# Patient Record
Sex: Male | Born: 1970 | Race: White
Health system: Southern US, Community
[De-identification: ages and names within clinical notes are randomized; demographics above are authoritative.]

## PROBLEM LIST (undated history)

## (undated) DIAGNOSIS — J45909 Unspecified asthma, uncomplicated: Secondary | ICD-10-CM

## (undated) DIAGNOSIS — I10 Essential (primary) hypertension: Secondary | ICD-10-CM

## (undated) DIAGNOSIS — E7800 Pure hypercholesterolemia, unspecified: Secondary | ICD-10-CM

## (undated) DIAGNOSIS — I2510 Atherosclerotic heart disease of native coronary artery without angina pectoris: Secondary | ICD-10-CM

## (undated) DIAGNOSIS — R45851 Suicidal ideations: Secondary | ICD-10-CM

## (undated) DIAGNOSIS — G8929 Other chronic pain: Secondary | ICD-10-CM

## (undated) DIAGNOSIS — E162 Hypoglycemia, unspecified: Secondary | ICD-10-CM

## (undated) DIAGNOSIS — D649 Anemia, unspecified: Secondary | ICD-10-CM

## (undated) DIAGNOSIS — I219 Acute myocardial infarction, unspecified: Secondary | ICD-10-CM

## (undated) DIAGNOSIS — I249 Acute ischemic heart disease, unspecified: Secondary | ICD-10-CM

## (undated) DIAGNOSIS — R011 Cardiac murmur, unspecified: Secondary | ICD-10-CM

## (undated) DIAGNOSIS — R1030 Lower abdominal pain, unspecified: Secondary | ICD-10-CM

## (undated) DIAGNOSIS — R1031 Right lower quadrant pain: Secondary | ICD-10-CM

## (undated) HISTORY — PX: OTHER SURGICAL HISTORY: SHX169

## (undated) HISTORY — PX: ORIF SCAPULAR FRACTURE: SUR958

## (undated) HISTORY — PX: BACK SURGERY: SHX140

## (undated) HISTORY — PX: KNEE ARTHROSCOPY: SHX127

## (undated) HISTORY — PX: OTHER PROCEDURE: U1053

## (undated) HISTORY — PX: BRAIN SURGERY: SHX531

## (undated) MED ORDER — IBUPROFEN 600 MG OR TABS
600.00 mg | ORAL_TABLET | Freq: Once | ORAL | Status: AC
Start: 2013-10-12 — End: 2013-10-12

## (undated) MED ORDER — LISINOPRIL 20 MG OR TABS
20.00 mg | ORAL_TABLET | Freq: Every day | ORAL | Status: AC
Start: 2012-09-14 — End: ?

## (undated) NOTE — ED Provider Notes (Signed)
 Formatting of this note is different from the original.  [name] [name]  [address]  [place] [address]  [phone]    History  Chief Complaint   Patient presents with    Chest Pain     52 year old male presents with a chief complaint of chest pain.  Patient has extensive history of multiple ER visits across the country with similar complaints.  Patient states that he was seen at [facility] earlier in the day for AFib and was cardioverted.  The patient also states recently seen at [facility] as well.  Patient states he has chest pain that goes into his jaw and arm with a history of cardiac disease.  Patient denies shortness of breath.    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    Respiratory:  Negative for shortness of breath.    Cardiovascular:  Positive for chest pain.   Gastrointestinal:  Negative for vomiting.   Psychiatric/Behavioral:  Negative for suicidal ideas.    All other systems reviewed and are negative.    Physical Exam  BP 105/67   Pulse 93   Temp 98.7 F (37.1 C) (Temporal)   Resp 20   Ht 6\' 1"  (1.854 m)   Wt 81.6 kg (180 lb)   SpO2 97%   BMI 23.75 kg/m     Physical Exam  Vitals and nursing note reviewed.   Constitutional:       Appearance: He is well-developed.   HENT:      Head: Normocephalic.   Eyes:      Pupils:  Pupils are equal, round, and reactive to light.   Cardiovascular:      Rate and Rhythm: Normal rate and regular rhythm.      Heart sounds: Normal heart sounds.   Pulmonary:      Effort: Pulmonary effort is normal.      Breath sounds: Normal breath sounds.   Abdominal:      Palpations: Abdomen is soft.      Tenderness: There is no abdominal tenderness.   Musculoskeletal:         General: Normal range of motion.      Cervical back: Normal range of motion and neck supple.   Skin:     General: Skin is warm and dry.      Capillary Refill: Capillary refill takes less than 2 seconds.   Neurological:      General: No focal deficit present.      Mental Status: He  is alert.   Psychiatric:      Comments: Denies suicidal ideation     Results  Labs Reviewed   TROPONIN I - Normal       Result Value    HS Troponin I 7      Narrative:     High-sensitivity Troponin I (hsTnI) assay can reliably detect low troponin concentrations relative to conventional troponin assays. It is the preferred marker of myocardial necrosis as recommended by the Fourth Universal Definition of Myocardial Infarction Guidelines.    The diagnosis of acute myocardial infarction (AMI) is made based on a rise or fall of troponin with at least one measurement exceeding the laboratory's upper limit of normal(indicating myocardial injury), in the context of reasonable suspicion for coronary ischemia (e.g. typical symptoms, changes on ECG, evidence for loss of myocardial function or demonstration of obstructive coronary artery disease).    NOTE: Although abnormal hsTnI values reflect INJURY to myocardial cells, an elevated hsTnI does not indicate the CAUSE of injury (i.e. ischemia versus non-ischemic disease).    In some cases, myocardial injury is chronic and relatively stable such that hsTnI values remain elevated but do not change substantially over hours to days (examples include chronic kidney disease, heart failure or advanced patient age).    When determining  whether there has been a significant rise or fall of troponin on serial sampling, absolute change in troponin concentration has greater diagnostic accuracy for AMI than relative change criteria. A change of >7 ng/L over a 2-hour interval or a change of >10 ng/L over a 3-hour interval is suggested as a significant change.  If the initial hsTnI is below or slightly above the 99th percentile upper reference limit, a 50% change from the baseline is considered significant.  If the initial hsTnI is above the 99th percentile upper reference limit, a 20% change from the baseline value is considered significant.     Imaging  Imaging Results    None      ED Course  ED Course as of 05/22/23 1738   Fri May 22, 2023   1650 After getting consent from the patient I called [name] [name] and spoke with the ER provider who reviewed the patient's record from earlier in the day and states the patient was not cardioverted and was in sinus while he was in the ER although he had a reported history of AFib with a RVR in the field.  Patient had 2- troponins, unremarkable CBC, chemistry and chest x-ray at [facility] earlier today [SC]   1737 Patient reassessed in his sleeping peacefully.  Troponin negative, EKG unremarkable [SC]     ED Course User Index  [SC] [name] Langton, MD     Procedures    MDM  Number of Diagnoses or Management Options  Chest pain  Diagnosis management comments:   Relevant history includes: Refer to HPI and review of systems, chest pain, multiple ER visits recently    Differential diagnosis included:  Chest pain AC'S, malingering    Comorbidities affecting management:  Heart disease, AFib    Social history and social determinants of care that are affecting care:  Orthoptist, substance abuse, cigarette smoking    Prior medical records review and external chart review:  Multiple visits for various complaints throughout the country, visits for suicidal ideation, manipulative behavior at hospital and speak of four days ago  reviewed records from [place], EKG, troponin x2 negative, chemistry, CBC, chest x-ray negative    Independent historian(s) and additional history  obtained from:  EMS    Consultations and case reviewed:  Not applicable    Case discussed with admitting provider:  Not applicable    PMP reviewed with findings:  Not applicable    EKG/rhythm strip reviewed:  Normal sinus rhythm with a rate of 87  EKG/rhythm strip independently reviewed and interpreted by me and shows:  EKG time 4:38 p.m., normal sinus rhythm with a rate 87, normal axis, normal intervals, normal ST segments, no STEMI    Lab results reviewed by me and significant results include:  Troponin negative    Radiology studies independently reviewed and interpreted by me in coordination with radiologist official interpretation:  Not applicable    Considered and unordered tests/meds:  Not applicable    Shared decision making and disposition considerations:    medically and hemodynamically stable at time of disposition. With shared decision making, discussed plan to discharge patient home with medication and follow up with the physicians as noted below. Patient instructed on the importance of ensuring that this follow up occurs as instructed. Patient given verbal instructions at time of discharge in addition to written discharge instructions. Strict ED return precautions given. Patient voiced understanding and agreement with plan. All questions answered.    Multiple visits for chest pain, negative troponin x3 today.  Reviewed the prior records.  Clinically doubt acute coronary syndrome.  Resting comfortably in no distress on reassessment.  Stable for discharge    ED Disposition  Discharge    Final diagnoses:   Chest pain      Follow-up Information       [facility] In 2 days.    Contact information  [address]  [place] [address]  [phone]                      [name] Langton, MD  05/22/23 [phone]    Electronically signed by [name] Langton,  MD at 05/22/2023  3:38 PM PST

## (undated) NOTE — Unmapped External Note (Signed)
 Formatting of this note might be different from the original.  Brought by EMS,[place] patient having chest pain.EMS notice a patch Maximilliano on chest and back which patient have been cardioverted 2 days ago in [place] and found out that patient went [facility] this morning and discharge him which patient did not elaborate.EMS got normal sinus rhythm and given aspirin 324 mg.    Pt complains chest pain radiates to jaw and left arm and rapid heart rate started half hour ago,dizziness and nausea.known 3 heart attack no cardiac stent,stroke 2024.had syncope episode 2 months ago,taking xarelto known afib  Electronically signed by Drema Balzarine, RN at 05/22/2023  2:36 PM PST

---

## 2006-05-13 ENCOUNTER — Emergency Department: Payer: Self-pay

## 2006-05-13 ENCOUNTER — Emergency Department (HOSPITAL_BASED_OUTPATIENT_CLINIC_OR_DEPARTMENT_OTHER): Admitting: Emergency Medicine

## 2006-05-13 ENCOUNTER — Other Ambulatory Visit (INDEPENDENT_AMBULATORY_CARE_PROVIDER_SITE_OTHER): Payer: Self-pay | Admitting: Emergency Medicine

## 2006-05-13 LAB — CBC WITH DIFF, BLOOD
Basophils: 1 % (ref 0–2)
Eosinophils: 2 % (ref 1–3)
Hct: 42.2 % (ref 40.0–50.0)
Hgb: 14.6 gm/dL (ref 14.0–17.0)
Lymphocytes: 28 % (ref 20–40)
MCH: 30.5 pg (ref 27–31)
MCHC: 34.6 % (ref 32–37)
MCV: 88.2 um3 (ref 82.0–98.0)
MPV: 8.2 fl (ref 7.4–10.4)
Monocytes: 9 % (ref 1–10)
Plt Count: 292 10*3/uL (ref 130–400)
RBC: 4.78 10*6/uL (ref 4.70–5.70)
RDW: 14.4 % (ref 10–15)
Segs: 60 % (ref 45–70)
WBC: 8 10*3/uL (ref 4.0–11.0)

## 2006-05-13 LAB — TROPONIN I (THORNTON), BLOOD: Troponin I (TH): <0.2 ng/mL (ref <0.5)

## 2006-05-13 LAB — COMPREHENSIVE METABOLIC PANEL, BLOOD
ALT (SGPT): 20 IU/L (ref 10–45)
AST (SGOT): 17 IU/L (ref 10–45)
Albumin: 3.8 gm/dL (ref 3.3–5.0)
Alkaline Phos: 76 IU/L (ref 30–130)
BUN: 20 mg/dL — ABNORMAL HIGH (ref 8–18)
Bicarbonate: 26 mEq/L (ref 24–31)
Bilirubin, Tot: 0.7 mg/dL (ref <1.2)
Calcium: 9 mg/dL (ref 8.8–10.3)
Chloride: 105 mEq/L (ref 97–107)
Creatinine: 0.9 mg/dL (ref 0.5–1.5)
GFR (African Amer.): >60 mL/min
GFR: >60 mL/min
Glucose: 102 mg/dL (ref 65–110)
Potassium: 3.9 mEq/L (ref 3.5–5.0)
Sodium: 143 mEq/L (ref 135–145)
Total Protein: 6 gm/dL (ref 6.0–8.0)

## 2006-05-13 LAB — PROTHROMBIN TIME, BLOOD
INR: 1.2
PT,Patient: 10.5 s — ABNORMAL HIGH (ref 7.0–10.0)

## 2006-05-13 LAB — CPK-CREATINE PHOSPHOKINASE, BLOOD: CPK: 168 IU/L (ref 0–175)

## 2006-05-13 LAB — APTT, BLOOD: PTT: 32.8 s (ref 25.0–34.0)

## 2006-05-13 LAB — CK-MB (THORNTON), BLOOD: CK-MB: 2.8 ng/mL (ref ?–6.3)

## 2006-05-15 LAB — ECG, COMPLETE (HC/~~LOC~~/ENCINITAS)
ATRIAL RATE: 80 {beats}/min
ECG INTERPRETATION: NORMAL
P AXIS: 71 degrees
PR INTERVAL: 150 ms
QRS INTERVAL/DURATION: 100 ms
QT: 370 ms
QTC INTERVAL: 427 ms
R AXIS: 63 degrees
T AXIS: 12 degrees
VENTRICULAR RATE: 80 {beats}/min

## 2007-01-12 ENCOUNTER — Observation Stay
Admission: EM | Admit: 2007-01-12 | Discharge status: Against Medical Advice | Payer: Self-pay | Source: Emergency Department | Admitting: Internal Medicine

## 2007-01-12 LAB — I-STAT TROPONIN CERNER: i-STAT Troponin: 0.01 ng/mL

## 2007-01-12 LAB — CBC WITH AUTO DIFFERENTIAL CERNER
Basophils Absolute: 0.1 /mm3 (ref 0.0–0.2)
Basophils: 1 % (ref 0–2)
Eosinophils Absolute: 0.2 /mm3 (ref 0.0–0.7)
Eosinophils: 2 % (ref 0–5)
Granulocytes Absolute: 4.9 /mm3 (ref 1.8–8.1)
Hematocrit: 41.2 % — ABNORMAL LOW (ref 42.0–52.0)
Hgb: 14.4 G/DL (ref 13.0–17.0)
Lymphocytes Absolute: 1.6 /mm3 (ref 0.5–4.4)
Lymphocytes: 22 % (ref 15–41)
MCH: 30.5 PG (ref 28.0–32.0)
MCHC: 35 G/DL (ref 32.0–36.0)
MCV: 87.2 FL (ref 80.0–100.0)
MPV: 7.8 FL (ref 7.4–10.4)
Monocytes Absolute: 0.6 /mm3 (ref 0.0–1.2)
Monocytes: 9 % (ref 0–11)
Neutrophils %: 66 % (ref 52–75)
Platelets: 289 /mm3 (ref 140–400)
RBC: 4.73 /mm3 (ref 4.70–6.00)
RDW: 13.4 % (ref 11.5–15.0)
WBC: 7.5 /mm3 (ref 3.5–10.8)

## 2007-01-12 LAB — COMPREHENSIVE METABOLIC PANEL
ALT: 26 U/L (ref 3–36)
AST (SGOT): 20 U/L (ref 10–41)
Albumin/Globulin Ratio: 1.8 (ref 1.1–1.8)
Albumin: 4.1 g/dL (ref 3.4–4.9)
Alkaline Phosphatase: 89 U/L (ref 43–112)
BUN: 15 mg/dL (ref 8–20)
Bilirubin, Total: 0.1 mg/dL (ref 0.1–1.0)
CO2: 23 mEq/L (ref 21–30)
Calcium: 9 mg/dL (ref 8.6–10.2)
Chloride: 107 mEq/L (ref 98–107)
Creatinine: 0.9 mg/dL (ref 0.6–1.5)
Globulin: 2.3 g/dL (ref 2.0–3.7)
Glucose: 96 mg/dL (ref 70–100)
Potassium: 4.2 mEq/L (ref 3.6–5.0)
Protein, Total: 6.4 g/dL (ref 6.0–8.0)
Sodium: 140 mEq/L (ref 136–146)

## 2007-01-12 LAB — PT AND APTT
PT INR: 1.2 {INR} — ABNORMAL HIGH (ref 0.9–1.1)
PT: 13.6 s — ABNORMAL HIGH (ref 10.8–13.3)
PTT: 30 s (ref 21–32)

## 2007-01-12 LAB — TROPONIN I QUANTITATIVE LEVEL CERNER: Troponin I: 0 ng/mL

## 2007-01-12 LAB — AMYLASE: Amylase: 32 U/L (ref 5–90)

## 2007-01-12 LAB — CKMB
CKMB Confirm: 4 ng/mL (ref 0–5)
CKMB Confirm: 4 ng/mL (ref 0–5)

## 2007-01-12 LAB — CK
Creatine Kinase (CK): 249 U/L (ref 20–297)
Creatine Kinase (CK): 221 U/L (ref 20–297)

## 2007-01-12 LAB — GFR

## 2007-01-12 LAB — LIPASE: Lipase: 110 U/L (ref 32–219)

## 2007-01-13 ENCOUNTER — Emergency Department
Admission: EM | Admit: 2007-01-13 | Discharge status: Against Medical Advice | Payer: Self-pay | Source: Emergency Department | Admitting: Emergency Medicine

## 2007-01-13 LAB — URINALYSIS WITH MICROSCOPIC
Bilirubin, UA: NEGATIVE
Blood, UA: NEGATIVE
Glucose, UA: NEGATIVE
Ketones UA: NEGATIVE
Leukocyte Esterase, UA: NEGATIVE
Nitrite, UA: NEGATIVE
Protein, UR: NEGATIVE
Specific Gravity UA POCT: 1.022 (ref 1.001–1.035)
Urine pH: 7 (ref 5.0–8.0)
Urobilinogen, UA: 0.2

## 2007-01-13 LAB — CK: Creatine Kinase (CK): 172 U/L (ref 20–297)

## 2007-01-13 LAB — RAPID DRUG SCREEN, URINE: Opiate Screen, UR: POSITIVE

## 2007-01-13 LAB — TROPONIN I QUANTITATIVE LEVEL CERNER: Troponin I: 0 ng/mL

## 2007-01-14 LAB — BASIC METABOLIC PANEL
BUN / Creatinine Ratio: 20 (ref 8–20)
BUN: 19 mg/dL (ref 6–20)
CO2: 24 mmol/L (ref 21.0–31.0)
Calcium: 9.8 mg/dL (ref 8.4–10.2)
Chloride: 101 mmol/L (ref 101–111)
Creatinine: 0.96 mg/dL (ref 0.5–1.4)
EGFR: >60 mL/min/{1.73_m2}
EGFR: >60 mL/min/{1.73_m2}
Glucose: 89 mg/dL (ref 70–100)
Potassium: 4.3 mmol/L (ref 3.6–5.0)
Sodium: 140 mmol/L (ref 135–145)

## 2007-01-14 LAB — D-DIMER, QUANTITATIVE: D-Dimer, Quant.: 329 ng/mL

## 2007-01-14 LAB — ^CBC WITH DIFF MCKESSON
BASOPHILS %: 0.8 % (ref 0–2)
Baso(Absolute): 0.1
Eosinophils %: 2.4 % (ref 0–6)
Eosinophils Absolute: 0.2
Hematocrit: 46.3 % (ref 39.0–49.0)
Hemoglobin: 15.8 g/dL (ref 13.2–17.3)
Lymphocytes Absolute: 2.5
Lymphocytes Relative: 24.9 % — ABNORMAL LOW (ref 25–55)
MCH: 30 pg (ref 27.0–34.0)
MCHC: 34.1 % (ref 32.0–36.0)
MCV: 88 fL (ref 80–100)
Monocytes Absolute: 0.8
Monocytes Relative %: 7.6 % (ref 1–8)
Neutrophils Absolute: 6.3
Neutrophils Relative %: 64.3 % (ref 49–69)
Platelets: 319 10*3/uL (ref 150–400)
RBC: 5.26 /mm3 (ref 3.80–5.40)
RDW: 12.4 % (ref 11.0–14.0)
WBC: 9.9 10*3/uL (ref 4.8–10.8)

## 2007-01-14 LAB — TOXICOLOGY SCREEN, SERUM
Barbiturate Screen, UR: NEGATIVE
Benzodiazepine Screen, UR: NEGATIVE
Cocaine Screen: NEGATIVE
Opiate Screen: POSITIVE
PCP Screen: NEGATIVE
THC Screen: NEGATIVE
Urine Amphetamine Screen: NEGATIVE

## 2007-01-14 LAB — HEPATIC FUNCTION PANEL
ALT: 30 U/L (ref 7–56)
AST (SGOT): 22 U/L (ref 5–40)
Albumin, Synovial: 4.9 g/dL (ref 3.9–5.0)
Alkaline Phosphatase: 104 U/L (ref 38–126)
Bilirubin Direct: 0 mg/dL (ref 0.0–0.4)
Bilirubin, Total: 0.3 mg/dL (ref 0.2–1.3)
Protein, Total: 7.3 g/dL (ref 6.3–8.2)

## 2007-01-14 LAB — TROPONIN I: Troponin I: <0.01 ng/mL (ref 0.000–0.034)

## 2007-01-14 LAB — MAGNESIUM: Magnesium: 1.9 mg/dL (ref 1.7–2.2)

## 2007-01-14 LAB — ^CKMB MCKESSON
CKMB Index: 1.2 % (ref 0.0–2.5)
CKMB Mass: 2.92 ng/mL (ref 0.00–3.38)

## 2007-01-14 LAB — LIPASE: Lipase: 86 U/L (ref 23–300)

## 2007-01-14 LAB — PT (PROTHROMBIN TIME)
PT INR: 1
PT: 11.7 s (ref 10.6–12.8)

## 2007-01-14 LAB — MYOGLOBIN, SERUM: Myoglobin: 42 ng/mL (ref 0–121)

## 2007-01-14 LAB — PTT (PARTIAL THROMBOPLASTIN TIME)
PTT Ratio: 1.1 (ref 0.8–1.2)
PTT: 30.1 s (ref 21.6–34.0)

## 2007-01-14 LAB — CK: Creatine Kinase (CK): 248 U/L — ABNORMAL HIGH (ref 19–216)

## 2008-03-25 ENCOUNTER — Observation Stay
Admission: EM | Admit: 2008-03-25 | Discharge status: Against Medical Advice | Payer: Self-pay | Source: Ambulatory Visit | Admitting: Internal Medicine

## 2010-04-18 ENCOUNTER — Inpatient Hospital Stay
Admit: 2010-04-19 | Disposition: A | Discharge status: Other Acute Inpatient Hospital | Admitting: Cardiovascular Disease

## 2010-04-19 LAB — BASIC METABOLIC PANEL
BUN: 14 mg/dl (ref 7–22)
BUN: 13 mg/dl (ref 7–22)
CO2: 26 meq/l (ref 23–33)
CO2: 23 meq/l (ref 23–33)
Calcium: 9.5 mg/dl (ref 8.5–10.5)
Calcium: 8.7 mg/dl (ref 8.5–10.5)
Chloride: 107 meq/l (ref 98–111)
Chloride: 111 meq/l (ref 98–111)
Creatinine: 0.9 mg/dl (ref 0.4–1.2)
Creatinine: 0.7 mg/dl (ref 0.4–1.2)
Glucose: 129 mg/dl — ABNORMAL HIGH (ref 70–108)
Glucose: 109 mg/dl — ABNORMAL HIGH (ref 70–108)
Potassium: 3.7 meq/l (ref 3.5–5.2)
Potassium: 4 meq/l (ref 3.5–5.2)
Sodium: 138 meq/l (ref 135–145)
Sodium: 139 meq/l (ref 135–145)

## 2010-04-19 LAB — ANION GAP: Anion Gap: 8.7 — ABNORMAL LOW (ref 10.0–20.0)

## 2010-04-19 LAB — CBC
Basophils: 0.4 % (ref 0.0–3.0)
Basophils: 0.7 % (ref 0.0–3.0)
Eosinophils: 1.5 % (ref 0.0–4.0)
Eosinophils: 2.3 % (ref 0.0–4.0)
Hematocrit: 38.7 % — ABNORMAL LOW (ref 42.0–52.0)
Hematocrit: 42.4 % (ref 42.0–52.0)
Hemoglobin: 13.2 gm/dl — ABNORMAL LOW (ref 14.0–18.0)
Hemoglobin: 14.5 gm/dl (ref 14.0–18.0)
Lymphocytes: 20.3 % (ref 15.0–47.0)
Lymphocytes: 30.2 % (ref 15.0–47.0)
MCH: 30.7 pg (ref 27.0–31.0)
MCH: 30.9 pg (ref 27.0–31.0)
MCHC: 34.1 gm/dl (ref 33.0–37.0)
MCHC: 34.2 gm/dl (ref 33.0–37.0)
MCV: 89.9 fL (ref 80.0–94.0)
MCV: 90.3 fL (ref 80.0–94.0)
MPV: 7.4 mcm (ref 7.4–10.4)
MPV: 8.5 mcm (ref 7.4–10.4)
Monocytes: 7.4 % (ref 0.0–12.0)
Monocytes: 9.6 % (ref 0.0–12.0)
Nucleated Red Blood Cells: 0 /100 wbc
Nucleated Red Blood Cells: 0 /100 wbc
Platelets: 236 10*3/uL (ref 130–400)
Platelets: 271 10*3/uL (ref 130–400)
RBC Morphology: NORMAL
RBC Morphology: NORMAL
RBC: 4.28 10*6/uL — ABNORMAL LOW (ref 4.70–6.10)
RBC: 4.72 10*6/uL (ref 4.70–6.10)
RDW: 14.3 % (ref 11.5–14.5)
RDW: 14.6 % — ABNORMAL HIGH (ref 11.5–14.5)
Seg Neutrophils: 57.2 % (ref 43.0–75.0)
Seg Neutrophils: 70.4 % (ref 43.0–75.0)
WBC: 7.6 10*3/uL (ref 4.8–10.8)
WBC: 8.7 10*3/uL (ref 4.8–10.8)

## 2010-04-19 LAB — CK WITH REFLEX CK-MB: Total CK: 472 U/L — ABNORMAL HIGH (ref 55–170)

## 2010-04-19 LAB — HEPATIC FUNCTION PANEL
ALT: 28 U/L (ref 11–66)
AST: 36 U/L (ref 12–45)
Albumin: 4.7 gm/dl (ref 3.5–5.1)
Alkaline Phosphatase: 100 U/L (ref 38–126)
Bilirubin, Direct: 0.1 mg/dl (ref 0.0–0.3)
Bilirubin, Total: 0.3 mg/dl (ref 0.3–1.2)
Total Protein: 7.1 gm/dl (ref 6.1–8.0)

## 2010-04-19 LAB — LIPID PANEL
Cholesterol, Total: 110 mg/dl (ref 100–199)
HDL: 28 mg/dl
LDL Calculated: 65 mg/dl
Triglycerides: 84 mg/dl (ref 0–199)

## 2010-04-19 LAB — TROPONIN
Troponin I: <0.006 ng/ml
Troponin I: <0.006 ng/ml (ref 0.000–0.039)

## 2010-04-19 LAB — OSMOLALITY: Osmolality Calc: 277.8 mOsmol/kg (ref 275.0–300.0)

## 2010-04-19 LAB — CK-MB
CK-MB: 11.4 ng/ml — ABNORMAL HIGH (ref 0.0–6.5)
Relative Index: 2.42 (ref <3.3)

## 2010-04-19 LAB — PROTHROMBIN TIME (PT): INR: 1.11 (ref 0.85–1.13)

## 2010-04-19 LAB — GLOMERULAR FILTRATION RATE, ESTIMATED
Est, Glom Filt Rate: >90 mL/min/{1.73_m2}
Est, Glom Filt Rate: >90 mL/min/{1.73_m2}

## 2010-08-16 MED ORDER — OXYCODONE-ACETAMINOPHEN 5 MG-325 MG TABLET
5-325 | Freq: Once | ORAL | Status: AC
Start: 2010-08-16 — End: 2010-08-16

## 2010-12-18 NOTE — Progress Notes (Signed)
(NAME)             [name], [name]  (DATE OF BIRTH)    04-02-1970  (ADMIT DATE)       01/13/2007  (MR NUMBER)        [id number]  (ACCT NUMBER)      [id number]  (ROOM NUMBER)      ERC  (PHYSICIAN)        Ronda Fairly. Mathews Robinsons, M.D.    [facility]  EMERGENCY REPORT      ADDENDUM TO EMERGENCY DEPARTMENT T-SHEET    [name] [name] was seen in the emergency department for chest pressure and  chest pain.  He is a very difficult historian with regards to getting  accurate information.  Initially he said that he was just discharged from [facility] this morning for chest pain to the paramedics.  Upon arrival   here he told the nurse that he was just discharged from a hospital in [facility] and then when I spoke to him he said that he was discharged from a   hospital in [place].  As it turns out the latter was the correct answer.  He   said that his cardiac enzymes were elevated and they told him that he had acute   coronary syndrome at [facility] today and he decided to leave   against medical advice this morning, because he wanted go to [place] to do   other things.  I spoke to a physician who is a partner of the physician that   took care of him at [facility] and also had a faxed report of his   labs including all his troponins, cardiac enzymes and they were all normal in   fact at [facility]. They did not have a dictation summary or an EKG  available for my review at that time.  I spoke with the patient regarding  the cardiac enzymes to reassure him that those cardiac enzymes, at least at  [facility], were normal and that our first set of cardiac  enzymes were normal and that my plan was to admit him to the hospital to  get a stress test in the morning.  He then informed me that he did not have  a place to live or stay and that he was in fact on his way to [place] to  try to procure work through [employer].  He also told  me  that he did not think that this was his heart because of the cardiac  enzymes were normal.  I told him that those were limited with respect to  partial blockages and other heart problems that can occur.  He declined  this.  I offered him Toradol as well as Maalox, Pepcid for his pain and  nitroglycerin.  He declined the nitroglycerin because he was sure it was  not his heart and asked for viscus lidocaine because the last time he had  pain like this that made it go away.  He had not mentioned this previous  episode to me prior to that moment, which was late in his care here at  [facility].  He then also said that he was  [facility] because he had bipolar  affective disorder and was schizophrenic and he also neglected to tell me  that portion of the history until just before  his departure.  I told him  that his d-dimer was elevated and he had told me that he had been on a long  trip recently on a train, and that it was important to also evaluate him  for a pulmonary embolism, a blood clot in his lung.  He said for personal  reasons he would prefer not to get a chest CT to rule him out for a  pulmonary embolism and in fact, did not want to be treated with anything  other than the Toradol, Pepcid and Maalox declining the nitroglycerin, the  chest CT as well as an admission.  I asked him what his plans were when he  left and he said to go to an all night restaurant until the morning and  then to hitch-hike to [place].  I told him that it was not a very busy  night here at [facility] and he is welcome to  stay in one of the rooms for the rest of the night until the morning and at  that time if he still decided that he wanted to go home and not have the  studies done or any other medicine, then that was fine, it was his choice  if he would like to sign out against medical advice, realizing the risks  and benefits of missing  a heart attack and/or a blood clot in his lung  which can include permanent disability or death.  He said that he does  understand that and he doesn't think it is those things and he thinks it is  very bad indigestion and would like to leave and did not want to stay out of   the cold in the [facility] in one of the rooms   overnight; that he would rather go to a restaurant.  I told him to just give me   a few minutes and I would get his paperwork ready if he did want to leave.  He   seemed to be in a very big hurry at that point to leave. He had his clothes on   and walked over to the nursing station several times in an effort to leave just   as soon as possible.  He did, however, wait for some discharge instructions and   I encouraged him to please come back to our hospital or any other hospital   immediately if anything got worse.  He said his pain was 0.5 on the 10 scale   when he left against medical advice.      Ronda Fairly. Mathews Robinsons, M.D.  [facility]  D:  01/13/2007 11:27 P  T:  01/14/2007 10:10 A  Job #:  [id number]  Doc #:  [id number]  cc:  Authenticated and Edited by Christoper Fabian, MD On 01/15/07 3:25:55 PM

## 2010-12-26 LAB — ECG 12-LEAD
Atrial Rate: 55 {beats}/min
Atrial Rate: 68 {beats}/min
Atrial Rate: 70 {beats}/min
P Axis: 119 degrees
P Axis: 52 degrees
P Axis: 58 degrees
P-R Interval: 134 ms
P-R Interval: 152 ms
P-R Interval: 160 ms
Q-T Interval: 374 ms
Q-T Interval: 388 ms
Q-T Interval: 394 ms
QRS Duration: 102 ms
QRS Duration: 106 ms
QRS Duration: 108 ms
QTC Calculation (Bezet): 371 ms
QTC Calculation (Bezet): 403 ms
QTC Calculation (Bezet): 418 ms
R Axis: 120 degrees
R Axis: 48 degrees
R Axis: 49 degrees
T Axis: 15 degrees
T Axis: 166 degrees
T Axis: 26 degrees
Ventricular Rate: 55 {beats}/min
Ventricular Rate: 68 {beats}/min
Ventricular Rate: 70 {beats}/min

## 2011-04-04 ENCOUNTER — Emergency Department: Admit: 2011-04-04 | Attending: Cardiovascular Disease | Admitting: Cardiovascular Disease

## 2011-04-04 ENCOUNTER — Encounter: Payer: Self-pay | Admitting: INTERNAL MEDICINE

## 2011-04-04 HISTORY — DX: Hypoglycemia, unspecified: E16.2

## 2011-04-04 HISTORY — DX: Other chronic pain: G89.29

## 2011-04-04 MED ORDER — ASPIRIN 325 MG TABLET
325.0000 mg | ORAL_TABLET | Freq: Every day | ORAL | Status: DC
Start: 2011-04-05 — End: 2011-04-05

## 2011-04-04 MED ORDER — METOPROLOL TARTRATE 25 MG TABLET
12.5000 mg | ORAL_TABLET | Freq: Two times a day (BID) | ORAL | Status: DC
Start: 2011-04-05 — End: 2011-04-05

## 2011-04-04 MED ORDER — NACL 0.9% IV BOLUS - DURATION REQ
1000.0000 mL | Freq: Once | INTRAVENOUS | Status: AC
Start: 2011-04-04 — End: 2011-04-04
  Administered 2011-04-04: 1000 mL via INTRAVENOUS

## 2011-04-04 MED ORDER — NITROGLYCERIN 0.4 MG SUBLINGUAL TABLET
0.4000 mg | SUBLINGUAL_TABLET | SUBLINGUAL | Status: DC | PRN
Start: 2011-04-04 — End: 2011-04-05

## 2011-04-04 MED ORDER — NACL 0.9% IV BOLUS - DURATION REQ
1000.0000 mL | Freq: Once | INTRAVENOUS | Status: AC
Start: 2011-04-05 — End: 2011-04-04
  Administered 2011-04-04: 1000 mL via INTRAVENOUS

## 2011-04-04 MED ORDER — ONDANSETRON HCL (PF) 4 MG/2 ML INJECTION SOLUTION
4.0000 mg | Freq: Two times a day (BID) | INTRAMUSCULAR | Status: DC | PRN
Start: 2011-04-04 — End: 2011-04-05
  Administered 2011-04-04: 4 mg via INTRAVENOUS
  Filled 2011-04-04: qty 2

## 2011-04-04 MED ORDER — NITROGLYCERIN 0.4 MG SUBLINGUAL TABLET
0.4000 mg | SUBLINGUAL_TABLET | Freq: Once | SUBLINGUAL | Status: AC
Start: 2011-04-04 — End: 2011-04-04
  Administered 2011-04-04: 0.4 mg via SUBLINGUAL
  Filled 2011-04-04: qty 25

## 2011-04-04 MED ORDER — HEPARIN, PORCINE (PF) 5,000 UNIT/0.5 ML INJECTION SYRINGE
5000.0000 [IU] | INJECTION | Freq: Three times a day (TID) | INTRAMUSCULAR | Status: DC
Start: 2011-04-05 — End: 2011-04-05
  Administered 2011-04-05: 5000 [IU] via SUBCUTANEOUS
  Filled 2011-04-04: qty 1

## 2011-04-04 MED ORDER — ATORVASTATIN 10 MG TABLET
20.0000 mg | ORAL_TABLET | Freq: Every day | ORAL | Status: DC
Start: 2011-04-05 — End: 2011-04-05

## 2011-04-04 MED ORDER — HYDROMORPHONE (PF) 1 MG/ML INJECTION SYRINGE
0.4000 mg | INJECTION | INTRAMUSCULAR | Status: DC | PRN
Start: 2011-04-04 — End: 2011-04-05
  Administered 2011-04-04 – 2011-04-05 (×4): 1 mg via INTRAVENOUS
  Filled 2011-04-04 (×4): qty 1

## 2011-04-04 MED ORDER — MORPHINE 2 MG/ML INJECTION SYRINGE
2.0000 mg | INJECTION | INTRAMUSCULAR | Status: DC | PRN
Start: 2011-04-04 — End: 2011-04-04
  Administered 2011-04-04: 4 mg via INTRAVENOUS
  Filled 2011-04-04: qty 1

## 2011-04-04 NOTE — ED Triage Note (Signed)
l sided chest pain, pressure, radiate to l neck/ue, +sob/nausea w/ pain, no relief w/ ntg/asa, speaks full sentences, no resp distress

## 2011-04-04 NOTE — ED Nursing Note (Signed)
Pt moved to a pod, report given to sheree

## 2011-04-04 NOTE — ED Nursing Note (Signed)
Received reprot at [facility] from [facility]. Chaplain has been paged twice for pt.

## 2011-04-04 NOTE — ED Initial Note (Signed)
Received pt from [place], BP low, NS bolus initiated. C/o pain 8/10 cp radiates to shoulder and L arm. No SOB or dizziness at this time. Sats wnl.     Delford Field, RN

## 2011-04-04 NOTE — ED Nursing Note (Signed)
Pt is sitting up in gurney awake and alert, GCS 15, Tele ST , febrile, otherwise VSS. Pt reporting ongoing L CP that radiates to L neck, L shoulder and L arm. 9/10 CP that started 3 hours ago while pt walking. Placed on monitor, PIV placed and labs collected. MD at [facility], await orders.

## 2011-04-04 NOTE — ED Nursing Note (Signed)
Back from CXR, GCS 15.

## 2011-04-04 NOTE — ED Nursing Note (Signed)
Pt reports pain in L side of chest, pressure at 6/10. Denies nausea currently. Pt is anxious and repeatedly asking for pain meds. MD at [facility]. Lungs clear, MAE, aox4 on monitorx3 in d3,. 2nd trop drawn

## 2011-04-04 NOTE — ED Nursing Note (Signed)
Pt frequently calling for nurse for pillow, to check lead placement, to get ice. Pain is decreased by pain meds, no n/v now, pt would like to eat, awaiting orders.

## 2011-04-04 NOTE — ED Initial Note (Signed)
EMERGENCY DEPARTMENT PHYSICIAN NOTE - Nicholas Krueger       Date of Service:   04/04/2011  2:59 PM Patient's PCP: No Pcp No Pcp   Note Started: 04/04/2011 5:21 PM DOB: May 16, 1970             Chief Complaint   Patient presents with    Chest Pain, Active Cardiac Features           The history provided by the patient.  Interpreter used: No    Nicholas Krueger is a 41yr old homeless male, with a past medical history significant for hypoglycemia, chronic back pain, who was BIBA with a chief complaint of chest pain that began a few hours prior to ER visit.  Patient reports walking to work, walked several yards, then collapsed c/o crushing substernal chest pain.  Chest pain is described as pressure like, radiating to left chest, arm, neck, 9/10 intensity.  Associated SOB, lightheadedness, dizziness, nausea, and palpitations.   Reports of similar episodes of chest pain that occur once every 2 weeks while ambulating.  He takes 3-4 sublingual NTG which resolves his pain.  This episode is more intense.  He reports of an episode of intense chest pain, located substernal with radiation to left arm and neck that occurred 6 months ago.  He was evaluated at an OSH with negative EKG and cardiac markers.  Given his family history, patient was offered a stress test, however patient "got scared" and declined; he was discharged home.      Denies any syncope or LOC.  Denies any fevers, chills, vomiting, abdominal pain, diarrhea, or dysuria.  Denies any PND, orthopnea, LE edema.  Reports of bilateral thigh pain, however no erythema, asymmetric swelling, or edema.  No previous DVT or PE history.  No history of hypercoagulable state.  Denies any IVDU, however smokes marijuana once per week.  Quit drinking alcohol 1 year ago, 1 beer every 3-4 weeks, smokes 1/2 ppd for 20 years.      A full history, including pertinent past medical, family and social history was reviewed.    HISTORY:  There are no hospital problems to display for this patient.    Allergies   Allergen Reactions    Toradol (Ketorolac Tromethamine) Nausea/Vomiting    Tramadol Nausea/Vomiting      Past Medical History:    Hypoglycemia                                                  Chronic pain                                               History reviewed. No pertinent surgical history.   Social History    Marital Status: SINGLE              Spouse Name:                       Years of Education:                 Number of children:               Occupational History    None on file  Social History Main Topics    Smoking Status: Not on file                       Smokeless Status: Not on file                       Alcohol Use:                 Drug Use:                 Sexual Activity: Not on file          Other Topics            Concern    None on file    Social History Narrative    None on file     No family history on file.             Review of Systems   Constitutional: Negative for fever, chills, weight loss, malaise/fatigue and diaphoresis.   HENT: Negative for ear pain, congestion and sore throat.    Eyes: Negative.    Respiratory: Negative for cough, shortness of breath and wheezing.    Cardiovascular: Positive for chest pain. Negative for orthopnea and PND.   Gastrointestinal: Positive for nausea and blood in stool. Negative for heartburn, vomiting, abdominal pain, diarrhea and constipation.   Genitourinary: Negative.    Musculoskeletal: Positive for myalgias.   Skin: Negative.    Neurological: Negative.  Negative for headaches.   Endo/Heme/Allergies: Negative.    Psychiatric/Behavioral: Negative.    All other systems reviewed and are negative.        TRIAGE VITAL SIGNS:  Temp: 38 C (100.4 F) (04/04/11 1459)  Temp src: Oral (04/04/11 1459)  Pulse: 113  (04/04/11 1459)  BP: 109/55 mmHg (04/04/11 1459)  Resp: 16  (04/04/11 1459)  SpO2: 98 % (04/04/11 1459)  Weight: (not recorded)    Physical Exam   Nursing note and vitals reviewed.  Constitutional: He is oriented to person, place, and  time. He appears well-developed and well-nourished. No distress.   HENT:   Head: Normocephalic and atraumatic.   Mouth/Throat: No oropharyngeal exudate.        Dry mucous membranes   Eyes: Conjunctivae and EOM are normal. Pupils are equal, round, and reactive to light.   Neck: Normal range of motion. Neck supple. No JVD present.   Cardiovascular: Normal heart sounds.    No murmur heard.       Tachycardic rate, normal rhythm, no murmurs, rubs, gallops   Pulmonary/Chest: Effort normal and breath sounds normal. He has no wheezes. He has no rales.        Clear to ascultation   Abdominal: Soft. Bowel sounds are normal. He exhibits no mass. There is no tenderness. There is no guarding.   Musculoskeletal: Normal range of motion. He exhibits no edema and no tenderness.   Lymphadenopathy:     He has no cervical adenopathy.   Neurological: He is alert and oriented to person, place, and time. No cranial nerve deficit.   Skin: Skin is warm. He is not diaphoretic.   Psychiatric: He has a normal mood and affect. His behavior is normal. Judgment and thought content normal.           INITIAL ASSESSMENT & PLAN, MEDICAL DECISION MAKING, ED COURSE:  Nicholas Krueger is a 41yr old male who presents with a chief complaint of chest pain. After history  and exam, I am most concerned for ACS vs. pericarditis. Differential diagnosis includes, but is not limited to ACS vs. PE vs. PNA vs. Pericarditis vs. Aortic dissection vs. MSK.  He received ASA 325 mg PO on route.  The patient is hemodynamically stable and will require IVF, EKG, cardiac markers, dilaudid, NTG as an immediate intervention. Initial treatment and studies to evaluate this problem will include serial exams, labs, imaging, EKG, analgesia as needed and antiemetics as needed. The eventual disposition will depend on the results of studies and patient's clinical course.    Patient presents with substernal chest pain with radiation to left arm, neck, face, jaw.  Family history positive  for MI in father at the age of 76.  TIMI score 1.  No EKG changes, however no baseline to compare.  Patient is tachycardic, regular rhythm, with slight low grade temperature.  No viral prodrome.  Well's criteria is 1.5 (tachycardia, but no malignancy, hemoptysis, immobility).  With elevated D-dimer, we will order a CT chest with contrast to r/o PE.  Due to symptoms and risk factors, patient will require further risk stratification; will likely require CCU admission.      ED Course Update: The patient was observed in the ED.  The patient's symptoms and clinical condition stabilized. The results of the ED evaluation were notable for the following:    Pertinent lab results (reviewed and interpreted independently by me):   CBC: WBC 9.2, Hgb 12.9, Hct 38.5, plt 272  BMP: normal  D-dimer: 262 (elevated)  Troponin: < 0.01 x2   UA: trace LE, 3 WBC, no nitrites  Utox: negative  ETOH serum: negative    Pertinent imaging results (reviewed and interpreted independently by me):   CXR: prelim read: no acute cardiopulmonary process    CT chest with contrast: no acute PE (prelim)    EKG (reviewed and interpreted independently by me): The rhythm is tachycardic, sinus, rate 105, normal axis, normal intervals, no ST or T wave changes, no previous to compare.      Pertinent medications:   NTG 0.4 mg sublingual X 1  1L NS bolus X 1  Dilaudid 0.4-2 mg IV q1 PRN pain  ASA 325 mg PO X 1 (given on route)      Further MDM  Additional data reviewed? N/A      Other ED Course Details:     Over the patient's course in the Emergency Department I performed repeated reassessments to assess clinical condition and response to therapy. This included interval histories and focused re-examinations. I reviewed the patient's course on multiple occasions with the housestaff.     I personally reviewed and interpreted the laboratory studies, visualized and interpreted the imaging studies and incorporated these findings into my overall medical decision  making. The findings of the diagnostic studies performed during this visit were discussed in detail with the patient or their designated representative whenever possible.     Patient remained clinically stable throughout their emergency department stay. Patient improved with the above noted therapies. His initial laboratory studies were significant for a + d-dimer, but subsequent CT showed no acute PE. He did have a mild fever on exam, but no obvious source. His fever resolved. Given his chest pain and risk factors, I think patient warrants risk stratification. CCU was consulted and will admit the patient for further management.       MDM COMPLEXITY  Overall Complexity of MDM is: moderate     LAST VITAL SIGNS:  Temp: 38  C (100.4 F) (04/04/11 1705)  Temp src: Oral (04/04/11 1705)  Pulse: 109  (04/04/11 1705)  BP: 138/78 mmHg (04/04/11 1705)  Resp: 14  (04/04/11 1705)  SpO2: 98 % (04/04/11 1705)  Weight: (not recorded)      Disposition: Based on this diagnosis, the patient will be admitted. Anticipate they will need further workup for this problem, which includes risk stratification.      Clinical Impression: chest pain        PRESENT ON ADMISSION:  Are any of the following four conditions present or suspected on admission: decubitus ulcer, infection from an intravascular device, infection due to an indwelling catheter, surgical site infection or pneumonia? No.    PATIENT'S GENERAL CONDITION:  Fair: Vital signs are stable and within normal limits. Patient is conscious but may be uncomfortable. Indicators are favorable.      Report electronically signed by:  Brett Fairy, MD Resident      This patient was seen, evaluated, and care plan was developed with the resident.  I agree with the findings and plan as outlined in our combined note.      Roney Mans

## 2011-04-04 NOTE — Allied Health Progress (Signed)
Pastoral Care Progress Note                                       Spiritual Assessment Form    Chaplain:  C. Heman Que Hoy-Bianchi  Date of Note:  04/04/2011   Patient Name:  Nicholas Krueger Unit:  [facility]3    HRT   Faith Affiliation/Tradition:  n/a Age:   41                Sex:  male   Admit Date:  04/04/2011 Admit Diagnosis:  Chest Pain     Spouse/Significant Other  Name/Relationship:  n/a    Reason(s) for Visit:  Introductory Visit, Patient Request    Patient:  Receptive    Spiritual/Cultural/Social Issues Assessment:  Spiritual Distress, Coping    Symptoms:  Anxious, Feeling Alone    Advanced Care Planning:  n/a    Patient Verbalizes/Verifies Knowledge of:  n/a    Spiritual Care Intervention:  Ministry of Presence, Supportive Listening, Scripture, Prayer, Spiritual Counsel    Outcomes:  Calm, Peaceful, Increased Hope and Trust    Plan of Care/Goals:  n/a    Continuing Care:  Upon request    Referrals:  None    Contact Information:  C. Jennye Moccasin  Chaplain Resident  Personal Pager: [phone]  On-Call Pager: [phone]

## 2011-04-04 NOTE — ED Nursing Note (Signed)
To CXR now, GCS15.

## 2011-04-04 NOTE — ED Nursing Note (Signed)
Pt using phone to call [facility] to let them know that he is here in ED. Await lab results.

## 2011-04-04 NOTE — ED Nursing Note (Signed)
Pt provided w phone in room. Paged for chaplain services for pt per pt request.

## 2011-04-04 NOTE — ED Nursing Note (Signed)
Pt requesting to speak with his MD for update.

## 2011-04-04 NOTE — ED Nursing Note (Addendum)
Pt updated that he is still waiting for orders and a bed. Pt states that chaplain was here to see him and it helped with this anxiety.

## 2011-04-05 MED ORDER — HYDROMORPHONE (PF) 1 MG/ML INJECTION SYRINGE
0.2000 mg | INJECTION | INTRAMUSCULAR | Status: DC | PRN
Start: 2011-04-05 — End: 2011-04-05
  Administered 2011-04-05 (×3): 1 mg via INTRAVENOUS
  Filled 2011-04-05 (×3): qty 1

## 2011-04-05 MED ORDER — MAGNESIUM SULFATE 1 GRAM/100 ML IN DEXTROSE 5 % INTRAVENOUS PIGGYBACK
1.0000 g | INJECTION | Freq: Once | INTRAVENOUS | Status: AC
Start: 2011-04-05 — End: 2011-04-05
  Administered 2011-04-05: 1 g via INTRAVENOUS
  Filled 2011-04-05: qty 100

## 2011-04-05 MED ORDER — HYDROCODONE 5 MG-ACETAMINOPHEN 325 MG TABLET
1.0000 | ORAL_TABLET | ORAL | Status: DC | PRN
Start: 2011-04-05 — End: 2011-04-05
  Administered 2011-04-05 (×3): 1 via ORAL
  Filled 2011-04-05 (×3): qty 1

## 2011-04-05 MED ORDER — HYDROMORPHONE (PF) 1 MG/ML INJECTION SYRINGE
0.2000 mg | INJECTION | INTRAMUSCULAR | Status: DC | PRN
Start: 2011-04-05 — End: 2011-04-05

## 2011-04-05 NOTE — ED Nursing Note (Signed)
1 hr lunch relief being provided

## 2011-04-05 NOTE — ED Nursing Note (Signed)
Call out to dr. Molinda Bailiff, patient wants to eat and is threatening to go to the cafeteria and raid it, states he has been polite all along and he wants to have docs tell him what is going on.  Dr. Deno Etienne calls back and she is aware and will call back to [place]

## 2011-04-05 NOTE — ED Nursing Note (Signed)
Patient wants to leave dr. Deno Etienne called and she cannot be here x 30 minutes and patient procedes to get dressed, takes his iv out

## 2011-04-05 NOTE — ED Nursing Note (Signed)
Patient states he cannot do a treadmill due to his knee will buckle cardiologist re this

## 2011-04-05 NOTE — ED Nursing Note (Signed)
Patient states he has things to do, told him he could die and his statement back to me that we all have to die sometime.  He may return monday so that he can get something done.  His iv site is wnl as well as the catheter.  Dr. Reynolds Bowl is aware he has left
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
## 2011-04-09 ENCOUNTER — Emergency Department: Admit: 2011-04-09 | Attending: Internal Medicine | Admitting: Internal Medicine

## 2011-04-09 ENCOUNTER — Encounter: Payer: Self-pay | Admitting: OBSTETRICS/GYN

## 2011-04-09 MED ORDER — ONDANSETRON 4 MG DISINTEGRATING TABLET
DISINTEGRATING_TABLET | ORAL | Status: AC
Start: 2011-04-09 — End: 2011-04-09
  Filled 2011-04-09: qty 1

## 2011-04-09 MED ORDER — NITROGLYCERIN 0.4 MG SUBLINGUAL TABLET
0.4000 mg | SUBLINGUAL_TABLET | Freq: Once | SUBLINGUAL | Status: AC
Start: 2011-04-10 — End: 2011-04-09
  Administered 2011-04-09: 0.4 mg via SUBLINGUAL
  Filled 2011-04-09: qty 25

## 2011-04-09 MED ORDER — ASPIRIN 325 MG TABLET
325.0000 mg | ORAL_TABLET | Freq: Once | ORAL | Status: DC
Start: 2011-04-10 — End: 2011-04-10
  Filled 2011-04-09: qty 1

## 2011-04-09 MED ORDER — ONDANSETRON 4 MG DISINTEGRATING TABLET
4.0000 mg | DISINTEGRATING_TABLET | Freq: Once | ORAL | Status: AC
Start: 2011-04-10 — End: 2011-04-10
  Administered 2011-04-10: 4 mg via ORAL

## 2011-04-09 MED ORDER — IBUPROFEN 400 MG TABLET
800.0000 mg | ORAL_TABLET | Freq: Once | ORAL | Status: AC
Start: 2011-04-10 — End: 2011-04-09
  Administered 2011-04-09: 800 mg via ORAL
  Filled 2011-04-09: qty 2

## 2011-04-09 NOTE — ED Triage Note (Signed)
Chestpain started 20 mins pta,pain is sharp radiates to left arm,denies sob,will obtain ekg.hx of acs

## 2011-04-09 NOTE — ED Nursing Note (Signed)
Pt continues with chest pain not improved after two ntg enroute and one ntg SL here. Pt complains of nausea and lightheaddedness .  Further nitro differed.
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
## 2011-04-09 NOTE — ED Nursing Note (Signed)
41 yo M c/o cp that started about 90 min ago. States "I was in [facility] to pick-up my nitro, then I almost passed out but instead leaned forward on to the table". States "it feels like 100 needles stabbing me" on left side of chest, L arm and L jaw. C/o nausea, denies vomiting. C/o sob. Speaking full sentence. Skin Pk/w/d. AA&Ox4. VSS. Pt anxious.

## 2011-04-10 DIAGNOSIS — R079 Chest pain, unspecified: Secondary | ICD-10-CM | POA: Insufficient documentation

## 2011-04-10 MED ORDER — MORPHINE 2 MG/ML INJECTION SYRINGE
2.0000 mg | INJECTION | INTRAMUSCULAR | Status: DC | PRN
Start: 2011-04-10 — End: 2011-04-10
  Administered 2011-04-10: 2 mg via INTRAVENOUS
  Filled 2011-04-10: qty 1

## 2011-04-10 MED ORDER — BISACODYL 10 MG RECTAL SUPPOSITORY
10.0000 mg | RECTAL | Status: DC | PRN
Start: 2011-04-10 — End: 2011-04-10

## 2011-04-10 MED ORDER — DOCUSATE SODIUM 100 MG CAPSULE
100.0000 mg | ORAL_CAPSULE | Freq: Two times a day (BID) | ORAL | Status: DC
Start: 2011-04-10 — End: 2011-04-10
  Administered 2011-04-10: 100 mg via ORAL
  Filled 2011-04-10: qty 1

## 2011-04-10 MED ORDER — NITROGLYCERIN 0.4 MG SUBLINGUAL TABLET
0.4000 mg | SUBLINGUAL_TABLET | SUBLINGUAL | Status: DC | PRN
Start: 2011-04-10 — End: 2011-04-10

## 2011-04-10 MED ORDER — SENNOSIDES 8.6 MG TABLET
2.0000 | ORAL_TABLET | Freq: Every day | ORAL | Status: DC
Start: 2011-04-10 — End: 2011-04-10

## 2011-04-10 MED ORDER — HEPARIN, PORCINE (PF) 5,000 UNIT/0.5 ML INJECTION SYRINGE
5000.0000 [IU] | INJECTION | Freq: Three times a day (TID) | INTRAMUSCULAR | Status: DC
Start: 2011-04-10 — End: 2011-04-10
  Administered 2011-04-10: 5000 [IU] via SUBCUTANEOUS
  Filled 2011-04-10: qty 1

## 2011-04-10 MED ORDER — HYDROCODONE 5 MG-ACETAMINOPHEN 325 MG TABLET
1.0000 | ORAL_TABLET | Freq: Four times a day (QID) | ORAL | Status: DC | PRN
Start: 2011-04-10 — End: 2011-04-10
  Administered 2011-04-10: 1 via ORAL
  Filled 2011-04-10: qty 1

## 2011-04-10 MED ORDER — NACL 0.9% IV INFUSION
INTRAVENOUS | Status: DC
Start: 2011-04-10 — End: 2011-04-10
  Administered 2011-04-10: 02:00:00 via INTRAVENOUS

## 2011-04-10 MED ORDER — ASPIRIN 325 MG TABLET
325.0000 mg | ORAL_TABLET | Freq: Every day | ORAL | Status: DC
Start: 2011-04-10 — End: 2011-04-10

## 2011-04-10 NOTE — ED Nursing Note (Signed)
Report to Don

## 2011-04-10 NOTE — ED Nursing Note (Signed)
Dr. Leavy Cella CCU @ [facility] for AMA form.

## 2011-04-10 NOTE — ED Nursing Note (Signed)
Pt is awake and alert, GCS 15. Pt states "I want to be signed out, I have court at 0900". Pt has self d/c'd IV and redressed. Pt is currently chest pain free. No nausea or emesis. Speaking in full/clear sentences. Dr. Leanora Ivanoff paged.

## 2011-04-10 NOTE — ED Nursing Note (Signed)
Report given to Jim for one hour lunch relief.

## 2011-04-10 NOTE — ED Nursing Note (Signed)
Pt to xray

## 2011-04-10 NOTE — ED Nursing Note (Signed)
Pt sleeping. VSS. NAD.

## 2011-04-10 NOTE — ED Nursing Note (Signed)
Pt cont to c/o cp. Asking for IV medication.
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
## 2011-04-10 NOTE — ED Nursing Note (Signed)
Pt AMA form signed with Dr. Leavy Cella. Vitals are stable. Pt states "If I don't go to court, I will have a bench warrant for my arrest". Pt aware of risks of AMA. Pt ambulated out under own power, steady gait.

## 2011-04-10 NOTE — ED Nursing Note (Signed)
Pt resting quietly. VSS. NAD.

## 2011-04-10 NOTE — ED Nursing Note (Signed)
Report received from Vicente Males.

## 2011-04-10 NOTE — ED Nursing Note (Signed)
Pt AMA. Form signed. Pt ambulated out under own power, steady gait. Chest pain free.

## 2011-04-10 NOTE — ED Nursing Note (Signed)
Pt states he is now unsure if he can stay. States "I  Need to know what time they would do the procedure tomorrow. If it is before 9:30 I would stay, if not I would need to reschedule. I have to go to court tomorrow". Pt c/o cramping in L leg.

## 2011-04-10 NOTE — ED Nursing Note (Signed)
CCU @ [facility].

## 2011-04-11 NOTE — Discharge Summary (Addendum)
CCU DISCHARGE SUMMARY  Date of Admission:   04/09/2011  9:45 PM Date of Discharge 04/10/2011   Admitting  Service: HRT Discharging Service: HRT    Attending Physician at time of Discharge: Nicholas Krueger*        Discharge Diagnosis:  Active Hospital Problems    Chest pain, unspecified        Medications at time of Discharge:  Patient's Medications   New Prescriptions    No medications on file   Previous Medications    ASPIRIN 81 MG DELAYED RELEASE CAPSULE    Take 81 mg by mouth every day.      NITROGLYCERIN (NITROSTAT) 0.4 MG SUBLINGUAL TABLET    Dissolve 0.4 mg under the tongue every 5 minutes if needed.     Modified Medications    No medications on file   Discontinued Medications    No medications on file       Procedure(s) Performed:   None    Consultation(s):   None    Reason for Admission and Brief HPI:   Briefly, Nicholas Krueger is a 41 year old male who was admitted to the CCU service on 04/05/11 for chest pain, who strangely and suspiciously later eloped that same day, but then who returned on 04/09/11 again for the same chest pain. On 04/09/11, the CCU service was asked to admit the patient again given he was just discharged from the CCU service a few days earlier. Please see HPI from 04/09/11 written by Dr. Ronette Deter and co-signed by Dr. Katrinka Blazing for details.     Hospital Course:     The patient was again admitted to the CCU service for the same exact chest pain that he experienced on 04/05/11. There were no EKG changes and his cardiac enzymes were negative for any evidence of ACS. Strangely enough, the patient was complaining of 9/10 chest pain and requesting specifically dilaudid IV, but was seen sleeping comfortably in the ER. He was then admitted for a possible stress test but he demanded to leave on the morning of admission and he signed out AMA.     No discharge procedures on file.   Vital Signs:  Current Vitals  Temp: 36.7 C (98.1 F)  BP: 102/56 mmHg  Pulse: 78   Resp: 16   SpO2: 98 %             Physical Exam:  Patient left AMA, again.     No discharge procedures on file.     No discharge procedures on file.     Discharge Instructions:  No discharge procedures on file.    Recommended Follow Up Appointments:  No follow-up provider specified.     Scheduled Appointments:  No future appointments.     Pertinent Lab, Study, and Image Findings:   I personally reviewed the following none.    Troponin: <0.01 (1/19 @ 0215) --> 0.01 (1/23 @ 2256) --> <0.01 (1/24 @ 0504)    EKG: sinus tachycardia, no STE, no abnormal T wave inversions, unchanged from previous EKG    CT chest with contrast (04/04/11) -   NO EVIDENCE OF PULMONARY EMBOLUS  NO AREAS OF FOCAL CONSOLIDATION    CXR (04/10/11) - No acute process    Studies Pending at Time of Discharge:  none    Comments to Outpatient Physician:  none    Total time spent on discharge 35 minutes.     Nicholas Krueger Resident  Default CC to:    No Pcp No Pcp, MD     Additional CC to:  --   --     ATTENDING NOTE:  I agree with the above note.  I did not see the patient as he left against medical advice prior to rounds.    Report electronically signed by:    Jettie Pagan, M.D.  Attending  Assistant Clinical Professor  Division of Cardiology  PI# [id number]  Pg [phone]

## 2011-12-08 LAB — ECG 12-LEAD
Atrial Rate: 106 {beats}/min
P Axis: 72 degrees
P-R Interval: 138 ms
Q-T Interval: 334 ms
QRS Duration: 98 ms
QTC Calculation (Bezet): 443 ms
R Axis: 70 degrees
T Axis: 36 degrees
Ventricular Rate: 106 {beats}/min

## 2012-01-15 ENCOUNTER — Inpatient Hospital Stay
Admission: EM | Admit: 2012-01-15 | Discharge: 2012-01-16 | DRG: 313 | Disposition: A | Discharge status: Home / Self Care | Payer: Self-pay | Attending: Cardiovascular Disease | Admitting: Cardiovascular Disease

## 2012-01-15 ENCOUNTER — Other Ambulatory Visit (HOSPITAL_BASED_OUTPATIENT_CLINIC_OR_DEPARTMENT_OTHER): Payer: Self-pay

## 2012-01-15 ENCOUNTER — Inpatient Hospital Stay (HOSPITAL_COMMUNITY): Payer: Self-pay | Admitting: Cardiovascular Disease

## 2012-01-15 ENCOUNTER — Encounter (HOSPITAL_COMMUNITY): Payer: Self-pay

## 2012-01-15 DIAGNOSIS — F319 Bipolar disorder, unspecified: Secondary | ICD-10-CM | POA: Diagnosis present

## 2012-01-15 DIAGNOSIS — R079 Chest pain, unspecified: Principal | ICD-10-CM | POA: Diagnosis present

## 2012-01-15 DIAGNOSIS — R55 Syncope and collapse: Secondary | ICD-10-CM | POA: Diagnosis present

## 2012-01-15 DIAGNOSIS — F39 Unspecified mood [affective] disorder: Secondary | ICD-10-CM | POA: Diagnosis present

## 2012-01-15 DIAGNOSIS — I10 Essential (primary) hypertension: Secondary | ICD-10-CM | POA: Diagnosis present

## 2012-01-15 DIAGNOSIS — I252 Old myocardial infarction: Secondary | ICD-10-CM

## 2012-01-15 HISTORY — DX: Essential (primary) hypertension: I10

## 2012-01-15 HISTORY — DX: Cardiac murmur, unspecified: R01.1

## 2012-01-15 HISTORY — DX: Acute myocardial infarction, unspecified (CMS-HCC): I21.9

## 2012-01-15 HISTORY — DX: Acute ischemic heart disease, unspecified (CMS-HCC): I24.9

## 2012-01-15 LAB — CBC WITH DIFF, BLOOD
ANC-Automated: 4.9 10*3/uL (ref 1.6–7.0)
Abs Eosinophils: 0.2 10*3/uL (ref 0.0–0.5)
Abs Lymphs: 1.6 10*3/uL (ref 0.8–3.1)
Abs Monos: 0.5 10*3/uL (ref 0.2–0.8)
Eosinophils: 3 % (ref 1–7)
Hct: 37.3 % — ABNORMAL LOW (ref 40.0–50.0)
Hgb: 12.9 gm/dL — ABNORMAL LOW (ref 13.7–17.5)
Lymphocytes: 22 % (ref 19–53)
MCH: 29.1 pg (ref 26.0–32.0)
MCHC: 34.6 % (ref 32.0–36.0)
MCV: 84 um3 (ref 79.0–95.0)
MPV: 9.5 fL (ref 9.4–12.4)
Monocytes: 7 % (ref 5–12)
Plt Count: 262 10*3/uL (ref 140–370)
RBC: 4.44 10*6/uL — ABNORMAL LOW (ref 4.60–6.10)
RDW: 14.8 % — ABNORMAL HIGH (ref 12.0–14.0)
Segs: 68 % (ref 34–71)
WBC: 7.2 10*3/uL (ref 4.0–10.0)

## 2012-01-15 LAB — ECG 12-LEAD
ATRIAL RATE: 66 {beats}/min
ECG INTERPRETATION: NORMAL
P AXIS: 43 degrees
PR INTERVAL: 126 ms
QRS INTERVAL/DURATION: 94 ms
QT: 386 ms
QTC INTERVAL: 404 ms
R AXIS: 59 degrees
T AXIS: 36 degrees
VENTRICULAR RATE: 66 {beats}/min

## 2012-01-15 LAB — APTT, BLOOD: PTT: 29.6 s (ref 25.0–34.0)

## 2012-01-15 LAB — CKMB+INDEX (NO CPK), BLOOD
CK-MB Index: 1.9 % (ref 0.0–2.5)
CK-MB Index: 1.9 % (ref 0.0–2.5)
CK-MB: 4.7 ng/mL (ref 0.0–4.8)
CK-MB: 3.8 ng/mL (ref 0.0–4.8)

## 2012-01-15 LAB — BASIC METABOLIC PANEL, BLOOD
BUN: 16 mg/dL (ref 6–20)
Bicarbonate: 27 mmol/L (ref 22–29)
Calcium: 8.9 mg/dL (ref 8.6–10.5)
Chloride: 105 mmol/L (ref 98–107)
Creatinine: 0.73 mg/dL (ref 0.67–1.17)
GFR: >60 mL/min
Glucose: 103 mg/dL (ref 70–115)
Potassium: 3.8 mmol/L (ref 3.5–5.1)
Sodium: 140 mmol/L (ref 136–145)

## 2012-01-15 LAB — TROPONIN T, BLOOD
Troponin T: <0.01 ng/mL (ref <0.01)
Troponin T: <0.01 ng/mL (ref <0.01)

## 2012-01-15 LAB — CPK-CREATINE PHOSPHOKINASE, BLOOD
CPK: 247 U/L — ABNORMAL HIGH (ref 0–175)
CPK: 201 U/L — ABNORMAL HIGH (ref 0–175)

## 2012-01-15 LAB — PROTHROMBIN TIME, BLOOD
INR: 1.2
PT,Patient: 12.7 s — ABNORMAL HIGH (ref 9.7–12.5)

## 2012-01-15 MED ORDER — LORAZEPAM 2 MG/ML IJ SOLN
2.0000 mg | Freq: Once | INTRAMUSCULAR | Status: AC | PRN
Start: 2012-01-15 — End: 2012-01-15
  Administered 2012-01-15: 2 mg via INTRAVENOUS
  Filled 2012-01-15: qty 1

## 2012-01-15 MED ORDER — POTASSIUM CHLORIDE CRYS CR 10 MEQ OR TBCR
40.0000 meq | EXTENDED_RELEASE_TABLET | Freq: Once | ORAL | Status: AC
Start: 2012-01-15 — End: 2012-01-15
  Administered 2012-01-15: 40 meq via ORAL
  Filled 2012-01-15: qty 4

## 2012-01-15 MED ORDER — DIAZEPAM 5 MG/ML IJ SOLN
5.0000 mg | Freq: Once | INTRAMUSCULAR | Status: AC
Start: 2012-01-15 — End: 2012-01-15
  Administered 2012-01-15: 5 mg via INTRAVENOUS
  Filled 2012-01-15: qty 2

## 2012-01-15 MED ORDER — ONDANSETRON HCL 4 MG/2ML IV SOLN
4.0000 mg | Freq: Four times a day (QID) | INTRAMUSCULAR | Status: AC | PRN
Start: 2012-01-15 — End: 2012-01-16

## 2012-01-15 MED ORDER — SODIUM CHLORIDE 0.9 % IV SOLN
Freq: Once | INTRAVENOUS | Status: AC
Start: 2012-01-15 — End: 2012-01-15

## 2012-01-15 MED ORDER — LISINOPRIL 20 MG OR TABS
20.00 mg | ORAL_TABLET | Freq: Every day | ORAL | Status: DC
Start: ? — End: 2012-09-14

## 2012-01-15 MED ORDER — NITROGLYCERIN 0.4 MG SL SUBL
0.4000 mg | SUBLINGUAL_TABLET | SUBLINGUAL | Status: DC | PRN
Start: 2012-01-15 — End: 2012-01-16
  Administered 2012-01-15 (×2): 0.4 mg via SUBLINGUAL
  Filled 2012-01-15: qty 25

## 2012-01-15 MED ORDER — NITROGLYCERIN 0.4 MG/SPRAY TL SOLN
1.0000 | Freq: Once | Status: DC
Start: 2012-01-15 — End: 2012-01-16
  Filled 2012-01-15: qty 4.9

## 2012-01-15 MED ORDER — HYDROMORPHONE HCL 1 MG/ML IJ SOLN
0.5000 mg | Freq: Once | INTRAMUSCULAR | Status: AC
Start: 2012-01-15 — End: 2012-01-15
  Administered 2012-01-15: 0.5 mg via INTRAVENOUS
  Filled 2012-01-15: qty 0.5

## 2012-01-15 MED ORDER — METOPROLOL TARTRATE 1 MG/ML IV SOLN
5.0000 mg | Freq: Once | INTRAVENOUS | Status: AC | PRN
Start: 2012-01-15 — End: 2012-01-15
  Administered 2012-01-15: 5 mg via INTRAVENOUS
  Filled 2012-01-15: qty 5

## 2012-01-15 MED ORDER — ASPIRIN 81 MG OR TABS
81.00 mg | ORAL_TABLET | Freq: Every day | ORAL | Status: DC
Start: ? — End: 2012-01-16

## 2012-01-15 MED ORDER — NITROGLYCERIN 0.4 MG SL SUBL
0.40 mg | SUBLINGUAL_TABLET | SUBLINGUAL | Status: DC | PRN
Start: ? — End: 2012-01-16

## 2012-01-15 NOTE — ED Notes (Signed)
Pts BP 114/51 (MAP 65) after second nitro SL.  Pt reports chest pain at 7/10 on pain scale.  MD Holmon notified.  NS bolus ordered and administered.

## 2012-01-15 NOTE — ED Notes (Signed)
ekg done and t/o to md tolia Copy placed into chart

## 2012-01-15 NOTE — ED Notes (Signed)
Bed:04A<BR> Expected date:<BR> Expected time:<BR> Means of arrival:<BR> Comments:<BR>

## 2012-01-15 NOTE — ED Attending Note (Signed)
ED ATTENDING NOTEEMIR NACK  MRN: [id number]  DOB: [birth year]  PMD: No primary provider on file.    CC: Chest Pain    Chief Complaint   Patient presents with   . Chest Pain         HPI: Pt seen and examined. Case reviewed with Dr. Rogelia Rohrer. [name] [name] is a 41 year old male  has a past medical history of Myocardial infarction; ACS (acute coronary syndrome); Heart murmur; and Hypertension. who presents with chest pain while waiting to speak to his case worker at [facility] at rest L sided pressure to jaw and L arm +diaphoresis no sob, orthopnea or pnd. Is active no pain with exertion though does not exercise due to chronic back and knee pain. Has been seen in [place] twice over the last 1.5 yrs and had no cath and refused stress test there. No HA or vision changes. No paresthesias or weakness, denies falls or trauma. No syncope but did feel dizzy at the time. Some improvement w ntg but asking for morphine.     Pt denies f/c/s, n/v/d/c, sob, HA or vision changes, blood in stool or dysuria, cough or hemoptysis, falls or trauma, paresthesias or weakness.    PMHx:  has a past medical history of Myocardial infarction; ACS (acute coronary syndrome); Heart murmur; and Hypertension.  PSHx:  has past surgical history that includes knee surgery; back surgery; and surgery to the skull (from fracture).  SocialHx:  +1/2 ppd down from 2.5 ppd, no etoh, occ marijuana using friends vicodin  FamHx: strong cad risks with father mi x2 in 31s and cva, brother died jogging age 40  Meds:   Prior to Admission Medications   Outpatient Medications Last Dose Informant Patient Reported? Taking?   aspirin 81 MG tablet   Yes Yes   Sig: Take 81 mg by mouth daily.   lisinopril (PRINIVIL, ZESTRIL) 20 MG tablet   Yes Yes   Sig: Take 20 mg by mouth daily.   nitroGLYcerin (NITROSTAT) 0.4 MG SL tablet   Yes Yes   Sig: 0.4 mg by Sublingual route every 5 minutes as needed. up to 3 tabs per episode.      Facility-Administered Medications: None     ALL:  Toradol and Tramadol  ROS: 10pt ROS negative other than stated in HPI    EXAM  BP 111/63  Pulse 71  Temp(Src) 98 F (36.7 C)  Resp 18  Ht 6\' 1"  (1.854 m)  Wt 86.183 kg (190 lb)  BMI 25.07 kg/m2  SpO2 95%  GEN nad alert nontoxic appears comfortable watching tv  HEENT ncat anicteric mmm o/p clear no lesions or exudate  NECK supple no lad FROM no c-spine ttp no jvd  CV rrr s1 s2 no m no chest wall ttp  LUNGS ctab no r/r/w  ABD soft ntnd nabs no mass  BACK no midline ttp or CVAT  EXT no edema  SKIN no rash  NEURO nl mentation, speech, and gait, maew    Cardiac Monitor: NSR no ectopy    EKG: unchanged from previous tracings, normal EKG, normal sinus rhythm nl qtc 404, no ste or twi or other ischemic changes no ectopy.    Orders Placed This Encounter   Procedures   . X-Ray Chest Single View Frontal   . Basic Metabolic Panel, Blood Green Plasma Separator Tube   . CPK, Blood Green Plasma Separator Tube   . CKMB +Index (No CPK) Green Plasma Separator Tube   .  Troponin T, Blood Green Plasma Separator Tube   . CBC w/Diff Lavender   . Prothrombin Time, Blood Blue   . aPTT, Blood Blue   . ECG, Complete           IMAGING READS:  X-ray Chest Single View Frontal    01/15/2012   HISTORY: ED Room:04A  Clinical History:->Chest Pain Chest Views:->AP Immediate Page for  Portable->Yes Should The Procedure Be Done Portably?->Yes  COMPARISON STUDIES: None  TECHNIQUE: Single AP view of the chest  FINDINGS:  See impression.  IMPRESSION:  No pneumothorax, consolidation or pleural effusions.  Normal cardiac  silhouette.  Overall no evidence of acute disease.        A/P: [name] [name] is a 41 year old male with L sided cp x 45 mins pta. ACS is a consideration given his symptoms and family history. Initial EKG not concerning and no suspicion for PE or dissection. Will d/w cardiology for further risk stratification      D/w pt who understands and agrees w plan as stated, all questions answered

## 2012-01-15 NOTE — ED Notes (Signed)
Pt drowsy.  A&Ox3.  Arousable by voice.  On cardiac monitor.  Urinal at bedside.  Educated pt on need for urine sample.  Waiting on bed assignment.

## 2012-01-15 NOTE — ED Notes (Signed)
Pt sleeping.  On cardiac monitor.  Awaiting on cardiology consult.

## 2012-01-15 NOTE — ED Notes (Signed)
Pt and RN [name] [name] returned from CT.  Pt unable to lay still during procedure.  Pt states, "It's the states fault. I received electro-shock therapy when I was 13."  Pt remained on cardiac monitor during transport and procedure.  Chest pain remains at a 7/10.  MD Xu aware of situation in CT and pain scale.

## 2012-01-15 NOTE — ED Notes (Signed)
Pt requesting pain medicine, Dr Roda Shutters aware of the request.

## 2012-01-15 NOTE — ED Notes (Signed)
Blood samples collected/labeled and sent to the lab.

## 2012-01-15 NOTE — H&P (Signed)
HISTORY AND PHYSICAL    Attending MD:   Elmon Kirschner, MD    Chief Complaint:  Chest pain    Pain Assessment:  The patient endorses pain as 7 out of 10, located left anterior chest, and described as pressure like.    History of Present Illness:     [name] [name] is a 41 year old male who is here for evaluation of the above chief complaint.  Sudden onset left anterior pressure like cp 10/10 while going through pysch intake today (voluntary treatment for depression and SI). Pain radiated to his neck and left arm. Associated with dyspnea, nausea, and diaphoresis. Minimal improvement after 5 sl nitro. Pain is finally under control in the ED after receiving dilaudid and valium. Pain was not worse with exertion today. No baseline exertional cp or doe. Pt is fairly active at baseline. No orthopnea, pnd, or palpitations. Pt has a long history of unexplained syncope for years, occuring a few times per year, last episode was 1-2 months ago. One similar episode of cp about 1.5 years ago, diagnosed as ACS/MI. Pt recalls that he was treated conservatively at the time. Echo and CT was performed and he was discharged after observation. No angiograms were done at the time. Evaluation was done at a hospital in [place].     CTA coronary was attempted, but pt could not tolerate the study due to claustrophobia.     Past Medical and Surgical History:  Past Medical History   Diagnosis Date   . Myocardial infarction    . ACS (acute coronary syndrome)    . Heart murmur    . Hypertension      Past Surgical History   Procedure Laterality Date   . Knee surgery     . Back surgery     . Surgery to the skull  from fracture       Allergies:  Allergies   Allergen Reactions   . Toradol Hives   . Tramadol Hives       Medications:  No current facility-administered medications on file prior to encounter.     Current Outpatient Prescriptions on File Prior to Encounter   Medication Sig Dispense Refill   . aspirin 81 MG tablet Take 81 mg by  mouth daily.       Marland Kitchen lisinopril (PRINIVIL, ZESTRIL) 20 MG tablet Take 20 mg by mouth daily.       . nitroGLYcerin (NITROSTAT) 0.4 MG SL tablet 0.4 mg by Sublingual route every 5 minutes as needed. up to 3 tabs per episode.           Social History: 75yr smoking history. No etoh. Prior mariajuana use.   History     Social History   . Marital Status: Single     Spouse Name: N/A     Number of Children: N/A   . Years of Education: N/A     Social History Main Topics   . Smoking status: Not on file   . Smokeless tobacco: Not on file   . Alcohol Use: Not on file   . Drug Use: Not on file   . Sexually Active: Not on file     Other Topics Concern   . Not on file     Social History Narrative   . No narrative on file       Family History: early CAD in father and brother in their 34s.     Review of Systems:  General: negative other than  stated in HPI  Skin: negative other than stated in HPI  HEENT: negative other than stated in HPI  Respiratory: negative other than stated in HPI  Cardiovascular: negative other than stated in HPI  GI: negative other than stated in HPI  Heme: negative other than stated in HPI  GU: negative other than stated in HPI  Musculoskeletal: negative other than stated in HPI  Endocrine: negative other than stated in HPI  CNS: negative other than stated in HPI  Psych: positive for depression and suicidal ideations      Physical Exam:  BP 97/54  Pulse 65  Temp(Src) 98 F (36.7 C)  Resp 16  Ht 6\' 1"  (1.854 m)  Wt 86.183 kg (190 lb)  BMI 25.07 kg/m2  SpO2 96%  Gen: A+Ox3, NAD  Skin: no lesions or rash  HEENT: PERRLA EOMI. OP c/i. MMM.   Neck: Neck supple, trachea midline.   CV: RRR s1 s2. No m/r/g. JVP <8  Chest: CTAB  Abd: soft NT ND +BS no HSM  Ext: no LE edema.   Neuro: CN II-XII grossly intact. Normal sensation and 5/5 strength bilateral UE/LE.     Labs and Other Data:  Lab Results   Component Value Date    NA 140 01/15/2012    K 3.8 01/15/2012    CL 105 01/15/2012    BICARB 27 01/15/2012    BUN 16  01/15/2012    CREAT 0.73 01/15/2012    GLU 103 01/15/2012    [place] 8.9 01/15/2012     Lab Results   Component Value Date    WBC 7.2 01/15/2012    HGB 12.9* 01/15/2012    HCT 37.3* 01/15/2012    PLT 262 01/15/2012     Lab Results   Component Value Date    INR 1.2 01/15/2012    PTT 29.6 01/15/2012     Lab Results   Component Value Date    CPK 201* 01/15/2012    CPK 247* 01/15/2012    CKMBH 3.8 01/15/2012    CKMBH 4.7 01/15/2012    TROPONIN <0.01 01/15/2012    TROPONIN <0.01 01/15/2012       ECG: normal sinus rhythm.     Assessment and Care Plan:    1. CP: symptoms are suggestive of unstable angina. CMx2 are negative. No sig ECG changes. Minimal response to nitro. May be a non-cardiac origin given negative objective workup for MI.   -admit for ACS r/o  -serial CM  -amibi in the AM  -echo in the AM  -nitro prn for CP  -morphine for breakthrough pain  -cont ASA and lisinopril  -add lipitor 40mg  qday  -lipid panel    2. Depression: pt is voluntary now. Will need to place on hold if he tries to leave  -will have sitter watch patient  -pysch consult in the AM    3. Chronic syncope: after acute issues resolve. Pt should f/u with cardiology for an event monitor for further evaluation.     Prophy: SCD, famotidine, lovenox    NPO after MN    This plan and alternatives have been discussed with the patient and/or surrogate.    Code Status: FC/FC    Note Author: Loyce Dys, 01/15/2012, 9:44 PM    Attending addendum:  Patient left against medical advice before I was able to see him.   He did not get the ordered tests.

## 2012-01-15 NOTE — ED Notes (Signed)
Pt on cardiac monitor.  NSR 65.  Report given to Thayer Ohm, RN - break RN.

## 2012-01-15 NOTE — ED Notes (Signed)
Pt to CT on cardiac monitor.  HR 68. BP 114/69. Transported by RN T. Manson Passey.

## 2012-01-15 NOTE — ED Provider Notes (Signed)
Emergency Department Note    CC :   Chief Complaint   Patient presents with   . Chest Pain       HPI :   41 year old  male with possible CAD presents to the ED with CP since 12:15pm today. The pt was at his outpt psychiatrist when he began to complain of CP. The CP was tight , squeezing, left-sided, radiated to his left neck, jaw, and shoulder. The CP is associated with lightheadedness and diaphoresis. The pt says this is exactly like his previous MI. The pt describes a time three years ago when he was admitted for what he understood was an MI. He never had a catherterization. He was discharged three days later with a outpt stress test scheduled. He never obtained the stress test. Got 325mg  of ASA, 4mg  of zofran, and 1in of Nitro paste.      Past Medical History :   Past Medical History   Diagnosis Date   . Myocardial infarction    . ACS (acute coronary syndrome)    . Heart murmur    . Hypertension        Past Surgical history :   Past Surgical History   Procedure Laterality Date   . Knee surgery     . Back surgery     . Surgery to the skull  from fracture       Family History:  unknown    Social History:  +tob, -EtOH, lives in [place].    Medications:  Patient's Medications   New Prescriptions    No medications on file   Previous Medications    ASPIRIN 81 MG TABLET    Take 81 mg by mouth daily.    LISINOPRIL (PRINIVIL, ZESTRIL) 20 MG TABLET    Take 20 mg by mouth daily.    NITROGLYCERIN (NITROSTAT) 0.4 MG SL TABLET    0.4 mg by Sublingual route every 5 minutes as needed. up to 3 tabs per episode.   Modified Medications    No medications on file   Discontinued Medications    No medications on file       Allergies :  Toradol and Tramadol    Review of Systems:   Constitutional: Negative for fever, chills.   Respiratory: Negative for cough and shortness of breath.    Cardiovascular: Negative for palpitations and leg swelling.   Gastrointestinal: Negative for  vomiting, abdominal pain and diarrhea.   Musculoskeletal:  Negative for myalgias, back pain and arthralgias.   Neurological: Negative for dizziness, weakness, and headaches.         Physical Exam:   4    01/15/12  1400 01/15/12  1512 01/15/12  1620 01/15/12  1627   BP:  111/64 122/74 122/74   Pulse:  66 68 76   Temp:       Resp:  18 16    SpO2: 97% 99% 98%      Nursing note and vitals reviewed.  Constitutional: The patient is oriented to person, place, and time and appears well-developed and well-nourished. Appears very anxious.  Cardiovascular: Regular rhythm, normal heart sounds. Exam reveals no gallop and no friction rub. No murmur heard.  Pulmonary/Chest: Effort normal and breath sounds normal. No respiratory distress. No wheezes, rhonchi, or rales . No chest wall tenderness.   Abdominal: Soft. No distension and no masses. There is no tenderness.   Neuro: CNs 2-12 intact, mentation appropriate.    Labs:  Results for orders  placed during the hospital encounter of 01/15/12   ECG 12-LEAD       Result Value Range    VENTRICULAR RATE 66      ATRIAL RATE 66      PR INTERVAL 126      QRS INTERVAL/DURATION 94      QT 386      QTC INTERVAL 404      P AXIS 43      R AXIS 59      T AXIS 36      ECG INTERPRETATION        Value: Normal sinus rhythm with sinus arrhythmia      Normal ECG            Confirmed by Above generated by computer only, Results in ED notes (204),       editor EMPIZO, STEPHANIE ([id number]) on 01/15/2012  4:35:51 PM   BASIC METABOLIC PANEL, BLOOD       Result Value Range    Glucose 103  70 - 115 mg/dL    BUN 16  6 - 20 mg/dL    Creatinine 2.44  0.10 - 1.17 mg/dL    GFR >27      Sodium 140  136 - 145 mmol/L    Potassium 3.8  3.5 - 5.1 mmol/L    Chloride 105  98 - 107 mmol/L    Bicarbonate 27  22 - 29 mmol/L    Calcium 8.9  8.6 - 10.5 mg/dL   CPK-CREATINE PHOSPHOKINASE, BLOOD       Result Value Range    CPK 247 (*) 0 - 175 U/L   CKMB+INDEX (NO CPK) PANEL, BLOOD       Result Value Range    CK-MB 4.7  0.0 - 4.8 ng/mL    CK-MB Index 1.9  0.0 - 2.5 %   TROPONIN T, BLOOD        Result Value Range    Troponin T <0.01  <0.01 ng/mL   CBC WITH ADIFF, BLOOD       Result Value Range    WBC 7.2  4.0 - 10.0 1000/mm3    RBC 4.44 (*) 4.60 - 6.10 mill/mm3    Hgb 12.9 (*) 13.7 - 17.5 gm/dL    Hct 25.3 (*) 66.4 - 50.0 %    MCV 84.0  79.0 - 95.0 um3    MCH 29.1  26.0 - 32.0 pgm    MCHC 34.6  32.0 - 36.0 %    RDW 14.8 (*) 12.0 - 14.0 %    MPV 9.5  9.4 - 12.4 fL    Plt Count 262  140 - 370 1000/mm3    Segs 68  34 - 71 %    Lymphocytes 22  19 - 53 %    Monocytes 7  5 - 12 %    Eosinophils 3  1 - 7 %    Absolute Neutrophil Count 4.9  1.6 - 7.0 K/uL    Abs Lymphs 1.6  0.8 - 3.1 1000/mm3    Abs Monos 0.5  0.2 - 0.8 1000/mm3    Abs Eosinophils 0.2  0.0 - 0.5 1000/mm3    Diff Type Automated     PROTHROMBIN TIME, BLOOD       Result Value Range    PT,Patient 12.7 (*) 9.7 - 12.5 sec    INR 1.2     APTT, BLOOD       Result Value Range    PTT 29.6  25.0 - 34.0 sec         Diagnostic studies:  No results found for this visit on 01/15/12.  Orders Placed This Encounter   Procedures   . X-Ray Chest Single View Frontal   . CTA Coronary         ED Course & Clinical Decision Making:  Pt complaining of cp which is concerning for coronary ischemia since 12pm.  Unclear prior medical hx, doubt NSTEMI of STEMI    CBC: no leukocytosis, slight anemia, no thrombocytopenia  BMP: no renal dysfunction, all electrolytes unremarkable  Liver Panel: no e/o of liver disease, normal biliary labs.   Coags: normal  CMs (1hr): not elevated    EKG: Normal sinus rhythm. No axis deviation. PR, QRS, and QT intervals wnl. P and QRS wave morphology normal.  No e/o new BBB, STE, or any other evidence of acute ischemia.     Pt is good candidate for coronary CTA.   IV valium given for anxiety.     Pt will need the following directly before the scan:  5mg  IV metoprolol  SL Nitro spray (nurse to bring to scanner)  IV Ativan    CT pending.    Pt singed out to co-resident at 4:00pm.     I have discussed my thought process and plan for the patient with  the attending, Dr. Odis Hollingshead.    Melody Comas, MD (PGY2)            Rosie Fate, MD  Resident  01/15/12 [phone]

## 2012-01-15 NOTE — ED Floor Report (Signed)
[name] [name] is a 41 year old male    Chief Complaint   Patient presents with   . Chest Pain       Admitted for There are no admission diagnoses documented for this encounter.    Code Status:  Please refer to In-pt admitting doctors orders     Past Medical History   Diagnosis Date   . Myocardial infarction    . ACS (acute coronary syndrome)    . Heart murmur    . Hypertension        Past Surgical History   Procedure Laterality Date   . Knee surgery     . Back surgery     . Surgery to the skull  from fracture       Allergies: Toradol and Tramadol    Level of Care : med-surg with tele    Fall Risk: no    Skin issues:  no    >> If yes, note areas of skin breakdown. See appropriate photos.      Ambulatory:  yes    Sitter needed: yes    Suicide Risk :  no    Isolation Required: no     >> If yes , what type of isolation:    Is patient in custody?  no    Is patient in restraints? no    Is patient on Heparin? no If yes, complete below:   Bolus=    Units      Units/hr   @   Mls/hour      Time of next PT/PTT draw:          Brief Summary of ED Visit (to include focused assessment and neuro status):  Pt was having weekly counselor meeting at [facility] and began to have substernal chest pain with pain radiating to L side of neck.  Unrelieved with nitro and ASA.  Two sets of cardiac enzymes negative.  Attempted CTA chest using 2mg  Ativan but unsuccessful.  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  Chest pain remains a 5/10 on pain scale.  NSR on the monitor.  Charge RN reports that pt will have sitter on floor until psych consult can be completed.  Stress test to be done tomorrow.         See  RN SHIFT ASSESSMENT ADULT  for full assessment, exceptions will be listed.    Cardiac rhythm:  NSR    Oxygen Delivery: Nasal Cannula 2 L/Min    Filed Vitals:    01/15/12 1929 01/15/12 2135 01/15/12 2155 01/15/12 2253   BP: 97/54 103/63 112/51 107/59   Pulse: 65  66 75   Temp:   97.6 F (36.4 C)    Resp: 16  18 16      Height:       Weight:       SpO2: 96% 95% 97% 96%       Lab Results   Component Value Date    WBC 7.2 01/15/2012    RBC 4.44* 01/15/2012    HGB 12.9* 01/15/2012    HCT 37.3* 01/15/2012    MCV 84.0 01/15/2012    MCHC 34.6 01/15/2012    RDW 14.8* 01/15/2012    PLT 262 01/15/2012    MPV 9.5 01/15/2012       Lab Results   Component Value Date    NA 140 01/15/2012    K 3.8 01/15/2012    CL 105 01/15/2012    BICARB 27 01/15/2012  BUN 16 01/15/2012    CREAT 0.73 01/15/2012    GLU 103 01/15/2012    [place] 8.9 01/15/2012       No results found for this basename: BNP, PHOS, MG, LACTATE, AMMONIA, IONCA, ARTIONCA       Lab Results   Component Value Date    CPK 201* 01/15/2012    CPK 247* 01/15/2012    CKMBH 3.8 01/15/2012    CKMBH 4.7 01/15/2012    TROPONIN <0.01 01/15/2012    TROPONIN <0.01 01/15/2012       No results found for this basename: PH, PCO2, O2CONTENT, IVHC3, IVBE, O2SAT, UNPH, UNPCO2, ARTPH, ARTPCO2, ARTO2CNT, IAHC3, IABE, ARTO2SAT, UNAPH, UNAPCO2       No results found for this visit on 01/15/12.    RADIOLOGIC STUDIES NOT COMPLETED: n/a  (none unless otherwise noted)    Active PICC Line / CVC Line / PIV Line / Drain / Airway / Intraosseous Line / Epidural Line / ART Line / Line Type / Wound    Name: Placement date: Placement time: Site: Days:    Peripheral IV - 20 G Left Forearm 01/15/12    Forearm  less than 1    Peripheral IV - 18 G Right Antecubital 01/15/12  1620  Antecubital  less than 1            Any significant events and interventions with responses:        Floor nurse informed that report is ready for review.  Opportunity to answer questions with floor RN face to face or by phone. ER number is [phone] . ER RN Lorenza Cambridge to be contacted for any questions.    Has this report been updated?  no  By Who:   Time:   Additional information by updated user:

## 2012-01-15 NOTE — ED Notes (Addendum)
So note  41 yo M pmh cad presenting with cp since 12:15pm  -ekg unremarkable  -initial trop neg  -cta coronary pending  -prn orders of nitro spray, ativan, metoprolol ordered to keep pt's hr in target range for Serita Butcher, MD  Resident  01/15/12 1600    Pt unable to tolerate CTA 2/2 claustrophobia.   Repeat trop neg.  Pt assessed by cards.  Admitted to cards for further workup and treatment.   Pt assessed by me. Strongly denies any SI/HI. Denies any ah/vh. Had been sent over on voluntary basis from [facility].       Zoe Lan, MD  Resident  01/15/12 [phone]

## 2012-01-15 NOTE — ED Notes (Signed)
Pt BIBA from [facility].  Was at [facility] for weekly update with counselor.  C/o crushing substernal chest pain that radiates to left side of neck with SOB.  History of MI in Jan 2012.  Pt was given SL nitro with 325 of ASA by [facility] and EMS with no relief.  Pt reports recent leg swelling when waking up in the morning.

## 2012-01-15 NOTE — ED Notes (Signed)
Signed out  Strong family history of cad, brother died of mi, here with cp that started when about to see [facility] caseworker. Pt refused coronary CT  To be seen by cardiology, admit vs dc with close follow up    Alekzander Cardell, Rosaria Ferries, MD  01/15/12 2048

## 2012-01-16 DIAGNOSIS — F339 Major depressive disorder, recurrent, unspecified: Secondary | ICD-10-CM

## 2012-01-16 DIAGNOSIS — R079 Chest pain, unspecified: Secondary | ICD-10-CM

## 2012-01-16 LAB — HEMOGRAM, BLOOD
Hct: 39 % — ABNORMAL LOW (ref 40.0–50.0)
Hgb: 13.1 gm/dL — ABNORMAL LOW (ref 13.7–17.5)
MCH: 28.6 pg (ref 26.0–32.0)
MCHC: 33.6 % (ref 32.0–36.0)
MCV: 85.2 um3 (ref 79.0–95.0)
MPV: 10.1 fL (ref 9.4–12.4)
Plt Count: 266 10*3/uL (ref 140–370)
RBC: 4.58 10*6/uL — ABNORMAL LOW (ref 4.60–6.10)
RDW: 15 % — ABNORMAL HIGH (ref 12.0–14.0)
WBC: 6.6 10*3/uL (ref 4.0–10.0)

## 2012-01-16 LAB — BASIC METABOLIC PANEL, BLOOD
BUN: 13 mg/dL (ref 6–20)
Bicarbonate: 26 mmol/L (ref 22–29)
Calcium: 9.1 mg/dL (ref 8.6–10.5)
Chloride: 101 mmol/L (ref 98–107)
Creatinine: 0.83 mg/dL (ref 0.67–1.17)
GFR: >60 mL/min
Glucose: 84 mg/dL (ref 70–115)
Potassium: 4.2 mmol/L (ref 3.5–5.1)
Sodium: 137 mmol/L (ref 136–145)

## 2012-01-16 LAB — LIPID(CHOL FRACT) PANEL, BLOOD
Cholesterol: 88 mg/dL (ref <200)
HDL-Cholesterol: 38 mg/dL
LDL-Chol (Calc): 39 mg/dL (ref <160)
Triglycerides: 56 mg/dL (ref 10–170)

## 2012-01-16 LAB — UR DRUGS OF ABUSE SCREEN
Amphetamines Screen: NEGATIVE
Barbiturates Screen: NEGATIVE
Benzodiazepine Screen: NEGATIVE
Cocaine Screen: NEGATIVE
Methadone Screen: NEGATIVE
Opiates Screen: POSITIVE
Oxycodone Screen: NEGATIVE
Phencyclidine Screen: NEGATIVE
Propoxyphen: NEGATIVE
THC Screen: NEGATIVE

## 2012-01-16 LAB — CPK-CREATINE PHOSPHOKINASE, BLOOD: CPK: 185 U/L — ABNORMAL HIGH (ref 0–175)

## 2012-01-16 LAB — CKMB+INDEX (NO CPK), BLOOD
CK-MB Index: 1.9 % (ref 0.0–2.5)
CK-MB: 3.6 ng/mL (ref 0.0–4.8)

## 2012-01-16 LAB — MAGNESIUM, BLOOD: Magnesium: 2 mg/dL (ref 1.7–2.6)

## 2012-01-16 LAB — TROPONIN T, BLOOD: Troponin T: <0.01 ng/mL (ref <0.01)

## 2012-01-16 MED ORDER — SODIUM CHLORIDE 0.9 % IJ SOLN (CUSTOM)
3.0000 mL | Freq: Three times a day (TID) | INTRAMUSCULAR | Status: DC
Start: 2012-01-16 — End: 2012-01-16
  Administered 2012-01-16 (×2): 3 mL via INTRAVENOUS

## 2012-01-16 MED ORDER — LORAZEPAM 1 MG OR TABS
1.0000 mg | ORAL_TABLET | Freq: Four times a day (QID) | ORAL | Status: DC | PRN
Start: 2012-01-16 — End: 2012-01-16

## 2012-01-16 MED ORDER — SODIUM CHLORIDE 0.9 % IV SOLN
INTRAVENOUS | Status: DC | PRN
Start: 2012-01-16 — End: 2012-01-16

## 2012-01-16 MED ORDER — ATORVASTATIN CALCIUM 40 MG OR TABS
40.0000 mg | ORAL_TABLET | Freq: Every evening | ORAL | Status: DC
Start: 2012-01-16 — End: 2012-01-16
  Administered 2012-01-16: 40 mg via ORAL
  Filled 2012-01-16: qty 1

## 2012-01-16 MED ORDER — LISINOPRIL 20 MG OR TABS
20.0000 mg | ORAL_TABLET | Freq: Every day | ORAL | Status: DC
Start: 2012-01-16 — End: 2012-01-16
  Administered 2012-01-16: 20 mg via ORAL
  Filled 2012-01-16: qty 1

## 2012-01-16 MED ORDER — ACETAMINOPHEN 325 MG PO TABS
650.0000 mg | ORAL_TABLET | ORAL | Status: DC | PRN
Start: 2012-01-16 — End: 2012-01-16

## 2012-01-16 MED ORDER — FAMOTIDINE 20 MG OR TABS
20.0000 mg | ORAL_TABLET | Freq: Two times a day (BID) | ORAL | Status: DC
Start: 2012-01-16 — End: 2012-01-16
  Administered 2012-01-16: 20 mg via ORAL
  Filled 2012-01-16: qty 1

## 2012-01-16 MED ORDER — SODIUM CHLORIDE 0.9 % IJ SOLN (CUSTOM)
3.0000 mL | INTRAMUSCULAR | Status: DC | PRN
Start: 2012-01-16 — End: 2012-01-16

## 2012-01-16 MED ORDER — ASPIRIN 325 MG OR TABS
325.0000 mg | ORAL_TABLET | Freq: Every day | ORAL | Status: DC
Start: 2012-01-16 — End: 2012-01-16
  Administered 2012-01-16: 325 mg via ORAL
  Filled 2012-01-16: qty 1

## 2012-01-16 MED ORDER — NITROGLYCERIN 0.4 MG SL SUBL
0.4000 mg | SUBLINGUAL_TABLET | SUBLINGUAL | Status: DC | PRN
Start: 2012-01-16 — End: 2012-01-16

## 2012-01-16 MED ORDER — MORPHINE SULFATE 15 MG OR TABS
15.0000 mg | ORAL_TABLET | ORAL | Status: DC | PRN
Start: 2012-01-16 — End: 2012-01-16
  Administered 2012-01-16 (×2): 15 mg via ORAL
  Filled 2012-01-16 (×2): qty 1

## 2012-01-16 MED ORDER — ASPIRIN 81 MG OR CHEW
81.0000 mg | CHEWABLE_TABLET | Freq: Once | ORAL | Status: DC
Start: 2012-01-16 — End: 2012-01-16

## 2012-01-16 MED ORDER — ENOXAPARIN SODIUM 40 MG/0.4ML SC SOLN
40.0000 mg | Freq: Every day | SUBCUTANEOUS | Status: DC
Start: 2012-01-16 — End: 2012-01-16
  Administered 2012-01-16: 40 mg via SUBCUTANEOUS
  Filled 2012-01-16: qty 0.4

## 2012-01-16 NOTE — Plan of Care (Signed)
Problem: Tissue Perfusion - Cardiopulmonary, Altered  Goal: Hemodynamic stability  Outcome: Met  BP 112/69  Pulse 64  Temp(Src) 97.8 F (36.6 C)  Resp 12  Ht 6\' 1"  (1.854 m)  Wt 86.183 kg (190 lb)  BMI 25.07 kg/m2  SpO2 95%  Patient vital signs stable. Still c/o of chest pain, admin 15mg  of morphine with good effect. Refusing nitro. Patient sleeping when reassessed. Continues on cardiac monitoring, vital signs q 4 hours. HR NSR. 60-70.Patient alert and oriented.     Problem: Thromboemboli, Risk of  Goal: Absence of signs and symptoms of Thromboemboli  Outcome: Met  Patient on scheduled aspirin treatment. Per patient ambulating regularly. NPO at midnight. Skin warm and dry, positive pedal pulses. Will reassess. Instructed patient to call nursing if any changes.    Problem: Pain - Acute  Goal: Communication of presence of pain  Outcome: Met  Patient having acute chest pain that was unrelieved by sublingual nitro. Admin 15mg  of morphine PO with good results. Patient sleeping on reassessment. Slept through most of the night.  Will continue to frequently assess patients pain control    Problem: Falls - Risk of  Goal: Absence of falls  Outcome: Met  Patient alert and oriented x 4. Steady gait. Patient became extremely agitated when bed alarm went off. Educated patient fall risks of being in hospital attached to multiple things ie IV/ cardiac monitor. Patient stated he understood and would call for assistance to get out of bed but did not want bed alarm on. Visualized patient q for safety.     Problem: Discharge Planning  Goal: Participation in care planning  Outcome: Met  Discharge plans are unclear at this time. Will keep patient updated on plan of care and any status changes. Patient is alert oriented and competent to understand disease process.

## 2012-01-16 NOTE — Plan of Care (Signed)
Problem: Pain - Acute  Goal: Communication of presence of pain  Denies any pain at this time. No prn pain med taken. Continue to do pain assessment every 4hrs.

## 2012-01-16 NOTE — Consults (Signed)
Psychiatry Attending Consult Note    Consulting Attending: Rosalva Ferron, MD  Reason for Consult: suicide risk assessment     I have discussed the case with the resident physician and have examined the patient:  See resident note for details.  Notable in history, [name] [name] is a 41 year old male with unclear psychiatric history self presented to [facility] reportedly for SI, patient now denies, transferred here for chest pain work up.  Has consistently denied suicidal thoughts or mental health issues.  Does not want mental health treatment at this time.      MSE:  Appearance/Behavior: fully dressed, talking on cel phone, irritated by psychiatry visit   Speech: wnl   Mood: "fine"   Affect: irritable  Thought Process: linear    Thought Content: denies SI, HI, AVH  Cognition: alert  Insight/Judgment: limited     Impression:   1. Mood d/o NOS  2. A comprehensive suicide risk assessment was performed and the patient was assessed to be at a low acute risk of self-harm.  Modifiable risk factors include None known.  Non-modifiable risk factors include male gender and single status.  The patient also has protective factors of future life plans and access to health care.      Recommendations:  1.  Agree with resident recs.  No need for acute psychiatric treatment.    2. Thank you for this consult. Please page [phone] with any questions.

## 2012-01-16 NOTE — Plan of Care (Signed)
Problem: Tissue Perfusion - Cardiopulmonary, Altered  Goal: Hemodynamic stability  Denies chest pain. On sinus rhythm. No ectopy noted.

## 2012-01-16 NOTE — Discharge Instructions (Signed)
AMA    You are leaving here against the doctor's advice.     I state that (I) _________________________________, am leaving the hospital against my doctor's advice. My doctor has told me that there are risks and possible consequences of this decision. These risks and problems that might be prevented if I were to stay include:     List possible consequences (Write in by hand):                  I do not hold my doctor responsible for any problems which may result from my leaving. The same applies to the hospital and its employees, officers, directors, financial managers and agents.      I have been told about the risks and dangers of leaving the hospital right now. I know that I may need to pay (even for the full visit) if my medical insurance does not pay.    Return here or go to the nearest Emergency Department if you get worse, something changes, or you change your mind. The Emergency Department (or urgent care) is always willing to continue to care for you. It is VERY IMPORTANT for you to follow up with your family doctor. If you do not have a family doctor, ask the medical workers to help you find one.

## 2012-01-16 NOTE — Plan of Care (Signed)
Problem: Falls - Risk of  Goal: Absence of falls  No falls noted.patient is independent.gait steady.will monitor pt.

## 2012-01-16 NOTE — Consults (Signed)
Psychiatry Resident Consult Note    Date of Admission: 01/15/2012  Consulting Attending: Rosalva Ferron, MD  Reason for Consult: SI eval     History of Present Illness:     [name] [name] is a 41 year old male with history of bipolar disorder admitted for cardiac monitoring.  Per records, patient self presented to [facility] tonight complaining of depression and SI.  He soon after reported chest pain, and was transported to [place] ED, and admitted to cardiology service for monitoring.  A 1:1 sitter was placed at pt's bedside, which agitated pt, and he threatened to leave.  Consult was placed for SI eval.      On interview, patient states that he went to [facility] tonight for a routine weekly check-in with his case manager, when he experienced chest pain and then had to be transferred here.  He denies stating there that he had SI.  He currently denies depressed mood, or SI.  He denies plan or intent.  Says he promises not to hurt himself, and explains "My family is [place], and when we make a promise, we keep it."  Patient names multiple future goals, including living for his niece, being present for his best friends wedding in 2 weeks, and celebrating the birth of his goddaughter in 3 days.    His only related stressor is the presence of the 1:1 sitting outside the door, explaining that he doesn't like to be treated as a prisoner.  He names multiple supports in the community, including friends in [place], his family around [place], and his case Production designer, theatre/television/film.  He denies anhedonia, guilt, low energy, difficulty concentrating, poor appetite.  Denies hopelessness.    On screen for mania/hypomania, pt explains he has been sleeping well.  Denies racing thoughts, thoughtless behavior, elevated mood.    Denies AVH/paranoia.    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  Says last smoked THC 3 weeks ago.  Denies other illicits, although [facility] records indicate previous cocaine use.    Medical History:   Past Medical History   Diagnosis Date   . Myocardial infarction    . ACS (acute coronary syndrome)    . Heart murmur    . Hypertension        Allergies:   Allergies   Allergen Reactions   . Toradol Hives   . Tramadol Hives       Medications:   Current Facility-Administered Medications   Medication   . acetaminophen (TYLENOL) tablet 650 mg   . [DISCONTINUED] aspirin chewable tablet 81 mg   . aspirin tablet 325 mg   . atorvastatin (LIPITOR) tablet 40 mg   . [COMPLETED] diazepam (VALIUM) injection 5 mg   . [COMPLETED] diazepam (VALIUM) injection 5 mg   . enoxaparin (LOVENOX) injection 40 mg   . famotidine (PEPCID) tablet 20 mg   . [COMPLETED] HYDROmorphone (DILAUDID) injection 0.5 mg   . [COMPLETED] HYDROmorphone (DILAUDID) injection 0.5 mg   . lisinopril (PRINIVIL, ZESTRIL) tablet 20 mg   . [COMPLETED] LORazepam (ATIVAN) injection 2 mg   . [COMPLETED] metoprolol (LOPRESSOR) injection 5 mg   . morphine (MSIR) tablet  15 mg   . [DISCONTINUED] nitroGLYcerin (NITROLINGUAL) spray 1 spray   . nitroGLYcerin (NITROSTAT) SL tablet 0.4 mg   . [DISCONTINUED] nitroGLYcerin (NITROSTAT) SL tablet 0.4 mg   . ondansetron (ZOFRAN) injection 4 mg   . [COMPLETED] potassium chloride (KLOR-CON) ER tablet 40 mEq   . sodium chloride (PF) 0.9 % flush 3 mL   . sodium chloride (PF) 0.9 % flush 3 mL   . [COMPLETED] sodium chloride 0.9 % 1,000 mL IV bolus   . sodium chloride 0.9% infusion       Scheduled:       . [DISCONTINUED] aspirin  81 mg Once   . aspirin  325 mg Daily   . atorvastatin  40 mg QPM   . [COMPLETED] diazepam  5 mg Once   . [COMPLETED] diazepam  5 mg Once   . enoxaparin  40 mg Daily   . famotidine  20 mg Q12H   . [COMPLETED] HYDROmorphone  0.5 mg Once   .  [COMPLETED] HYDROmorphone  0.5 mg Once   . lisinopril  20 mg Daily   . [DISCONTINUED] nitroGLYcerin  1 spray Once   . [COMPLETED] potassium chloride  40 mEq Once   . sodium chloride (PF)  3 mL Q8H   . [COMPLETED] bolus IV fluid   Once       PRN:       . acetaminophen  650 mg Q4H PRN   . [COMPLETED] LORazepam  2 mg Once PRN   . [COMPLETED] metoprolol  5 mg Once PRN   . morphine  15 mg Q4H PRN   . nitroGLYcerin  0.4 mg Q5 Min PRN   . [DISCONTINUED] nitroGLYcerin  0.4 mg Q5 Min PRN   . ondansetron  4 mg Q6H PRN   . sodium chloride (PF)  3 mL PRN   . 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  +paranoia towards Clinical research associate, answering multiple questions with "why is that important"  Insight/Judgment: fair    Assessment:  41 yo CM with h/o bipolar d/o per [facility] record admitted for cardiac monitoring.  Per records, patient self presented to [facility] tonight complaining of depression and SI, although pt denies this.  He also is reluctant to share why he is followed by a case Production designer, theatre/television/film.  However, pt currently denies depressed mood, SI, plan or intent.  He names multiple short and long term future goals. Denies past attempts.  Although he is irritable, potentially paranoid, with interview, he denies sx consistent with mania/hypomania or psychosis.      A comprehensive suicide risk assessment was performed and the patient was assessed to be at a low acute  risk of self-harm.      Modifiable risk factors include precipitating  stressors.  Non-modifiable risk factors include existing psychiatric diagnoses, male gender and divorced status.  The patient also has protective factors of future life plans, coping skills, religious beliefs, access to health care and responsibility to family and friends.    Axis I: H/o bipolar disorder per [facility] records  Axis II: deferred   Axis III: ACS, back pain  Axis IV: none    Axis V: GAF 50     Recommendations:  1.  D/c 1:1 sitter, as this is causing significant agitation in pt, which may interfere with his medical care if he then insists on leaving.  However, frequent routine nursing checks would be appropriate.  2.  No current need for 5150  3.  For pt's agitation, can offer ativan 1mg  BID prn  4.  Staffed with attending, Dr. Reece Leader, who agrees with assessment and plan  5.  Thank you for this consult.  Please page [phone] with any questions.

## 2012-01-16 NOTE — Interdisciplinary (Signed)
Patient arrived on the unit extremely agitated about presence of 1:1 sitter. Vehemently denies SI, stating "I have never been suicidal" and threatening to leave. Spoke with MD who said that it was OK for sitter to sit at doorway. Patient was open to this compromise but still agitated at her presence stating we were treating him like a prisoner. Had extensive conversation with patient that his is standard of care until it is otherwise shown patient is safe. Seen by psyche,and 1:1 sitter was discontinued. Patient stating that he wants to be treated for his medical issues. Currently calm and comfortable, remains on telemetry monitoring. Patient stated he will call for staff assistance with ambulating.

## 2012-01-16 NOTE — Discharge Summary (Signed)
Patient Name:  [name] [name]    Principal Diagnosis (required):  Chest Pain    Hospital Problem List (required):  Active Hospital Problems    Diagnosis   . Chest pain [786.50]      Resolved Hospital Problems    Diagnosis   No resolved problems to display.       Procedures Performed During This Hospitalization (required):  None    Consultations Obtained During This Hospitalization:  None    Brief Hospital Course (required):  41 year old male with bipolar disorder who presented from [facility] with complaints of chest pain.  His family history is pertinent for premature CAD.  Additional risk factors included hypertension and tobacco use.  He was admitted overnight for observation and ruled out for an acute MI.  Serial markers were negative.  The EKG was without ischemic changes.  He was going to be further risk stratified with stress imaging.  However he left AMA without explanation.      Discharge Condition (required): Stable.    Discharge Disposition:  Left hospital against medical advice.

## 2012-01-16 NOTE — Procedures (Signed)
PERFORMED ON - 01/16/2012 08:52:00;   DONE BY - MIMAMARISTELA, MICHAEL;  PROCEDURE - PDP EVALUATION;   NEURO STATUS: ALERT AND ORIENTED;   HEARTRATE: 77;   RESP RATE: 20;   HEMOGLOBIN: 13.1;   CLUBBING PRESENT: NO;   TRACHEOSTOMY PRESENT: NO;   SOB: NO;   CHEST EXCURSION: SYMMETRICAL;   PRE-TITR FIO2 OR L/M: RA;   PRE-TITR SAO2: 98;   MD REQUEST #1: OXYGEN;   RCP RECOMMENDATION: #1: O2 PROTOCOL D/C;   INDICATION: #1: OUTCOMES ACHIEVED;   EVALUATION OUTCOME: #1: AS RCP RECOMMENDED;   ADVERSE REACTIONS: NONE;   ELECTRONIC SIGNATURE DERIVED FROM A SINGLE CONTROLLED ACCESS PASSWORD:   Kristine Garbe ; 01/16/2012 11:36:36

## 2012-01-16 NOTE — Plan of Care (Signed)
Pt very anxious about leaving and MD made aware, pt unwilling to wait for primary team to round and requesting to leave AMA, pt informed of risks for leaving at this time prior to MD rounding and pt verbalized understanding, AMA form signed and MD also made aware AMA form signed.  Pt ambulatory and was discharged.

## 2012-01-16 NOTE — Plan of Care (Signed)
Problem: Discharge Planning  Goal: Participation in care planning  Patient discharge to home ambulatory.can not wait for doctor's final order.AMA form signed. Saline lock removed, site good.no sign of bleeding noted. All belonging taken. Doctor is aware of going home without instructions. Patient is rushibg to go home. Pick up by his friend downstairs. 1000 discharge in stable condition.

## 2012-01-16 NOTE — Consults (Signed)
Discussed with resident.

## 2012-01-16 NOTE — Plan of Care (Signed)
Problem: Thromboemboli, Risk of  Goal: Absence of signs and symptoms of Thromboemboli  No sign and symptom of thromboemboli noted.lovenox shot as ordered.

## 2012-01-16 NOTE — Interdisciplinary (Signed)
Patient no longer verbalizing wanting to leave. Compliant with plan of care.

## 2012-01-20 LAB — CONFIRM OPIATES, URINE
Conf. 6 Acetylmorphine: NEGATIVE ng/mL
Conf. Codeine: NEGATIVE ng/mL
Conf. EDDP: NEGATIVE ng/mL
Conf. Fentanyl: NEGATIVE ng/mL
Conf. Hydrocodone: NEGATIVE ng/mL
Conf. Hydromorphone: POSITIVE ng/mL — AB
Conf. Methadone: NEGATIVE ng/mL
Conf. Morphine: POSITIVE ng/mL — AB
Conf. Norfentanyl: NEGATIVE ng/mL
Conf. Oxycodone: NEGATIVE ng/mL
Conf. Oxymorphone: NEGATIVE ng/mL

## 2012-01-23 ENCOUNTER — Other Ambulatory Visit (HOSPITAL_BASED_OUTPATIENT_CLINIC_OR_DEPARTMENT_OTHER): Payer: Self-pay

## 2012-01-23 ENCOUNTER — Emergency Department
Admission: EM | Admit: 2012-01-23 | Discharge: 2012-01-23 | Discharge status: Against Medical Advice | Payer: Self-pay | Attending: Emergency Medicine | Admitting: Emergency Medicine

## 2012-01-23 DIAGNOSIS — R110 Nausea: Secondary | ICD-10-CM | POA: Insufficient documentation

## 2012-01-23 DIAGNOSIS — R0789 Other chest pain: Secondary | ICD-10-CM | POA: Insufficient documentation

## 2012-01-23 DIAGNOSIS — R079 Chest pain, unspecified: Secondary | ICD-10-CM

## 2012-01-23 DIAGNOSIS — Z8249 Family history of ischemic heart disease and other diseases of the circulatory system: Secondary | ICD-10-CM | POA: Insufficient documentation

## 2012-01-23 DIAGNOSIS — Z8659 Personal history of other mental and behavioral disorders: Secondary | ICD-10-CM | POA: Insufficient documentation

## 2012-01-23 DIAGNOSIS — F17200 Nicotine dependence, unspecified, uncomplicated: Secondary | ICD-10-CM | POA: Insufficient documentation

## 2012-01-23 LAB — ECG 12-LEAD
ATRIAL RATE: 78 {beats}/min
P AXIS: 58 degrees
PR INTERVAL: 106 ms
QRS INTERVAL/DURATION: 90 ms
QT: 378 ms
QTC INTERVAL: 430 ms
R AXIS: 61 degrees
T AXIS: 40 degrees
VENTRICULAR RATE: 78 {beats}/min

## 2012-01-23 LAB — BASIC METABOLIC PANEL, BLOOD
BUN: 19 mg/dL (ref 6–20)
Bicarbonate: 20 mmol/L — ABNORMAL LOW (ref 22–29)
Calcium: 9 mg/dL (ref 8.6–10.5)
Chloride: 104 mmol/L (ref 98–107)
Creatinine: 0.74 mg/dL (ref 0.67–1.17)
GFR: >60 mL/min
Glucose: 105 mg/dL (ref 70–115)
Potassium: 4.2 mmol/L (ref 3.5–5.1)
Sodium: 134 mmol/L — ABNORMAL LOW (ref 136–145)

## 2012-01-23 LAB — CBC WITH DIFF, BLOOD
ANC-Automated: 3 10*3/uL (ref 1.6–7.0)
Abs Eosinophils: 0.3 10*3/uL (ref 0.0–0.5)
Abs Lymphs: 2 10*3/uL (ref 0.8–3.1)
Abs Monos: 0.7 10*3/uL (ref 0.2–0.8)
Eosinophils: 5 % (ref 1–7)
Hct: 38.6 % — ABNORMAL LOW (ref 40.0–50.0)
Hgb: 13.2 gm/dL — ABNORMAL LOW (ref 13.7–17.5)
Lymphocytes: 34 % (ref 19–53)
MCH: 28.9 pg (ref 26.0–32.0)
MCHC: 34.2 % (ref 32.0–36.0)
MCV: 84.6 um3 (ref 79.0–95.0)
MPV: 9.7 fL (ref 9.4–12.4)
Monocytes: 11 % (ref 5–12)
Plt Count: 262 10*3/uL (ref 140–370)
RBC: 4.56 10*6/uL — ABNORMAL LOW (ref 4.60–6.10)
RDW: 14.8 % — ABNORMAL HIGH (ref 12.0–14.0)
Segs: 50 % (ref 34–71)
WBC: 5.9 10*3/uL (ref 4.0–10.0)

## 2012-01-23 LAB — TROPONIN T, BLOOD: Troponin T: <0.01 ng/mL (ref <0.01)

## 2012-01-23 LAB — PROTHROMBIN TIME, BLOOD
INR: 1.1
PT,Patient: 11.9 s (ref 9.7–12.5)

## 2012-01-23 LAB — CPK-CREATINE PHOSPHOKINASE, BLOOD: CPK: 236 U/L — ABNORMAL HIGH (ref 0–175)

## 2012-01-23 LAB — CKMB+INDEX (NO CPK), BLOOD
CK-MB Index: 1.8 % (ref 0.0–2.5)
CK-MB: 4.3 ng/mL (ref 0.0–4.8)

## 2012-01-23 LAB — MAGNESIUM, BLOOD: Magnesium: 2.2 mg/dL (ref 1.7–2.6)

## 2012-01-23 MED ORDER — ACETAMINOPHEN-CODEINE #3 300-30 MG OR TABS (CUSTOM)
1.0000 | ORAL_TABLET | Freq: Once | ORAL | Status: AC
Start: 2012-01-23 — End: 2012-01-23
  Administered 2012-01-23: 1 via ORAL
  Filled 2012-01-23: qty 1

## 2012-01-23 MED ORDER — ASPIRIN 81 MG OR CHEW
162.0000 mg | CHEWABLE_TABLET | Freq: Once | ORAL | Status: AC
Start: 2012-01-23 — End: 2012-01-23
  Administered 2012-01-23: 162 mg via ORAL
  Filled 2012-01-23: qty 2

## 2012-01-23 NOTE — ED Notes (Signed)
Blood samples collected/labeled and sent to the lab.

## 2012-01-23 NOTE — ED Notes (Signed)
62-    PT taken to x ray w/ x ray tech RN Gabe aware;

## 2012-01-23 NOTE — ED Notes (Signed)
Pt okay to eat per MD Valderamma. After giving pt his tray of food, pt informed of plan of care to repeat cardiac markers at 0500. Pt states he wants to go back to [facility] and NOT have repeat cardiac markers drawn. MD Valderamma notified.

## 2012-01-23 NOTE — ED Notes (Signed)
Pt dispo instructions reviewed with pt including follow up and when/where/why to seek immediate medical attention. Pt verbalized understanding. Pt VSS, NAD, abc's intact, breathing even/unlabored, speaking in full sentences, ambulated out of the ED with a steady gait.

## 2012-01-23 NOTE — ED Provider Notes (Signed)
History  Chief Complaint   Patient presents with   . Chest Pain     Pt presents with a c/o chest pain for the past 1 hour. Pt presented to [facility] at noon after he ingested an overdose of Zyprexa 60 mg. Upon presentation to the ED pt states that he is currently not suicidal.     HPI    41 year old male smoker, non-diabetic,hypertensive, with atraumatic chest pain.  Onset at 11pm of left chest pain described as a constant crushing radiating to left arm, 10/10 severity. He says he ws diagnosed previously with MI but that "nothing was done" for this.  Denies shortness of breath. denies associated diaphoresis, nausea or vomiting , palpitations. Denies fatigue or sensation of impending doom. Denies pleuritic positional or exertional pain.  Denies new syncope, tearing pain, back or abdominal pain.  Denies known cancer. Denies leg swelling or being bedridden or immobilized for long period time. Pain not alleviated by leaning forward.  Aspirin 162 taken prior to arrival.  Denies rash, fevers, chills, or sweats.  Denies cough, sneezing, rhinorrhea, or illness.  No past medical history of DVT.  Family history  Of MI in 72s.    He is sent from [facility] where he was being seen because his "voices were out of control." hx bipolar but not schizophrenia. He is on zyprexa and says he took an extra dose today, but not in an attempt to hurt himself.   Nitro placed by medics and minimally improved pain.     Past Medical History   Diagnosis Date   . Myocardial infarction    . ACS (acute coronary syndrome)    . Heart murmur    . Hypertension      Past Surgical History   Procedure Laterality Date   . Knee surgery     . Back surgery     . Surgery to the skull  from fracture     No family history on file.    History   Substance Use Topics   . Smoking status: Not on file   . Smokeless tobacco: Not on file   . Alcohol Use: Not on file     Review of Systems  No vision loss.   No hearing loss.   No speech changes.   No back pain.   No sob.   No  pruritis.   No rashes.   No confusion.   No focal weakness  No focal lack of sensation  Not dizzy or light-headed.   No epistaxis  No f/c/s.     Physical Exam  BP 103/60  Pulse 77  Temp(Src) 97 F (36.1 C)  Resp 16  SpO2 95%    Physical Exam  Vitals noted afebrile normotensive  Gen: NAD, conversational in full logical sentences, pleasant, appears stated age  HEENT: nc/at PERRL sclera anicteric EOMI mmm oropharynx clear with no exudates. neck not rigid, no LAD  Lungs: CTAB, no wheezes, no crackles, no resp distress  Cardiovascular: RRR no murmur, no significantly elevated JVP. pulses equal bilaterally in UE&LE  Abdomen: +BS, soft, Nondistended, non-tender, not rigid, no rebound, no palpable masses  Back: no cvat  Extremities: no significant LE edema. no asymmetric edema, extremities well-perfused with cap refill <3 sec.  Skin: no rashes, warm, not diaphoretic, not pale  Neuro: alert and oriented x3 at baseline, CN 2-12 intact, 5/5 strength and sensation to light touch intact throughout. gait not ataxic     ED Course/Medical Decision Making  Narrative  Chest pain.   Initial trop neg.   ekg with nsr and no new significant pathologic st elevation, t wave inversions or q waves.  Good r wave progression. cxr without obvious pna or pneumothorax. He denies SI for me.   He said he wanted to leave ama because his pain was gone. I explained to him that I wanted to have cardiology see him and have further testing done to be sure he wasn't having a heart attack or at risk for having one in the near future. I spoke with cardiology who encouraged patient to stay for further testing but he still declined. History and physical without evidence of dissection or pe.  He declined staying and voiced understanding that medical workup had not been completed and that he was leaving against medical advice and that he could die.   Unclear source of chest pain at this time. Encouraged fu with cardiology.   Patient states he does not want  to go back to [facility]. I spoke with [facility] to discuss and given patient is voluntary he is not obligated to go back. He is not on 5150. He is not suicidal on my history.   Encouraged close followup with pcp and return precautions.       Home Medication List  Prior to Admission Medications   Outpatient Medications Last Dose Informant Patient Reported? Taking?   lisinopril (PRINIVIL, ZESTRIL) 20 MG tablet   Yes No   Sig: Take 20 mg by mouth daily.      Facility-Administered Medications: None       Maleki Hippe, [place], MD  Resident  01/23/12 [phone]

## 2012-01-23 NOTE — Discharge Instructions (Signed)
Chest Pain of Unclear Etiology     You have been seen for chest pain. The cause of your pain is not yet known.     Your doctor has learned about your medical history, examined you, and checked any tests that were done. Still, it is unclear why you are having pain. The doctor thinks there is only a very small chance that your pain is caused by a life-threatening condition. Later, your primary care doctor might do more tests or check you again.     Sometimes chest pain is caused by a dangerous condition, like a heart attack, aorta injury, blood clot in the lung, or collapsed lung. It is unlikely that your pain is caused by a life-threatening condition if: Your chest pain lasts only a few seconds at a time; you are not short of breath, nauseated (sick to your stomach), sweaty, or lightheaded; your pain gets worse when you twist or bend; your pain improves with exercise or hard work.     Chest pain is serious. It is VERY IMPORTANT that you follow up with your regular doctor and seek medical attention immediately here or at the nearest Emergency Department if your symptoms become worse or they change.     YOU SHOULD SEEK MEDICAL ATTENTION IMMEDIATELY, EITHER HERE OR AT THE NEAREST EMERGENCY DEPARTMENT, IF ANY OF THE FOLLOWING OCCURS:  · Your pain gets worse.  · Your pain makes you short of breath, nauseated, or sweaty.  · Your pain gets worse when you walk, go up stairs, or exert yourself.  · You feel weak, lightheaded, or faint.  · It hurts to breathe.  · Your leg swells.  · Your symptoms get worse or you have new symptoms or concerns.

## 2012-01-23 NOTE — ED Notes (Signed)
Pt's belongings returned to pt from psych locker

## 2012-01-23 NOTE — ED Notes (Signed)
1610-    ECG completed and handed to RN Gabe to hand to Dr. Bobette Mo; Copy handed to RN gabe to place in the chart;

## 2012-01-23 NOTE — ED Notes (Signed)
Pt continues to express desire to leave AMA. MD valderamma at bedside, explained risks of leaving AMA including death. Pt signed AMA form, this RN witnessed.  Pt currently on phone with [facility].  Pt VSS, NAD, breathing is even and non-labored. Rates CP 5/10 at this time. Remains on full cardiac monitor. NSR with no noted ectopy.

## 2012-01-23 NOTE — ED Attending Note (Signed)
Patient was seen and discussed with resident.     CC. Chest Pain      HPI. Patient is a 41 year old male presents with chest pain radiating to neck and shoulder assoc with nausea. States he took and extra dose of zyprexa due to anxiety.     Past Medical History   Diagnosis Date   . Myocardial infarction    . ACS (acute coronary syndrome)    . Heart murmur    . Hypertension      Past Surgical History   Procedure Laterality Date   . Knee surgery     . Back surgery     . Surgery to the skull  from fracture     Current Facility-Administered Medications   Medication   . [COMPLETED] aspirin chewable tablet 162 mg     Current Outpatient Prescriptions   Medication Sig   . lisinopril (PRINIVIL, ZESTRIL) 20 MG tablet Take 20 mg by mouth daily.     Allergies   Allergen Reactions   . Toradol Hives   . Tramadol Hives     No family history on file.    EXAM  BP 103/60  Pulse 77  Temp(Src) 97 F (36.1 C)  Resp 16  SpO2 96%  GEN NAD  HEENT EOMI PERRL anicteric OP clear  NECK No LAN, No thyromegaly  CHEST CTAB, no chest wall tenderness  CVS RRR, no MRG, well perfused in all extremities  ABD soft NDNT  SKIN no rash  BACK NT  EXT no edema or tenderness with strong symmetric peripheral pulses    ECG INTERPRETATION  Rate: 78  Rhythm: sinus   Axis: normal   No ischemia or injury    IMPRESSION  Chest pain     PLAN   Labs  CXR  ECG  Reassess

## 2012-01-23 NOTE — ED Notes (Signed)
Signed out to me, 41 yo from [facility] who took extra dose of zyprexa, also with chest pain.  Pending second set of cardiac markers and reassessment.     Kevin Fenton, MD  01/23/12 [phone]

## 2012-01-23 NOTE — ED Notes (Signed)
EKG @ bedside

## 2012-01-23 NOTE — ED Notes (Addendum)
Assisting primary RN:    -Pt to and from CXR via gurney bed, appears quite calm and in NAD, pleasant.  Pt placed back to full cardiac monitoring c alarms on/audible-HR 70s NSR, no ectopy, SpO2 96% RA sats, placed to 2L/min O2 via NC, improved to 99-100% with O2.  Pt endorses 8/10 L chest/shoulder pain, constant at rest despite treatments given thus far.  Pt presentation does NOT coincide with pt stated pain level.  Pt endorses he smokes 1/2 ppd/51yrs (used to smoke 2 ppd, per pt).  He is breathing evenly and non-labored, skins p/w/d, no LE edema noted.  Dr Rosina Lowenstein made aware of pt complaints.

## 2012-01-27 NOTE — ED Follow-up Note (Signed)
Follow-up type: Callback       Routine ED Patient Call Back    Patient unable to be contacted, no message left  # in system disconnected.

## 2012-09-13 ENCOUNTER — Inpatient Hospital Stay (HOSPITAL_COMMUNITY): Payer: Self-pay | Admitting: Cardiology

## 2012-09-13 ENCOUNTER — Inpatient Hospital Stay
Admission: EM | Admit: 2012-09-13 | Discharge: 2012-09-14 | DRG: 313 | Disposition: A | Discharge status: Home / Self Care | Payer: Self-pay | Attending: Cardiology | Admitting: Cardiology

## 2012-09-13 DIAGNOSIS — R45851 Suicidal ideations: Secondary | ICD-10-CM

## 2012-09-13 DIAGNOSIS — R079 Chest pain, unspecified: Secondary | ICD-10-CM

## 2012-09-13 DIAGNOSIS — I10 Essential (primary) hypertension: Secondary | ICD-10-CM | POA: Diagnosis present

## 2012-09-13 DIAGNOSIS — F319 Bipolar disorder, unspecified: Secondary | ICD-10-CM | POA: Diagnosis present

## 2012-09-13 DIAGNOSIS — F39 Unspecified mood [affective] disorder: Secondary | ICD-10-CM

## 2012-09-13 DIAGNOSIS — F1910 Other psychoactive substance abuse, uncomplicated: Secondary | ICD-10-CM | POA: Diagnosis present

## 2012-09-13 DIAGNOSIS — R0789 Other chest pain: Principal | ICD-10-CM | POA: Diagnosis present

## 2012-09-13 DIAGNOSIS — Z720 Tobacco use: Secondary | ICD-10-CM

## 2012-09-13 LAB — CBC WITH DIFF, BLOOD
ANC-Automated: 4.7 10*3/uL (ref 1.6–7.0)
Abs Eosinophils: 0.1 10*3/uL (ref 0.0–0.5)
Abs Lymphs: 1.6 10*3/uL (ref 0.8–3.1)
Abs Monos: 0.8 10*3/uL (ref 0.2–0.8)
Eosinophils: 1 % (ref 1–7)
Hct: 40 % (ref 40.0–50.0)
Hgb: 13.5 gm/dL — ABNORMAL LOW (ref 13.7–17.5)
Lymphocytes: 22 % (ref 19–53)
MCH: 27.8 pg (ref 26.0–32.0)
MCHC: 33.8 % (ref 32.0–36.0)
MCV: 82.3 um3 (ref 79.0–95.0)
MPV: 9.1 fL — ABNORMAL LOW (ref 9.4–12.4)
Monocytes: 11 % (ref 5–12)
Plt Count: 316 10*3/uL (ref 140–370)
RBC: 4.86 10*6/uL (ref 4.60–6.10)
RDW: 15.2 % — ABNORMAL HIGH (ref 12.0–14.0)
Segs: 66 % (ref 34–71)
WBC: 7.2 10*3/uL (ref 4.0–10.0)

## 2012-09-13 LAB — COMPREHENSIVE METABOLIC PANEL, BLOOD
ALT (SGPT): 13 U/L (ref 0–41)
AST (SGOT): 13 U/L (ref 0–40)
Albumin: 4.3 g/dL (ref 3.5–5.2)
Alkaline Phos: 88 U/L (ref 40–129)
BUN: 16 mg/dL (ref 6–20)
Bicarbonate: 26 mmol/L (ref 22–29)
Bilirubin, Tot: 0.2 mg/dL (ref <1.2)
Calcium: 9.6 mg/dL (ref 8.6–10.0)
Chloride: 101 mmol/L (ref 98–107)
Creatinine: 0.79 mg/dL (ref 0.67–1.17)
GFR: >60 mL/min
Glucose: 105 mg/dL (ref 70–115)
Potassium: 3.8 mmol/L (ref 3.5–5.1)
Sodium: 138 mmol/L (ref 136–145)
Total Protein: 6.7 g/dL (ref 6.0–8.0)

## 2012-09-13 LAB — APTT, BLOOD: PTT: 31.4 s (ref 25.0–34.0)

## 2012-09-13 LAB — TROPONIN T, BLOOD: Troponin T: <0.01 ng/mL (ref <0.01)

## 2012-09-13 LAB — CKMB+INDEX (NO CPK), BLOOD
CK-MB Index: 1.8 %
CK-MB: 4.1 ng/mL (ref 0.0–4.8)

## 2012-09-13 LAB — PROTHROMBIN TIME, BLOOD
INR: 1.1
PT,Patient: 11.7 s (ref 9.7–12.5)

## 2012-09-13 LAB — CPK-CREATINE PHOSPHOKINASE, BLOOD: CPK: 225 U/L — ABNORMAL HIGH (ref 0–175)

## 2012-09-13 MED ORDER — SODIUM CHLORIDE 0.9 % IV BOLUS
500.00 mL | INJECTION | Freq: Once | INTRAVENOUS | Status: AC
Start: 2012-09-13 — End: 2012-09-13
  Administered 2012-09-13: 500 mL via INTRAVENOUS

## 2012-09-13 NOTE — ED Notes (Signed)
Bed: 14A  Expected date:   Expected time:   Means of arrival: Basic/[facility]  Comments:  5150 [facility]/59M cp

## 2012-09-13 NOTE — ED Notes (Signed)
Patients belongings charted and locked up in the psych locker by tech.

## 2012-09-13 NOTE — ED Notes (Signed)
MD Mummert at bedside, Pt states a little dizziness, states he is sleepy since he had ativan & [facility]

## 2012-09-13 NOTE — ED Notes (Signed)
Pt is very sleepy. MD is present.

## 2012-09-13 NOTE — ED Notes (Signed)
Pt w/ irregular rhythm SB 40's-sinus rythem in the 60's, pt sleeping, appears comfortable, BP 90's systolic, MD Mummert notified of irregular heart beats, sitter at bedside

## 2012-09-13 NOTE — ED Notes (Signed)
Pt sleeping in gurney, appears comfortable, nsr 60's on monitor/audible, skin warm, dry, normal for color/ethnicity, sitter at bedside

## 2012-09-13 NOTE — ED Notes (Signed)
Lowest HR 39 sinus arrythmia, MD Mummert notified

## 2012-09-13 NOTE — ED Notes (Signed)
ASSISTING PRIMARY RN:   PIV started to pt RUA w/18G on attempt x2, bloods drawn and sent to lab as ordered. Pt tolerated well. Dr Toma Copier at pt bedside. Pt bed locked, low position, rails x2 up for safety. Call bell at pt reach. Pt wearing [place] white checkered wristband. Sitter at pt bedside.

## 2012-09-13 NOTE — ED Notes (Addendum)
EKG Performed and handed to MD Mummert. Extra copy placed in the patients chart.

## 2012-09-13 NOTE — ED Notes (Signed)
-  late entry, pt brought here from [facility] for med clearance, on a 5150 for SI, reports SI/AH/VH, denies HI, states CP started a few hrs ago, also w/ sob/dizziness,  breathing non-lab, even, spo2 95% on ra, skin warm, dry, normal for color/ethnicity, nsr 60's-70's on monitor/audible, hx of cardiac stent & MI per pt, calm/cooperative, sitter at bedside, SI precautions in place

## 2012-09-13 NOTE — ED Notes (Signed)
Repeat EKG performed and handed to MD. Extra copy placed in the patients chart.

## 2012-09-13 NOTE — ED Attending Note (Signed)
ED ATTENDING NOTE:    Case d/w Dr. Toma Copier    HPI: Pt is a 43 year old male with h/o mood disorder, subst abuse, currently on 5150 for SI who is sent to ED from [facility] for CP. Pt is a very poor historian and can not describe the pain well. In review of the med rec, he has been to this ED and others in the area for atypical CP and ultimately often leaves AMA before stress test etc can be complete. Pain was non-radiating. Unclear if it was exertional. No f/c/s. No URI sx, no cough. No pleuritic pain. No SOB. No h/o DVT/PE. No leg pain no recent travel or immobilization. No recent trauma. Pt states he has no pain at this time. Says it began at rest. Pt is somnolent during my interview. He says he uses MJ and occasionally cocaine.       ED Triage Vitals   Enc Vitals Group      Blood pressure (BP) 09/13/12 2029 116/64 mmHg      Heart Rate 09/13/12 2029 66      Respirations 09/13/12 2029 12      Temp --       Temp src --       SpO2 09/13/12 1920 95 %      Weight - scale 09/13/12 1909 185 lb (83.915 kg)      Height 09/13/12 1909 6\' 1"  (1.854 m)      Head Cir --       Peak Flow --       Pain Score --       Pain Loc --       Pain Edu? --       Excl. in GC? --      VS: noted and nl, O2 sat is nl  Temp not yet documented    PE:   NAD, sleepy but arousable  NCAT  Neck no JVD  RRR, no murm  no chest wall ttp  CTAB, nl RR, no access muscle use  Abd soft NTND, no r/g  Back no CVAT  Extrs WWP no edema, no calf TTP  skin warm dry no lesions   Rad pulses 2+ bilat  Neuro awake alert MAE, nl gait    Cardiac Monitor: sinus rhythm, no ectopy    ECG: I have seen the ECG tracing and I agree w/ resident MD interpretation.     Impression: Chest pain x 1 day (?). Pt is aporr historian. Hx and exam not entirely clear, but ACS/cardiac etiology remains a possibility. Low suspicion for PTX, PE. Pt is low risk for PE via clinical exam, as well as Well's criteria. Pt w/ CV RFs, and uses cocaine.     Agree w/ Res MD A/P for:  Monitor, CXR, ECG, labs,  and reassess closely. We will d/w Cards service and likely admit for cardiac stress test as this has been ordered in recent past but never obtained.

## 2012-09-13 NOTE — ED Notes (Signed)
Sitter remains at bedside

## 2012-09-14 DIAGNOSIS — F1910 Other psychoactive substance abuse, uncomplicated: Secondary | ICD-10-CM | POA: Diagnosis present

## 2012-09-14 DIAGNOSIS — F39 Unspecified mood [affective] disorder: Secondary | ICD-10-CM

## 2012-09-14 DIAGNOSIS — F17200 Nicotine dependence, unspecified, uncomplicated: Secondary | ICD-10-CM

## 2012-09-14 LAB — ECG 12-LEAD
ATRIAL RATE: 60 {beats}/min
ATRIAL RATE: 87 {beats}/min
ATRIAL RATE: 50 {beats}/min
ATRIAL RATE: 51 {beats}/min
ECG INTERPRETATION: NORMAL
ECG INTERPRETATION: NORMAL
P AXIS: 28 degrees
P AXIS: 73 degrees
P AXIS: 21 degrees
P AXIS: 70 degrees
PR INTERVAL: 116 ms
PR INTERVAL: 142 ms
PR INTERVAL: 130 ms
PR INTERVAL: 150 ms
QRS INTERVAL/DURATION: 104 ms
QRS INTERVAL/DURATION: 98 ms
QRS INTERVAL/DURATION: 104 ms
QRS INTERVAL/DURATION: 104 ms
QT: 348 ms
QT: 392 ms
QT: 414 ms
QT: 426 ms
QTC INTERVAL: 392 ms
QTC INTERVAL: 418 ms
QTC INTERVAL: 377 ms
QTC INTERVAL: 392 ms
R AXIS: 57 degrees
R AXIS: 59 degrees
R AXIS: 65 degrees
R AXIS: 70 degrees
T AXIS: 13 degrees
T AXIS: 9 degrees
T AXIS: 28 degrees
T AXIS: 35 degrees
VENTRICULAR RATE: 60 {beats}/min
VENTRICULAR RATE: 87 {beats}/min
VENTRICULAR RATE: 50 {beats}/min
VENTRICULAR RATE: 51 {beats}/min

## 2012-09-14 LAB — HEMOGRAM, BLOOD
Hct: 40.3 % (ref 40.0–50.0)
Hgb: 13.5 gm/dL — ABNORMAL LOW (ref 13.7–17.5)
MCH: 27.8 pg (ref 26.0–32.0)
MCHC: 33.5 % (ref 32.0–36.0)
MCV: 83.1 um3 (ref 79.0–95.0)
MPV: 9.3 fL — ABNORMAL LOW (ref 9.4–12.4)
Plt Count: 299 10*3/uL (ref 140–370)
RBC: 4.85 10*6/uL (ref 4.60–6.10)
RDW: 15.4 % — ABNORMAL HIGH (ref 12.0–14.0)
WBC: 4.5 10*3/uL (ref 4.0–10.0)

## 2012-09-14 LAB — MAGNESIUM, BLOOD: Magnesium: 2.2 mg/dL (ref 1.7–2.6)

## 2012-09-14 LAB — CPK-CREATINE PHOSPHOKINASE, BLOOD
CPK: 214 U/L — ABNORMAL HIGH (ref 0–175)
CPK: 209 U/L — ABNORMAL HIGH (ref 0–175)

## 2012-09-14 LAB — PROTHROMBIN TIME, BLOOD
INR: 1.2
PT,Patient: 12.1 s (ref 9.7–12.5)

## 2012-09-14 LAB — TSH, BLOOD: TSH: 0.69 u[IU]/mL (ref 0.27–4.20)

## 2012-09-14 LAB — TROPONIN T, BLOOD
Troponin T: <0.01 ng/mL (ref <0.01)
Troponin T: <0.01 ng/mL (ref <0.01)

## 2012-09-14 LAB — CKMB+INDEX (NO CPK), BLOOD
CK-MB Index: 1.9 %
CK-MB Index: 2 %
CK-MB: 4 ng/mL (ref 0.0–4.8)
CK-MB: 4.2 ng/mL (ref 0.0–4.8)

## 2012-09-14 LAB — APTT, BLOOD: PTT: 30.8 s (ref 25.0–34.0)

## 2012-09-14 MED ORDER — ASPIRIN 81 MG OR CHEW
81.0000 mg | CHEWABLE_TABLET | Freq: Every day | ORAL | Status: DC
Start: 2012-09-14 — End: 2012-09-14
  Administered 2012-09-14: 81 mg via ORAL
  Filled 2012-09-14: qty 1

## 2012-09-14 MED ORDER — NALOXONE HCL 0.4 MG/ML IJ SOLN
0.1000 mg | INTRAMUSCULAR | Status: DC | PRN
Start: 2012-09-14 — End: 2012-09-14

## 2012-09-14 MED ORDER — NITROGLYCERIN 0.4 MG SL SUBL
0.4000 mg | SUBLINGUAL_TABLET | SUBLINGUAL | Status: DC | PRN
Start: 2012-09-14 — End: 2012-09-14
  Administered 2012-09-14: 0.4 mg via SUBLINGUAL
  Filled 2012-09-14: qty 25

## 2012-09-14 MED ORDER — ENOXAPARIN SODIUM 40 MG/0.4ML SC SOLN
40.0000 mg | Freq: Every day | SUBCUTANEOUS | Status: DC
Start: 2012-09-14 — End: 2012-09-14
  Administered 2012-09-14: 40 mg via SUBCUTANEOUS
  Filled 2012-09-14: qty 0.4

## 2012-09-14 MED ORDER — HYDROCODONE-ACETAMINOPHEN 5-325 MG OR TABS
1.0000 | ORAL_TABLET | Freq: Four times a day (QID) | ORAL | Status: DC | PRN
Start: 2012-09-14 — End: 2012-09-14
  Administered 2012-09-14: 1 via ORAL
  Filled 2012-09-14: qty 1

## 2012-09-14 MED ORDER — SODIUM CHLORIDE 0.9 % IV SOLN
INTRAVENOUS | Status: DC | PRN
Start: 2012-09-14 — End: 2012-09-14

## 2012-09-14 MED ORDER — ALUM & MAG HYDROXIDE-SIMETH 200-200-20 MG/5ML OR SUSP
30.0000 mL | Freq: Four times a day (QID) | ORAL | Status: DC | PRN
Start: 2012-09-14 — End: 2012-09-14

## 2012-09-14 MED ORDER — MAGNESIUM HYDROXIDE 400 MG/5ML OR SUSP
30.0000 mL | Freq: Every evening | ORAL | Status: DC | PRN
Start: 2012-09-14 — End: 2012-09-14

## 2012-09-14 MED ORDER — SODIUM CHLORIDE 0.9 % IJ SOLN (CUSTOM)
3.0000 mL | Freq: Three times a day (TID) | INTRAMUSCULAR | Status: DC
Start: 2012-09-14 — End: 2012-09-14
  Administered 2012-09-14: 3 mL via INTRAVENOUS

## 2012-09-14 MED ORDER — SODIUM CHLORIDE 0.9 % IJ SOLN (CUSTOM)
3.0000 mL | INTRAMUSCULAR | Status: DC | PRN
Start: 2012-09-14 — End: 2012-09-14

## 2012-09-14 MED ORDER — ATORVASTATIN CALCIUM 20 MG OR TABS
20.00 mg | ORAL_TABLET | Freq: Every evening | ORAL | Status: AC
Start: 2012-09-14 — End: ?

## 2012-09-14 MED ORDER — ACETAMINOPHEN 325 MG PO TABS
650.0000 mg | ORAL_TABLET | ORAL | Status: DC | PRN
Start: 2012-09-14 — End: 2012-09-14

## 2012-09-14 MED ORDER — ZOLPIDEM TARTRATE 5 MG OR TABS
5.0000 mg | ORAL_TABLET | Freq: Every evening | ORAL | Status: DC | PRN
Start: 2012-09-14 — End: 2012-09-14

## 2012-09-14 MED ORDER — FENTANYL CITRATE 0.05 MG/ML IJ SOLN
12.5000 ug | INTRAMUSCULAR | Status: DC | PRN
Start: 2012-09-14 — End: 2012-09-14

## 2012-09-14 MED ORDER — ONDANSETRON HCL 4 MG/2ML IV SOLN
4.0000 mg | Freq: Four times a day (QID) | INTRAMUSCULAR | Status: DC | PRN
Start: 2012-09-14 — End: 2012-09-14

## 2012-09-14 MED ORDER — ATORVASTATIN CALCIUM 40 MG OR TABS
80.0000 mg | ORAL_TABLET | Freq: Every evening | ORAL | Status: DC
Start: 2012-09-14 — End: 2012-09-14

## 2012-09-14 MED ORDER — LORAZEPAM 2 MG/ML IJ SOLN
0.5000 mg | INTRAMUSCULAR | Status: DC | PRN
Start: 2012-09-14 — End: 2012-09-14

## 2012-09-14 MED ORDER — SODIUM CHLORIDE 0.9 % IV BOLUS
500.00 mL | INJECTION | Freq: Once | INTRAVENOUS | Status: AC
Start: 2012-09-14 — End: 2012-09-14
  Administered 2012-09-14: 500 mL via INTRAVENOUS

## 2012-09-14 MED ORDER — ASPIRIN 81 MG OR CHEW
81.00 mg | CHEWABLE_TABLET | Freq: Every day | ORAL | Status: AC
Start: 2012-09-14 — End: ?

## 2012-09-14 NOTE — ED Floor Report (Signed)
[name] [name] is a 42 year old male    Chief Complaint   Patient presents with   . Chest Pain     Arrives via medic from [facility]. pt was seen at [facility] yesterday for same and AMA'd then went to [facility] for SI. now on a hold.       Admitted for There are no admission diagnoses documented for this encounter.    Code Status:  Please refer to In-pt admitting doctors orders     Past Medical History   Diagnosis Date   . Myocardial infarction    . ACS (acute coronary syndrome)    . Heart murmur    . Hypertension        Past Surgical History   Procedure Laterality Date   . Knee surgery     . Back surgery     . Surgery to the skull  from fracture       Allergies: Toradol and Tramadol    Level of Care : med surg w/ cardiac monitoring    Fall Risk: no    Skin issues:  no    >> If yes, note areas of skin breakdown. See appropriate photos.      Ambulatory:  yes    Sitter needed: yes SI from [facility] on 5150    Suicide Risk :  yes    Isolation Required: no     >> If yes , what type of isolation:    Is patient in custody?  no    Is patient in restraints? no    Is patient on Heparin? no If yes, complete below:   Bolus=    Units      Units/hr   @   Mls/hour      Time of next PT/PTT draw:          Brief Summary of ED Visit (to include focused assessment and neuro status):  -late entry, pt brought here from [facility] for med clearance, on a 5150 for SI, reports SI/AH/VH, denies HI, states CP started a few hrs ago, also w/ sob/dizziness,  breathing non-lab, even, spo2 95% on ra, skin warm, dry, normal for color/ethnicity, nsr 60's-70's on monitor/audible, hx of cardiac stent & MI per pt, calm/cooperative, sitter at bedside, SI precautions in place      See  RN SHIFT ASSESSMENT ADULT  for full assessment, exceptions will be listed.    Cardiac rhythm:  Sinus arythmia 39-70    Oxygen Delivery: None    Filed Vitals:    09/13/12 2126 09/13/12 2315 09/14/12 0030 09/14/12 0108   BP: 99/42 106/64 88/49 88/47    Pulse: 67 47 56 54   Temp: 98.4 F (36.9 C)      Resp:  14 14 11 15    Height:       Weight:       SpO2: 95% 95% 96% 95%       Lab Results   Component Value Date    WBC 7.2 09/13/2012    RBC 4.86 09/13/2012    HGB 13.5* 09/13/2012    HCT 40.0 09/13/2012    MCV 82.3 09/13/2012    MCHC 33.8 09/13/2012    RDW 15.2* 09/13/2012    PLT 316 09/13/2012    MPV 9.1* 09/13/2012       Lab Results   Component Value Date    NA 138 09/13/2012    K 3.8 09/13/2012    CL 101 09/13/2012    BICARB 26 09/13/2012  BUN 16 09/13/2012    CREAT 0.79 09/13/2012    GLU 105 09/13/2012    [place] 9.6 09/13/2012       No results found for this basename: BNP, PHOS, MG, LACTATE, AMMONIA, IONCA, ARTIONCA       Lab Results   Component Value Date    CPK 214* 09/14/2012    CPK 225* 09/13/2012    CKMBH 4.1 09/13/2012    TROPONIN <0.01 09/13/2012       No results found for this basename: PH, PCO2, O2CONTENT, IVHC3, IVBE, O2SAT, UNPH, UNPCO2, ARTPH, ARTPCO2, ARTO2CNT, IAHC3, IABE, ARTO2SAT, UNAPH, UNAPCO2       No results found for this visit on 09/13/12.    RADIOLOGIC STUDIES NOT COMPLETED: none  (none unless otherwise noted)    Active PICC Line / CVC Line / PIV Line / Drain / Airway / Intraosseous Line / Epidural Line / ART Line / Line Type / Wound    Name: Placement date: Placement time: Site: Days:    Peripheral IV - 18 G Right Arm 09/13/12  1923  Arm  less than 1            Any significant events and interventions with responses:  Pt was brady 39 lowest & 40's while asleep, BP 88 systolic giving fluid now, MD aware      Floor nurse informed that report is ready for review.  Opportunity to answer questions with floor RN face to face or by phone. [place] [id number] . ER RN Ky Barban to be contacted for any questions.    Has this report been updated?  no  By Who:   Time:   Additional information by updated user:

## 2012-09-14 NOTE — H&P (Signed)
Chief Complaint   Patient presents with   . Chest Pain     Arrives via medic from [facility]. pt was seen at [facility] yesterday for same and AMA'd then went to [facility] for SI. now on a hold.       HPI: [name] [name] is a 42 year old male with a history of HTN, who was sent from [facility] for medical clearance. He currently is on hold at [facility] for SI. Previously, he has did not complete any cardiac work up due to leaving AMA. He was sent here for further evaluation of chest pain. He denies any chest pain at this point, but mentions that he gets left sided crushing chest pain, non radiating, about once a week in frequency (in prior ER notes, chest pain is noted to be radiating to the jaw and L arm, associated with diaphoresis.) Currently he is either falling asleep or becomes very agitated, therefore it was difficult to get a more detailed history (was given iv ativan by [facility] prior to being sent to [place]).    He failed a CTA coronary attempt due to claustrophobia. From prior notes, he has not been evaluated with a stress test or a coronary angiogram before.     PMH:  HTN  Bipolar disorder    Past Surgical History   Procedure Laterality Date   . Knee surgery     . Back surgery     . Surgery to the skull  from fracture     Family History per old records:   Brother with MI    History     Social History   . Marital Status: Single     Spouse Name: N/A     Number of Children: N/A   . Years of Education: N/A     Occupational History   . Not on file.     Social History Main Topics   . Smoking status: Not on file   . Smokeless tobacco: Not on file   . Alcohol Use: Not on file   . Drug Use: Not on file   . Sexually Active: Not on file     Other Topics Concern   . Not on file     Social History Narrative   . No narrative on file     No current facility-administered medications on file prior to encounter.     Current Outpatient Prescriptions on File Prior to Encounter   Medication Sig Dispense Refill   . lisinopril (PRINIVIL, ZESTRIL) 20 MG tablet Take  20 mg by mouth daily.         Allergies   Allergen Reactions   . Toradol Hives   . Tramadol Hives       Filed Vitals:    09/13/12 2029 09/13/12 2126 09/13/12 2315 09/14/12 0030   BP: 116/64 99/42 106/64 88/49   Pulse: 66 67 47 56   Temp:  98.4 F (36.9 C)     Resp: 12 14 14 11    Height:       Weight:       SpO2: 95% 95% 95% 96%       Physical Exam:      General Appearance: Either agitated or sleeping  Neck: Neck supple.  Heart: Regular rate and rhythm, S1 and S2 heard with no murmurs. JVP normal.  Lungs: Clear to auscultation bilaterally  Abdomen: Abdomen soft, non-tender and non-distended.   Extremities: No edema.    Lab Results   Component Value Date  BUN 16 09/13/2012    CREAT 0.79 09/13/2012    CL 101 09/13/2012    NA 138 09/13/2012    K 3.8 09/13/2012    May 9.6 09/13/2012    TBILI 0.2 09/13/2012    ALB 4.3 09/13/2012    TP 6.7 09/13/2012    AST 13 09/13/2012    ALK 88 09/13/2012    BICARB 26 09/13/2012    ALT 13 09/13/2012    GLU 105 09/13/2012      Lab Results   Component Value Date    WBC 7.2 09/13/2012    RBC 4.86 09/13/2012    HGB 13.5 09/13/2012    HCT 40.0 09/13/2012    MCV 82.3 09/13/2012    MCHC 33.8 09/13/2012    RDW 15.2 09/13/2012    PLT 316 09/13/2012    PLT 292 05/13/2006    MPV 9.1 09/13/2012     Lab Results   Component Value Date    INR 1.1 09/13/2012    PTT 31.4 09/13/2012     Lab Results   Component Value Date    CPK 225* 09/13/2012    CKMBH 4.1 09/13/2012    TROPONIN <0.01 09/13/2012      Lab Results   Component Value Date    HDL 38 01/16/2012    LDLCALC 39 01/16/2012    TRIG 56 01/16/2012      ECG: NSR,  Normal R wave progression, no q waves, inferior T wave flattening    Assessment and Plan: [name] [name] is a 42 year old male with a history of HTN presents from [facility] on hold for SI, for further evaluation of chest pain. He seems to have chronic angina with weekly episodes of chest pain. He is currently chest pain free with normal cardiac markers and no significant ECG changes. We will admit for rule out for ACS and  further evaluation of chest pain    #Chest pain, concerning for chronic angina  -will heparinize if chest pain recurs, currently denies any pain  -repeat cardiac markers in am  -ASA 81mg  qday (allergy to toradol in chart, previously received ASA in ER, no reactions noted)   -lipitor 80mg  qday  -TTE  -will keep NPO for possible cath in am given risk factors and typical symptoms, to be discussed with patient once he is more awake in am (vs stress test if patient refusing cath)    #SI  -sitter  -ativan 1mg  q2h PRN agitation  -hold will expire in 72 hours, will notify psychiatry to reevaluate patient for renewal vs release of hold then. If remains on hold, once medically cleared, can go back to [facility].     #HTN  -on lisinopril per chart, currently BP 90/50 following nitro paste, will hold lisinopril  -was given 1L of NS in the ER    Will discuss with Dr Jennette Bill in am.       ATTENDING NOTE ATTESTATION  Patient was interviewed and examined with the fellow and PA on rounds today. I was present for key elements of the history, exam, and medical decision making.  I have personally reviewed all available cardiac studies.  Agree with the note above.  My additions/revisions are included.    Subjective   I have reviewed the history and agree with above. Briefly: 33M who was admitted overnight complaining of chest pain. Now refusing stress testing. Denies chest pain.      Objective   I have examined the patient and concur with the  above exam. HR 50-70s, SBP 100s. No acute distress. Neck veins flat. Clear lungs. No murmurs. 2+ pulses. No edema.      Assessment and Plan   I agree with the above care plan. Atypical chest pain. No evidence of an acute coronary syndrome. Patient refusing stress testing. Psych to eval further given he is on psych hold.   See the above note for further details.    My above recommendations will be communicated with the referring physician by way of letter or the electronic medical record.    Please don't  hesitate to page me to discuss the care of this patient.    Christell Faith, MD  Assistant Clinical Professor of Medicine  [facility]    Pager [phone]

## 2012-09-14 NOTE — ED Notes (Addendum)
MD Medak & Mummert at bedside EKG preformed by Riki Rusk tech handed to MD [facility]

## 2012-09-14 NOTE — Discharge Summary (Addendum)
Patient Name:  [name] [name]    Principal Diagnosis (required):  Atypical Chest Pain    Hospital Problem List (required):  There are no hospital problems to display for this patient.      Additional Hospital Diagnoses ("rule out" or "suspected" diagnoses, etc.):  Suspected Drug Seeking Behavior    Principal Procedure During This Hospitalization (required):  None    Other Procedures Performed During This Hospitalization (required):  None    Procedure results are available in Chart Review in Epic.  For those providers external to [facility], the key procedure results are listed below:  N/A    Consultations Obtained During This Hospitalization:  Psychiatry    Key consultant recommendations:  Psychiatry recs:  Assessment:   1. Mood d/o NOS - likely substance induced mood disorder   2. Probable stimulant intoxication by history   3. A comprehensive suicide risk assessment was performed and the patient was assessed to be at a low acute risk of self-harm. Modifiable risk factors include intoxication and precipitating stressors. Non-modifiable risk factors include existing psychiatric diagnoses and male gender. The patient also has protective factors of access to health care.   Recommendations:   1. Will discontinue 5150 as patient currently denies suicidal or homicidal intent and is not gravely disabled. Patient reports SI occur only when he is using drugs. Pt was counseled to avoid substance abuse and pt was agreeable.   2. OK to discharge when medically stable.   3. Thank you for this consult. Please page [phone] with any questions.     Reason for Admission to the Hospital / History of Present Illness:  [name] [name] is a 42 year old male with a history of HTN, who was sent from [facility] for medical clearance. He currently is on hold at [facility] for SI. Previously, he has did not complete any cardiac work up due to leaving AMA. He was sent here for further evaluation of chest pain. He denies any chest pain at this point, but mentions that he  gets left sided crushing chest pain, non radiating, about once a week in frequency (in prior ER notes, chest pain is noted to be radiating to the jaw and L arm, associated with diaphoresis.) Currently he is either falling asleep or becomes very agitated, therefore it was difficult to get a more detailed history (was given iv ativan by [facility] prior to being sent to [place]).   He failed a CTA coronary attempt due to claustrophobia. From prior notes, he has not been evaluated with a stress test or a coronary angiogram before.     Hospital Course by Problem (required):  #Chest pain- the patient was admitted for atypical chest pain which was constant, not relieved by nitro.  During his hospitalization the patient demanded pain medications and refused nitro. When a stress test was to be performed the patient refused the exam and demanded to eat at that time. The patient was ruled out for MI with three sets of negative cardiac markers. Given the patient's atypical symptoms and low probability of having a cardiac etiology of chest pain, will schedule the patient for outpatient dobutamine echo and follow up in fellow's clinic. The patient will continue on aspirin and atorvastatin as an outpatient.     #HTN- During his hospitalization the patient was normotensive and did not receive his outpatient lisinopril. Will hold upon discharge.    #Drug seeking behavior- Throughout the patient's hospitalization the patient demanded pain medications and refused nitro for his chest pain.  Tests Outstanding at Discharge Requiring Follow Up:  Utox    Discharge Condition (required):  Stable.    Key Physical Exam Findings at Discharge:  No significant physical examination findings at the time of discharge.    Discharge Diet:  Low-salt.    Discharge Medications:  Current Discharge Medication List      START taking these medications    Details   aspirin 81 MG chewable tablet Take 1 tablet by mouth daily.  Qty: 30 tablet, Refills: 0     Associated Diagnoses: Chest pain      atorvastatin (LIPITOR) 20 MG tablet Take 1 tablet by mouth every evening.  Qty: 30 tablet, Refills: 0    Associated Diagnoses: Chest pain         STOP taking these medications       lisinopril (PRINIVIL, ZESTRIL) 20 MG tablet Comments:   Reason for Stopping:               Allergies:  Allergies   Allergen Reactions   . Toradol Hives   . Tramadol Hives       Discharge Disposition:  Home.    Discharge Code Status:  Full code / full care  This code status is not changed from the time of admission.    Follow Up Appointments:    Scheduled appointments:  Follow up in Fellow's clinic    For appointments requested for after discharge that have not yet been scheduled, refer to the Post Discharge Referrals section of the After Visit Summary.    Discharging Physician's Contact Information:  [facility] operator at [phone].    ATTENDING NOTE ATTESTATION   Patient was interviewed and examined with the fellow and PA on rounds today. I was present for key elements of the history, exam, and medical decision making. I have personally reviewed all available cardiac studies. Agree with the note above. My additions/revisions are included. Atypical chest pain in the setting of substance abuse. No evidence of an acute coronary syndrome. Patient refusing stress testing. Psych evaluated patient and feel he has capacity. Outpatient follow-up. Counseled to quit drugs and smoking.     See the above note for further details.     My above recommendations will be communicated with the referring physician by way of letter or the electronic medical record.     Please don't hesitate to page me to discuss the care of this patient.     Christell Faith, MD   Assistant Clinical Professor of Medicine  [facility]   Pager [phone]

## 2012-09-14 NOTE — ED Notes (Signed)
Patient is hungry and requesting some Food

## 2012-09-14 NOTE — Consults (Shared)
Psychiatry Consult Note    [name] [name]  [id number]    Date of Admisson: 09/13/2012  Date of Consult: 09/14/2012  Consulting Attending:   Reason for Consult: ***     History of Present Illness:     [name] [name] is a 42 year old male who is here for evaluation of ***      Past Psychiatric History:     1)  Diagnoses: ***  2) Suicide attempts: ***   3) Inpatient Hospitalizations: ***  4) Outpatient treatment/psychiatrist: ***   5) Medication Trials: ***    Substance Use / Treatment History:  ***    Medical History:   Past Medical History   Diagnosis Date   . Myocardial infarction    . ACS (acute coronary syndrome)    . Heart murmur    . Hypertension        Allergies:   Allergies   Allergen Reactions   . Toradol Hives   . Tramadol Hives       Medications:   No current facility-administered medications for this encounter.     Current Outpatient Prescriptions   Medication Sig   . lisinopril (PRINIVIL, ZESTRIL) 20 MG tablet Take 20 mg by mouth daily.       (Not in a hospital admission)    Social History:  ***    Family History:   ***                 MSE:   Appearance: ***   Behavior: ***  Cognition: ***  Speech: ***   Mood: ***   Affect: ***  Thought Process:***    Thought Content: ***  Insight/Judgment: ***    Pertinent Studies/Labs:   ***    Assessment:  A comprehensive suicide risk assessment was performed and the patient was assessed to be at a {low / intermediate / high:15294} acute risk of self-harm.      Modifiable risk factors include {Modifiable Risk Factors:15295}.  Non-modifiable risk factors include {Non-Modifiable Risk Factors:15298}.  The patient also has protective factors of {Protective Factors:15299}.    Axis I: ***  Axis II: ***   Axis III: ***  Axis IV:***    Axis V: GAF ***     Recommendations:  1. Recommendations are preliminary.  Consult will be staffed with attending within 24 hours.

## 2012-09-14 NOTE — Interdisciplinary (Signed)
1315-discharged in fair condition- dc instructions given and told to stop by pharmacy to pick up his medications. Refused wheel chair transport, Bus pass was provided by Child psychotherapist.

## 2012-09-14 NOTE — ED Notes (Signed)
Patients vitals continue to stay low. When asked to be updated he gets aggressive and violent. Patient doesn't want anymore vitals done and continues to get violent

## 2012-09-14 NOTE — Plan of Care (Signed)
Problem: Tissue Perfusion - Cardiopulmonary, Altered  Goal: Hemodynamic stability  SR on tele monitor. Last vitals Blood pressure 109/66, pulse 96, temperature 98.7 F (37.1 C), resp. rate 17, SpO2 98.00%.    Please see pain-acute re: chest pain.  Pt received first part of stress test but refusing to have second part completed; MD notified and aware, spoke to pt but pt continued to refuse.   Pt also refused to have echo completed. Pt states he is claustrophobic and refusing to take anything to help calm him to have studies completed.          Problem: Falls - Risk of  Goal: Absence of falls  Sitter at bedside. Fall precautions continually observed. Instructed pt to call if getting OOB or needing assistance. Hourly rounding implemented, bed locked and in lowest position, bed alarm on, beside table and call light within reach. Pt remained free of falls        Problem: Altered safety related to suicide intent/plan and increased risk for suicide  Goal: Patient is free from self harm, denies active suicidal intent or plan  Pt on 5150 hold which was d/c'd this afternoon after pt seen by psych. Sitter at bedside when on SI precautions; pt remained free of harm    Problem: Pain - Acute  Goal: Communication of presence of pain  Pt stated having no chest pain during initial shift assessment. When cardiology team at bedside approx 0900 pt stating that he has 5/10 chest pain that "feels like someone is sitting on my chest." Dr Hollice Espy instructed to give pt nitro SL which was ordered and given. Pt stated having no relief from 1 dose of nitro and refusing additional nitro stating that it didn't work for him in the past. MD paged and ordered Norco which was given; pt stated having relief of pain after norco dose.

## 2012-09-14 NOTE — Discharge Instructions (Addendum)
Diagnosis and Reason for Admission    You were admitted to the hospital for the following reason(s):  Chest Pain    Your full diagnosis list is located on this After Visit Summary in the Hospital Problems section.    What Happened During Your Hospital Stay    The main tests and treatments done for you during this hospitalization were:    --Attempted Nuclear Stress Test-   --Telemetry monitoring      The following evaluation is still important to complete after discharge from the hospital:  --please follow up with your Primary Care Physician    Instructions for After Discharge    Your diet at home should be a low-salt and low-fat diet.    Your activity level at home should be:  regular activity.    Specific activity restrictions:    Do not drive while taking narcotic pain medications.    Wound or tube care instructions:  None    Your medication list is located on this After Visit Summary in the Current Discharge Medication List section.  Your nurse will review this information with you before you leave the hospital.    It is very important for you to keep a current medication list with you in order to assist your doctors with your medical care.  Bring this After Visit Summary with you to your follow up appointments.    Reasons to Contact a Doctor Urgently    Call 911 or return to the hospital immediately:  If you have chest pain episodes more often or episodes that are more intense.  If you have NEW symptoms of trouble sleeping flat on your back due to shortness of breath.    You should contact either your primary care physician or your hospital cardiologist for any of the following reasons:   Sudden, new, or worsening shortness of breath or chest pain.  Increased swelling of the legs, ankles, or feet.  Weight gain of 3 or more pounds in 3 days or less.  Sudden new shortness of breath.    If you have any questions about your hospital care, your medications, or if you have new or concerning symptoms soon after going  home from the hospital, and you need to contact your hospital cardiologist, your hospital cardiologist can be contacted in the following manner:  [facility] operator at [phone].    Once you are able to see your primary care physician (PCP), your PCP will then be responsible for further medication refills, or appointment referrals.    What Needs to Happen Next After Discharge -- Appointments and Follow Up    Any appointments already scheduled at [facility] clinics will be listed in the Future Appointments section at the top of this After Visit Summary.  Any appointments that have been requested, but have not yet been scheduled, will be listed below that under Post Discharge Referrals.    Sometimes tests performed in the hospital do not yet have results by the time a patient goes home.  The following key tests will need to be followed up at your next appointment: None    Medical Home Information    Your primary care provider or clinic currently on file at [facility] is: Clinic, [facility]    Suicidal Ideation    You have been seen today because of thoughts of hurting yourself.    It is important to follow up with your counselor as well as your family doctor. If you do not have  an appointment in the next 2-3 days, call and make one. It is very important that your counselor and family doctor know about the worsening problems that you are experiencing.    It is important to have a counselor and a family doctor who see you on a regular basis. They can help you with your problem and monitor your progress.    Stay with someone whom you trust tonight.    Although you may not have a specific suicide plan, you should strongly consider removing all weapons from your home.    AVOID THE USE OF ALCOHOL AND OTHER DRUGS: The use of these substances may intensify or increase your suicide thoughts!    YOU SHOULD SEEK MEDICAL ATTENTION IMMEDIATELY, EITHER HERE OR AT THE NEAREST EMERGENCY DEPARTMENT, IF ANY OF THE  FOLLOWING OCCURS:   Worsening or new thoughts of harming yourself (suicidal thoughts).   Thoughts of harming others.   You do not feel safe in your home environment.

## 2012-09-14 NOTE — ED Notes (Signed)
Right Sided EKG Performed and presented to the MD. Extra copy placed in the patients chart. Patient gets aggressive upon every EKG. Patient wants to be left alone to sleep.

## 2012-09-14 NOTE — ED Notes (Signed)
Pt uncooperative and verbally aggressive and threatening. Refusing treatment. Dr Glee Arvin at bedside and aware. Ok to hold treatment at this time per Dr Glee Arvin.

## 2012-09-14 NOTE — ED Notes (Signed)
EKG Performed and handed tot he MD. This is the 3rd EKG this evening.

## 2012-09-14 NOTE — Consults (Signed)
Psychiatry Resident Consult Note    Date of Admission: 09/13/2012  Consulting Attending: McDivit, Marlana Salvage., MD  Reason for Consult: SI evaluation     History of Present Illness:     [name] [name] is a 42 year old male with history of mood disorder, substance abuse, currently on 5150 for SI who is sent to ED from [facility] for CP. Patient is extremely agitated and a very poor, uncooperative historian. He reports that he had chest pains and was brought to the hospital. He reports that he refused stress test because he is "done with it". He repeatedly voices his desire to leave this hospital and return to [facility]. He reports that he is becoming increasingly agitated with further questioning. He reports that he has had SI in the past, but none at the current moment. He is evasive when questioned about HI, eventually denying HI. He reports that he has access to shelter, food, clothing. He is unable to elaborate under further questioning, becoming increasingly agitated, states that "it's all in the chart".    Upon returning with the resident physician, patient is more cooperative and responsive to questioning. He reports that yesterday he felt "depressed" and "wanted to put a bullet in my head" while using cocaine, developed chest pains, and then was brought to the hospital. He acknowledges being under the influence yesterday. He now states that he does not want to return to [facility], but wants to "go home". He hopes to "head to [employer] [employer] for work". He states that he "lives on the streets" and has been for 20 years. Currently, [place] and has been in [facility] for "a couple days". He denies family history of depression and suicide. He also denies any past suicide attempts. He reports that he has not seen a psychiatrist in the past and is not on psychiatric medications. He reports no current SI, though has had SI in the past, only when he is using drugs. He reports that he smoked cocaine yesterday, for the  first time in 1-2 years, and now will try to stay away from cocaine.    Past Psychiatric History:  1) Diagnoses: per [facility], h/o bipolar  2) Suicide attempts: denies   3) Inpatient Hospitalizations: denies  4) Outpatient treatment/psychiatrist: denies   5) Medication Trials: denies    Substance History:  Cocaine, last used yesterday, for the "first time in 1-2 years"  Marijuana regularly    Medical History:   Past Medical History   Diagnosis Date   . Myocardial infarction    . ACS (acute coronary syndrome)    . Heart murmur    . Hypertension        Allergies:   Allergies   Allergen Reactions   . Toradol Hives   . Tramadol Hives       Medications:   Current Facility-Administered Medications   Medication   . acetaminophen (TYLENOL) tablet 650 mg   . acetaminophen (TYLENOL) tablet 650 mg   . aluminum-magnesium-simethicone (MAG-AL PLUS) 200-200-20 MG/5ML suspension 30 mL   . aspirin chewable tablet 81 mg   . atorvastatin (LIPITOR) tablet 80 mg   . enoxaparin (LOVENOX) injection 40 mg   . fentaNYL injection 12.5 mcg   . LORazepam (ATIVAN) injection 0.5 mg   . magnesium hydroxide (MILK OF MAGNESIA) suspension 30 mL   . naloxone (NARCAN) injection 0.1 mg   . nitroGLYcerin (NITROSTAT) SL tablet 0.4 mg   . ondansetron (ZOFRAN) injection 4 mg   .  sodium chloride (PF) 0.9 % flush 3 mL   . sodium chloride (PF) 0.9 % flush 3 mL   . sodium chloride 0.9% infusion   . zolpidem (AMBIEN) tablet 5 mg       Scheduled:  . aspirin  81 mg Daily   . atorvastatin  80 mg QPM   . enoxaparin  40 mg Daily   . sodium chloride (PF)  3 mL Q8H       PRN:  . acetaminophen  650 mg Q4H PRN   . acetaminophen  650 mg Q4H PRN   . aluminum-magnesium-simethicone  30 mL Q6H PRN   . fentaNYL  12.5 mcg Q1H PRN   . LORazepam  0.5 mg Q2H PRN   . magnesium hydroxide  30 mL Nightly PRN   . naloxone  0.1 mg Q2 Min PRN   . nitroGLYcerin  0.4 mg Q5 Min PRN   . ondansetron  4 mg Q6H PRN   . sodium chloride (PF)  3 mL PRN   . sodium chloride   Continuous PRN   . zolpidem   5 mg Nightly PRN       Social History:  Patient reports that he is homeless and has lived on the "streets" for the past 20 years. Originally from [place], but [place] the [place] "couple of days" and hopes to head to [facility] for "work". 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    Family History:  Refuses to provide details. Denies family history of depression or suicide.           MSE:   Appearance: caucasian male, appears stated age, disheveled, unkempt, sleeping, but alert when aroused  Behavior: agitated, uncooperative, appears annoyed  Cognition: intact, alert  Speech: normal rate and rhythm, volume  Mood: "fine"   Affect: irritable  Thought Process:superficially linear, logical    Thought Content: Denies SI/HI.  No evidence of active paranoia or delusions.   Insight/Judgment: fair/fair    Assessment:  1. 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 Probable stimulant intoxication by history  3. A comprehensive suicide risk assessment was performed and the patient was assessed to be at a low acute risk of self-harm. Modifiable risk factors include intoxication and precipitating stressors.  Non-modifiable risk factors include existing psychiatric diagnoses and male gender. The patient also has protective factors of access to health care.    Recommendations:  1.  Will discontinue 5150 as patient currently denies suicidal or homicidal intent and is not gravely disabled.  Patient reports SI occur only when he is using drugs. Pt was counseled to avoid substance abuse and pt was agreeable.  2.  OK to discharge when medically stable.    3.  Thank you for this consult.  Please page [phone] with any questions.      Patient discussed with attending psychiatrist, Dr. Bland Span, who is in agreement with plan.      Note written in collaboration with Morene Crocker MS3.    Merceda Elks MD, PGY-2    Attending Addendum:  Case discussed with  resident physician and I agree with above assessment and recommendations.

## 2012-09-14 NOTE — Plan of Care (Signed)
Problem: Tissue Perfusion - Cardiopulmonary, Altered  Goal: Hemodynamic stability  Outcome: Met  Admitted from [facility] for chest pain, received from ER chest pain free, trop x2 (-) but with elevated cpk, MD aware, SB on tele at 40-50's which is pt's baseline from ER, pedal pulses strong and palpable, no signs of edema, npo for possible cath vs. Stress test this am.    Problem: Falls - Risk of  Goal: Absence of falls  Outcome: Met  Received from ER drowsy but easily arousable, impulsive, with sitter at bedside for SI, fall preventive teachings deferred at this time, patient uncooperative, bed alarm activated, side rails x2 up for safety, instructed sitter to report if patient becomes agitated, aggressive and threatening.    Problem: Thromboemboli, Risk of  Goal: Absence of signs and symptoms of Thromboemboli  Outcome: Met  No signs of thromboemboli, able to ambulate from gurney to bed, on lovenox for prophylaxis.    Problem: Altered safety related to suicide intent/plan and increased risk for suicide  Goal: Patient is free from self harm, denies active suicidal intent or plan  Outcome: Met  Admitted from ER with constant observer due to SI. Came from [facility] and was on hold due to SI, safety precautions observed closely, safe environment provided, unable to ask questions at this time, patient uncooperative and easily get agitated and aggressive to staff, hourly rounding done.

## 2012-09-14 NOTE — Plan of Care (Signed)
Problem: Pain - Acute  Goal: Communication of presence of pain  Outcome: Met  Received from ER chest pain free, attempted to discuss patient regarding pain regimen, pain scale and acceptable level of pain, patient not receptive at this time, informed to call when chest pain recurs and that pain med is available when needed.

## 2012-09-14 NOTE — ED Notes (Signed)
Rpt CE drawn/sent to lab, BP 88/49, MD Mummert notified

## 2012-09-15 LAB — MRSA SURVEILLANCE CULTURE

## 2012-09-26 NOTE — ED Provider Notes (Signed)
History  Chief Complaint   Patient presents with   . Chest Pain     Arrives via medic from [facility]. pt was seen at [facility] yesterday for same and AMA'd then went to [facility] for SI. now on a hold.     HPI  42 year old male with history of MI, CAD, HTN, here on 67 from [facility] for chest pain. Has been seen at OSH multiple times in the last couple of days and was admitted with plan for stress, but left AMA. Went to [facility] today complaining of SI, put on hold then developed chest pain. Endorses exertional chest pain over last few weeks. Pain today started 1 hour prior to presentation, substernal, nonradiating, unclear if exertional today, not pleuritic. No shortness of breath, cough, fevers/chills, orthopnea/pnd, leg swelling, recent travel. No nausea, diaphoresis.     Past Medical History   Diagnosis Date   . Myocardial infarction    . ACS (acute coronary syndrome)    . Heart murmur    . Hypertension        Past Surgical History   Procedure Laterality Date   . Knee surgery     . Back surgery     . Surgery to the skull  from fracture       FH: +cad    SH: +tob, +cocaine    Review of Systems  All other systems reviewed and negative except as above in HPI    Physical Exam  BP 109/66  Pulse 96  Temp(Src) 98.7 F (37.1 C)  Resp 17  Ht 6\' 1"  (1.854 m)  Wt 83.2 kg (183 lb 6.8 oz)  BMI 24.2 kg/m2  SpO2 98%    Physical Exam  Constitutional: Oriented to person, place, and time. Appears well-developed and well-nourished. No distress.   HENT:   Head: Normocephalic and atraumatic.   Mouth/Throat: Oropharynx is clear and moist.   Eyes: EOM are normal. Pupils are equal, round, and reactive to light.   Neck: Neck supple.  no JVD  Cardiovascular: Normal rate, regular rhythm, normal heart sounds and intact distal pulses.  no murmurs/rubs/gallops   Pulmonary/Chest: Effort normal and breath sounds normal.  no wheezes/rales/rhonchi   Abdominal: Soft. Bowel sounds are normal. No distension. There is no tenderness.   Musculoskeletal: Normal range of  motion. no edema  Neurological: Alert and oriented to person, place, and time. 5/5 strength & intact light touch sensation in all extremities     Results for orders placed during the hospital encounter of 09/13/12   CBC WITH ADIFF, BLOOD       Result Value Range    WBC 7.2  4.0 - 10.0 1000/mm3    RBC 4.86  4.60 - 6.10 mill/mm3    Hgb 13.5 (*) 13.7 - 17.5 gm/dL    Hct 16.1  09.6 - 04.5 %    MCV 82.3  79.0 - 95.0 um3    MCH 27.8  26.0 - 32.0 pgm    MCHC 33.8  32.0 - 36.0 %    RDW 15.2 (*) 12.0 - 14.0 %    MPV 9.1 (*) 9.4 - 12.4 fL    Plt Count 316  140 - 370 1000/mm3    Segs 66  34 - 71 %    Lymphocytes 22  19 - 53 %    Monocytes 11  5 - 12 %    Eosinophils 1  1 - 7 %    Absolute Neutrophil Count 4.7  1.6 - 7.0 1000/mm3  Abs Lymphs 1.6  0.8 - 3.1 1000/mm3    Abs Monos 0.8  0.2 - 0.8 1000/mm3    Abs Eosinophils 0.1  0.0 - 0.5 1000/mm3    Diff Type Automated     CPK-CREATINE PHOSPHOKINASE, BLOOD       Result Value Range    CPK 225 (*) 0 - 175 U/L   CKMB+INDEX (NO CPK) PANEL, BLOOD       Result Value Range    CK-MB 4.1  0.0 - 4.8 ng/mL    CK-MB Index 1.8     TROPONIN T, BLOOD       Result Value Range    Troponin T <0.01  <0.01 ng/mL   COMPREHENSIVE METABOLIC PANEL, BLOOD       Result Value Range    Glucose 105  70 - 115 mg/dL    BUN 16  6 - 20 mg/dL    Creatinine 1.61  0.96 - 1.17 mg/dL    GFR >04      Sodium 138  136 - 145 mmol/L    Potassium 3.8  3.5 - 5.1 mmol/L    Chloride 101  98 - 107 mmol/L    Bicarbonate 26  22 - 29 mmol/L    Calcium 9.6  8.6 - 10.0 mg/dL    Total Protein 6.7  6.0 - 8.0 g/dL    Albumin 4.3  3.5 - 5.2 g/dL    Bilirubin, Tot 0.2  <1.2 mg/dL    AST (SGOT) 13  0 - 40 U/L    ALT (SGPT) 13  0 - 41 U/L    Alkaline Phos 88  40 - 129 U/L   APTT, BLOOD       Result Value Range    PTT 31.4  25.0 - 34.0 sec   PROTHROMBIN TIME, BLOOD       Result Value Range    PT,Patient 11.7  9.7 - 12.5 sec    INR 1.1     CKMB+INDEX (NO CPK) PANEL, BLOOD       Result Value Range    CK-MB 4.0  0.0 - 4.8 ng/mL    CK-MB  Index 1.9     TROPONIN T, BLOOD       Result Value Range    Troponin T <0.01  <0.01 ng/mL   CPK-CREATINE PHOSPHOKINASE, BLOOD       Result Value Range    CPK 214 (*) 0 - 175 U/L   CPK-CREATINE PHOSPHOKINASE, BLOOD       Result Value Range    CPK 209 (*) 0 - 175 U/L      ED Course/Medical Decision Making Narrative  42 year old male with history of prior MI, on 10 for DTS from [facility] here with chest pain, unclear if exertional, somewhat atypical but does have risk factors for ACS. Doubt PE, ptx, pna, dissection, pericarditis.  EKG no acute ischemic changes. CXR no acute cardiopulmonary disease. Admitted to cards tele for risk stratification    Discussed with Dr. Noralee Stain          Critical Care Time            Additional Notes      Home Medication List  Prior to Admission Medications   Outpatient Medications Last Dose Informant Patient Reported? Taking?   lisinopril (PRINIVIL, ZESTRIL) 20 MG tablet Unknown  Yes No   Sig: Take 20 mg by mouth daily.      Facility-Administered Medications: None       Camdyn Beske  [place], MD  Resident  09/26/12 2022

## 2012-11-04 ENCOUNTER — Inpatient Hospital Stay: Admit: 2012-11-04 | Disposition: E | Source: Home / Self Care | Admitting: Emergency Medicine

## 2012-11-04 LAB — BASIC METABOLIC PANEL
BUN: 14 mg/dL (ref 6–20)
BUN: 14 mg/dL (ref 6–20)
CO2: 22 mmol/L (ref 22–29)
CO2: 23 mmol/L (ref 22–29)
Calcium: 8.9 mg/dL (ref 8.6–10.2)
Calcium: 8.9 mg/dL (ref 8.6–10.2)
Chloride: 100 mmol/L (ref 98–107)
Chloride: 99 mmol/L (ref 98–107)
Creatinine: 0.7 mg/dL (ref 0.7–1.2)
Creatinine: 0.7 mg/dL (ref 0.7–1.2)
GFR African American: >60
GFR African American: >60
GFR Non-African American: >60 mL/min/{1.73_m2} (ref >=60)
GFR Non-African American: >60 mL/min/{1.73_m2} (ref >=60)
Glucose: 72 mg/dL — ABNORMAL LOW (ref 74–109)
Glucose: 72 mg/dL — ABNORMAL LOW (ref 74–109)
Potassium: 4.8 mmol/L (ref 3.5–5.0)
Potassium: 4.8 mmol/L (ref 3.5–5.0)
Sodium: 134 mmol/L (ref 132–146)
Sodium: 135 mmol/L (ref 132–146)

## 2012-11-04 LAB — HEPATIC FUNCTION PANEL
ALT: 22 U/L (ref 0–40)
AST: 29 U/L (ref 0–39)
Albumin: 4.2 g/dL (ref 3.5–5.2)
Alkaline Phosphatase: 87 U/L (ref 40–129)
Bilirubin, Direct: <0.2 mg/dL (ref 0.0–0.3)
Bilirubin, Indirect: 0 mg/dL (ref 0.0–1.0)
Total Bilirubin: 0.2 mg/dL (ref 0.0–1.2)
Total Protein: 6.7 g/dL (ref 6.4–8.3)

## 2012-11-04 LAB — CBC
Hematocrit: 37.6 % (ref 37.0–54.0)
Hemoglobin: 12.5 g/dL (ref 12.5–16.5)
MCH: 27.6 pg (ref 26.0–35.0)
MCHC: 33.2 % (ref 32.0–34.5)
MCV: 83.3 fL (ref 80.0–99.9)
MPV: 7.9 fL (ref 7.0–12.0)
Platelets: 278 E9/L (ref 130–450)
RBC: 4.51 E12/L (ref 3.80–5.80)
RDW: 16.2 fL — ABNORMAL HIGH (ref 11.5–15.0)
WBC: 11.5 E9/L (ref 4.5–11.5)

## 2012-11-04 LAB — URINE DRUG SCREEN
Amphetamine Screen, Urine: NOT DETECTED (ref <1000)
Barbiturate Screen, Ur: NOT DETECTED (ref <200)
Benzodiazepine Screen, Urine: NOT DETECTED (ref <200)
Cannabinoid Scrn, Ur: NOT DETECTED
Cocaine Metabolite Screen, Urine: NOT DETECTED (ref <300)
Methadone Screen, Urine: NOT DETECTED (ref <300)
Opiate Scrn, Ur: POSITIVE — AB
PCP Screen, Urine: NOT DETECTED (ref <25)
Propoxyphene Scrn, Ur: NOT DETECTED

## 2012-11-04 LAB — TROPONIN: Troponin: <0.01 ng/mL (ref 0.00–0.03)

## 2012-11-04 LAB — PROTIME-INR
INR: 1.1
Protime: 11.1 s (ref 9.3–12.0)

## 2012-11-04 LAB — D-DIMER, QUANTITATIVE: D-Dimer, Quant: 382 ng/mL DDU

## 2012-11-04 LAB — APTT: aPTT: 28.3 s (ref 24.5–35.1)

## 2012-11-04 MED ORDER — MORPHINE SULFATE (PF) 2 MG/ML IV SOLN
2 MG/ML | Freq: Once | INTRAVENOUS | Status: AC
Start: 2012-11-04 — End: 2012-11-04
  Administered 2012-11-05: via INTRAVENOUS

## 2012-11-04 MED ORDER — IOVERSOL 68 % IV SOLN
68 % | Freq: Once | INTRAVENOUS | Status: AC | PRN
Start: 2012-11-04 — End: 2012-11-04
  Administered 2012-11-04: 22:00:00 via INTRAVENOUS

## 2012-11-04 MED ORDER — IOPAMIDOL 76 % IV SOLN
76 % | Freq: Once | INTRAVENOUS | Status: DC | PRN
Start: 2012-11-04 — End: 2012-11-05

## 2012-11-04 MED ORDER — ONDANSETRON HCL 4 MG/2ML IJ SOLN
4 MG/2ML | Freq: Once | INTRAMUSCULAR | Status: AC
Start: 2012-11-04 — End: 2012-11-04
  Administered 2012-11-05: via INTRAVENOUS

## 2012-11-04 MED ORDER — SODIUM CHLORIDE 0.9 % IV BOLUS
0.9 % | Freq: Once | INTRAVENOUS | Status: AC
Start: 2012-11-04 — End: 2012-11-04
  Administered 2012-11-04: 21:00:00 via INTRAVENOUS

## 2012-11-04 NOTE — ED Notes (Signed)
Patient re instructed on the need for a urine sample    Raj Janus, RN  November 05, 2012 1908

## 2012-11-04 NOTE — ED Notes (Addendum)
Patient was turned over to me by [facility], patient reports that he has had chest pain for last 2 hours radiating to his left shoulder he also complained of headache patient was awaiting CT head and chest report. Plan will be to admit patient to hospital. Patient continues to have chest pain and repeat EKG was done    CT chest and head preliminary report reviewed and are negative    EKG:  This EKG is signed and interpreted by me.    Rate: 71  Rhythm: Sinus  Interpretation: no acute changes  Comparison: stable compared to previous    Call placed to Dr Julianne Handler for internal medicine for admission    Dwain Sarna, MD  November 15, 2012 1859    Dwain Sarna, MD  11/15/2012 [phone]

## 2012-11-04 NOTE — ED Notes (Addendum)
Called to xray due to pt c/o severe left sided chest pain. Pt texting while telling me his chest hurt. Portable monitor applied and Dr. Myrtie Soman informed of pt's complaint.  CTA of chest ordered. O2 remains on @ 3L via cannula.  Pt denies pain with deep breath or with movement.  This RN will remain with pt until CT's are completed.     Ranell Patrick, RN  11-24-2012 1733    Ranell Patrick, RN  11-24-2012 [phone]

## 2012-11-04 NOTE — ED Provider Notes (Signed)
HPI:  Nov 05, 2012, Time: 4:15 PM         Miguel Carr is a 42 y.o. male presenting to the ED for chest pain, beginning 30 minutes ago.  The complaint has been constant, severe in severity, and worsened by nothing.  Patient describes the pain as crushing pain. States he collapsed but did not lose consciousness. At the same time he also had a headache which had started briefly earlier and is described as a severe headache. Also complains of numb tingling sensation in his left arm and left leg as well as the left side of his face. Patient has a history of a similar problem approximately year ago at another location and has been on Nitro-Bid since. Patient denies actually having an MI. Patient had refused a stress test at that time. Patient admits cigarette use. Admits medical marijuana use. Denies cocaine use. Patient is traveling through the area by car. Has no history of DVTs or PE. Has a strong family history of coronary artery disease and CVAs with his father having a heart attack at age 41.    ROS:   Unless otherwise stated in this report or unable to obtain because of the patient's clinical or mental status as evidenced by the medical record, this patients's positive and negative responses for Review of Systems, constitutional, psych, eyes, ENT, cardiovascular, respiratory, gastrointestinal, neurological, genitourinary, musculoskeletal, integument systems and systems related to the presenting problem are either stated in the preceding or were not pertinent or were negative for the symptoms and/or complaints related to the medical problem.      --------------------------------------------- PAST HISTORY ---------------------------------------------  Past Medical History:  has a past medical history of Hypertension and Myocardial infarct (HCC).    Past Surgical History:  has no past surgical history on file.    Social History:  reports that he has been smoking Cigarettes.  He has been smoking about 0.50 packs per  day. He does not have any smokeless tobacco history on file. He reports that he does not drink alcohol or use illicit drugs.    Family History: family history is not on file.     The patient???s home medications have been reviewed.    Allergies: Toradol and Tramadol        ---------------------------------------------------PHYSICAL EXAM--------------------------------------    Constitutional/General: Alert and oriented x3  Head: Normocephalic and atraumatic  Eyes: PERRL, EOMI  Mouth: Oropharynx clear, handling secretions, no trismus  Neck: Supple, full ROM, non tender to palpation in the midline, no stridor, no crepitus, no meningeal signs  Pulmonary: Lungs clear to auscultation bilaterally, no wheezes, rales, or rhonchi. Not in respiratory distress  Cardiovascular: Rapid rate. Regular rhythm. No murmurs, gallops, or rubs. 2+ distal pulses  Chest: no chest wall tenderness  Abdomen: Soft.  Non tender. Non distended.  +BS.  No rebound, guarding, or rigidity. No pulsatile masses appreciated.  Musculoskeletal: Moves all extremities x 4. Warm and well perfused, no clubbing, cyanosis, or edema. Capillary refill <3 seconds  Skin: warm and dry. No rashes.   Neurologic: GCS 15, CN 2-12 grossly intact, no focal deficits, symmetric strength 5/5 in the upper and lower extremities bilaterally. Patient complains of a tingling sensation in the left arm and left leg but no numbness appreciated even to light touch.  Psych: Anxious Affect    -------------------------------------------------- RESULTS -------------------------------------------------  I have personally reviewed all laboratory and imaging results for this patient. Results are listed below.     LABS:  No results found for this visit  on 2012-11-08.    RADIOLOGY:  Interpreted by Radiologist.  XR CHEST PORTABLE    (Results Pending)   CT HEAD WO CONTRAST    (Results Pending)         EKG Interpretation  Interpreted by emergency department physician    Rhythm: sinus  tachycardia  Rate: tachycardia  Axis: normal  Conduction: normal  ST Segments: no acute change  T Waves: no acute change    Clinical Impression: Sinus tachycardia   Comparison to prior EKG: None      ------------------------- NURSING NOTES AND VITALS REVIEWED ---------------------------   The nursing notes within the ED encounter and vital signs as below have been reviewed by myself.  BP 106/59   Pulse 103   Temp(Src) 98 ??F (36.7 ??C) (Oral)   Resp 16   Ht 6\' 1"  (1.854 m)   Wt 190 lb (86.183 kg)   BMI 25.07 kg/m2   SpO2 98%  Oxygen Saturation Interpretation: Normal    The patient???s available past medical records and past encounters were reviewed.        ------------------------------ ED COURSE/MEDICAL DECISION MAKING----------------------  Medications   0.9 % sodium chloride bolus (not administered)             Medical Decision Making:    Headache, etiology to be determined. Chest pain, etiology to be determined    This patient's ED course included: a personal history and physicial eaxmination, re-evaluation prior to disposition, cardiac monitoring and continuous pulse oximetry  Addendum: Patient developed chest pain while in CT scan. We will obtain CT a of the chest as well.          --------------------------------- IMPRESSION AND DISPOSITION ---------------------------------    IMPRESSION  1. Headache(784.0)    2. Chest pain        DISPOSITION  Disposition: endorsed to Dr. Coralyn King  Patient condition is fair        NOTE: This report was transcribed using voice recognition software. Every effort was made to ensure accuracy; however, inadvertent computerized transcription errors may be present              Dimas Chyle, DO  Nov 08, 2012 1820

## 2012-11-04 NOTE — Progress Notes (Signed)
Paged resident on call for patient.

## 2012-11-04 NOTE — ED Notes (Signed)
Patient given a dinner tray    Raj Janus, RN  2012-11-28 2049

## 2012-11-04 NOTE — ED Notes (Signed)
Patient went to cat scan    Raj Janus, RN  2012-12-02 1700

## 2012-11-04 NOTE — ED Notes (Signed)
Patient instructed on the need for a urine sample    Raj Janus, RN  Nov 11, 2012 1650

## 2012-11-04 NOTE — ED Notes (Signed)
Oxygen nasal cannula placed at 4l non rebreather removed patient states he has a deviated septum and he only responds to [employer], RN  2012/11/16 1653

## 2012-11-04 NOTE — ED Notes (Signed)
Lab called to find out results of BUN/Creat.  BMP was not yet run.  Asked if it could be run stat as we were waiting in CT for BUN/CREAT.      Ranell Patrick, RN  12/01/2012 1739

## 2012-11-05 LAB — CK: Total CK: 183 U/L (ref 20–200)

## 2012-11-05 LAB — TROPONIN: Troponin: <0.01 ng/mL (ref 0.00–0.03)

## 2012-11-05 LAB — CK-MB: CK-MB: 3.4 ng/mL (ref 0.0–6.7)

## 2012-11-05 MED ORDER — ONDANSETRON HCL 4 MG/2ML IJ SOLN
4 MG/2ML | Freq: Four times a day (QID) | INTRAMUSCULAR | Status: DC | PRN
Start: 2012-11-05 — End: 2012-11-05

## 2012-11-05 MED ORDER — MAGNESIUM HYDROXIDE 400 MG/5ML PO SUSP
400 MG/5ML | Freq: Every day | ORAL | Status: DC | PRN
Start: 2012-11-05 — End: 2012-11-05

## 2012-11-05 MED ORDER — NITROGLYCERIN 2 % TD OINT
2 % | Freq: Four times a day (QID) | TRANSDERMAL | Status: DC
Start: 2012-11-05 — End: 2012-11-05

## 2012-11-05 MED ORDER — ACETAMINOPHEN 325 MG PO TABS
325 MG | ORAL | Status: DC | PRN
Start: 2012-11-05 — End: 2012-11-05

## 2012-11-05 MED ORDER — PANTOPRAZOLE SODIUM 40 MG PO TBEC
40 MG | Freq: Every day | ORAL | Status: DC
Start: 2012-11-05 — End: 2012-11-05

## 2012-11-05 MED ORDER — NORMAL SALINE FLUSH 0.9 % IV SOLN
0.9 % | Freq: Two times a day (BID) | INTRAVENOUS | Status: DC
Start: 2012-11-05 — End: 2012-11-05

## 2012-11-05 MED ORDER — NORMAL SALINE FLUSH 0.9 % IV SOLN
0.9 % | INTRAVENOUS | Status: DC | PRN
Start: 2012-11-05 — End: 2012-11-05

## 2012-11-05 MED ORDER — LISINOPRIL 20 MG PO TABS
20 MG | Freq: Every day | ORAL | Status: DC
Start: 2012-11-05 — End: 2012-11-05

## 2012-11-05 MED ORDER — ASPIRIN 81 MG PO CHEW
81 MG | Freq: Every day | ORAL | Status: DC
Start: 2012-11-05 — End: 2012-11-05

## 2012-11-05 MED ORDER — ENOXAPARIN SODIUM 40 MG/0.4ML SC SOLN
40 MG/0.4ML | Freq: Every day | SUBCUTANEOUS | Status: DC
Start: 2012-11-05 — End: 2012-11-05

## 2012-11-05 MED ORDER — NORMAL SALINE FLUSH 0.9 % IV SOLN
0.9 % | Freq: Two times a day (BID) | INTRAVENOUS | Status: DC
Start: 2012-11-05 — End: 2012-11-05
  Administered 2012-11-05: 02:00:00 via INTRAVENOUS

## 2012-11-05 MED FILL — ASPIRIN LOW STRENGTH 81 MG PO CHEW: 81 MG | ORAL | Qty: 1

## 2012-11-05 MED FILL — MORPHINE SULFATE (PF) 2 MG/ML IV SOLN: 2 mg/mL | INTRAVENOUS | Qty: 1

## 2012-11-05 MED FILL — LISINOPRIL 20 MG PO TABS: 20 MG | ORAL | Qty: 1

## 2012-11-05 MED FILL — ONDANSETRON HCL 4 MG/2ML IJ SOLN: 4 MG/2ML | INTRAMUSCULAR | Qty: 2

## 2012-11-05 MED FILL — PANTOPRAZOLE SODIUM 40 MG PO TBEC: 40 MG | ORAL | Qty: 1

## 2012-11-05 NOTE — Progress Notes (Signed)
House Medicine Admit Note  House Team 1    Patient:  Miguel Carr  MRN: [id number]  Date of Service: 11/05/2012    CHIEF COMPLAINT:  Chest pain    History Obtained From:  patient  PCP: No primary provider on file.    History of the present illness  Miguel Carr is a 41y/o male with PMH HTN, depression, tobacco abuse, marijuana use who presents to the ER with chest pain and left sided weakness.  Patient says that he was relaxing when he felt a left sided squeezing chest pain, 8/10 in severity, while he was having dinner, associated with diaphoresis, SOB and dizziness, not relieved by nitro. He also felt left sided weakness and felt like he lost a grip of his cup. He then decided to call the EMS. He relates having a similar episode in [place] a year ago and was advised to undergo a stress test but due to his claustrophobia, he decided to go AMA.  He has a strong family hx of heart disease- father had 1st MI at 67 y/o, paternal grandfather had MI in his 62s, both parents have DM.  He smokes 1/2 ppd x 25 yrs and admits to using marijuana occasionally and took 1x vicodin from his friend 1 day prior.    HPI, review of systems, physical examination as per H&P that was done in coordination with PGY-1 resident whom I supervised while obtaining information from patient as well as performing review of systems and physical examination. Labs and Imaging reviewed. I have discussed our overall assessment and plan with PGY-1 resident in detail and agree with assessment and plan in H&P. Admission orders and further work-up orders placed in Epic.    Assessment and Plan  Atypical chest pain  - The TIMI Risk Score for Unstable Angina/ Non-ST elevation MI:  Age ? 65 years?  no (0)   ? 3 Risk Factors for CAD?  yes (1)   Known CAD (stenosis ? 50%)?  no (0)   ASA Use in Past 7d?  yes (1)   Severe angina (? 2 episodes w/in 24 hrs)?   no (0)   ST changes ? 0.56mm?  no (0)   Positive Cardiac Marker?   no (0)   Total: 2   - r/o ACS vs  musculoskeletal  - cycle cardiac enzymes  - repeat EKG  - since patient says he is claustrophobic, will just get EKG part of stress test  - NTG prn for pain    Left sided weakness  - TIA?  - patient claims to still have left sided weakness but hoover's sign indicative of malingering  - CT head negative  - for MRI but patient says he cannot tolerate due to claustrophobia  - continue ASA    Possible drug seeking/malingering  - patient's symptoms and physical exam not correlating  - keeps on asking for narcotics    HTN, stable  - home meds: lisinopril, ASA- continue    Substance abuse (tobacco, marijuana)  - smokes cigarettes and marijuana  - tobacco cessation prior to discharge  - counseling    GI/DVT prophylaxis  - protonix, lovenox    Disposition: admit to telemetry under house team 1    Case discussed with Dr. Julianne Handler (attending on call) and Dr. Joette Catching, PGY 1    Cathlyn Parsons, MD  Internal Medicine, PGY 2  Attending Physician: Dr. Lillia Mountain   11/05/2012  3:20 AM

## 2012-11-05 NOTE — H&P (Signed)
[facility]  Department of Internal Medicine  Internal Medicine Residency Program  History and Physical    Patient:  Miguel Carr 42 y.o. male MRN: [id number]     Date of Service: 11/05/2012      Chief complaint: chest pain    History of Present Illness   The patient is a 42 y.o. male with PMH of claustrophobia, HTN and ?MI, presented for chest pain. Patient said that he started feeling squeezing left sided chest pain and sweating earlier while he was sitting after eating dinner. He said pain is like someone sitting on his chest. Pain was associated with left sided weakness and numbness in both arm and leg. Pain is like 8/10. He also complained of SOB and dizziness. Patient said he had similar pain a year ago when he was in [place]. He said he was told that he had a heart attache but he left before doing the stress test because he was scared.  Patient currently is complaining of pain that is not helped with Nitro or aspirin. He is demanding morphine.    Patient smoked half a pack/ day for 25 years. He denies drinking alcohol. He said that he is positive for opioid because he took Vicodin from a friend two days ago for back pain.    ED Course:   Patient was given nitro and aspirin. EKG was negative. Patient was given morphine for pain.       Past Medical History:      Diagnosis Date   ??? Hypertension    ??? Myocardial infarct [facility])        Past Surgical History:    History reviewed. No pertinent past surgical history.    Medications Prior to Admission:    Prior to Admission medications    Medication Sig Start Date End Date Taking? Authorizing Provider   aspirin 81 MG chewable tablet Take 81 mg by mouth daily.   Yes Historical Provider, MD   lisinopril (PRINIVIL;ZESTRIL) 10 MG tablet Take 20 mg by mouth daily.   Yes Historical Provider, MD       Allergies:  Toradol and Tramadol    Social History:   TOBACCO:   reports that he has been smoking Cigarettes.  He has been smoking about 0.50 packs per day. He  does not have any smokeless tobacco history on file.  ETOH:   reports that he does not drink alcohol.  OCCUPATION:      Family History:   History reviewed. No pertinent family history.    REVIEW OF SYSTEMS:  ?? See Above    Physical Exam   ?? Vitals: BP 117/69   Pulse 82   Temp(Src) 97.9 ??F (36.6 ??C) (Oral)   Resp 20   Ht 6\' 1"  (1.854 m)   Wt 189 lb (85.73 kg)   BMI 24.94 kg/m2   SpO2 98%  ?? General Appearance: Alert, cooperative, no acute distress.  ?? HEENT: Normocephalic, atraumatic. PERLA, conjunctiva/corneas clear, EOM's intact, no pallor  or icterus.   ?? Neck: Supple, symmetrical, trachea midline, no cervical LAD. No thyroid enlargement/tenderness/nodules. No carotid bruit or JVD  ?? Chest wall: No tenderness or deformity, full & symmetric excursion  ?? Lung: Clear to auscultation bilaterally,  respirations unlabored. No rales/wheezing/rubs  ?? Heart: Regular rate and rhythm, S1 and S2 normal, no murmur, rub or   gallop. no bruits (carotid/femoral/abd), pedal pulses 2+,  no edema  ?? Abdomen: Soft, non-tender, bowel sounds active all four quadrants, no masses,  no organomegaly  ?? Genital and rectal exam: deferred  ?? Extremities:  Extremities normal, atraumatic, no cyanosis or edema. Pulses equal bilaterally  ?? Skin: Skin color, texture, turgor normal, no rashes or lesions  ?? Musculokeletal: No joint swelling, no muscle tenderness. ROM normal in all joints of extremities.   ?? Lymph nodes: no lymph node enlargement apprciated  ?? Neurologic:   Alert&Oriented.    CNII-XII intact.   Normal on right side. Questionable weakness on left side (arm and leg)  Babinski sign is absent (flexor)  coordination with finger to nose, heel to shin and repetitive movement is intact. Romberg negative  sensation intact     Labs and Imaging Studies   Basic Labs  CBC:   Lab Results   Component Value Date    WBC 11.5 11/09/2012    RBC 4.51 11/09/12    HGB 12.5 11/09/12    HCT 37.6 11/09/2012    PLT 278 2012/11/09    MCV 83.3 11-09-2012     BMP:     Lab Results   Component Value Date    NA 134 2012-11-09    K 4.8 2012/11/09    CL 99 09-Nov-2012    CO2 23 11/09/12    BUN 14 2012/11/09    CREATININE 0.7 2012/11/09    GLUCOSE 72 Nov 09, 2012    CALCIUM 8.9 11-09-2012       Imaging Studies:  Radiology:  CT Head WO Contrast  No acute intracranial findings.    CTA Chest W WO Contrast  1. No pulmonary embolus.   2. No aneurysm formation or dissection of the thoracic aorta.   3. No acute pulmonary process.       EKG: Rhythm Strip: normal sinus rhythm, unchanged from previous tracings.    Resident's Assessment and PLan     1. Chest Pain    Left side at rest. Patient asking for morphine. Possible drug seeking.  - Negative initial cardiac enzyme   - Repeat cardiac enzymes  - Stress test in the morning   - Nitro for pain, no morphine.     2. Weakness of left side     Complaining of left arm and left leg weakness. Most likely malingering. Head  CT and CTA negative.      3. H/o HTN    117/69  - Will continue home meds     4. GI/DVT prophylaxis    - Protonix   - Lovenox     Plan was discussed with attending Dr. Julianne Handler and senior resident Dr. Shawnie Dapper, PGY2  Electronically signed by Florence Canner, MD on 11/05/2012 at 4:06 AM

## 2012-11-05 NOTE — Significant Event (Signed)
Patient wants to sign AMA. He said that his pain is 0/10 and he does not need to do the stress test. He said that he still have the weakness but he was standing and moving around. I explained to the patient the risk of not doing the stress test and leaving against medical advise. Patient said that he is aware of the risk and he accept it. He was also told that if his pain became worse or if he developed any other symptoms, to come back to the hospital.

## 2012-11-05 NOTE — Progress Notes (Signed)
Notified resident on call that patient wants to speak with him.

## 2012-11-05 NOTE — Progress Notes (Signed)
House med notified of pt leaving AMA

## 2012-11-11 LAB — TLC CONFIRMATION

## 2012-11-13 LAB — EKG 12-LEAD: ECG Report: NORMAL

## 2012-11-14 NOTE — Progress Notes (Signed)
Progress note of 11/12/2012    [facility]  Internal Medicine Residency / [facility] Medicine Service    Attending Physician Statement  I have discussed the case, including pertinent history and exam findings with the resident and the team.  I have seen and examined the patient and the key elements of the encounter have been performed by me.  I agree with the assessment, plan and orders as documented by the resident.      Patient admitted after suggestive pressure like chest pain central chest lasting 2 hours radiating to left arm   PH; MI and HTN  Initial EKG's and enzymes negative  H&L and VS stable  H&L clear  Plan; Stress test soon    Remainder of medical problems as per resident note.      Arloa Koh  Internal Medicine Residency Faculty

## 2012-11-15 DEATH — deceased

## 2012-12-14 ENCOUNTER — Observation Stay
Admission: EM | Admit: 2012-12-14 | Discharge: 2012-12-15 | Discharge status: Against Medical Advice | Payer: Self-pay | Attending: Internal Medicine | Admitting: Internal Medicine

## 2012-12-14 ENCOUNTER — Emergency Department: Payer: Self-pay

## 2012-12-14 ENCOUNTER — Observation Stay: Payer: Self-pay | Admitting: Internal Medicine

## 2012-12-14 DIAGNOSIS — D649 Anemia, unspecified: Secondary | ICD-10-CM | POA: Insufficient documentation

## 2012-12-14 DIAGNOSIS — R079 Chest pain, unspecified: Secondary | ICD-10-CM | POA: Diagnosis present

## 2012-12-14 DIAGNOSIS — E785 Hyperlipidemia, unspecified: Secondary | ICD-10-CM | POA: Insufficient documentation

## 2012-12-14 DIAGNOSIS — R0789 Other chest pain: Principal | ICD-10-CM | POA: Insufficient documentation

## 2012-12-14 DIAGNOSIS — I252 Old myocardial infarction: Secondary | ICD-10-CM | POA: Insufficient documentation

## 2012-12-14 DIAGNOSIS — Z87891 Personal history of nicotine dependence: Secondary | ICD-10-CM | POA: Insufficient documentation

## 2012-12-14 DIAGNOSIS — R209 Unspecified disturbances of skin sensation: Secondary | ICD-10-CM | POA: Insufficient documentation

## 2012-12-14 DIAGNOSIS — I10 Essential (primary) hypertension: Secondary | ICD-10-CM | POA: Insufficient documentation

## 2012-12-14 DIAGNOSIS — Z8249 Family history of ischemic heart disease and other diseases of the circulatory system: Secondary | ICD-10-CM | POA: Insufficient documentation

## 2012-12-14 HISTORY — DX: Atherosclerotic heart disease of native coronary artery without angina pectoris: I25.10

## 2012-12-14 HISTORY — DX: Pure hypercholesterolemia, unspecified: E78.00

## 2012-12-14 HISTORY — DX: Essential (primary) hypertension: I10

## 2012-12-14 LAB — TROPONIN I
Troponin I: 0.01 ng/mL (ref 0.00–0.09)
Troponin I: 0.01 ng/mL (ref 0.00–0.09)

## 2012-12-14 LAB — CBC AND DIFFERENTIAL
Basophils Absolute Automated: 0.02 (ref 0.00–0.20)
Basophils Automated: 0 %
Eosinophils Absolute Automated: 0.2 (ref 0.00–0.70)
Eosinophils Automated: 3 %
Hematocrit: 32.8 % — ABNORMAL LOW (ref 42.0–52.0)
Hgb: 10.8 g/dL — ABNORMAL LOW (ref 13.0–17.0)
Immature Granulocytes Absolute: 0.01
Immature Granulocytes: 0 %
Lymphocytes Absolute Automated: 1.38 (ref 0.50–4.40)
Lymphocytes Automated: 24 %
MCH: 26.7 pg — ABNORMAL LOW (ref 28.0–32.0)
MCHC: 32.9 g/dL (ref 32.0–36.0)
MCV: 81 fL (ref 80.0–100.0)
MPV: 9.3 fL — ABNORMAL LOW (ref 9.4–12.3)
Monocytes Absolute Automated: 0.38 (ref 0.00–1.20)
Monocytes: 6 %
Neutrophils Absolute: 3.88 (ref 1.80–8.10)
Neutrophils: 66 %
Nucleated RBC: 0 (ref 0–1)
Platelets: 272 (ref 140–400)
RBC: 4.05 — ABNORMAL LOW (ref 4.70–6.00)
RDW: 15 % (ref 12–15)
WBC: 5.86 (ref 3.50–10.80)

## 2012-12-14 LAB — BASIC METABOLIC PANEL
Anion Gap: 11 (ref 5.0–15.0)
BUN: 14 mg/dL (ref 9–21)
CO2: 22 (ref 22–29)
Calcium: 9.1 mg/dL (ref 8.5–10.5)
Chloride: 108 — ABNORMAL HIGH (ref 98–107)
Creatinine: 0.9 mg/dL (ref 0.7–1.3)
Glucose: 102 mg/dL — ABNORMAL HIGH (ref 70–100)
Potassium: 3.8 (ref 3.5–5.1)
Sodium: 141 (ref 136–145)

## 2012-12-14 LAB — CKMB
Creatinine Kinase MB (CKMB): 2.5 ng/mL (ref 0.0–4.9)
Creatinine Kinase MB (CKMB): 1.6 ng/mL (ref 0.0–4.9)

## 2012-12-14 LAB — GFR: EGFR: >60

## 2012-12-14 LAB — CK
Creatine Kinase (CK): 372 U/L — ABNORMAL HIGH (ref 30–200)
Creatine Kinase (CK): 280 U/L — ABNORMAL HIGH (ref 30–200)

## 2012-12-14 MED ORDER — ASPIRIN 81 MG PO CHEW
81.0000 mg | CHEWABLE_TABLET | Freq: Every day | ORAL | Status: DC
Start: 2012-12-14 — End: 2012-12-14

## 2012-12-14 MED ORDER — SODIUM CHLORIDE 0.9 % IJ SOLN
3.0000 mL | Freq: Three times a day (TID) | INTRAMUSCULAR | Status: DC
Start: 2012-12-14 — End: 2012-12-15

## 2012-12-14 MED ORDER — HYDROMORPHONE HCL PF 1 MG/ML IJ SOLN
1.0000 mg | Freq: Once | INTRAMUSCULAR | Status: AC
Start: 2012-12-14 — End: 2012-12-14
  Administered 2012-12-14: 1 mg via INTRAVENOUS
  Filled 2012-12-14: qty 1

## 2012-12-14 MED ORDER — MORPHINE SULFATE 2 MG/ML IJ/IV SOLN (WRAP)
2.0000 mg | Status: DC | PRN
Start: 2012-12-14 — End: 2012-12-14

## 2012-12-14 MED ORDER — ASPIRIN 81 MG PO TBEC
81.0000 mg | DELAYED_RELEASE_TABLET | Freq: Every day | ORAL | Status: DC
Start: 2012-12-15 — End: 2012-12-15

## 2012-12-14 MED ORDER — ONDANSETRON HCL 4 MG/2ML IJ SOLN
4.0000 mg | Freq: Once | INTRAMUSCULAR | Status: AC
Start: 2012-12-14 — End: 2012-12-14
  Administered 2012-12-14: 4 mg via INTRAVENOUS
  Filled 2012-12-14: qty 2

## 2012-12-14 MED ORDER — NITROGLYCERIN 2 % TD OINT
0.5000 [in_us] | TOPICAL_OINTMENT | TRANSDERMAL | Status: DC
Start: 2012-12-14 — End: 2012-12-14
  Administered 2012-12-14: 0.5 [in_us] via TOPICAL
  Filled 2012-12-14: qty 1

## 2012-12-14 MED ORDER — SODIUM CHLORIDE 0.9 % IV BOLUS
1000.0000 mL | Freq: Once | INTRAVENOUS | Status: AC
Start: 2012-12-14 — End: 2012-12-14
  Administered 2012-12-14: 1000 mL via INTRAVENOUS

## 2012-12-14 MED ORDER — NITROGLYCERIN 0.4 MG SL SUBL
0.4000 mg | SUBLINGUAL_TABLET | SUBLINGUAL | Status: DC | PRN
Start: 2012-12-14 — End: 2012-12-14
  Administered 2012-12-14 (×2): 0.4 mg via SUBLINGUAL
  Filled 2012-12-14: qty 1

## 2012-12-14 MED ORDER — NITROGLYCERIN 0.4 MG SL SUBL
0.4000 mg | SUBLINGUAL_TABLET | SUBLINGUAL | Status: DC | PRN
Start: 2012-12-14 — End: 2012-12-15

## 2012-12-14 MED ORDER — MORPHINE SULFATE 2 MG/ML IJ/IV SOLN (WRAP)
2.0000 mg | Status: DC | PRN
Start: 2012-12-14 — End: 2012-12-15
  Administered 2012-12-14 – 2012-12-15 (×4): 2 mg via INTRAVENOUS
  Filled 2012-12-14 (×4): qty 1

## 2012-12-14 MED ORDER — LISINOPRIL 20 MG PO TABS
20.0000 mg | ORAL_TABLET | Freq: Every day | ORAL | Status: DC
Start: 2012-12-14 — End: 2012-12-15
  Administered 2012-12-14: 20 mg via ORAL
  Filled 2012-12-14: qty 1

## 2012-12-14 MED ORDER — NICOTINE 21 MG/24HR TD PT24
1.0000 | MEDICATED_PATCH | Freq: Every day | TRANSDERMAL | Status: DC
Start: 2012-12-14 — End: 2012-12-15
  Administered 2012-12-14: 1 via TRANSDERMAL
  Filled 2012-12-14: qty 1

## 2012-12-14 MED ORDER — ATORVASTATIN CALCIUM 20 MG PO TABS
10.0000 mg | ORAL_TABLET | Freq: Every day | ORAL | Status: DC
Start: 2012-12-14 — End: 2012-12-15
  Administered 2012-12-14: 10 mg via ORAL
  Filled 2012-12-14: qty 1

## 2012-12-14 MED ORDER — ONDANSETRON HCL 4 MG/2ML IJ SOLN
4.0000 mg | Freq: Four times a day (QID) | INTRAMUSCULAR | Status: AC | PRN
Start: 2012-12-14 — End: 2012-12-14

## 2012-12-14 MED ORDER — ENOXAPARIN SODIUM 100 MG/ML SC SOLN
1.0000 mg/kg | Freq: Two times a day (BID) | SUBCUTANEOUS | Status: DC
Start: 2012-12-14 — End: 2012-12-15
  Administered 2012-12-14: 90 mg via SUBCUTANEOUS
  Filled 2012-12-14 (×2): qty 1

## 2012-12-14 MED ORDER — ATORVASTATIN CALCIUM 20 MG PO TABS
20.0000 mg | ORAL_TABLET | Freq: Every day | ORAL | Status: DC
Start: 2012-12-14 — End: 2012-12-14

## 2012-12-14 MED ORDER — NITROGLYCERIN 2 % TD OINT
0.5000 [in_us] | TOPICAL_OINTMENT | Freq: Four times a day (QID) | TRANSDERMAL | Status: DC
Start: 2012-12-14 — End: 2012-12-14

## 2012-12-14 MED ORDER — ACETAMINOPHEN 325 MG PO TABS
650.0000 mg | ORAL_TABLET | ORAL | Status: DC | PRN
Start: 2012-12-14 — End: 2012-12-15

## 2012-12-14 NOTE — ED Notes (Signed)
Cardiologist bedside

## 2012-12-14 NOTE — ED Notes (Addendum)
Pt at [employer] and developed L chest pain 10/10 took one nitro with little relief.  Was nauseated, SOB and diaphoretic.  Skin warm and dry no diff breathing.  Pt alert .  Pt given fentanyl by EMS

## 2012-12-14 NOTE — Plan of Care (Signed)
Problem: Chest Pain  Goal: Vital signs and cardiac rhythm stable  Outcome: Progressing  Pt lying in bed watching TV.  Pt is requesting snacks and drinks intermittantly with complaints of chest pain.  Pt complained of left sided chest pain 7/10 and requested more morphine at 2000. At  follow up at 2100 he was in bed watching TV with no signs of distress.   At 2130 he requested a snack box.  At 2300 he complained again of chest pain 5/10 and wanted more morphine.  At 2320 he was asked how his pain was and he responded, "3/10". He appears comfortable with no signs of distress.  Dr. Allena Katz was in to see him and requested the patient be NPO at midnight.  Pt is SR on the monitor.  B/p wnl's.   Will continue to monitor and assess patient.

## 2012-12-14 NOTE — ED Provider Notes (Signed)
Physician/Midlevel provider first contact with patient: 12/14/12 1347         EMERGENCY DEPARTMENT HISTORY AND PHYSICAL EXAM    Patient Name: [name] [name], [name] [name]  Encounter Date:  12/14/2012  Attending Physician: Elray Mcgregor, MD  Patient DOB:  [birth year]  MRN:  [id number]  Room:  Z563/O756-43    History of Presenting Illness     Historian: Pt    42 y.o. male with h/o HTN, high chol, CAD, brain surgery, and back surgery presents via EMS with a sudden onset of persistent midsternal CP while shopping at a hardware store 1.5 hours ago. Associated with SOB, nausea, and leg paresthesias. Pt states he was admitted to a hospital 8 months ago, but left because he refused a stress test. Last stress test 3 years ago. No fever, cough, or leg pain.    PMD:  Pcp, Noneorunknown, MD    Nursing Notes Review  Nursing Notes were reviewed.     Previous Records Review  Previous records were reviewed to the extent practicable for the current presentation.    Past Medical History     Past Medical History   Diagnosis Date   . Hypertension    . High cholesterol    . CAD (coronary artery disease)        Current facility-administered medications:acetaminophen (TYLENOL) tablet 650 mg, 650 mg, Oral, Q4H PRN, Allena Katz, Piyush R, MD;  aspirin chewable tablet 81 mg, 81 mg, Oral, Daily, Patel, Piyush R, MD;  aspirin EC tablet 81 mg, 81 mg, Oral, Daily, Patel, Piyush R, MD;  atorvastatin (LIPITOR) tablet 10 mg, 10 mg, Oral, Daily, Patel, Piyush R, MD;  atorvastatin (LIPITOR) tablet 20 mg, 20 mg, Oral, Daily, Patel, Piyush R, MD  enoxaparin (LOVENOX) syringe 90 mg, 1 mg/kg, Subcutaneous, Q12H SCH, Tanenbaum, Baptiste P, MD;  [COMPLETED] HYDROmorphone (DILAUDID) injection 1 mg, 1 mg, Intravenous, Once, Elray Mcgregor, MD, 1 mg at 12/14/12 1535;  lisinopril (PRINIVIL,ZESTRIL) tablet 20 mg, 20 mg, Oral, Daily, Patel, Piyush R, MD;  morphine injection 2 mg, 2 mg, Intravenous, Q2H PRN, Allena Katz, Piyush R, MD;  morphine injection 2 mg, 2 mg, Intravenous,  Q2H PRN, Melida Gimenez, MD  nitroglycerin (NITRO-BID) 2 % ointment 0.5 inch, 0.5 inch, Topical, Q6H SCH, Tanenbaum, Sheikh P, MD;  nitroglycerin (NITRO-BID) 2 % ointment 0.5 inch, 0.5 inch, Topical, Q6H HOLD MN, Patel, Piyush R, MD;  nitroglycerin (NITROSTAT) SL tablet 0.4 mg, 0.4 mg, Sublingual, Q5 Min PRN, Elray Mcgregor, MD, 0.4 mg at 12/14/12 1436;  nitroglycerin (NITROSTAT) SL tablet 0.4 mg, 0.4 mg, Sublingual, Q5 Min PRN, Allena Katz, Piyush R, MD  [COMPLETED] ondansetron (ZOFRAN) injection 4 mg, 4 mg, Intravenous, Once, Elray Mcgregor, MD, 4 mg at 12/14/12 1426;  ondansetron (ZOFRAN) injection 4 mg, 4 mg, Intravenous, Q6H PRN, Melida Gimenez, MD;  sodium chloride (PF) 0.9 % flush 3 mL, 3 mL, Intravenous, Q8H, Patel, Piyush R, MD;  [COMPLETED] sodium chloride 0.9 % bolus 1,000 mL, 1,000 mL, Intravenous, Once, Elray Mcgregor, MD, 1,000 mL at 12/14/12 1425    Allergies   Allergen Reactions   . Toradol (Ketorolac Tromethamine)    . Ultram (Tramadol Hcl)        Past Surgical History     Past Surgical History   Procedure Date   . Brain surgery    . Back surgery        Family History   The family history is not significantly contributory to current presentation.   History reviewed. No pertinent family  history.    Social History   Social history is not significantly contributory to the patient's current presentation.   History     Social History   . Marital Status: Divorced     Spouse Name: N/A     Number of Children: N/A   . Years of Education: N/A     Social History Main Topics   . Smoking status: Current Every Day Smoker   . Smokeless tobacco: Not on file   . Alcohol Use: No   . Drug Use: No   . Sexually Active: Not on file     Other Topics Concern   . Not on file     Social History Narrative   . No narrative on file       Home Medications     Home medications reviewed by ED MD at 1:47 PM     Previous Medications    ASPIRIN EC 81 MG EC TABLET    Take 81 mg by mouth daily.    ATORVASTATIN (LIPITOR) 10 MG  TABLET    Take 10 mg by mouth daily.    LISINOPRIL (PRINIVIL,ZESTRIL) 20 MG TABLET    Take 20 mg by mouth daily.    NITROGLYCERIN (NITROSTAT) 0.4 MG SL TABLET    Place 0.4 mg under the tongue every 5 (five) minutes as needed.       Review of Systems     See HPI for review of systems that is relevant to the current presentation.   All other systems reviewed: negative.     Physical Exam     Physical Exam    Constitutional:No acute distress. Not ashen.   Eyes: No conjunctival discharge. EOMI.   Head, Ears, Nose, Mouth, Throat: Normocephalic. Moist mucous membranes.   Neck: Full range of motion. No tracheal deviation. No JVD.  Cardiovascular: RRR. No obviously abnormal heart sounds. Normal capillary refill.   Respiratory/Chest: CTAB. No resp distress. No obvious abnormal breath sounds.   Gastrointestinal/Abdominal:  No abdominal tenderness. Soft abdomen. No abdominal distention.   Musculoskeletal: No LEE. Normal ROM. No focal extremity tenderness.   Back: No deformity. No significant scoliosis.   Neurological: Alert. No acute focal deficits.   Skin: Warm skin. No acute rash.     Vital Signs  BP 117/77  Pulse 75  Temp 98.3 F (36.8 C)  Resp 20  Ht 1.854 m  Wt 89.812 kg  BMI 26.13 kg/m2  SpO2 98%    Review of Vital Signs  The patient's vital signs were reviewed and interpreted by me, Elray Mcgregor, MD.    ED Medications Administered     ED Medication Orders      Start     Status Ordering Provider    12/14/12 1533   HYDROmorphone (DILAUDID) injection 1 mg   Once      Route: Intravenous  Ordered Dose: 1 mg         Last MAR action:  Given Charliegh Vasudevan N    12/14/12 1356   ondansetron (ZOFRAN) injection 4 mg   Once      Comments: This order is post-timed automatically by Epic--but please give this now.    Route: Intravenous  Ordered Dose: 4 mg         Last MAR action:  Given Brandee Markin N    12/14/12 1356   sodium chloride 0.9 % bolus 1,000 mL   Once      Comments: This order is post-timed  automatically by  Epic--but please give this now.    Route: Intravenous  Ordered Dose: 1,000 mL         Last MAR action:  Stopped Honesty Menta N    12/14/12 1355   nitroglycerin (NITROSTAT) SL tablet 0.4 mg   Every 5 min PRN      Comments: This order is post-timed automatically by Epic--but please give this now.    Route: Sublingual  Ordered Dose: 0.4 mg         Last MAR action:  Given Aranda Bihm N                Orders Placed During This Encounter     Orders Placed This Encounter   Procedures   . XR Chest AP Portable   . Basic Metabolic Panel   . CBC and differential   . Creatine Kinase (CK) with MB if indicated   . Troponin I   . GFR   . CK-MB   . CREATINE KINASE LEVEL (CK)   . Troponin I   . Comprehensive metabolic panel   . Lipid panel (Fasting)   . CBC   . Troponin I    . Diet NPO time specified Except for: SIPS WITH MEDS   . Diet cardiac   . Cardiac monitoring    . Nasal Cannula Low-Flow (5 lpm or Less)   . Vital signs   . Pulse Oximetry   . Cardiac monitoring (Telemetry)   . Monitored Transport (without monitor)   . Nasal Cannula Low-Flow (5 LPM or less)   . Cardiac Monitor-May transport OFF monitor   . Bed Rest with Bathroom Privileges   . ED Holding Orders Expire in 8 Hours   . Notify Admitting Attending ( Change in Condition)   . Notify Attending of Patient Arrival to Floor within 8 Hours   . Notify Physician (Vital Signs)   . Notify Physician (Lab Results)   . Vital Signs Q4HR   . Full Code   . ED Unit Sec Comm Order   . ECG 12 lead   . ECG 12 lead   . ECG 12 lead (Electrocardiogram)   . Saline lock IV   . Saline lock IV   . Saline Lock   . Place (admit) for Observation Services   . Einar Gip ED Bed Request       Diagnostic Study Results     The results of the diagnostic studies below were reviewed by the ED provider:    Labs  Results     Procedure Component Value Units Date/Time    CK-MB [[id number]] Collected:12/14/12 1340     Creatinine Kinase MB (CKMB) 2.5 ng/mL Updated:12/14/12 1427     Basic Metabolic Panel [[id number]]  (Abnormal) Collected:12/14/12 1340    Specimen Information:Blood Updated:12/14/12 1411     Glucose 102 (H) mg/dL      BUN 14 mg/dL      Creatinine 0.9 mg/dL      CALCIUM 9.1 mg/dL      Sodium 147      Potassium 3.8      Chloride 108 (H)      CO2 22      Anion Gap 11.0     Creatine Kinase (CK) with MB if indicated [[id number]]  (Abnormal) Collected:12/14/12 1340    Specimen Information:Blood Updated:12/14/12 1411     Creatine Kinase (CK) 372 (H) U/L     GFR [[id number]] Collected:12/14/12 1340     EGFR >60.0 Updated:12/14/12 1411  Troponin I [[id number]] Collected:12/14/12 1340    Specimen Information:Blood Updated:12/14/12 1410     Troponin I 0.01 ng/mL     CBC and differential [[id number]]  (Abnormal) Collected:12/14/12 1340    Specimen Information:Blood / Blood Updated:12/14/12 1354     WBC 5.86      RBC 4.05 (L)      Hgb 10.8 (L) g/dL      Hematocrit 14.7 (L) %      MCV 81.0 fL      MCH 26.7 (L) pg      MCHC 32.9 g/dL      RDW 15 %      Platelets 272      MPV 9.3 (L) fL      Neutrophils 66 %      Lymphocytes Automated 24 %      Monocytes 6 %      Eosinophils Automated 3 %      Basophils Automated 0 %      Immature Granulocyte 0 %      Nucleated RBC 0      Neutrophils Absolute 3.88      Abs Lymph Automated 1.38      Abs Mono Automated 0.38      Abs Eos Automated 0.20      Absolute Baso Automated 0.02      Absolute Immature Granulocyte 0.01           Radiologic Studies  Radiology Results (24 Hour)     Procedure Component Value Units Date/Time    XR Chest AP Portable [[id number]] Collected:12/14/12 1420    Order Status:Completed  Updated:12/14/12 1425    Narrative:    Reason for exam: 42 year old male with chest pain.    FINDINGS:  Portable AP view.  The heart size and contour are normal.  Lungs are clear with normal  pulmonary vascularity.  No pleural effusion, hilar or mediastinal  prominence is evident. There is no pneumothorax. Wire leads overlie the  chest.      Impression:      No  active cardiopulmonary disease.        Wilmon Pali, MD   12/14/2012 2:20 PM          Scribe and MD Attestations     I, Elray Mcgregor, MD, personally performed the services documented. Crisoforo Oxford is scribing for me on [facility]. I reviewed and confirm the accuracy of the information in this medical record.     I, Crisoforo Oxford, am serving as a Neurosurgeon to document services personally performed by Elray Mcgregor, MD, based on the provider's statements to me.     Rendering Provider: Elray Mcgregor, MD    ED Course, Monitors, EKG, Critical Care, Splints, Consults, Reevaluation, etc     EKG Interpretation  12 Lead EKG interpreted by Dr Arvilla Market shows:  Normal sinus rhythm.  Normal rate at 91.  Normal conduction intervals.   No acute significant ST or T wave changes.   Normal EKG.     Cardiac Monitor Interpretation  CM interpreted by Dr Arvilla Market shows:  NSR at 91 bpm. No ectopy.    1440: D/w Dr. Darryll Capers, who will admit.    1443: D/w Dr. Page Spiro, who will see him.     No ASA because of toredal allergy. Will leave anticoagulation at the discretion of the admitting tea    MDM and Clinical Notes     Hx and Exam Synthesis and Differential Diagnosis  MDM: CP with apparent history of CAD and apparent MI per pt. He doesn't remember which hospital he was at in [facility]. Sxs not suggestive of PE.    Plan    Labs. Troponin. X-ray. Nitro. Nausea control. Will likely admit to rule out ACS    Diagnosis and Disposition     Clinical Impression  1. Chest pain        Disposition  ED Disposition     Observation Admitting Physician: Lenox Ponds [[id number]]  Diagnosis: Chest pain [[id number]]  Estimated Length of Stay: < 2 midnights  Tentative Discharge Plan?: Home or Self Care [1]  Patient Class: Observation [104]            Prescriptions     New Prescriptions    No medications on file               Elray Mcgregor, MD  12/14/12 1605

## 2012-12-14 NOTE — Consults (Signed)
 HEART PRELIMINARY CONSULTATION    Date Time: 12/14/2012 3:22 PM  Patient Name: [facility]     Full consultation dictated.      Impression:     1.Chest pain  2.History of apparent MI 2 years ago in [place] treated medically  3.Chronic back and knee pain  4.Hypertension  5.Hyperlipidemia  6.History of smoking  7.Family history of CV disease  8.Mild anemia      Recommendations:     1.Agree with admission to telemetry monitoring  2.Add nitroglycerin and lovenox to aspirin empirically for coronary insufficiency  3.Continue lisinopril for hypertension  4.Discontinue smoking  5.Continue lipitor for hyperlipidemia  6.Attempt to obtain prior CV records from [place] for review  7.If r/o MI and does not have significant recurrent chest pain, lexiscan nuclear perfusion study  8.If r/i MI or has significant recurrent chest pain, cardiac catheterization guided therapy        Carrell P. Cassell Smiles, M.D., F.A.C.C.

## 2012-12-14 NOTE — H&P (Signed)
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     HISTORY OF PRESENT ILLNESS:  This pleasant 42 year old gentleman, who moved to this area from [place]  3 weeks ago, came to the emergency room by the EMS with sudden onset of  left-sided chest pain while shopping at a hardware store 1-1/2 hours prior  to arrival.  Complaint of shortness of breath with cough and paresthesia in  the legs.  He said he was admitted to hospital 8 months ago, but left  because he refused a stress test.  Last stress test was 3 years ago.  There  is no fever, cough, leg pain, no dyspnea on exertion.  He does smoke,  however.     PAST MEDICAL HISTORY:  Hypertension, hyperlipidemia, coronary artery disease, history of back  injury and traumatic brain injury.     PAST SURGICAL HISTORY:  Back surgery.     ALLERGIES:  TORADOL and ULTRAM.     FAMILY HISTORY:  There is no family history of coronary artery disease.     SOCIAL HISTORY:  The patient currently is an everyday smoker, denies any alcohol.  He is  divorced and denies any illicit drug abuse.     MEDICATIONS:  At home, aspirin 81 mg a day, Lipitor 10 mg a day, lisinopril 20 mg a day,  nitroglycerin 0.4 sublingual p.r.n.     REVIEW OF SYSTEMS:  Patient complained of chest pain, some palpitation, and denied any syncope.   He had shortness of breath.  No diaphoresis.  There is no history of  heartburn, he says.  No nausea, vomiting, diarrhea, hematemesis, melena,  hematochezia.  No dysuria, frequency.  No fall, seizure.  Denies any recent  loss or gain of weight.  He complained of tingling, numbness of the feet.   Denies any unusual behavior and mental status  changes.     PHYSICAL EXAMINATION:  VITAL SIGNS:  Temperature 98.3 F, pulse 75, respirations 20, blood pressure  117/77.  His oxygen saturation is 98% on 4 liters, 85 cm tall, and 90 kg.  HEENT:  He is atraumatic, normocephalic, obese.  NECK:  Supple, there is no JVD, thyromegaly, or adenopathy.  The central  pharynx is clear.  Airway is patent.  CHEST:  Clear to auscultation and percussion bilaterally, no rales or  wheeze.  HEART:  S1, S2.  No click, thrill, rub.  ABDOMEN:  Soft.  Bowel sounds plus, nontender, no guarding, rigidity, no  adenopathy.  EXTREMITIES:  No CCE.  NEUROLOGIC:  Nonfocal.     LABORATORY DATA:  Initial WBC is 5.9; hemoglobin 10.8; platelets 272,000.  Sodium 141,  potassium 3.8, chloride 108, bicarbonate 22, BUN 14, creatinine 0.9,  glucose 102.  CPK is 372, troponin 0.01.     Chest x-ray does not reveal any acute cardiopulmonary disease.  Urine is  negative for amphetamines, barbiturates, benzodiazepines.     ASSESSMENT AND PLAN:  Thus, in the emergency room, patient received Zofran, nitroglycerin, and  saline.  Dilaudid helped him a lot.  Nitroglycerin did not.  Thus, we shall  admit him to telemetry, rule out ischemia, request cardiology consultation.   Perhaps  he can have a Lexiscan if he rules out.  We have advised him to  stop smoking.  Further treatment depending on the course of events.   Incidentally, he did have a visit in 2008.           D:  12/14/2012 21:32 PM by Dr. Lenox Ponds, MD [phone])  T:  12/14/2012 22:18 PM by       Everlean Cherry: [id number]) (Doc ID: [id number])

## 2012-12-14 NOTE — ED Notes (Signed)
Bed:B16<BR> Expected date:<BR> Expected time:<BR> Means of arrival:<BR> Comments:<BR> m,417

## 2012-12-14 NOTE — Progress Notes (Signed)
[name] [name] is a 42 y.o. male patient.  Active Problems:   Chest pain    Past Medical History   Diagnosis Date   . Hypertension    . High cholesterol    . CAD (coronary artery disease)      Current Facility-Administered Medications   Medication Dose Route Frequency Provider Last Rate Last Dose   . acetaminophen (TYLENOL) tablet 650 mg  650 mg Oral Q4H PRN Lenox Ponds, MD       . Melene Muller ON 12/15/2012] aspirin EC tablet 81 mg  81 mg Oral Daily Rozelle Caudle R, MD       . atorvastatin (LIPITOR) tablet 10 mg  10 mg Oral Daily Darryll Capers R, MD   10 mg at 12/14/12 1713   . enoxaparin (LOVENOX) syringe 90 mg  1 mg/kg Subcutaneous Q12H SCH Tanenbaum, Phyllis Ginger, MD   90 mg at 12/14/12 1713   . [COMPLETED] HYDROmorphone (DILAUDID) injection 1 mg  1 mg Intravenous Once Elray Mcgregor, MD   1 mg at 12/14/12 1535   . lisinopril (PRINIVIL,ZESTRIL) tablet 20 mg  20 mg Oral Daily Darryll Capers R, MD   20 mg at 12/14/12 1713   . morphine injection 2 mg  2 mg Intravenous Q2H PRN Lenox Ponds, MD   2 mg at 12/14/12 1952   . nicotine (NICODERM CQ) 21 MG/24HR patch 1 patch  1 patch Transdermal Daily Darryll Capers R, MD   1 patch at 12/14/12 1714   . nitroglycerin (NITROSTAT) SL tablet 0.4 mg  0.4 mg Sublingual Q5 Min PRN Allena Katz, Aara Jacquot R, MD       . [COMPLETED] ondansetron (ZOFRAN) injection 4 mg  4 mg Intravenous Once Elray Mcgregor, MD   4 mg at 12/14/12 1426   . ondansetron (ZOFRAN) injection 4 mg  4 mg Intravenous Q6H PRN Melida Gimenez, MD       . sodium chloride (PF) 0.9 % flush 3 mL  3 mL Intravenous Q8H Renaye Janicki R, MD       . [COMPLETED] sodium chloride 0.9 % bolus 1,000 mL  1,000 mL Intravenous Once Elray Mcgregor, MD   1,000 mL at 12/14/12 1425   . [DISCONTINUED] aspirin chewable tablet 81 mg  81 mg Oral Daily Allena Katz, Sylvi Rybolt R, MD       . [DISCONTINUED] atorvastatin (LIPITOR) tablet 20 mg  20 mg Oral Daily Allena Katz, Niasha Devins R, MD       . [DISCONTINUED] morphine injection 2 mg  2 mg Intravenous  Q2H PRN Melida Gimenez, MD       . [DISCONTINUED] nitroglycerin (NITRO-BID) 2 % ointment 0.5 inch  0.5 inch Topical Q6H SCH Tanenbaum, Phyllis Ginger, MD       . [DISCONTINUED] nitroglycerin (NITRO-BID) 2 % ointment 0.5 inch  0.5 inch Topical Q6H HOLD MN Allena Katz, Toniette Devera R, MD   0.5 inch at 12/14/12 1714   . [DISCONTINUED] nitroglycerin (NITROSTAT) SL tablet 0.4 mg  0.4 mg Sublingual Q5 Min PRN Elray Mcgregor, MD   0.4 mg at 12/14/12 1436     Allergies   Allergen Reactions   . Toradol (Ketorolac Tromethamine)    . Ultram (Tramadol Hcl)      Blood pressure 115/56, pulse 78, temperature 96.9 F (36.1 C), temperature source Oral, resp. rate 18, height 1.854 m (6\' 1" ), weight 89.812 kg (198 lb), SpO2 96.00%.    Subjective:  Symptoms:  Stable.  He reports malaise, chest pain and weakness.  No  shortness of breath.    Diet:  Adequate intake.  No nausea.    Activity level: Impaired due to weakness.    Pain:  He complains of pain that is mild.  He reports pain is improving.  Pain is well controlled.    Review of Systems   Constitutional: Negative for fever.   HENT: Negative for nosebleeds, congestion and ear discharge.    Eyes: Positive for photophobia. Negative for blurred vision and discharge.   Respiratory: Negative for shortness of breath.    Cardiovascular: Positive for chest pain. Negative for orthopnea and claudication.   Gastrointestinal: Negative for nausea and blood in stool.   Genitourinary: Negative for dysuria, urgency and frequency.   Neurological: Positive for weakness. Negative for sensory change and speech change.   Endo/Heme/Allergies: Does not bruise/bleed easily.   Psychiatric/Behavioral: Negative for substance abuse. The patient is not nervous/anxious.          Objective:  General Appearance:  Comfortable, ill-appearing, in no acute distress and not in pain.    Vital signs: (most recent): Blood pressure 115/56, pulse 78, temperature 96.9 F (36.1 C), temperature source Oral, resp. rate 18, height 1.854 m  (6\' 1" ), weight 89.812 kg (198 lb), SpO2 96.00%.  Vital signs are normal.  No fever.    Output: Producing urine and producing stool.    HEENT: Normal HEENT exam.    Lungs:  Breath sounds clear to auscultation.  There are decreased breath sounds.  No wheezes or rhonchi.    Heart: Normal rate.  S1 normal.  No murmur or gallop.   Chest: No chest wall tenderness.  Symmetric chest wall expansion.   Extremities: Normal range of motion.  There is no venous stasis, local swelling or dependent edema.    Neurological: Patient is alert.  Patient has normal reflexes, normal muscle tone and normal coordination.    Skin:  Warm and pale.  No rash.   Abdomen: Abdomen is soft and non-distended.  There are no signs of ascites.    Bowel sounds:  Bowel sounds are normal.  There is generalized tenderness.   There is no mass. There is no splenomegaly. There is no hepatomegaly.   Pupils:  Pupils are equal, round, and reactive to light.    Pulses: Distal pulses are intact.    MDM  Number of Diagnoses or Management Options  Chest pain: established, improving     Amount and/or Complexity of Data Reviewed  Clinical lab tests: reviewed  Tests in the radiology section of CPT: reviewed  Tests in the medicine section of CPT: reviewed  Discussion of test results with the performing providers: yes  Decide to obtain previous medical records or to obtain history from someone other than the patient: yes  Obtain history from someone other than the patient: yes  Review and summarize past medical records: yes  Discuss the patient with other providers: yes  Independent visualization of images, tracings, or specimens: yes    Risk of Complications, Morbidity, and/or Mortality  Presenting problems: moderate  Diagnostic procedures: moderate  Management options: moderate    Critical Care  Total time providing critical care: 30-74 minutes    Patient Progress  Patient progress: stable      Assessment:  (Chest pain   Non-Hospital   CAD (coronary artery disease)    High cholesterol   Hypertension   Tobacco use).     Plan:   Consults: cardiology.  (Tele  troponins  Pain control  lexiscan in am).  Jabrea Kallstrom R Allena Katz  12/14/2012
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
## 2012-12-14 NOTE — Plan of Care (Signed)
Problem: Chest Pain  Goal: Vital signs and cardiac rhythm stable  Intervention: Monitor/assess vital signs/cardiac rhythm.  Vitals stable. NSR on monitor.

## 2012-12-15 LAB — CBC
Hematocrit: 34.3 % — ABNORMAL LOW (ref 42.0–52.0)
Hgb: 11.2 g/dL — ABNORMAL LOW (ref 13.0–17.0)
MCH: 26.2 pg — ABNORMAL LOW (ref 28.0–32.0)
MCHC: 32.7 g/dL (ref 32.0–36.0)
MCV: 80.3 fL (ref 80.0–100.0)
MPV: 9.4 fL (ref 9.4–12.3)
Nucleated RBC: 0 (ref 0–1)
Platelets: 281 (ref 140–400)
RBC: 4.27 — ABNORMAL LOW (ref 4.70–6.00)
RDW: 15 % (ref 12–15)
WBC: 5.32 (ref 3.50–10.80)

## 2012-12-15 LAB — COMPREHENSIVE METABOLIC PANEL
ALT: 15 U/L (ref 0–55)
AST (SGOT): 15 U/L (ref 5–34)
Albumin/Globulin Ratio: 1.5 (ref 0.9–2.2)
Albumin: 3.6 g/dL (ref 3.5–5.0)
Alkaline Phosphatase: 80 U/L (ref 40–150)
Anion Gap: 9 (ref 5.0–15.0)
BUN: 16 mg/dL (ref 9–21)
Bilirubin, Total: 0.2 mg/dL (ref 0.2–1.2)
CO2: 22 (ref 22–29)
Calcium: 8.9 mg/dL (ref 8.5–10.5)
Chloride: 106 (ref 98–107)
Creatinine: 0.8 mg/dL (ref 0.7–1.3)
Globulin: 2.4 g/dL (ref 2.0–3.6)
Glucose: 100 mg/dL (ref 70–100)
Potassium: 4.6 (ref 3.5–5.1)
Protein, Total: 6 g/dL (ref 6.0–8.3)
Sodium: 137 (ref 136–145)

## 2012-12-15 LAB — ECG 12-LEAD
Atrial Rate: 91 {beats}/min
Atrial Rate: 59 {beats}/min
P Axis: 70 degrees
P Axis: 27 degrees
P-R Interval: 136 ms
P-R Interval: 136 ms
Q-T Interval: 338 ms
Q-T Interval: 404 ms
QRS Duration: 96 ms
QRS Duration: 102 ms
QTC Calculation (Bezet): 415 ms
QTC Calculation (Bezet): 399 ms
R Axis: 48 degrees
R Axis: 66 degrees
T Axis: 31 degrees
T Axis: 46 degrees
Ventricular Rate: 91 {beats}/min
Ventricular Rate: 59 {beats}/min

## 2012-12-15 LAB — LIPID PANEL
Cholesterol / HDL Ratio: 3.7
Cholesterol: 122 mg/dL (ref 0–199)
HDL: 33 mg/dL — ABNORMAL LOW (ref >40)
LDL Calculated: 71 mg/dL (ref 0–99)
Triglycerides: 90 mg/dL (ref 34–149)
VLDL Calculated: 18 mg/dL (ref 10–40)

## 2012-12-15 LAB — TROPONIN I: Troponin I: <0.01 ng/mL (ref 0.00–0.09)

## 2012-12-15 LAB — CK: Creatine Kinase (CK): 230 U/L — ABNORMAL HIGH (ref 30–200)

## 2012-12-15 LAB — CKMB: Creatinine Kinase MB (CKMB): 1.5 ng/mL (ref 0.0–4.9)

## 2012-12-15 LAB — HEMOLYSIS INDEX: Hemolysis Index: 8 (ref 0–9)

## 2012-12-15 LAB — GFR: EGFR: >60

## 2012-12-15 NOTE — Progress Notes (Signed)
Pt signed out AMA. Pt did not want stress test. Pt educated as to the benefits of having the test, however, pt said it made him to nervous. Educational materials regarding a stress test offered, however, pt refused.  Pt alert and oriented and gait steady prior to leaving the unit. Did not complain of chest pain. Will notify MD.
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
## 2013-04-22 LAB — ECG 12-LEAD
P Wave Axis: 17 deg
P Wave Axis: 65 deg
P Wave Duration: 102 ms
P Wave Duration: 98 ms
P-R Interval: 124 ms
P-R Interval: 132 ms
Patient Age: 37 years
Patient Age: 37 years
Q-T Dispersion: 40 ms
Q-T Dispersion: 64 ms
Q-T Interval(Corrected): 394 ms
Q-T Interval(Corrected): 405 ms
Q-T Interval: 334 ms
Q-T Interval: 358 ms
QRS Axis: 15 deg
QRS Axis: 66 deg
QRS Duration: 100 ms
QRS Duration: 98 ms
T Axis: -6 deg
T Axis: 7 deg
Ventricular Rate: 101 /min
Ventricular Rate: 81 /min

## 2013-10-12 ENCOUNTER — Emergency Department
Admission: EM | Admit: 2013-10-12 | Discharge: 2013-10-13 | Disposition: A | Discharge status: Home / Self Care | Payer: Medicaid - Out of State | Attending: Emergency Medicine | Admitting: Emergency Medicine

## 2013-10-12 DIAGNOSIS — I10 Essential (primary) hypertension: Secondary | ICD-10-CM | POA: Insufficient documentation

## 2013-10-12 DIAGNOSIS — R079 Chest pain, unspecified: Secondary | ICD-10-CM

## 2013-10-12 DIAGNOSIS — R209 Unspecified disturbances of skin sensation: Secondary | ICD-10-CM | POA: Insufficient documentation

## 2013-10-12 DIAGNOSIS — R0789 Other chest pain: Secondary | ICD-10-CM | POA: Insufficient documentation

## 2013-10-12 DIAGNOSIS — I252 Old myocardial infarction: Secondary | ICD-10-CM | POA: Insufficient documentation

## 2013-10-12 DIAGNOSIS — F17200 Nicotine dependence, unspecified, uncomplicated: Secondary | ICD-10-CM | POA: Insufficient documentation

## 2013-10-12 LAB — TROPONIN T, BLOOD
Troponin T: <0.01 ng/mL (ref <0.01)
Troponin T: <0.01 ng/mL (ref <0.01)

## 2013-10-12 LAB — BASIC METABOLIC PANEL, BLOOD
Anion Gap: 11 mmol/L (ref 7–15)
BUN: 14 mg/dL (ref 6–20)
Bicarbonate: 26 mmol/L (ref 22–29)
CHLORIDE: 103 mmol/L (ref 98–107)
Calcium: 8.7 mg/dL (ref 8.5–10.6)
Creatinine: 0.74 mg/dL (ref 0.67–1.17)
GFR: >60 mL/min
Glucose: 90 mg/dL (ref 70–115)
Potassium: 4.2 mmol/L (ref 3.5–5.1)
Sodium: 140 mmol/L (ref 136–145)

## 2013-10-12 LAB — CBC WITH DIFF, BLOOD
ANC-Automated: 2.4 10*3/uL (ref 1.6–7.0)
Abs Eosinophils: 0.2 10*3/uL (ref 0.1–0.5)
Abs Lymphs: 1.4 10*3/uL (ref 0.8–3.1)
Abs Monos: 0.6 10*3/uL (ref 0.2–0.8)
EOSINOPHILS: 5 % — ABNORMAL HIGH (ref 1–4)
Hct: 38.9 % — ABNORMAL LOW (ref 40.0–50.0)
Hgb: 13 gm/dL — ABNORMAL LOW (ref 13.7–17.5)
Lymphocytes: 30 % (ref 19–53)
MCH: 27.8 pg (ref 26.0–32.0)
MCHC: 33.4 % (ref 32.0–36.0)
MCV: 83.1 um3 (ref 79.0–95.0)
MPV: 9.4 fL (ref 9.4–12.4)
Monocytes: 13 % — ABNORMAL HIGH (ref 5–12)
Plt Count: 275 10*3/uL (ref 140–370)
RBC: 4.68 10*6/uL (ref 4.60–6.10)
RDW: 16.6 % — ABNORMAL HIGH (ref 12.0–14.0)
Segs: 52 % (ref 34–71)
WBC: 4.7 10*3/uL (ref 4.0–10.0)

## 2013-10-12 LAB — APTT, BLOOD: PTT: 29.5 s (ref 25.0–34.0)

## 2013-10-12 LAB — CKMB+INDEX (NO CPK), BLOOD
CK-MB INDEX: 1.7 %
CK-MB Index: 1.4 %
CK-MB: 2.2 ng/mL (ref 0.0–4.8)
CK-MB: 2.2 ng/mL (ref 0.0–4.8)

## 2013-10-12 LAB — PROTHROMBIN TIME, BLOOD
INR: 1.1
PT,PATIENT: 12.4 s (ref 9.7–12.5)

## 2013-10-12 LAB — CPK-CREATINE PHOSPHOKINASE, BLOOD
CPK: 155 U/L (ref 0–175)
CPK: 133 U/L (ref 0–175)

## 2013-10-12 MED ORDER — MORPHINE SULFATE 4 MG/ML IJ SOLN
6.00 mg | Freq: Once | INTRAMUSCULAR | Status: AC
Start: 2013-10-12 — End: 2013-10-12
  Administered 2013-10-12: 6 mg via INTRAVENOUS
  Filled 2013-10-12: qty 2

## 2013-10-12 MED ORDER — HYDROCODONE-ACETAMINOPHEN 5-325 MG OR TABS
1.00 | ORAL_TABLET | Freq: Once | ORAL | Status: AC
Start: 2013-10-12 — End: 2013-10-12
  Administered 2013-10-12: 1 via ORAL
  Filled 2013-10-12: qty 1

## 2013-10-12 NOTE — ED Notes (Signed)
12-lead EKG performed, handed to Dr. Neath. Copy placed in chart. Verified old EKG(s) on MUSE system.

## 2013-10-12 NOTE — ED Notes (Signed)
Repeat cardiac markers sent to lab

## 2013-10-12 NOTE — ED Provider Notes (Signed)
Chief Complaint  Chief Complaint   Patient presents with   . Chest Pain - Adult     cp sudden onset left neck radiates to back nausea no vomiting nitro sl x 3, 1" paste, 324 asa given in route. cp 9/10 pt. sent from [facility] voluntary       History of Present illness  [name] [name] is a 43 year old male hx prior MI, HTN, tobacco use at 5:30pm has substernal crushing chest pressure radiating to L shoulder. Was at [facility] earlier voluntary sitting watching TV when episode occurred. [facility] gave ASA and nitro en route with mild improvement. Pain associated with SOB. No diaphoresis. Not pleuritic. No cough or uri sx. Multiple presentations in past for similar. No prior stress tests. Of note he states he does feel numbness on L side of his face. No other weakness or focal neuro deficits.    Past Medical History  Past Medical History   Diagnosis Date   . Myocardial infarction    . ACS (acute coronary syndrome)    . Heart murmur    . Hypertension        Past Surgical History  Past Surgical History   Procedure Laterality Date   . Knee surgery     . Back surgery     . Surgery to the skull  from fracture       Medications  No current facility-administered medications on file prior to encounter.     Current Outpatient Prescriptions on File Prior to Encounter   Medication Sig Dispense Refill   . aspirin 81 MG chewable tablet Take 1 tablet by mouth daily. 30 tablet 0   . atorvastatin (LIPITOR) 20 MG tablet Take 1 tablet by mouth every evening. 30 tablet 0       Family Hx  Family history reviewed, +family hx of early MI    Social Hx  Y Tobacco  N EtOH  N IVDU  +MJ    Review of Systems  12 point ROS all reviewed and otherwise negative except per HPI.    Physical Exam  Physical Exam  4    10/12/13  1751 10/12/13  1924 10/12/13  1940 10/12/13  2034   BP: 104/56  94/57 104/73   Pulse: 73  67 70   Temp: 97.8 F (36.6 C)      Resp: 20  18 18    SpO2: 95% 98% 95% 97%         GEN: Well appearing and nontoxic  SKIN: Warm, well perfused, no diaphoresis,  pallor or rash  Head: Normocephalic, atraumatic  Eyes: normal conjunctiva, sclera anicteric  ENT: MMM, OP clear, normal nares  Neck: Supple, no stridor or nuchal rigidity  Pulm: No respiratory distress, CTAB  CV: RRR, distal pulses 3+   GI: abdomen soft, NT, ND  MUSCULOSKELETAL: Moves all four distal extremities well with full range of motion. no bony extremity tenderness. No lower extremity edema, swelling or cords.   NEURO: CN2-12 normal, strength 5/5 x4 ext, SILT, normal RAM, normal gat    ED Orders  Orders Placed This Encounter   Procedures   . X-Ray Chest Frontal And Lateral   . Basic Metabolic Panel, Blood Green Plasma Separator Tube   . CPK, Blood Green Plasma Separator Tube   . CKMB +Index (No CPK) Green Plasma Separator Tube   . Troponin T, Blood Green Plasma Separator Tube   . CBC w/Diff Lavender   . Prothrombin Time, Blood Blue   .  aPTT, Blood Blue   . CKMB +Index (No CPK) Green Plasma Separator Tube   . Troponin T, Blood Green Plasma Separator Tube   . CPK, Blood Green Plasma Separator Tube   . ECG, Complete       ED Labs  Labs Reviewed   CBC WITH ADIFF, BLOOD - Abnormal; Notable for the following:     Hgb 13.0 (*)     Hct 38.9 (*)     RDW 16.6 (*)     Monocytes 13 (*)     Eosinophils 5 (*)     All other components within normal limits   BASIC METABOLIC PANEL, BLOOD   CPK-CREATINE PHOSPHOKINASE, BLOOD   CKMB+INDEX (NO CPK) PANEL, BLOOD   TROPONIN T, BLOOD   PROTHROMBIN TIME, BLOOD   APTT, BLOOD   CKMB+INDEX (NO CPK) PANEL, BLOOD   TROPONIN T, BLOOD   CPK-CREATINE PHOSPHOKINASE, BLOOD       Imaging  Orders Placed This Encounter   Procedures   . X-Ray Chest Frontal And Lateral   X-ray Chest Frontal And Lateral    10/12/2013    EXAM DESCRIPTION: CHEST 2 VIEWS FRONTAL & LATERAL  CLINICAL HISTORY: Chest pain.  COMPARISON: 09/13/2012.  FINDINGS: There is no pleural effusion or pneumothorax demonstrated.  The lungs are well expanded and clear, without focal consolidation.  The cardiomediastinal silhouette is  unremarkable.  No acute osseous abnormality identified.  Stable prominent gas-filled colon in the upper abdomen.  IMPRESSION: No acute cardiopulmonary findings.   I have reviewed the images and agree with the resident's interpretation.      ED Medications Admisitered  Medications   morphine injection 6 mg (6 mg IntraVENOUS Given 10/12/13 2119)   HYDROcodone-acetaminophen (NORCO) 5-325 MG tablet 1 tablet (1 tablet Oral Given 10/12/13 2329)       EKG  EKG: NSR@63bpm , normal axis, normal intervals, non-specific st/t wave changes      Impression/MDM:  Ted McalpineMark Bardon is a 43 year old male presents with chest pain/pressure. EKG no ischemic changes or malignant dysrhythmia noted. Trop negative on arrival. Repeat CMs at 1130. CXR No acute cardiopulmonary findings. No sx to suggest PNA. No e/o pneumo or dissection. Overall atypical history for PE. Multiple visits in past for same presentation and history but leaves AMA prior to stress test. No recent stress test. Plan repeat CMs at 1130. If negative low suspicion for ACS and anticipate follow up as outpatient withh cardiology.     Breezy Hertenstein, Venia MinksNicholas Taylor, MD  Resident  10/12/13 [id number]    Joya GaskinsNeath, Sean-Xavier, MD  10/13/13 1220

## 2013-10-12 NOTE — ED Notes (Signed)
Pt continues to c/o 9/10 cp with new onset HA 5/10. Nitro removed approx 20 minutes ago. VS stable as charted. Pt also requesting another sandwich

## 2013-10-12 NOTE — ED Notes (Signed)
Bed: 12A  Expected date:   Expected time:   Means of arrival:   Comments:  [facility]

## 2013-10-12 NOTE — ED Notes (Signed)
This RN assuming care, in room to speak with pt, pt ringing call bell at this time, states he was calling for RN to c/o 9/10 L chest pain, not made worse with exertion or deep breath, sharp in nature. Also asking for food. Continuous cardiac monitoring in place.

## 2013-10-12 NOTE — ED Notes (Signed)
Bed: 24  Expected date:   Expected time:   Means of arrival:   Comments:

## 2013-10-12 NOTE — ED Notes (Signed)
23M here with atypical chest pain. Asa, ntg given en route. Discharge after 2nd set of cardiac markers. Has a history of leaving against medical advice prior to stress tests. Pt voluntary at [facility], but pt free to leave. Will discharge to home or [facility] if rpt troponin negative.     Labs Reviewed   CBC WITH ADIFF, BLOOD - Abnormal; Notable for the following:     Hgb 13.0 (*)     Hct 38.9 (*)     RDW 16.6 (*)     Monocytes 13 (*)     Eosinophils 5 (*)     All other components within normal limits   BASIC METABOLIC PANEL, BLOOD   CPK-CREATINE PHOSPHOKINASE, BLOOD   CKMB+INDEX (NO CPK) PANEL, BLOOD   TROPONIN T, BLOOD   PROTHROMBIN TIME, BLOOD   APTT, BLOOD   CKMB+INDEX (NO CPK) PANEL, BLOOD   TROPONIN T, BLOOD   CPK-CREATINE PHOSPHOKINASE, BLOOD     X-ray Chest Frontal And Lateral    10/12/2013    EXAM DESCRIPTION: CHEST 2 VIEWS FRONTAL & LATERAL  CLINICAL HISTORY: Chest pain.  COMPARISON: 09/13/2012.  FINDINGS: There is no pleural effusion or pneumothorax demonstrated.  The lungs are well expanded and clear, without focal consolidation.  The cardiomediastinal silhouette is unremarkable.  No acute osseous abnormality identified.  Stable prominent gas-filled colon in the upper abdomen.  IMPRESSION: No acute cardiopulmonary findings.   I have reviewed the images and agree with the resident's interpretation.      Heriberto AntiguaWatkins-Torry, Manish Ruggiero Marie, MD  Resident  10/12/13 [phone]

## 2013-10-12 NOTE — Discharge Instructions (Signed)
Chest Pain of Unclear Etiology     You have been seen for chest pain. The cause of your pain is not yet known.     Your doctor has learned about your medical history, examined you, and checked any tests that were done. Still, it is unclear why you are having pain. The doctor thinks there is only a very small chance that your pain is caused by a life-threatening condition. Later, your primary care doctor might do more tests or check you again.     Sometimes chest pain is caused by a dangerous condition, like a heart attack, aorta injury, blood clot in the lung, or collapsed lung. It is unlikely that your pain is caused by a life-threatening condition if: Your chest pain lasts only a few seconds at a time; you are not short of breath, nauseated (sick to your stomach), sweaty, or lightheaded; your pain gets worse when you twist or bend; your pain improves with exercise or hard work.     Chest pain is serious. It is VERY IMPORTANT that you follow up with your regular doctor and seek medical attention immediately here or at the nearest Emergency Department if your symptoms become worse or they change.     YOU SHOULD SEEK MEDICAL ATTENTION IMMEDIATELY, EITHER HERE OR AT THE NEAREST EMERGENCY DEPARTMENT, IF ANY OF THE FOLLOWING OCCURS:  · Your pain gets worse.  · Your pain makes you short of breath, nauseated, or sweaty.  · Your pain gets worse when you walk, go up stairs, or exert yourself.  · You feel weak, lightheaded, or faint.  · It hurts to breathe.  · Your leg swells.  · Your symptoms get worse or you have new symptoms or concerns.

## 2013-10-12 NOTE — ED Notes (Signed)
PRN note  2118: moprphine 6mg  iv in 10ml NS given IVP for c/o CP, 8/10

## 2013-10-12 NOTE — ED Notes (Signed)
[facility] reported CP and was transported here by [facility], treated in field per CP protocol, pt reports unprovked 9/10 CP in chest, with SOB  A&Ox3, speech clear, aprop repsonses to questions, MAE, cooperative  Breahting regular unlabored, full sentences, o cough or difficulty noted  Skin warm normal color and moisture, CO CP at 9/10, full moniotor shows NSR  Pt seen by [facility], treated per order, awaiting lab result, xray and further dispo.

## 2013-10-19 NOTE — ED Follow-up Note (Signed)
Follow-up type: Callback       Routine ED Patient Call Back    Patient unable to be contacted, no message left

## 2013-10-25 LAB — ECG 12-LEAD
ATRIAL RATE: 63 {beats}/min
ECG INTERPRETATION: NORMAL
P AXIS: 42 degrees
PR INTERVAL: 130 ms
QRS INTERVAL/DURATION: 92 ms
QT: 380 ms
QTC INTERVAL: 388 ms
R AXIS: 62 degrees
T AXIS: 37 degrees
VENTRICULAR RATE: 63 {beats}/min

## 2014-06-06 ENCOUNTER — Inpatient Hospital Stay
Admit: 2014-06-06 | Discharge: 2014-06-07 | Disposition: A | Discharge status: Home / Self Care | Payer: Self-pay | Attending: Emergency Medicine

## 2014-06-06 DIAGNOSIS — R079 Chest pain, unspecified: Secondary | ICD-10-CM

## 2014-06-06 LAB — TROPONIN I: Troponin-I, Qt.: <0.04 ng/mL (ref <0.05)

## 2014-06-06 LAB — CBC WITH AUTOMATED DIFF
ABS. BASOPHILS: 0 10*3/uL (ref 0.0–0.1)
ABS. EOSINOPHILS: 0.1 10*3/uL (ref 0.0–0.4)
ABS. LYMPHOCYTES: 1.6 10*3/uL (ref 0.8–3.5)
ABS. MONOCYTES: 0.6 10*3/uL (ref 0.0–1.0)
ABS. NEUTROPHILS: 3.7 10*3/uL (ref 1.8–8.0)
BASOPHILS: 0 % (ref 0–1)
EOSINOPHILS: 2 % (ref 0–7)
HCT: 29.9 % — ABNORMAL LOW (ref 36.6–50.3)
HGB: 9.8 g/dL — ABNORMAL LOW (ref 12.1–17.0)
LYMPHOCYTES: 26 % (ref 12–49)
MCH: 24.4 PG — ABNORMAL LOW (ref 26.0–34.0)
MCHC: 32.8 g/dL (ref 30.0–36.5)
MCV: 74.6 FL — ABNORMAL LOW (ref 80.0–99.0)
MONOCYTES: 10 % (ref 5–13)
NEUTROPHILS: 62 % (ref 32–75)
PLATELET: 328 10*3/uL (ref 150–400)
RBC: 4.01 M/uL — ABNORMAL LOW (ref 4.10–5.70)
RDW: 16.9 % — ABNORMAL HIGH (ref 11.5–14.5)
WBC: 6 10*3/uL (ref 4.1–11.1)

## 2014-06-06 LAB — DRUG SCREEN, URINE
AMPHETAMINES: NEGATIVE
BARBITURATES: NEGATIVE
BENZODIAZEPINES: NEGATIVE
COCAINE: NEGATIVE
METHADONE: NEGATIVE
OPIATES: POSITIVE — AB
PCP(PHENCYCLIDINE): NEGATIVE
THC (TH-CANNABINOL): POSITIVE — AB

## 2014-06-06 LAB — METABOLIC PANEL, COMPREHENSIVE
A-G Ratio: 1.3 (ref 1.1–2.2)
ALT (SGPT): 28 U/L (ref 12–78)
AST (SGOT): 18 U/L (ref 15–37)
Albumin: 3.8 g/dL (ref 3.5–5.0)
Alk. phosphatase: 83 U/L (ref 45–117)
Anion gap: 8 mmol/L (ref 5–15)
BUN/Creatinine ratio: 15 (ref 12–20)
BUN: 13 MG/DL (ref 6–20)
Bilirubin, total: 0.1 MG/DL — ABNORMAL LOW (ref 0.2–1.0)
CO2: 28 mmol/L (ref 21–32)
Calcium: 8.1 MG/DL — ABNORMAL LOW (ref 8.5–10.1)
Chloride: 106 mmol/L (ref 97–108)
Creatinine: 0.87 MG/DL (ref 0.70–1.30)
GFR est AA: >60 mL/min/{1.73_m2} (ref >60)
GFR est non-AA: >60 mL/min/{1.73_m2} (ref >60)
Globulin: 2.9 g/dL (ref 2.0–4.0)
Glucose: 119 mg/dL — ABNORMAL HIGH (ref 65–100)
Potassium: 3.7 mmol/L (ref 3.5–5.1)
Protein, total: 6.7 g/dL (ref 6.4–8.2)
Sodium: 142 mmol/L (ref 136–145)

## 2014-06-06 LAB — MAGNESIUM: Magnesium: 2.2 mg/dL (ref 1.6–2.4)

## 2014-06-06 LAB — LIPASE: Lipase: 180 U/L (ref 73–393)

## 2014-06-06 LAB — CK W/ CKMB & INDEX
CK - MB: 2.9 NG/ML (ref 0.5–3.6)
CK-MB Index: 1.1 (ref 0–2.5)
CK: 275 U/L (ref 39–308)

## 2014-06-06 MED ORDER — ONDANSETRON (PF) 4 MG/2 ML INJECTION
4 mg/2 mL | INTRAMUSCULAR | Status: AC
Start: 2014-06-06 — End: 2014-06-06
  Administered 2014-06-06: 22:00:00 via INTRAVENOUS

## 2014-06-06 MED ORDER — SODIUM CHLORIDE 0.9 % IJ SYRG
INTRAMUSCULAR | Status: DC | PRN
Start: 2014-06-06 — End: 2014-06-07

## 2014-06-06 MED ORDER — MORPHINE 4 MG/ML SYRINGE
4 mg/mL | Freq: Once | INTRAMUSCULAR | Status: AC
Start: 2014-06-06 — End: 2014-06-06
  Administered 2014-06-06: 22:00:00 via INTRAVENOUS

## 2014-06-06 MED ORDER — SODIUM CHLORIDE 0.9 % IJ SYRG
Freq: Three times a day (TID) | INTRAMUSCULAR | Status: DC
Start: 2014-06-06 — End: 2014-06-07

## 2014-06-06 MED FILL — MORPHINE 4 MG/ML SYRINGE: 4 mg/mL | INTRAMUSCULAR | Qty: 1

## 2014-06-06 MED FILL — ONDANSETRON (PF) 4 MG/2 ML INJECTION: 4 mg/2 mL | INTRAMUSCULAR | Qty: 2

## 2014-06-06 NOTE — ED Notes (Signed)
Patient given healthy choice meal.

## 2014-06-06 NOTE — ED Notes (Signed)
Patient moved into room 32 from hall 4.  Placed on cardiac monitor x3.  VSS.  Pt playing video game on his cell phone, lights dimmed.  Pt denies any needs.

## 2014-06-06 NOTE — ED Provider Notes (Signed)
HPI Comments: 44 y.o. male with PMHx significant for HTN and MI in 2013 presents via EMS to the ED c/o acute-onset of "crushing" L-sided CP that radiates to left jaw x 1 hour PTA today. Pt reports that he was at [place] today when his pain started. He states that the pharmacist called EMS and he reports taking four ASA 81 mg PTA. Pt is c/o associated nausea and SOB with onset of CP and HA since being administered 1 SL NTG by EMS. Pt denies any relief of pain after taking ASA and NTG; he rates his pain as 8/10 at this time. Pt notes that he was admitted and had cardiac catheterization following his MI in 2013, but he denies hx of cardiac stent placement. He states that he was seen in an ED in [place] about 9 months ago for an episode of CP but he notes that he did not follow up with cardiology for an echocardiogram as advised. Pt denies any recent cough or vomiting. He also denies hx of PE or DVT.     PCP: No primary care provider on file.  Social Hx: -+ tobacco, - EtOH, - illicit drug use (hx of marijuana use)  Family Hx: CAD, MI, stroke, brain aneurysm, AAA     There are no other complaints, changes or physical findings at this time.   Written by Bebe Liter, ED Scribe, as dictated by Alvie Heidelberg, MD      The history is provided by the patient and the EMS personnel.        Past Medical History:   Diagnosis Date   ??? Hypertension    ??? Myocardial infarct [facility]) 2013       History reviewed. No pertinent past surgical history.      History reviewed. No pertinent family history.    History     Social History   ??? Marital Status: N/A     Spouse Name: N/A   ??? Number of Children: N/A   ??? Years of Education: N/A     Occupational History   ??? Not on file.     Social History Main Topics   ??? Smoking status: Never Smoker    ??? Smokeless tobacco: Not on file   ??? Alcohol Use: No   ??? Drug Use: No      Comment: "used to use medical marijuana"   ??? Sexual Activity: Not on file     Other Topics Concern    ??? Not on file     Social History Narrative   ??? No narrative on file           ALLERGIES: Toradol and Tramadol      Review of Systems   Constitutional: Negative.  Negative for fever and chills.   Eyes: Negative.    Respiratory: Positive for shortness of breath. Negative for cough.    Cardiovascular: Positive for chest pain.   Gastrointestinal: Positive for nausea. Negative for vomiting, abdominal pain and diarrhea.   Genitourinary: Negative.    Musculoskeletal: Negative.    Skin: Negative.  Negative for rash.   Neurological: Positive for headaches.   Psychiatric/Behavioral: Negative.    All other systems reviewed and are negative.      Filed Vitals:    06/06/14 1816 06/06/14 1854 06/06/14 1939 06/06/14 2132   BP: 121/58 120/56 113/54 107/52   Pulse: 95 87 89 81   Temp:       Resp: 16 18 22 18    Height:  Weight:       SpO2: 96% 96% 99% 99%            Physical Exam   Constitutional: He appears well-developed and well-nourished. No distress.   HENT:   Head: Normocephalic and atraumatic.   Eyes: Pupils are equal, round, and reactive to light.   Neck: Normal range of motion. Neck supple. No JVD present.   Cardiovascular: Normal rate, regular rhythm, normal heart sounds, intact distal pulses and normal pulses.  PMI is not displaced.  Exam reveals no gallop, no friction rub and no decreased pulses.    No murmur heard.  Pulmonary/Chest: Effort normal and breath sounds normal. No stridor. No respiratory distress. He has no wheezes. He has no rales. He exhibits no tenderness.   Abdominal: Soft. Normal aorta. He exhibits no distension, no abdominal bruit, no pulsatile midline mass and no mass. There is no tenderness. There is no rebound, no guarding and no CVA tenderness. No hernia.   Musculoskeletal: He exhibits no edema.   Neurological: He is alert. He displays normal reflexes. No cranial nerve deficit. He exhibits normal muscle tone.   Skin: Skin is warm. He is not diaphoretic.   Psychiatric: His mood appears anxious.    Nursing note and vitals reviewed.       MDM  Number of Diagnoses or Management Options  Chest pain, unspecified type:   Diagnosis management comments: Diff dx-coronary syndrome,aortic dissection,biliary disease,PUD,drug seeking behavior,atypical chest pain    Imp/plan-patient from [place] and states prior MI developed CP while at [facility], somewhat sketchy in his story. In ER 2 EKGs and enzymes normal seen by cardiology and cleared. Patient states moving to [place] so recommended he contact a cardiologist on arrival there.       Amount and/or Complexity of Data Reviewed  Clinical lab tests: ordered and reviewed  Tests in the radiology section of CPT??: ordered and reviewed  Tests in the medicine section of CPT??: ordered and reviewed  Obtain history from someone other than the patient: yes (EMS)  Discuss the patient with other providers: yes (Cardiology)  Independent visualization of images, tracings, or specimens: yes    Risk of Complications, Morbidity, and/or Mortality  Presenting problems: moderate  Diagnostic procedures: moderate  Management options: moderate    Patient Progress  Patient progress: stable      Procedures    EKG interpretation: (Preliminary)  1740  Rhythm: sinus tachycardia; and regular . Rate (approx.): 101; Axis: normal; P wave: normal; QRS interval: normal; ST/T wave: normal; Other findings: non-ischemic.    EKG interpretation: (Repeat)  1948  Rhythm: normal sinus rhythm; and regular . Rate (approx.): 77; Axis: normal; P wave: normal; QRS interval: normal; ST/T wave: normal; Other findings: non-ischemic.    CONSULT NOTE:   7:49 PM  Alvie Heidelberg, MD spoke with Dr. Evelina Bucy,   Specialty: Cardiology  Discussed pt's hx, disposition, and available diagnostic and imaging results. Reviewed care plans. Consultant agrees with plans as outlined.  He said that he will come see the pt.     Progress Note:  9:21 PM  Dr. Evelina Bucy has seen and evaluated the pt. Pt has been cleared to be  discharged home from a cardiac standpoint.       LABORATORY TESTS:  Recent Results (from the past 12 hour(s))   EKG, 12 LEAD, INITIAL    Collection Time: 06/06/14  5:40 PM   Result Value Ref Range    Ventricular Rate 101 BPM    Atrial Rate  101 BPM    P-R Interval 134 ms    QRS Duration 96 ms    Q-T Interval 342 ms    QTC Calculation (Bezet) 443 ms    Calculated P Axis 63 degrees    Calculated R Axis 52 degrees    Diagnosis Sinus tachycardia  No previous ECGs available      CBC WITH AUTOMATED DIFF    Collection Time: 06/06/14  6:07 PM   Result Value Ref Range    WBC 6.0 4.1 - 11.1 K/uL    RBC 4.01 (L) 4.10 - 5.70 M/uL    HGB 9.8 (L) 12.1 - 17.0 g/dL    HCT 29.9 (L) 36.6 - 50.3 %    MCV 74.6 (L) 80.0 - 99.0 FL    MCH 24.4 (L) 26.0 - 34.0 PG    MCHC 32.8 30.0 - 36.5 g/dL    RDW 16.9 (H) 11.5 - 14.5 %    PLATELET 328 150 - 400 K/uL    NEUTROPHILS 62 32 - 75 %    LYMPHOCYTES 26 12 - 49 %    MONOCYTES 10 5 - 13 %    EOSINOPHILS 2 0 - 7 %    BASOPHILS 0 0 - 1 %    ABS. NEUTROPHILS 3.7 1.8 - 8.0 K/UL    ABS. LYMPHOCYTES 1.6 0.8 - 3.5 K/UL    ABS. MONOCYTES 0.6 0.0 - 1.0 K/UL    ABS. EOSINOPHILS 0.1 0.0 - 0.4 K/UL    ABS. BASOPHILS 0.0 0.0 - 0.1 K/UL   METABOLIC PANEL, COMPREHENSIVE    Collection Time: 06/06/14  6:07 PM   Result Value Ref Range    Sodium 142 136 - 145 mmol/L    Potassium 3.7 3.5 - 5.1 mmol/L    Chloride 106 97 - 108 mmol/L    CO2 28 21 - 32 mmol/L    Anion gap 8 5 - 15 mmol/L    Glucose 119 (H) 65 - 100 mg/dL    BUN 13 6 - 20 MG/DL    Creatinine 0.87 0.70 - 1.30 MG/DL    BUN/Creatinine ratio 15 12 - 20      GFR est AA >60 >60 ml/min/1.77m    GFR est non-AA >60 >60 ml/min/1.727m   Calcium 8.1 (L) 8.5 - 10.1 MG/DL    Bilirubin, total 0.1 (L) 0.2 - 1.0 MG/DL    ALT 28 12 - 78 U/L    AST 18 15 - 37 U/L    Alk. phosphatase 83 45 - 117 U/L    Protein, total 6.7 6.4 - 8.2 g/dL    Albumin 3.8 3.5 - 5.0 g/dL    Globulin 2.9 2.0 - 4.0 g/dL    A-G Ratio 1.3 1.1 - 2.2     LIPASE    Collection Time: 06/06/14  6:07 PM    Result Value Ref Range    Lipase 180 73 - 393 U/L   TROPONIN I    Collection Time: 06/06/14  6:07 PM   Result Value Ref Range    Troponin-I, Qt. <0.04 <0.05 ng/mL   CK W/ CKMB & INDEX    Collection Time: 06/06/14  6:07 PM   Result Value Ref Range    CK 275 39 - 308 U/L    CK - MB 2.9 0.5 - 3.6 NG/ML    CK-MB Index 1.1 0 - 2.5     MAGNESIUM    Collection Time: 06/06/14  6:07 PM   Result Value Ref  Range    Magnesium 2.2 1.6 - 2.4 mg/dL   DRUG SCREEN, URINE    Collection Time: 06/06/14  6:55 PM   Result Value Ref Range    AMPHETAMINE NEGATIVE  NEG      BARBITURATES NEGATIVE  NEG      BENZODIAZEPINE NEGATIVE  NEG      COCAINE NEGATIVE  NEG      METHADONE NEGATIVE  NEG      OPIATES POSITIVE (A) NEG      PCP(PHENCYCLIDINE) NEGATIVE  NEG      THC (TH-CANNABINOL) POSITIVE (A) NEG      Drug screen comment (NOTE)    EKG, 12 LEAD, SUBSEQUENT    Collection Time: 06/06/14  7:48 PM   Result Value Ref Range    Ventricular Rate 77 BPM    Atrial Rate 77 BPM    P-R Interval 136 ms    QRS Duration 104 ms    Q-T Interval 380 ms    QTC Calculation (Bezet) 430 ms    Calculated P Axis 55 degrees    Calculated R Axis 46 degrees    Calculated T Axis 17 degrees    Diagnosis       Normal sinus rhythm  Normal ECG  When compared with ECG of 06-Jun-2014 17:40,  MANUAL COMPARISON REQUIRED, DATA IS UNCONFIRMED     TROPONIN I    Collection Time: 06/06/14  7:49 PM   Result Value Ref Range    Troponin-I, Qt. <0.04 <0.05 ng/mL       IMAGING RESULTS:    XR CHEST PORT (Final result) Result time: 06/06/14 19:01:37   ?? Final result by Rad Results In Edi (06/06/14 19:01:37)   ?? Narrative:   ?? **Final Report**  ??    ICD Codes / Adm.Diagnosis: 259563 ?? / Chest Pain ??  Examination: ??CR CHEST PORT ??- [id number] - Jun 06 2014 ??6:49PM  Accession No: ??[id number]  Reason: ??CP      REPORT:  EXAM: ??CR CHEST PORT    INDICATION: ??CP    COMPARISON: ??None.    FINDINGS: A portable AP radiograph of the chest was obtained at 1845 hours.    The patient is on a cardiac monitor. ??The lungs are clear. ??The cardiac and   mediastinal contours and pulmonary vascularity are normal. ??The bones and   soft tissues are grossly within normal limits.   ????    IMPRESSION: No acute abnormality identified.  ????    ??  ??  Signing/Reading Doctor: Stevan Born [phone]) ??  ApprovedStevan Born [phone]) ??Jun 06 2014 ??6:59PM ?? ?? ?? ?? ?? ?? ?? ?? ?? ?? ??         MEDICATIONS GIVEN:  Medications   sodium chloride (NS) flush 5-10 mL (not administered)   sodium chloride (NS) flush 5-10 mL (not administered)   morphine injection 4 mg (4 mg IntraVENous Given 06/06/14 1809)   ondansetron (ZOFRAN) injection 4 mg (4 mg IntraVENous Given 06/06/14 1809)   HYDROcodone-acetaminophen (NORCO) 5-325 mg per tablet 1 Tab (1 Tab Oral Given 06/06/14 2143)       IMPRESSION:  1. Chest pain, unspecified type        PLAN:  1.   Current Discharge Medication List      CONTINUE these medications which have CHANGED    Details   nitroglycerin (NITROSTAT) 0.4 mg SL tablet Take 1 Tab by mouth every five (5) minutes as needed for Chest Pain. Sit/Lay down then put  one tab under the tongue every 5 minutes as needed for chest pain for 3 doses  Qty: 1 Bottle, Refills: 0         CONTINUE these medications which have NOT CHANGED    Details   lisinopril (PRINIVIL, ZESTRIL) 10 mg tablet Take  by mouth daily.      aspirin (ASPIRIN) 325 mg tablet Take 325 mg by mouth daily.           2.   Follow-up Information     Follow up With Details Comments Contact Info    Cardiology in [place] Call in 1 day          Return to ED if worse       DISCHARGE NOTE  10:00 PM  The patient has been re-evaluated and is ready for discharge. Reviewed available results with patient. Counseled pt on diagnosis and care plan. Pt has expressed understanding, and all questions have been answered. Pt agrees with plan and agrees to F/U as recommended, or return to the ED if their sxs worsen. Discharge instructions have been provided and explained  to the pt, along with reasons to return to the ED.  Written by Bebe Liter, ED Scribe, as dictated by Alvie Heidelberg, MD.      This note is prepared by Bebe Liter, acting as Scribe for Alvie Heidelberg, MD.    Alvie Heidelberg, MD : The scribe's documentation has been prepared under my direction and personally reviewed by me in its entirety. I confirm that the note above accurately reflects all work, treatment, procedures, and medical decision making performed by me.

## 2014-06-06 NOTE — ED Notes (Signed)
Dr. Harrie JeansStratton at bedside updating patient on plan of care.  Dr. Harrie JeansStratton is consulting cardiology.

## 2014-06-06 NOTE — ED Notes (Signed)
Patient resting in bed watching TV.  Denies any needs.

## 2014-06-06 NOTE — ED Notes (Signed)
Dr. Schneider at bedside.

## 2014-06-06 NOTE — ED Notes (Signed)
Repeat EKG and troponin sent to the lab.  Lights dimmed per patient request, pt watching TV.

## 2014-06-06 NOTE — ED Notes (Signed)
Dr. Stratton reviewed discharge instructions with the patient.  The patient verbalized understanding.  Patient ambulatory out of ED with discharge paperwork in hand.

## 2014-06-06 NOTE — ED Notes (Signed)
Assumed care of this patient from EMS.  Pt arrived with c/o severe sudden onset of chest pain that started x1 hour ago while he was walking around [employer]. Pt reported the pain radiates up into the left side of his neck with nausea.  Pt took 324mg  of ASA and was give 1 SL nitro by EMS PTA.  Pt reported he has hx of 1 MI x3 years ago.  EKG sinus tach.  Pt alert and oriented x4.  [place] therefore [place]

## 2014-06-07 LAB — EKG, 12 LEAD, INITIAL
Atrial Rate: 101 {beats}/min
Calculated P Axis: 63 degrees
Calculated R Axis: 52 degrees
P-R Interval: 134 ms
Q-T Interval: 342 ms
QRS Duration: 96 ms
QTC Calculation (Bezet): 443 ms
Ventricular Rate: 101 {beats}/min

## 2014-06-07 LAB — EKG, 12 LEAD, SUBSEQUENT
Atrial Rate: 77 {beats}/min
Calculated P Axis: 55 degrees
Calculated R Axis: 46 degrees
Calculated T Axis: 17 degrees
Diagnosis: NORMAL
P-R Interval: 136 ms
Q-T Interval: 380 ms
QRS Duration: 104 ms
QTC Calculation (Bezet): 430 ms
Ventricular Rate: 77 {beats}/min

## 2014-06-07 LAB — TROPONIN I: Troponin-I, Qt.: <0.04 ng/mL (ref <0.05)

## 2014-06-07 MED ORDER — HYDROCODONE-ACETAMINOPHEN 5 MG-325 MG TAB
5-325 mg | ORAL | Status: AC
Start: 2014-06-07 — End: 2014-06-06
  Administered 2014-06-07: 02:00:00 via ORAL

## 2014-06-07 MED ORDER — NITROGLYCERIN 0.4 MG SUBLINGUAL TAB
0.4 mg | SUBLINGUAL | Status: AC | PRN
Start: 2014-06-07 — End: ?

## 2014-06-07 MED FILL — HYDROCODONE-ACETAMINOPHEN 5 MG-325 MG TAB: 5-325 mg | ORAL | Qty: 1

## 2014-06-07 NOTE — Consults (Signed)
CARDIOLOGY CONSULT    Patient ID:  Patient: Miguel Carr  MRN: [id number]  Age: 44 y.o.  DOB: 1971/02/10    Date of  Admission: 06/06/2014  5:38 PM   PCP:  None    Assessment: 1.   Chest pain syndrome without evidence of ischemic ECG changes or positive cardiac markers.  He tells me SL NTG didn't help him.  I don't have the impression this was an unstable cardiac event.  2. Report of old MI.  Says no intervention taken on prior cath in [place].  3. Hypertension.  4. Tobacco smoker.  5. 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  6. Anemia of unclear etiology.    Plan:     1. I talked at length with him regarding his condition and options for evaluation.  The conversation also included his social situation which included [place] to live with a friend and he brought up a traumatic childhood experience.  I don't think that invasive cardiac testing is indicated at this time.  2. He'll continue ASA.  3. No change to cardiac medications otherwise.  4. Smoking cessation encouraged.  5. He'll need to get a PCP and also a cardiologist in the [place] area.  6. He is aware of signs and sx warranting urgent med F/U and calling 911.             High complexity decision making was performed in this patient at high risk for decompensation with multiple organ involvement.    Miguel Carr is a 44 y.o. male with a history of an old MI diagnosis, but no reported PCI, here with onset of left-sided chest pain today while at [facility] that occurred without provocation.  He was given SL NTG which did not help.  His discomfort radiated to his jaw, was associated with SOB, and he continued to have discomfort even here in the ER (> 1 hr in duration).  At the time of my interview, he is discomfort-free and comfortable.  Does not appear in any distress.  Wants food.    His serial ECG's did not show ischemic changes.  His initial troponin and early follow-up were negative.  Denies syncope, palpitations, orthopnea,  PND, or edema.    Per the ER:  "43 y.o. male with PMHx significant for HTN and MI in 2013 presents via EMS to the ED c/o acute-onset of "crushing" L-sided CP that radiates to left jaw x 1 hour PTA today. Pt reports that he was at [employer] today when his pain started. He states that the pharmacist called EMS and he reports taking four ASA 81 mg PTA. Pt is c/o associated nausea and SOB with onset of CP and HA since being administered 1 SL NTG by EMS. Pt denies any relief of pain after taking ASA and NTG; he rates his pain as 8/10 at this time. Pt notes that he was admitted and had cardiac catheterization following his MI in 2013, but he denies hx of cardiac stent placement. He states that he was seen in an ED in [place] about 9 months ago for an episode of CP but he notes that he did not follow up with cardiology for an echocardiogram as advised. Pt denies any recent cough or vomiting. He also denies hx of PE or DVT."       Past Medical History   Diagnosis Date   ??? Hypertension    ??? Myocardial infarct [facility]) 2013        History  reviewed. No pertinent past surgical history.    History   Substance Use Topics   ??? Smoking status: +Smoker    ??? Smokeless tobacco: Not on file   ??? Alcohol Use: No        History reviewed. No pertinent family history.     Allergies   Allergen Reactions   ??? Toradol [Ketorolac Tromethamine] Rash   ??? Tramadol Rash          Current Facility-Administered Medications   Medication Dose Route Frequency   ??? sodium chloride (NS) flush 5-10 mL  5-10 mL IntraVENous Q8H   ??? sodium chloride (NS) flush 5-10 mL  5-10 mL IntraVENous PRN     Current Outpatient Prescriptions   Medication Sig   ??? lisinopril (PRINIVIL, ZESTRIL) 10 mg tablet Take  by mouth daily.   ??? aspirin (ASPIRIN) 325 mg tablet Take 325 mg by mouth daily.   ??? nitroglycerin (NITROSTAT) 0.4 mg SL tablet Take 1 Tab by mouth every five (5) minutes as needed for Chest Pain. Sit/Lay down then put one tab  under the tongue every 5 minutes as needed for chest pain for 3 doses       Review of Symptoms:  See HPI as well.  General: negative for fever, chills, sweats, weight loss   Eyes: negative for loss of vision, diplopia   Ear Nose and Throat: negative for speech or swallowing difficulties   Respiratory: negative for cough, sputum production, wheezing, DOE, pleuritic pain   Cardiology: negative for chest pain, palpitations, orthopnea, PND, edema, syncope   Gastrointestinal: negative for abdominal pain, N/V, dysphagia, change in bowel habits, bleeding   Genitourinary: negative for frequency, urgency, dysuria, hematuria, incontinence   Muskuloskeletal : negative for arthralgia, myalgia   Hematology: negative for easy bruising, bleeding, lymphadenopathy   Dermatological: negative for rash, ulceration, mole change, new lesion   Endocrine: negative for hot flashes or polydipsia   Neurological: negative for headache, dizziness, confusion, focal weakness, paresthesia, memory loss, gait disturbance   Psychological: negative for anxiety, depression, agitation       Objective:      Physical Exam:  Temp (24hrs), Avg:98.1 ??F (36.7 ??C), Min:98.1 ??F (36.7 ??C), Max:98.1 ??F (36.7 ??C)    Patient Vitals for the past 8 hrs:   Pulse   06/06/14 2132 81   06/06/14 1939 89   06/06/14 1854 87   06/06/14 1816 95   06/06/14 1744 (!) 109    Patient Vitals for the past 8 hrs:   Resp   06/06/14 2132 18   06/06/14 1939 22   06/06/14 1854 18   06/06/14 1816 16   06/06/14 1744 20    Patient Vitals for the past 8 hrs:   BP   06/06/14 2132 107/52 mmHg   06/06/14 1939 113/54 mmHg   06/06/14 1854 120/56 mmHg   06/06/14 1816 121/58 mmHg   06/06/14 1744 142/80 mmHg      No intake or output data in the 24 hours ending 06/07/14 0001    Nondiaphoretic, not in acute distress.  Supple, no palpable thyromegaly.  No scleral icterus, mucous membranes moist, conjuctivae pink, no xanthelasma.  Unlabored, clear to auscultation bilaterally, symmetric air movement.   Regular rate and rhythm, no murmur, pericardial rub, knock, or gallop.  No JVD or peripheral edema.  No carotid bruit.  Palpable radial and DP/PT pulses bilaterally.  Abdomen, soft, nontender, nondistended.  No abdominal bruit or pulstatile masses.  Extremities without cyanosis or clubbing.  Muscle  tone and bulk normal.  Skin warm and dry.  No rashes or ulcers.  Neuro grossly nonfocal.  No tremor.  Awake and appropriate.    CARDIOGRAPHICS and STUDIES, I reviewed:    Telemetry:  SR.    ECG:  SR, no acute ischemia or infarct.       Labs:  Recent Labs      06/06/14   1949  06/06/14   1807   CPK   --   275   CKMB   --   2.9   CKNDX   --   1.1   TROIQ  <0.04  <0.04     No results found for: CHOL, CHOLX, CHLST, CHOLV, HDL, LDL, DLDL, LDLC, DLDLP, TGL, TGLX, TRIGL, TRIGP, CHHD, CHHDX  No results for input(s): INR, PTP, APTT in the last 72 hours.    Invalid input(s): INREXT   Recent Labs      06/06/14   1807   NA  142   K  3.7   CL  106   CO2  28   BUN  13   CREA  0.87   GLU  119*   CA  8.1*   ALB  3.8   WBC  6.0   HGB  9.8*   HCT  29.9*   PLT  328     Recent Labs      06/06/14   1807   SGOT  18   AP  83   TP  6.7   ALB  3.8   GLOB  2.9   LPSE  180     No components found for: GLPOC  No results for input(s): PH, PCO2, PO2 in the last 72 hours.        Butch PennyANIEL A Shuntay Everetts, MD  06/07/2014

## 2014-06-15 ENCOUNTER — Observation Stay (HOSPITAL_COMMUNITY)
Admission: EM | Admit: 2014-06-15 | Discharge: 2014-06-16 | Discharge status: Against Medical Advice | Payer: Medicaid - Out of State | Attending: Family Medicine | Admitting: Family Medicine

## 2014-06-15 ENCOUNTER — Emergency Department (HOSPITAL_COMMUNITY): Payer: Medicaid - Out of State

## 2014-06-15 ENCOUNTER — Encounter (HOSPITAL_COMMUNITY): Payer: Self-pay

## 2014-06-15 DIAGNOSIS — I252 Old myocardial infarction: Secondary | ICD-10-CM | POA: Diagnosis not present

## 2014-06-15 DIAGNOSIS — Z720 Tobacco use: Secondary | ICD-10-CM | POA: Diagnosis not present

## 2014-06-15 DIAGNOSIS — Z7982 Long term (current) use of aspirin: Secondary | ICD-10-CM | POA: Diagnosis not present

## 2014-06-15 DIAGNOSIS — I25119 Atherosclerotic heart disease of native coronary artery with unspecified angina pectoris: Secondary | ICD-10-CM

## 2014-06-15 DIAGNOSIS — I10 Essential (primary) hypertension: Secondary | ICD-10-CM | POA: Diagnosis not present

## 2014-06-15 DIAGNOSIS — R079 Chest pain, unspecified: Secondary | ICD-10-CM | POA: Diagnosis present

## 2014-06-15 DIAGNOSIS — Z87828 Personal history of other (healed) physical injury and trauma: Secondary | ICD-10-CM | POA: Diagnosis not present

## 2014-06-15 DIAGNOSIS — D649 Anemia, unspecified: Secondary | ICD-10-CM | POA: Diagnosis not present

## 2014-06-15 DIAGNOSIS — I2510 Atherosclerotic heart disease of native coronary artery without angina pectoris: Secondary | ICD-10-CM | POA: Diagnosis present

## 2014-06-15 DIAGNOSIS — Z79899 Other long term (current) drug therapy: Secondary | ICD-10-CM | POA: Diagnosis not present

## 2014-06-15 HISTORY — DX: Anemia, unspecified: D64.9

## 2014-06-15 HISTORY — DX: Acute myocardial infarction, unspecified: I21.9

## 2014-06-15 HISTORY — DX: Essential (primary) hypertension: I10

## 2014-06-15 LAB — RAPID URINE DRUG SCREEN, HOSP PERFORMED
Amphetamines: NOT DETECTED
BARBITURATES: NOT DETECTED
Benzodiazepines: NOT DETECTED
Cocaine: NOT DETECTED
Opiates: POSITIVE — AB
Tetrahydrocannabinol: POSITIVE — AB

## 2014-06-15 LAB — I-STAT CHEM 8, ED
BUN: 14 mg/dL (ref 6–23)
CALCIUM ION: 1.14 mmol/L (ref 1.12–1.23)
CREATININE: 0.7 mg/dL (ref 0.50–1.35)
Chloride: 105 mmol/L (ref 96–112)
GLUCOSE: 105 mg/dL — AB (ref 70–99)
HEMATOCRIT: 33 % — AB (ref 39.0–52.0)
Hemoglobin: 11.2 g/dL — ABNORMAL LOW (ref 13.0–17.0)
Potassium: 3.8 mmol/L (ref 3.5–5.1)
Sodium: 140 mmol/L (ref 135–145)
TCO2: 19 mmol/L (ref 0–100)

## 2014-06-15 LAB — PROTIME-INR
INR: 1.08 (ref 0.00–1.49)
Prothrombin Time: 14.1 seconds (ref 11.6–15.2)

## 2014-06-15 LAB — I-STAT TROPONIN, ED: Troponin i, poc: 0 ng/mL (ref 0.00–0.08)

## 2014-06-15 LAB — APTT: aPTT: 33 seconds (ref 24–37)

## 2014-06-15 LAB — CBC
HEMATOCRIT: 28.5 % — AB (ref 39.0–52.0)
HEMOGLOBIN: 8.9 g/dL — AB (ref 13.0–17.0)
MCH: 23.5 pg — ABNORMAL LOW (ref 26.0–34.0)
MCHC: 31.2 g/dL (ref 30.0–36.0)
MCV: 75.4 fL — ABNORMAL LOW (ref 78.0–100.0)
Platelets: 322 10*3/uL (ref 150–400)
RBC: 3.78 MIL/uL — ABNORMAL LOW (ref 4.22–5.81)
RDW: 16.7 % — ABNORMAL HIGH (ref 11.5–15.5)
WBC: 5.3 10*3/uL (ref 4.0–10.5)

## 2014-06-15 LAB — TROPONIN I: Troponin I: <0.03 ng/mL (ref <0.031)

## 2014-06-15 LAB — TSH: TSH: 0.42 u[IU]/mL (ref 0.350–4.500)

## 2014-06-15 MED ORDER — ASPIRIN EC 81 MG PO TBEC: 81.0000 mg | DELAYED_RELEASE_TABLET | Freq: Every day | ORAL | Status: DC

## 2014-06-15 MED ORDER — LISINOPRIL 10 MG PO TABS
10.0000 mg | ORAL_TABLET | Freq: Every day | ORAL | Status: DC
  Filled 2014-06-15: qty 1

## 2014-06-15 MED ORDER — NITROGLYCERIN 0.4 MG SL SUBL: 0.4000 mg | SUBLINGUAL_TABLET | SUBLINGUAL | Status: DC | PRN

## 2014-06-15 MED ORDER — FERROUS SULFATE 325 (65 FE) MG PO TABS
325.0000 mg | ORAL_TABLET | Freq: Every day | ORAL | Status: DC
  Filled 2014-06-15: qty 1

## 2014-06-15 MED ORDER — ACETAMINOPHEN 325 MG PO TABS: 650.0000 mg | ORAL_TABLET | Freq: Four times a day (QID) | ORAL | Status: DC | PRN

## 2014-06-15 MED ORDER — MORPHINE SULFATE 4 MG/ML IJ SOLN
4.0000 mg | Freq: Once | INTRAMUSCULAR | Status: AC
  Administered 2014-06-15: 4 mg via INTRAVENOUS
  Filled 2014-06-15: qty 1

## 2014-06-15 MED ORDER — MORPHINE SULFATE 2 MG/ML IJ SOLN
1.0000 mg | INTRAMUSCULAR | Status: DC | PRN
  Administered 2014-06-15 – 2014-06-16 (×4): 1 mg via INTRAVENOUS
  Filled 2014-06-15 (×4): qty 1

## 2014-06-15 MED ORDER — ACTIVE PARTNERSHIP FOR HEALTH OF YOUR HEART BOOK
Freq: Once | Status: AC
  Administered 2014-06-15: 22:00:00
  Filled 2014-06-15: qty 1

## 2014-06-15 MED ORDER — HEPARIN SODIUM (PORCINE) 5000 UNIT/ML IJ SOLN
5000.0000 [IU] | Freq: Three times a day (TID) | INTRAMUSCULAR | Status: DC
  Administered 2014-06-15 (×2): 5000 [IU] via SUBCUTANEOUS
  Filled 2014-06-15 (×3): qty 1

## 2014-06-15 MED ORDER — SODIUM CHLORIDE 0.9 % IJ SOLN
3.0000 mL | Freq: Two times a day (BID) | INTRAMUSCULAR | Status: DC
  Administered 2014-06-15 (×2): 3 mL via INTRAVENOUS

## 2014-06-15 MED ORDER — HYDROCODONE-ACETAMINOPHEN 5-325 MG PO TABS
1.0000 | ORAL_TABLET | ORAL | Status: DC | PRN
  Administered 2014-06-15: 2 via ORAL
  Administered 2014-06-15: 1 via ORAL
  Filled 2014-06-15: qty 1
  Filled 2014-06-15: qty 2

## 2014-06-15 MED ORDER — ASPIRIN 81 MG PO CHEW
324.0000 mg | CHEWABLE_TABLET | Freq: Once | ORAL | Status: DC
  Filled 2014-06-15: qty 4

## 2014-06-15 MED ORDER — ACETAMINOPHEN 650 MG RE SUPP: 650.0000 mg | Freq: Four times a day (QID) | RECTAL | Status: DC | PRN

## 2014-06-15 NOTE — Progress Notes (Signed)
Pt does not want to take Vicodin only wants IV medication reports he was in [place] ago and takes Lortab for chronic back pain and Vicodin will not help. Pt UA (+) for recreational drug. Pt lying quietly in bed watching TV until nurse come into room and pt has calculated next IV pain med dose. Also wanted to know how many mg dose prescribed
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
## 2014-06-15 NOTE — ED Notes (Signed)
Pt presents with mid-sternal chest pain [place]  Pt took NTG x 2 and was given 1 by EMS with relief of pain from 10 to a 9.  +shortness of breath, +diaphoresis and nausea.

## 2014-06-16 DIAGNOSIS — R0789 Other chest pain: Secondary | ICD-10-CM

## 2014-06-16 LAB — HEMOGLOBIN A1C
HEMOGLOBIN A1C: 5.8 % — AB (ref 4.8–5.6)
MEAN PLASMA GLUCOSE: 120 mg/dL

## 2014-06-16 NOTE — Progress Notes (Signed)
Pt requesting to leave AMA, stating "bring me my AMA paper so I can leave."  He is fully dressed in his street clothes with IV out and lying on the counter in pt room.  Demanding for nursing staff to retrieve his belongings from security and pharmacy.  Dr. Mahala MenghiniSamtani notified.  Medication from pharmacy and belongings from security returned to patient.  Pt walked himself out.  Alonza Bogusuvall, Tramar Brueckner Gray

## 2014-06-16 NOTE — Progress Notes (Signed)
Pt continues to refuse am labs.  Alonza Bogusuvall, Jabar Krysiak Gray

## 2014-06-16 NOTE — Progress Notes (Signed)
Pt informed by charge nurse that we can no longer administer narcotics to him if he cannot comply with hospital policy to store his home meds in pharmacy, due to patient safety concerns.  Pt agrees to have the charge nurse take his lortab to be stored in pharmacy.  Alonza Bogusuvall, Dominick Zertuche Gray

## 2014-06-16 NOTE — Discharge Summary (Signed)
Physician Discharge Summary  Brad Lopez AOZ:[id number] DOB: 1970/05/28 DOA: 06/15/2014  PCP: No PCP Per Patient  Admit date: 06/15/2014 Discharge date: 06/16/2014  Time spent: 5 minutes minutes  Recommendations for Outpatient Follow-up:  1. None patient left AGAINST MEDICAL ADVICE   Discharge Diagnoses:  Principal Problem:   Chest pain Active Problems:   CAD (coronary artery disease)   Benign essential HTN   Discharge Condition: Guarded  Diet recommendation: Floreen Comber Weights   06/15/14 1222 06/16/14 0533  Weight: 88.451 kg (195 lb) 88.361 kg (194 lb 12.8 oz)    History of present illness:  44 year old male with CAD and hypertension admitted with chest pain. He had substernal chest pain and pressure 10 on 10 intensity. He was observed as ACS but left her to me seeing him as he did not 1 his stay and did not want a comply with medical therapies or recommendations in this above labs. Nursing did discuss with the patient high risk of leaving and death demise etc. Etc. I did not see the patient prior to discharge    Discharge Exam: Filed Vitals:   06/16/14 0533  BP: 106/66  Pulse: 69  Temp: 98.2 F (36.8 C)  Resp:      Discharge Instructions  I gave no discharge instructions-he left AGAINST MEDICAL ADVICE  Discharge Medication List as of 06/16/2014  8:16 AM    CONTINUE these medications which have NOT CHANGED   Details  aspirin 81 MG tablet Take 81 mg by mouth daily., Until Discontinued, Historical Med    ferrous sulfate 325 (65 FE) MG tablet Take 325 mg by mouth daily with breakfast., Until Discontinued, Historical Med    lisinopril (PRINIVIL,ZESTRIL) 10 MG tablet Take 10 mg by mouth daily., Until Discontinued, Historical Med    nitroGLYCERIN (NITROSTAT) 0.4 MG SL tablet Place 0.4 mg under the tongue every 5 (five) minutes as needed for chest pain., Until Discontinued, Historical Med       Allergies  Allergen Reactions  . Tape Hives    Clear plastic  tape  . Toradol [Ketorolac Tromethamine] Hives  . Tramadol Hives      The results of significant diagnostics from this hospitalization (including imaging, microbiology, ancillary and laboratory) are listed below for reference.    Significant Diagnostic Studies: Dg Chest Portable 1 View  06/15/2014   CLINICAL DATA:  Left-sided chest pain.  Shortness of breath.  EXAM: PORTABLE CHEST - 1 VIEW  COMPARISON:  None.  FINDINGS: Heart size and pulmonary vascularity are normal. There is calcification in the thoracic aorta.  Lungs are clear.  No effusions.  No significant osseous abnormality.  IMPRESSION: No acute abnormalities there   Electronically Signed   By: Francene Boyers M.D.   On: 06/15/2014 13:30    Microbiology: No results found for this or any previous visit (from the past 240 hour(s)).   Labs: Basic Metabolic Panel:  Recent Labs Lab 06/15/14 1312  NA 140  K 3.8  CL 105  GLUCOSE 105*  BUN 14  CREATININE 0.70   Liver Function Tests: No results for input(s): AST, ALT, ALKPHOS, BILITOT, PROT, ALBUMIN in the last 168 hours. No results for input(s): LIPASE, AMYLASE in the last 168 hours. No results for input(s): AMMONIA in the last 168 hours. CBC:  Recent Labs Lab 06/15/14 1305 06/15/14 1312  WBC 5.3  --   HGB 8.9* 11.2*  HCT 28.5* 33.0*  MCV 75.4*  --   PLT 322  --    Cardiac  Enzymes:  Recent Labs Lab 06/15/14 1610 06/15/14 2148  TROPONINI <0.03 <0.03   BNP: BNP (last 3 results) No results for input(s): BNP in the last 8760 hours.  ProBNP (last 3 results) No results for input(s): PROBNP in the last 8760 hours.  CBG: No results for input(s): GLUCAP in the last 168 hours.     SignedRhetta Mura:  Brad Lopez, Brad Lopez  [facility] 06/16/2014, 6:57 PM

## 2014-06-16 NOTE — Progress Notes (Signed)
Vicodin 2 tabs po given for pt c/o "crushing chest pain" per PRN order.  Pt asks this RN to give him his backpack.  Pt pulls out a pill bottle with 2 pills to show this RN that he takes "lortab" at home, and these pills look different than what he has been taking.  Explained to pt that he is receiving the same drugs (hydrocodone and acetaminophen) as prescribed at home.  Pt also informed of hospital policy that home meds cannot be kept in pt room and must be stored in pharmacy.  Pt adamantly states "no one is taking my medicine; my medicine will stay with me."  Charge nurse and nursing supervisor informed of pt's unwillingness to comply with hospital policy.  Charge nurse in to see pt.  Alonza Bogusuvall, Jessicia Napolitano Gray

## 2014-06-16 NOTE — Progress Notes (Signed)
Pt refusing am lab draw, stating he "has already been stuck enough."  Labs rescheduled; will reattempt lab draw at 0700.  Brad Lopez, Brad Lopez

## 2015-04-30 ENCOUNTER — Emergency Department (EMERGENCY_DEPARTMENT_HOSPITAL): Payer: Medicaid Other

## 2015-04-30 ENCOUNTER — Inpatient Hospital Stay
Admission: EM | Admit: 2015-04-30 | Discharge: 2015-05-01 | DRG: 313 | Discharge status: Against Medical Advice | Payer: Medicaid Other | Attending: Emergency Medicine | Admitting: Emergency Medicine

## 2015-04-30 DIAGNOSIS — F17210 Nicotine dependence, cigarettes, uncomplicated: Secondary | ICD-10-CM | POA: Diagnosis present

## 2015-04-30 DIAGNOSIS — R072 Precordial pain: Principal | ICD-10-CM | POA: Diagnosis present

## 2015-04-30 DIAGNOSIS — R0789 Other chest pain: Secondary | ICD-10-CM

## 2015-04-30 DIAGNOSIS — I252 Old myocardial infarction: Secondary | ICD-10-CM | POA: Insufficient documentation

## 2015-04-30 DIAGNOSIS — I10 Essential (primary) hypertension: Secondary | ICD-10-CM | POA: Diagnosis present

## 2015-04-30 DIAGNOSIS — F17200 Nicotine dependence, unspecified, uncomplicated: Secondary | ICD-10-CM

## 2015-04-30 DIAGNOSIS — R079 Chest pain, unspecified: Secondary | ICD-10-CM | POA: Insufficient documentation

## 2015-04-30 DIAGNOSIS — F1210 Cannabis abuse, uncomplicated: Secondary | ICD-10-CM | POA: Diagnosis present

## 2015-04-30 DIAGNOSIS — E785 Hyperlipidemia, unspecified: Secondary | ICD-10-CM | POA: Diagnosis present

## 2015-04-30 LAB — CBC WITH DIFFERENTIAL
BASOPHILS % AUTO: 1.3 %
BASOPHILS ABS AUTO: 0.1 10*3/uL (ref 0–0.2)
EOSINOPHIL % AUTO: 2.9 %
EOSINOPHIL ABS AUTO: 0.2 10*3/uL (ref 0–0.5)
HEMATOCRIT: 34.9 % — AB (ref 40–52)
HEMOGLOBIN: 11 g/dL — AB (ref 14.0–18.0)
LYMPHOCYTE ABS AUTO: 1.7 10*3/uL (ref 1.0–4.8)
LYMPHOCYTES % AUTO: 25.9 %
MCH: 22.1 pg — AB (ref 27–33)
MCHC: 31.6 % — AB (ref 32–36)
MCV: 70.1 UM3 — AB (ref 80–100)
MONOCYTES % AUTO: 10.4 %
MONOCYTES ABS AUTO: 0.7 10*3/uL (ref 0.1–0.8)
MPV: 7.3 UM3 (ref 6.8–10.0)
NEUTROPHIL ABS AUTO: 3.8 10*3/uL (ref 1.80–7.70)
NEUTROPHILS % AUTO: 59.5 %
PLATELET COUNT: 348 10*3/uL (ref 130–400)
RDW: 19.2 U — AB (ref 0–14.7)
RED CELL COUNT: 4.98 10*6/uL (ref 4.5–5.9)
WHITE BLOOD CELL COUNT: 6.4 10*3/uL (ref 4.5–11.0)

## 2015-04-30 LAB — BASIC METABOLIC PANEL
CALCIUM: 8.8 mg/dL (ref 8.6–10.5)
CARBON DIOXIDE TOTAL: 19 meq/L — AB (ref 24–32)
CHLORIDE: 105 meq/L (ref 95–110)
CREATININE BLOOD: 0.75 mg/dL (ref 0.44–1.27)
GLUCOSE: 91 mg/dL (ref 70–99)
POTASSIUM: 3.7 meq/L (ref 3.3–5.0)
SODIUM: 137 meq/L (ref 135–145)
UREA NITROGEN, BLOOD (BUN): 10 mg/dL (ref 8–22)

## 2015-04-30 LAB — B-TYPE NATRIURETIC PEPTIDE: B-TYPE NATRIURETIC PEPTIDE: 35 pg/mL (ref 0–100)

## 2015-04-30 LAB — TROPONIN I

## 2015-04-30 LAB — MAGNESIUM (MG): MAGNESIUM (MG): 2.1 mg/dL (ref 1.5–2.6)

## 2015-04-30 MED ORDER — MORPHINE 2 MG/ML INJECTION SYRINGE
4.0000 mg | INJECTION | Freq: Once | INTRAMUSCULAR | Status: DC
Start: 2015-04-30 — End: 2015-04-30

## 2015-04-30 MED ORDER — MORPHINE 2 MG/ML INJECTION SYRINGE
4.0000 mg | INJECTION | Freq: Once | INTRAMUSCULAR | Status: AC
Start: 2015-04-30 — End: 2015-04-30
  Administered 2015-04-30: 4 mg via INTRAVENOUS
  Filled 2015-04-30: qty 1

## 2015-04-30 NOTE — ED Nursing Note (Signed)
Report received from Annie, RN and care assumed.

## 2015-04-30 NOTE — ED Nursing Note (Signed)
Pt given pain medication as per orders. Pt assisted with use of phone. Warm blankets and pillows provided. Primary RN notified.

## 2015-04-30 NOTE — ED Nursing Note (Addendum)
Break relief RN note: Report received from Nicole Cella, RN for 1 hour lunch break. Patient asleep, no signs of any distress.

## 2015-04-30 NOTE — ED Nursing Note (Signed)
EKG completed and given to MD Chenoweth.

## 2015-04-30 NOTE — ED Initial Note (Signed)
EMERGENCY DEPARTMENT PHYSICIAN NOTE - Clancy Mullarkey       Date of Service:   04/30/2015  5:07 PM Patient's PCP: No Pcp No Pcp   Note Started: 04/30/2015 17:13 DOB: 02/01/1971             Chief Complaint   Patient presents with    Chest Pain Resolved       The history provided by the patient and EMS personnel.  Interpreter used: No    Nicholas Krueger is a 45yr old male, with a past medical history significant for MI, who presents to the ED by EMS with a chief complaint of chest pain that began PTA. Patient was outside the movie theater when he had a sudden onset of chest pain of severity 8/10. It felt like a squeeze. 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 Patient also complaining of shortness of breath, nausea and dizziness. EMS gave him Nitro which he usually takes at home and it did not help. He was then given Fentanyl that helped with his pain. The chest pain is now at a 4/10.    Quality: pressure  Location: substernal chest  Severity: 8/10  Radiation: to the left arm and neck  Time Course: constant  Progression: gradually improving  Duration: 1 hour  Palliative factors: fentanyl makes it better.  Provocative factors: nothing makes it worse.  Associated with shortness of breath, nausea, and dizziness      A full history, including pertinent past medical and social history was reviewed.    HISTORY:  1    *Chest pain, unspecified type      Chest pain     Allergies   Allergen Reactions    Toradol [Ketorolac Tromethamine] Nausea/Vomiting    Tramadol Nausea/Vomiting      Past Medical History:    Chronic pain                                                  Hypoglycemia                                               Past Surgical History:    ------------other-------------                                 ------------other-------------                                Social History    Marital status: DIVORCED            Spouse name:                       Years of education:                  Number of children:               Occupational History    None on file    Social History Main Topics    Smoking status: Current Every Day Smoker  Packs/day: 0.00      Years: 0.00           Smokeless status: Not on file                       Alcohol use: Not on file     Drug use: Yes                Special: Marijuana    Sexual activity: Not on file          Other Topics            Concern    None on file    Social History Narrative    None on file     No family history on file.         Review of Systems   Constitutional: Negative for chills and fever.   HENT: Negative for congestion and sore throat.    Eyes: Negative for visual disturbance.   Respiratory: Positive for shortness of breath.    Cardiovascular: Positive for chest pain.   Gastrointestinal: Positive for nausea.   Genitourinary: Negative for dysuria and urgency.   Neurological: Positive for dizziness.   Psychiatric/Behavioral: Negative for agitation and confusion.       TRIAGE VITAL SIGNS:  Temp: 36.8 C (98.2 F) (04/30/15 1705)  Temp src: Oral (04/30/15 1705)  Pulse: 94 (04/30/15 1705)  BP: 135/76 (04/30/15 1705)  Resp: 16 (04/30/15 1705)  SpO2: 96 % (04/30/15 1705)  Weight: 86.2 kg (190 lb) (04/30/15 1705)    Physical Exam   Constitutional: He is oriented to person, place, and time. He appears well-developed and well-nourished.   HENT:   Head: Normocephalic and atraumatic.   Eyes: Conjunctivae and EOM are normal. Pupils are equal, round, and reactive to light.   Neck: Normal range of motion. Neck supple. No JVD present.   Cardiovascular: Normal rate, regular rhythm and normal heart sounds.  Exam reveals no gallop and no friction rub.    No murmur heard.  Pulmonary/Chest: Effort normal and breath sounds normal. No respiratory distress. He has no wheezes. He has no rales.   Abdominal: Soft. Bowel sounds are normal. There is no tenderness.   Musculoskeletal: Normal range of motion.    Neurological: He is alert and oriented to person, place, and time.   Skin: Skin is warm and dry.   Psychiatric: He has a normal mood and affect. His behavior is normal.   Nursing note and vitals reviewed.      INITIAL ASSESSMENT & PLAN, MEDICAL DECISION MAKING, ED COURSE  Nicholas Krueger is a 45yr male who presents with a chief complaint of chest pain.     Differential includes, but is not limited to: ACS, angina, unstable angina, stable angina, PE, PTX      The results of the ED evaluation were notable for the following:    Pertinent lab results:   CBC: Hgb 11.0, Hct 34.9  BMP: CO2 19  MG: 2.1  BNP: 35  Trop: <0.01  Trop (1): <0.01    Pertinent imaging results (reviewed and interpreted independently by me):   CHEST 1 VIEW:  No e/o dissection, PTX, or focal consolidation    Radiology reads:   CHEST 1 VIEW:  pending    EKG (reviewed and interpreted independently by me): The rhythm is NSR, rate 81, axis normal, no ST/T changes, unchanged from previous tracings.      Chart Review: I reviewed the patient's prior medical  records. Pertinent information that is relevant to this encounter: previously seen here by CCU for similar complaints. Left AMA x 2 without full workup.      Patient Summary: 45 yo M w/ PMHx of HTN, HLD who p/w acute onset chest pressure. History concerning for typical angina/ACS. No ekg changes concerning for STEMI. Troponin negative x 2. Patient has had cardiac catheterization in July of 2015 however has not had any risk stratification since then. Was evaluated by cardiology who did not feel that he required repeat cardiac cath at this time but that he was a good candidate for risk stratification.  GIven typical symptoms and risk factors patient merits further testing. Patient placed on observation for CPER in AM.  Signed out to oncoming team pending CPER eval.      LAST VITAL SIGNS:  Temp: 36 C (96.8 F) (04/30/15 2101)  Temp src: Oral (04/30/15 2101)  Pulse: 86 (04/30/15 2101)  BP: 109/72 (04/30/15  2101)  Resp: 13 (04/30/15 2101)  SpO2: 97 % (04/30/15 2101)  Weight: 86.2 kg (190 lb) (04/30/15 1705)      Clinical Impression:     ICD-10-CM    1. Chest pain, unspecified type R07.9 ADMIT TO INPATIENT     ADMIT TO INPATIENT       Disposition: Observation    PATIENT'S GENERAL CONDITION:  Fair: Vital signs are stable and within normal limits. Patient is conscious but may be uncomfortable. Indicators are favorable.    SCRIBE STATEMENT  I, Reita Chard, SCRIBE, am personally taking down the notes in the presence of Dr. Dulce Sellar.  Electronically signed by - Reita Chard, SCRIBE, Scribe  04/30/2015  17:13    SCRIBE DISCLAIMER   I, Ernesto Rutherford, personally performed the services described in this documentation, as scribed by the trained medical scribe above in my presence, and it is both accurate and complete.     Electronically signed by: Ernesto Rutherford, Resident      This patient was seen, evaluated, and care plan was developed with the resident.  I agree with the findings and plan as outlined in our combined note. I personally independently visualized the images and tracings as noted above.      Waynette Buttery, MD      Electronically signed by: Waynette Buttery, MD, Attending Physician

## 2015-04-30 NOTE — ED Nursing Note (Addendum)
Pt on 2L of NC per request, pt states the O2 helps the dizziness. O2 sats 98-99% on room air. Pt standing outside movie theater and had sudden sharp chest pain "like something was trying to come out of my chest like in the movie [place]", EMS was called--given nitro/aspirin/zofran/fentanyl for chest pain. Chest pain relieved by fentanyl. Rating 4/10 currently along L side of chest that radiates up neck to L jaw. Reports dizziness and nausea. Hx of MI 4 years ago and TIA with TPA treatment 3 months ago.

## 2015-04-30 NOTE — ED Nursing Note (Signed)
Report given to katie rn.

## 2015-04-30 NOTE — ED Nursing Note (Signed)
Assumed patient care. Report received from [facility].

## 2015-04-30 NOTE — ED Nursing Note (Signed)
Cardiology MD at bedside to eval patient

## 2015-04-30 NOTE — ED Nursing Note (Signed)
Report given to Tommy, RN and care transferred.

## 2015-04-30 NOTE — ED Nursing Note (Signed)
Ok for patient to eat per MD Wallace Keller.  Sandwich and milk provided to patient.

## 2015-05-01 NOTE — ED Nursing Note (Signed)
Pt eloped, IV out, MD paged, Team Leader aware.

## 2015-05-01 NOTE — ED Nursing Note (Signed)
MD paged regarding pt's wish to leave AMA, risks explained to pt, pt states understanding and proceeds to want to leave.  IV taken out, Team Leader notified.

## 2015-05-02 ENCOUNTER — Ambulatory Visit
Admission: EM | Admit: 2015-05-02 | Discharge: 2015-05-03 | DRG: 313 | Discharge status: Against Medical Advice | Payer: Medicaid Other | Attending: Emergency Medicine | Admitting: Emergency Medicine

## 2015-05-02 DIAGNOSIS — I252 Old myocardial infarction: Secondary | ICD-10-CM | POA: Insufficient documentation

## 2015-05-02 DIAGNOSIS — E785 Hyperlipidemia, unspecified: Secondary | ICD-10-CM | POA: Insufficient documentation

## 2015-05-02 DIAGNOSIS — R079 Chest pain, unspecified: Secondary | ICD-10-CM | POA: Insufficient documentation

## 2015-05-02 DIAGNOSIS — G8929 Other chronic pain: Secondary | ICD-10-CM | POA: Insufficient documentation

## 2015-05-02 DIAGNOSIS — Z7983 Long term (current) use of bisphosphonates: Secondary | ICD-10-CM

## 2015-05-02 DIAGNOSIS — F17210 Nicotine dependence, cigarettes, uncomplicated: Secondary | ICD-10-CM | POA: Insufficient documentation

## 2015-05-02 DIAGNOSIS — I10 Essential (primary) hypertension: Secondary | ICD-10-CM | POA: Insufficient documentation

## 2015-05-02 LAB — BASIC METABOLIC PANEL
CALCIUM: 8.8 mg/dL (ref 8.6–10.5)
CARBON DIOXIDE TOTAL: 22 meq/L — AB (ref 24–32)
CHLORIDE: 109 meq/L (ref 95–110)
CREATININE BLOOD: 0.77 mg/dL (ref 0.44–1.27)
GLUCOSE: 106 mg/dL — AB (ref 70–99)
POTASSIUM: 3.6 meq/L (ref 3.3–5.0)
SODIUM: 139 meq/L (ref 135–145)
UREA NITROGEN, BLOOD (BUN): 14 mg/dL (ref 8–22)

## 2015-05-02 LAB — HEPATIC FUNCTION PANEL
ALANINE TRANSFERASE (ALT): 16 U/L (ref 6–63)
ALBUMIN: 4.1 g/dL (ref 3.4–4.8)
ALKALINE PHOSPHATASE (ALP): 79 U/L (ref 35–115)
ASPARTATE TRANSAMINASE (AST): 18 U/L (ref 15–43)
BILIRUBIN DIRECT: 0.1 mg/dL (ref 0.0–0.2)
BILIRUBIN TOTAL: 0.5 mg/dL (ref 0.3–1.3)
PROTEIN: 6.7 g/dL (ref 6.3–8.3)

## 2015-05-02 LAB — CBC WITH DIFFERENTIAL
BASOPHILS % AUTO: 0.6 %
BASOPHILS ABS AUTO: 0 10*3/uL (ref 0–0.2)
EOSINOPHIL % AUTO: 1.7 %
EOSINOPHIL ABS AUTO: 0.1 10*3/uL (ref 0–0.5)
HEMATOCRIT: 33.2 % — AB (ref 40–52)
HEMOGLOBIN: 10.4 g/dL — AB (ref 14.0–18.0)
LYMPHOCYTE ABS AUTO: 1.4 10*3/uL (ref 1.0–4.8)
LYMPHOCYTES % AUTO: 18.3 %
MCH: 22.2 pg — AB (ref 27–33)
MCHC: 31.5 % — AB (ref 32–36)
MCV: 70.5 UM3 — AB (ref 80–100)
MONOCYTES % AUTO: 8.9 %
MONOCYTES ABS AUTO: 0.7 10*3/uL (ref 0.1–0.8)
MPV: 7.2 UM3 (ref 6.8–10.0)
NEUTROPHIL ABS AUTO: 5.2 10*3/uL (ref 1.80–7.70)
NEUTROPHILS % AUTO: 70.5 %
PLATELET COUNT: 336 10*3/uL (ref 130–400)
RDW: 18.9 U — AB (ref 0–14.7)
RED CELL COUNT: 4.7 10*6/uL (ref 4.5–5.9)
WHITE BLOOD CELL COUNT: 7.4 10*3/uL (ref 4.5–11.0)

## 2015-05-02 LAB — B-TYPE NATRIURETIC PEPTIDE: B-TYPE NATRIURETIC PEPTIDE: 14 pg/mL (ref 0–100)

## 2015-05-02 LAB — LIPASE: LIPASE: 40 U/L (ref 13–51)

## 2015-05-02 LAB — TROPONIN I

## 2015-05-02 MED ORDER — ASPIRIN 81 MG CHEWABLE TABLET
81.0000 mg | CHEWABLE_TABLET | Freq: Every day | ORAL | Status: DC
Start: 2015-05-03 — End: 2015-05-03

## 2015-05-02 MED ORDER — ATORVASTATIN 40 MG TABLET
40.0000 mg | ORAL_TABLET | Freq: Every day | ORAL | Status: DC
Start: 2015-05-03 — End: 2015-05-03

## 2015-05-02 NOTE — ED Triage Note (Signed)
BIBA for c/o 9/10 L sided chest pain that started 20 minutes ago. Onset while standing at store. Pain radiates to neck and back. Pt also c/o shortness of breath and nausea. 324 ASA and NTG x 3 en route. Pain now at 8/10. Pt reports hx of MI 4 years ago.

## 2015-05-02 NOTE — ED Nursing Note (Signed)
Pt to room with tech. A/ox4, seen 2 days ago for CP, left AMA. Pt presents today with similar CP, crushing, left sternal border, 7/10, constant and no provoking factors, radiating to jaw, that began while standing in line at store at [employer].

## 2015-05-02 NOTE — ED Progress Note (Signed)
Emergency Department Triage Note     Subjective: Nicholas Krueger is a 45yr old male who presents to the ED with a chief complaint of CP    Fard Borunda  [address]  [place] [address]  [birth year]  [phone] (home)   tax ID [id number]       Objective:    BP 118/72  Pulse 94  Temp 37 C (98.6 F) (Oral)  Resp 16  Wt 88.5 kg (195 lb)  SpO2 96%  BMI 25.73 kg/m2     General:  No acute distress  HEENT:  Atraumatic  Neurologic:  Normal speech.  Normal tone.    Skin:  Dry    Plan:  D  Initial studies to evaluate this problem will include labs, imaging and EKG. Further evaluation will proceed in the Emergency Department.     Seen and initially evaluated by:  Hermine Messick, MD, Attending Physician, Department of Emergency Medicine       This patient was seen by a physician in triage. Please see the ED Initial Note for full details of this patient's care. This note covers the brief initial evaluation performed to obtain initial studies to expedite the patient's care. It is not intended to be a comprehensive document of the patient's ED visit.    This patient was instructed that this preliminary assessment and any studies obtained do not constitute a full workup of the patient's chief complaint. The patient was instructed to wait in the assigned area after the triage evaluation to be reevaluated by a physician after initial studies are completed.

## 2015-05-02 NOTE — ED Initial Note (Signed)
EMERGENCY DEPARTMENT PHYSICIAN NOTE - Trisha Morandi       Date of Service:   05/02/2015  9:01 PM Patient's PCP: No Pcp No Pcp   Note Started: 05/02/2015 23:31 DOB: Jul 04, 1970             Chief Complaint   Patient presents with    Chest Pain           The history provided by the patient.  Interpreter used: No    Nicholas Krueger is a 45yr old male, with a past medical history significant for HTN, HLD, previous MI, who presents to the ED with a chief complaint of chest pain that began 2 hours prior to presentation. He was waiting in line at a store and developed sudden squeezing chest pain in his L chest. He sat down and it proceeded to get worse and he called an ambulance. They gave him ASA and nitroglycerin which barely helped.     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       He says that his current pain is similar to his pain when he had an MI 4 years ago.     Of note he has a history of presenting with similar chest pain and often leaves AMA or elopes. Most recently he was here two days prior with chest pain. He said he didn't feel like he had to be admitted at the time and left.     Quality: squeezing  Location: L chest with radiation to L jaw  Severity: 8/10  Time Course: constant  Progression: unchanged  Duration: 2 hours  Palliative factors: nothing makes it better.  Provocative factors: sitting down made it worse, not worse with exertion  Associated symptoms: diaphoresis, nausea, SOB  Pertinent negatives: no orthopnea, PND, LE edema    A full history, including pertinent past medical and social history was reviewed.    HISTORY:  There are no active hospital problems to display for this patient.   Allergies   Allergen Reactions    Toradol [Ketorolac Tromethamine] Nausea/Vomiting    Tramadol Nausea/Vomiting      Past Medical History:    Chronic pain                                                   Hypoglycemia                                               Past Surgical History:    ------------other-------------                                 ------------other-------------                                Social History    Marital status: DIVORCED            Spouse name:  Years of education:                 Number of children:               Occupational History    None on file    Social History Main Topics    Smoking status: Current Every Day Smoker                                                     Packs/day: 0.00      Years: 0.00           Smokeless status: Not on file                       Alcohol use: Not on file     Drug use: Yes                Special: Marijuana    Sexual activity: Not on file          Other Topics            Concern    None on file    Social History Narrative    None on file     No family history on file.             Review of Systems   Constitutional: Negative for chills and fever.   HENT: Negative for congestion, rhinorrhea and sore throat.    Eyes: Negative for visual disturbance.   Respiratory: Positive for shortness of breath. Negative for cough.    Cardiovascular: Positive for chest pain. Negative for palpitations and leg swelling.   Gastrointestinal: Positive for nausea. Negative for abdominal pain, diarrhea and vomiting.   Endocrine: Negative for polyuria.   Genitourinary: Negative for dysuria and hematuria.   Musculoskeletal: Negative for arthralgias, back pain and joint swelling.   Skin: Negative for rash.   Allergic/Immunologic: Negative for environmental allergies.   Neurological: Positive for dizziness and light-headedness. Negative for syncope and headaches.   Hematological: Negative for adenopathy.   Psychiatric/Behavioral: Negative for agitation and confusion.       TRIAGE VITAL SIGNS:  Temp: 37 C (98.6 F) (05/02/15 1610)  Temp src: Oral (05/02/15 9604)  Pulse: 94 (05/02/15 2058)  BP: 118/72 (05/02/15 5409)  Resp: 16 (05/02/15 2058)  SpO2: 96 %  (05/02/15 2058)  Weight: 88.5 kg (195 lb) (05/02/15 8119)    Physical Exam   Constitutional: He is oriented to person, place, and time. He appears well-developed and well-nourished. No distress.   HENT:   Head: Normocephalic.   Mouth/Throat: Oropharynx is clear and moist.   Eyes: Conjunctivae and EOM are normal. Pupils are equal, round, and reactive to light. No scleral icterus.   Neck: Normal range of motion. Neck supple. No JVD present.   Cardiovascular: Normal rate, regular rhythm, normal heart sounds and intact distal pulses.  Exam reveals no gallop and no friction rub.    No murmur heard.  Pulmonary/Chest: Effort normal and breath sounds normal. No respiratory distress. He has no wheezes. He has no rales.   Abdominal: Soft. Bowel sounds are normal. He exhibits no distension. There is no tenderness. There is no rebound and no guarding.   Musculoskeletal: Normal range of motion. He exhibits no edema, tenderness or deformity.   Lymphadenopathy:  He has no cervical adenopathy.   Neurological: He is alert and oriented to person, place, and time. No cranial nerve deficit.   No focal deficits   Skin: Skin is warm and dry. No rash noted.   Psychiatric: He has a normal mood and affect. His behavior is normal.   Nursing note and vitals reviewed.        INITIAL ASSESSMENT & PLAN, MEDICAL DECISION MAKING, ED COURSE  Nicholas Krueger is a 21yr male who presents with a chief complaint of chest pain.     Differential includes, but is not limited to: ACS, UA, PTX, PE, CHF, dissection, chest wall pain      The results of the ED evaluation were notable for the following:    Pertinent lab results: Trop negative  Pertinent imaging results (reviewed and interpreted independently by me):     CXR - normal    Radiology reads:     CXR - No acute cardiopulmonary abnormality.    EKG (reviewed and interpreted independently by me): The rhythm is sinus, rate 85, axis normal, no ST/T changes, normal EKG, normal sinus rhythm, unchanged from  previous tracings.      Chart Review: I reviewed the patient's prior medical records. Pertinent information that is relevant to this encounter: multiple previous encounters for chest pain and has left AMA multiple times.      Patient Summary: 80y M with history of HTN, HLD who presents with chest pain concerning for angina. No EKG changes and troponin negative. Was here two days prior with intended admission to CPER for risk stratification but eloped.  Patient willing to stay this time. Plan for CPER admission for risk stratification.      LAST VITAL SIGNS:  Temp: 37 C (98.6 F) (05/02/15 1610)  Temp src: Oral (05/02/15 9604)  Pulse: 94 (05/02/15 2058)  BP: 118/72 (05/02/15 5409)  Resp: 16 (05/02/15 2058)  SpO2: 96 % (05/02/15 2058)  Weight: 88.5 kg (195 lb) (05/02/15 2058)      Clinical Impression:   Chest pain          Disposition: Observation CPER        PRESENT ON ADMISSION:  Are any of the following four conditions present or suspected on admission: decubitus ulcer, infection from an intravascular device, infection due to an indwelling catheter, surgical site infection or pneumonia? No.    PATIENT'S GENERAL CONDITION:  Fair: Vital signs are stable and within normal limits. Patient is conscious but may be uncomfortable. Indicators are favorable.      Electronically signed by: Abran Duke, MD, Resident        This patient was seen, evaluated, and care plan was developed with the resident.  I agree with the findings and plan as outlined in our combined note. I personally independently visualized the images and tracings as noted above.      Konrad Felix, MD      Electronically signed by: Konrad Felix, MD, Attending Physician

## 2015-05-03 ENCOUNTER — Ambulatory Visit (HOSPITAL_BASED_OUTPATIENT_CLINIC_OR_DEPARTMENT_OTHER): Payer: Medicaid Other

## 2015-05-03 DIAGNOSIS — R079 Chest pain, unspecified: Principal | ICD-10-CM

## 2015-05-03 DIAGNOSIS — R0602 Shortness of breath: Secondary | ICD-10-CM

## 2015-05-03 LAB — ELECTROCARDIOGRAM WITH RHYTHM STRIP
QTC: 444
QTC: 447

## 2015-05-03 LAB — TROPONIN I

## 2015-05-03 NOTE — ED Nursing Note (Signed)
Blood work drawn from IV for repeat troponin, good blood flow, unable to flush line after blood draw, pt unwilling to allow new IV to be placed at this time

## 2015-05-03 NOTE — Discharge Instructions (Signed)
Please f/u with your PCP to order a stress test as an outpatient

## 2015-05-03 NOTE — ED Nursing Note (Signed)
Md at bedside to discuss plan with pt.

## 2015-05-03 NOTE — ED Nursing Note (Signed)
Patient AMA from ED with AVS, Rx, related instructions and all belongings. Patient is in NAD, is awake/alert skin, is pink/warm/dry. Pt leaves the ED ambulatory with self.

## 2015-05-03 NOTE — ED Progress Note (Addendum)
Patient wanting to leave AMA. Explained to him that we want him to be evaluated by Chest Pain ER with likely stress test. He says that the last time he had a chemical stress test in 2015 his heart beat so fast that he had a seizure. He wants to just get an outpatient stress test so he can prepare for it, plus he has to get to "class" at 8AM. Patient did not receive any pain medication for his chest pain and his chest pain is now improved. Educated him on risks of leaving AMA including death. He understands the risks and still wants to leave.    Everlene Other, MD  Internal Medicine, PGY-1    The patient was seen, evaluated and care plan developed with the resident.  I agree with the findings and plan as outlined in our combined note.    Electronically signed by: Konrad Felix, MD, Attending Physician  Assistant Clinical Professor, Department of Emergency Medicine

## 2015-05-03 NOTE — ED Nursing Note (Signed)
Patient would like to leave AMA. Dr. Regino Schultze notified. Paperwork given. Patient pulled out own IV. Patient wrapped his own wound. IV was witnessed by [employer] and placed in the sharps.

## 2015-05-03 NOTE — ED Nursing Note (Signed)
Report received from Robyn RN for one hour lunch coverage

## 2015-05-03 NOTE — ED Nursing Note (Signed)
Pt awake and alert, endorses relief of CP.

## 2015-05-06 LAB — ELECTROCARDIOGRAM WITH RHYTHM STRIP: QTC: 413

## 2015-08-14 DIAGNOSIS — R0789 Other chest pain: Secondary | ICD-10-CM

## 2015-08-14 NOTE — ED Notes (Signed)
Lab at bedside for draw

## 2015-08-14 NOTE — ED Notes (Signed)
Pt discharged with instructions and all questions answered to home

## 2015-08-14 NOTE — ED Provider Notes (Signed)
History   Ronald Wyatt is a 45 y.o. year old male presenting with Chest Pain  .    HPI Comments: Patient presents via ambulance. He states he had a tight chest pain since earlier in the afternoon. He was riding the bus when it occurred. Per his history he has been to multiple emergency departments. He states he had a cardiac workup in [place] 9 months ago. He has also been seen at [facility] as well as [place]. He does not stick around long enough to undergo formal stress testing. He states he has never had myocardial injury but he has a strong family history and he smokes cigarettes. He has had this kind of pain and problem multiple times before. He states he is on his way to [place] where he is going to move.    The history is provided by the patient.       Past Medical History:   Diagnosis Date   . Hypertension    . MI (mitral incompetence)        History reviewed. No pertinent surgical history.    History reviewed. No pertinent family history.    Social History   Substance Use Topics   . Smoking status: Current Every Day Smoker   . Smokeless tobacco: None   . Alcohol use No     Other Social History Comments:       There are no discharge medications for this patient.      Review of Systems   Constitutional: Negative for appetite change, diaphoresis, fatigue and fever.   HENT: Negative for ear pain, mouth sores, rhinorrhea and trouble swallowing.    Eyes: Negative for photophobia and visual disturbance.   Respiratory: Positive for chest tightness and shortness of breath. Negative for cough and wheezing.    Cardiovascular: Positive for chest pain. Negative for leg swelling.   Gastrointestinal: Negative for abdominal distention, abdominal pain, constipation, diarrhea, nausea and vomiting.   Endocrine: Negative for polyuria.   Musculoskeletal: Negative for arthralgias, joint swelling and neck pain.   Skin: Negative for rash.   Neurological: Negative for dizziness, weakness and numbness.              Physical Exam   BP (!) 104/57 Comment: 16 RR  Pulse 72 Comment: 16 RR  Temp 36.8 ?C (98.3 ?F) (Oral)   Resp 16  Wt 86.2 kg (190 lb)  SpO2 93% Comment: 16 RR    Physical Exam   Constitutional: He is oriented to person, place, and time. He appears well-developed and well-nourished.   HENT:   Head: Normocephalic and atraumatic.   Nose: Nose normal.   Mouth/Throat: Oropharynx is clear and moist.   Eyes: EOM are normal. Pupils are equal, round, and reactive to light.   Neck: Normal range of motion. Neck supple.   Cardiovascular: Normal rate, regular rhythm, normal heart sounds and intact distal pulses.    Pulmonary/Chest: Effort normal. No accessory muscle usage. No respiratory distress. He has no decreased breath sounds. He has wheezes in the right lower field.   Abdominal: Soft. Bowel sounds are normal.   Musculoskeletal: Normal range of motion.   Neurological: He is alert and oriented to person, place, and time.   Skin: Skin is warm and dry.   Psychiatric: He has a normal mood and affect. Judgment normal.       ED Course     Results for orders placed or performed during the hospital encounter of 08/14/15 (from the  past 24 hour(s))   CBC with Auto Differential -STAT    Collection Time: 08/14/15  5:53 PM   Result Value Ref Range    WBC 5.0 4.8 - 10.8 10*3/?L    RBC 4.94 4.70 - 6.10 10*6/?L    Hemoglobin 11.2 (L) 12.0 - 18.0 g/dL    HCT 96.0 (L) 45.4 - 54.0 %    MCV 69.1 (L) 81.0 - 99.0 fL    MCH 22.6 (L) 27.0 - 34.0 pg    MCHC 32.7 32.0 - 36.0 g/dL    RDW 09.8 (H) 11.9 - 14.5 %    Platelet Count 361 150 - 400 10*3/?L    MPV 7.3 (L) 7.4 - 10.4 fL    Neutrophils % 56 35 - 70 %    Lymphocytes % 27 25 - 45 %    Monocytes % 13 (H) 0 - 12 %    Eosinophils % 2 0 - 7 %    Basophils % 1 0 - 2 %    Neutrophils, Absolute 2.8 1.6 - 7.3 10*3/?L    Lymphocytes, Absolute 1.4 1.1 - 4.3 10*3/?L    Monocytes, Absolute 0.6 0.0 - 1.2 10*3/?L    Eosinophils, Absolute 0.1 0.0 - 0.7 10*3/?L    Basophils, Absolute 0.1 0.0 - 0.2  10*3/?L    Differential Type Smear Review     Platelet Estimate Normal Normal    Anisocytosis 1+ (A) (none)    Microcytes 2+ (A) (none)    Ovalocytes 1+ (A) (none)   Comprehensive Metabolic Panel -STAT    Collection Time: 08/14/15  5:53 PM   Result Value Ref Range    Sodium 139 135 - 143 mmol/L    Potassium 3.9 3.5 - 5.1 mmol/L    Chloride 106 98 - 111 mmol/L    CO2 - Carbon Dioxide 26.0 21.0 - 31.0 mmol/L    Glucose 88 80 - 99 mg/dL    BUN 14 6 - 23 mg/dL    Creatinine 1.47 8.29 - 1.30 mg/dL    Calcium 9.2 8.6 - 56.2 mg/dL    AST - Aspartate Aminotransferase 16 10 - 50 IU/L    ALT - Alanine Amino transferase 17 7 - 52 IU/L    Alkaline Phosphatase 93 34 - 104 IU/L    Bilirubin Total 0.2 (L) 0.3 - 1.2 mg/dL    Protein Total 6.7 6.0 - 8.0 g/dL    Albumin 4.3 3.5 - 5.0 g/dL    Globulin 2.4 2.2 - 3.7 g/dL    Albumin/Globulin Ratio 1.8 >0.9    Anion Gap 7.0 3.0 - 11.0 mmol/L    GFR Estimate >60 >=60 mL/min/1.63m*2    GFR Additional Info     PT & PTT -STAT    Collection Time: 08/14/15  5:53 PM   Result Value Ref Range    Protime 12.7 9.9 - 13.4 Seconds    APTT 30 23 - 36 Seconds    INR 1.1 0.9 - 1.2   Troponin-I -STAT    Collection Time: 08/14/15  5:53 PM   Result Value Ref Range    Troponin I <0.03 <=0.04 ng/mL     EKG is normal sinus normal axis no ST elevation or ST depression.  No orders to display       Procedures    MDM  Patient looks to be comfortable and has not had any prior myocardial injury. I don't believe that is occurring again today as he has a normal EKG. I predict his  troponin will be normal after several hours of pain. 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 He does not show any psychiatric instability today but it is unlikely he has a true cardiopulmonary issue today. It is extremely difficult to ascertain his overall cardiopulmonary status with his highly erratic behavior. I have explained this to him. I don't think the rule out is  needed as I think it will be normal as it has been on multiple previous admissions and evaluations per his history. He readily admits that this has been the case. I explained to him that he needs a detailed workup followed by a primary care physician and in order to do this he needs to stay in one location for several months if not longer. I recommended he have some more stability to assure his safety. He states that he thinks he can do this and [place]. I do not think it is pertinent to withhold any pain control at this point in time as he is never frequented this emergency department with his history he needs a primary care and good follow-up as this is a continual problem for him. 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    ED Disposition: Discharge    Diagnoses that have been ruled out:   None   Diagnoses that are still under consideration:   None   Final diagnoses:   Atypical chest pain        Mellody Drown, MD  08/15/15 [phone]

## 2015-08-14 NOTE — ED Notes (Signed)
Pt reports nausea 

## 2015-08-14 NOTE — ED Notes (Signed)
MD at bedside discussing results of testing.. 

## 2015-08-14 NOTE — ED Notes (Signed)
Bed: 103-01  Expected date: 08/14/15  Expected time:   Means of arrival: Ambulance  Comments:  M/44 CP/[place]

## 2015-08-14 NOTE — Discharge Instructions (Signed)
Electrocardiography  It does not appear that there is an active process occurring but you really need to complete a full evaluation by a regular doctor that follows your health.  Electrocardiography is a test to check the heart. It looks at how your heart beats. It is done if:   ? You are having a heart attack.  ? You may have had a heart attack in the past.  ? You are having a health checkup.  PROCEDURE  ? This test is easy and painless.  ? You will remove your clothes from the waist up, wear a hospital gown, and lie down on your back.  ? Small round pads (electrodes) will be placed on your chest, arms, and legs. The round pads are attached to wires that go to a machine. The machine records the electrical activity of your heart.  ? You will be asked to relax and lie very still.  AFTER THE PROCEDURE  ? If the test was done as part of a routine exam, you can go back to your normal activity as told by your doctor.  ? Your results will be looked at by a heart doctor (cardiologist).  ? Ask when your test results will be ready. Make sure you get your test results.     This information is not intended to replace advice given to you by your health care provider. Make sure you discuss any questions you have with your health care provider.     Document Released: 02/14/2008 Document Revised: 03/24/2014 Document Reviewed: 07/14/2011  Elsevier Interactive Patient Education ?2016 Elsevier Inc.

## 2015-08-14 NOTE — ED Notes (Signed)
Pt medicated per MD order for continued pain and nausea.

## 2015-08-14 NOTE — ED Triage Notes (Signed)
Pt arrives to ER for evaluation of left sided CP with radiation to left arm and jaw.  Pt also reports SOB, nausea, dizziness and blurred vision starting at 1700.  Pt took own ntg x 2 ( apart.) No relief.  Pt was given 324 mg aspirin by medics

## 2015-08-14 NOTE — ED Notes (Signed)
Pt medicated per MD order for pain

## 2015-08-15 ENCOUNTER — Inpatient Hospital Stay
Admit: 2015-08-15 | Discharge: 2015-08-15 | Disposition: A | Discharge status: Home / Self Care | Payer: MEDICAID | Attending: MD

## 2015-08-15 LAB — COMPREHENSIVE METABOLIC PANEL
ALT - Alanine Aminotransferase: 17 IU/L (ref 7–52)
AST - Aspartate Aminotransferase: 16 IU/L (ref 10–50)
Albumin/Globulin Ratio: 1.8 (ref >0.9)
Albumin: 4.3 g/dL (ref 3.5–5.0)
Alkaline Phosphatase: 93 IU/L (ref 34–104)
Anion Gap: 7 mmol/L (ref 3.0–11.0)
BUN: 14 mg/dL (ref 6–23)
Bilirubin Total: 0.2 mg/dL — ABNORMAL LOW (ref 0.3–1.2)
CO2 - Carbon Dioxide: 26 mmol/L (ref 21.0–31.0)
Calcium: 9.2 mg/dL (ref 8.6–10.3)
Chloride: 106 mmol/L (ref 98–111)
Creatinine: 0.78 mg/dL (ref 0.65–1.30)
GFR Estimate: >60 mL/min/{1.73_m2} (ref >=60)
Globulin: 2.4 g/dL (ref 2.2–3.7)
Glucose: 88 mg/dL (ref 80–99)
Potassium: 3.9 mmol/L (ref 3.5–5.1)
Protein Total: 6.7 g/dL (ref 6.0–8.0)
Sodium: 139 mmol/L (ref 135–143)

## 2015-08-15 LAB — CBC WITH AUTO DIFFERENTIAL
Basophils %: 1 % (ref 0–2)
Basophils, Absolute: 0.1 10*3/??L (ref 0.0–0.2)
Eosinophils %: 2 % (ref 0–7)
Eosinophils, Absolute: 0.1 10*3/??L (ref 0.0–0.7)
HCT: 34.1 % — ABNORMAL LOW (ref 42.0–54.0)
Hemoglobin: 11.2 g/dL — ABNORMAL LOW (ref 12.0–18.0)
Lymphocytes %: 27 % (ref 25–45)
Lymphocytes, Absolute: 1.4 10*3/??L (ref 1.1–4.3)
MCH: 22.6 pg — ABNORMAL LOW (ref 27.0–34.0)
MCHC: 32.7 g/dL (ref 32.0–36.0)
MCV: 69.1 fL — ABNORMAL LOW (ref 81.0–99.0)
MPV: 7.3 fL — ABNORMAL LOW (ref 7.4–10.4)
Monocytes %: 13 % — ABNORMAL HIGH (ref 0–12)
Monocytes, Absolute: 0.6 10*3/??L (ref 0.0–1.2)
Neutrophils %: 56 % (ref 35–70)
Neutrophils, Absolute: 2.8 10*3/??L (ref 1.6–7.3)
Platelet Count: 361 10*3/??L (ref 150–400)
Platelet Estimate: NORMAL
RBC: 4.94 10*6/??L (ref 4.70–6.10)
RDW: 19 % — ABNORMAL HIGH (ref 11.5–14.5)
WBC: 5 10*3/??L (ref 4.8–10.8)

## 2015-08-15 LAB — PT & PTT
APTT: 30 Seconds (ref 23–36)
INR: 1.1 (ref 0.9–1.2)
Protime: 12.7 Seconds (ref 9.9–13.4)

## 2015-08-15 LAB — TROPONIN I: Troponin I: <0.03 ng/mL (ref <=0.04)

## 2015-08-15 MED ORDER — HYDROmorphone (DILAUDID) syringe 1 mg
1 | Freq: Once | INTRAMUSCULAR | Status: AC
Start: 2015-08-15 — End: 2015-08-14
  Administered 2015-08-15: 01:00:00 1 mg via INTRAVENOUS

## 2015-08-15 MED ORDER — ondansetron (ZOFRAN) injection 4 mg
4 | Freq: Once | INTRAMUSCULAR | Status: AC
Start: 2015-08-15 — End: 2015-08-14
  Administered 2015-08-15: 02:00:00 4 mg via INTRAVENOUS

## 2015-08-15 MED ORDER — HYDROmorphone (DILAUDID) syringe 0.5 mg
1 | Freq: Once | INTRAMUSCULAR | Status: AC
Start: 2015-08-15 — End: 2015-08-14
  Administered 2015-08-15: 02:00:00 1 mg via INTRAVENOUS

## 2015-08-15 MED FILL — HYDROMORPHONE (PF) 1 MG/ML INJECTION SYRINGE: 1 mg/mL | INTRAMUSCULAR | Qty: 1

## 2015-08-15 MED FILL — ONDANSETRON HCL (PF) 4 MG/2 ML INJECTION SOLUTION: 4 | INTRAMUSCULAR | Qty: 2

## 2016-02-24 DIAGNOSIS — G8929 Other chronic pain: Secondary | ICD-10-CM

## 2016-02-24 NOTE — ED Triage Notes (Signed)
Pt c/c of right and left lower abd pain  Pt states onset 20 minutes ago while shopping at [employer]  Pt denies N/V/D  Pt assessed  Vitals stable  Acuity 3 in abd pain patient

## 2016-02-24 NOTE — Discharge Instructions (Signed)
Patient Education   Patient Education     Abdominal Pain, Adult  Many things can cause belly (abdominal) pain. Most times, the belly pain is not dangerous. Many cases of belly pain can be watched and treated at home.  HOME CARE   ? Do not take medicines that help you go poop (laxatives) unless told to by your doctor.  ? Only take medicine as told by your doctor.  ? Eat or drink as told by your doctor. Your doctor will tell you if you should be on a special diet.  GET HELP IF:  ? You do not know what is causing your belly pain.  ? You have belly pain while you are sick to your stomach (nauseous) or have runny poop (diarrhea).  ? You have pain while you pee or poop.  ? Your belly pain wakes you up at night.  ? You have belly pain that gets worse or better when you eat.  ? You have belly pain that gets worse when you eat fatty foods.  ? You have a fever.  GET HELP RIGHT AWAY IF:   ? The pain does not go away within 2 hours.  ? You keep throwing up (vomiting).  ? The pain changes and is only in the right or left part of the belly.  ? You have bloody or tarry looking poop.  MAKE SURE YOU:   ? Understand these instructions.  ? Will watch your condition.  ? Will get help right away if you are not doing well or get worse.     This information is not intended to replace advice given to you by your health care provider. Make sure you discuss any questions you have with your health care provider.     Document Released: 08/20/2007 Document Revised: 03/24/2014 Document Reviewed: 11/10/2012  Elsevier Interactive Patient Education ?2017 Elsevier Inc.

## 2016-02-24 NOTE — ED Provider Notes (Signed)
Chief Complaint:   Chief Complaint   Patient presents with   . Abdominal Pain       HPI:  This is a 45 y.o. male who presents with Abdominal pain    Location: Generalized  Onset: Gradual  Severity: Mild to moderate  Duration: 20 minutes  Associated Symptoms: Nausea   Modifying factors: Chronic abdominal pain, currently homeless    Patient arrives stating that he is abdominal pain began approximately 20 minutes ago while shopping at [employer]. The patient denies any vomiting or diarrhea, denies any fevers or chills. The patient reports no dysuria, no blood in his stool, no history of GI bleed. The patient appears comfortable.    Review of Systems:    Review of 10 systems is as above stated, negative, or non-contributory    Medications:   Discharge Medication List as of 02/24/2016  8:50 PM      CONTINUE these medications which have NOT CHANGED    Details   ASPIRIN ORAL 81 mg., Historical Med      atorvastatin (LIPITOR) 10 MG tablet 10 mg., Historical Med      lisinopril (PRINIVIL,ZESTRIL) 20 MG tablet Take by mouth., Historical Med      nitroglycerin (NITROSTAT) 0.4 MG SL tablet 0.4 mg., Historical Med             Allergies:  Allergies   Allergen Reactions   . Diphenhydramine Nausea And Vomiting   . Toradol [Ketorolac] Hives   . Tramadol Hives   . Tape [place] Organ Tape-Silicones] Rash     Past medical history:  Past Medical History:   Diagnosis Date   . Hypertension    . MI (mitral incompetence)    . Myocardial infarction      Social history:  Social History     Social History Main Topics   . Smoking status: Current Every Day Smoker   . Smokeless tobacco: Not on file   . Alcohol use No   . Drug use:      Types: Marijuana   . Sexual activity: Not on file     Social History   . Marital status: Single     Spouse name: N/A   . Number of children: N/A   . Years of education: N/A     Other Topics Concern   . Not on file       Family history:History reviewed. No pertinent family history.    Past surgical history:   Past  Surgical History:   Procedure Laterality Date   . BACK SURGERY     . COSMETIC SURGERY      reconstructive   . JOINT REPLACEMENT      knee       Nurse's notes were also reviewed for past social/family/medical history    Physical Exam:  ED Triage Vitals   Enc Vitals Group      BP 02/24/16 1906 136/71      Pulse 02/24/16 1906 110      Resp 02/24/16 1906 22      Temp 02/24/16 1906 36.9 ?C (98.4 ?F)      Temp src 02/24/16 1906 Temporal      SpO2 02/24/16 1906 97 %      Weight 02/24/16 1909 86.2 kg (190 lb)      Height --       Head Circumference --       Peak Flow --       Pain Score 02/24/16 1906 Nine  Pain Loc --       Pain Edu? --       Excl. in GC? --      General: 45 y.o. male in no acute distress    HEENT: normocephalic, atraumatic  Pupils equal, EOM intact  Oropharynx moist posteriorly clear  Neck supple  Lungs clear to auscultation with rhonchorous breath sounds bilaterally  Cardiac: normal rate and rhythm  Abdomen: soft, non-tender, nondistended   Extremities: no trauma or edema  Skin: Warm and dry  Neuro: alert and oriented x 4, without focal findings.          Labs   Results for orders placed or performed during the hospital encounter of 02/24/16 (from the past 24 hour(s))   CBC with Auto Differential -STAT    Collection Time: 02/24/16  8:04 PM   Result Value Ref Range    WBC 8.4 4.8 - 10.8 10*3/?L    RBC 4.60 (L) 4.70 - 6.10 10*6/?L    Hemoglobin 10.2 (L) 12.0 - 18.0 g/dL    HCT 16.1 (L) 09.6 - 54.0 %    MCV 69.2 (L) 81.0 - 99.0 fL    MCH 22.2 (L) 27.0 - 34.0 pg    MCHC 32.0 32.0 - 36.0 g/dL    RDW 04.5 (H) 40.9 - 14.5 %    Platelet Count 346 150 - 400 10*3/?L    MPV 7.5 7.4 - 10.4 fL    Neutrophils % 63 35 - 70 %    Lymphocytes % 22 (L) 25 - 45 %    Monocytes % 12 0 - 12 %    Eosinophils % 3 0 - 7 %    Basophils % 0 0 - 2 %    Neutrophils, Absolute 5.3 1.6 - 7.3 10*3/?L    Lymphocytes, Absolute 1.9 1.1 - 4.3 10*3/?L    Monocytes, Absolute 1.0 0.0 - 1.2 10*3/?L    Eosinophils, Absolute 0.3 0.0 - 0.7  10*3/?L    Basophils, Absolute 0.0 0.0 - 0.2 10*3/?L    Differential Type Automated Differential    Comprehensive Metabolic Panel -STAT    Collection Time: 02/24/16  8:04 PM   Result Value Ref Range    Sodium 138 135 - 143 mmol/L    Potassium 3.9 3.5 - 5.1 mmol/L    Chloride 106 98 - 111 mmol/L    CO2 - Carbon Dioxide 22.2 21.0 - 31.0 mmol/L    Glucose 99 80 - 99 mg/dL    BUN 15 6 - 23 mg/dL    Creatinine 8.11 9.14 - 1.30 mg/dL    Calcium 8.5 (L) 8.6 - 10.3 mg/dL    AST - Aspartate Aminotransferase 17 10 - 50 IU/L    ALT - Alanine Aminotransferase 15 7 - 52 IU/L    Alkaline Phosphatase 116 (H) 34 - 104 IU/L    Bilirubin Total 0.2 (L) 0.3 - 1.2 mg/dL    Protein Total 6.2 6.0 - 8.0 g/dL    Albumin 4.0 3.5 - 5.0 g/dL    Globulin 2.2 2.2 - 3.7 g/dL    Albumin/Globulin Ratio 1.8 >0.9    Anion Gap 9.8 3.0 - 11.0 mmol/L    GFR Estimate >60 >=60 mL/min/1.53m*2    GFR Additional Info             Radiology:   No orders to display       Treatment:Medications - No data to display        Nurse's notes have been reviewed.  Emergency Department Course and Medical Decision Making:    This is a 45 y.o.male who presents to the Emergency Department with Abdominal pain    Differential Diagnosis includes:      Appendicitis, colitis, diverticulitis, occult infection, bowel obstruction    In reviewing the patient's past medical history, it came to my attention that the patient has been seen in the emergency department approximately 100 times in the past 12 months. When reviewing where the patient has been, it was noted the patient was seen in the emergency department in [facility] yesterday as well as the day before, prior to that the patient was in [place]. The patient has been seen in over a dozen facilities up and down the [facility] over the past several months. Cursory review of these workups are negative.    The patient's clinical assessment was very reassuring. The patient's vital signs were stable, the patient was afebrile.  Baseline laboratory evaluation did not reveal any evidence of underlying sinister pathology. Upon reevaluation, the patient was noted to be sleeping. I do not feel the patient has an emergent medical condition at this time requiring further evaluation or medical intervention. I discussed these findings with the patient, questions and concerns were addressed, and the patient was discharged in stable condition.              Discharge Medication List as of 02/24/2016  8:50 PM          Diagnosis:     SNOMED CT(R)   1. Chronic abdominal pain  CHRONIC ABDOMINAL PAIN         This dictation was produced using voice recognition software.  Despite concurrent proofreading, please note that transcription errors are common and may not reflect the true intent of reporter.                 De Hollingshead, MD  02/24/16 2108

## 2016-02-24 NOTE — ED Notes (Signed)
Pt left in NAD by self. D/C instructions reviewed. Gave pt crackers and water at D/C

## 2016-02-25 ENCOUNTER — Inpatient Hospital Stay
Admit: 2016-02-25 | Discharge: 2016-02-25 | Disposition: A | Discharge status: Home / Self Care | Payer: MEDICAID | Attending: Emergency Medical Services

## 2016-02-25 DIAGNOSIS — F4320 Adjustment disorder, unspecified: Secondary | ICD-10-CM

## 2016-02-25 LAB — COMPREHENSIVE METABOLIC PANEL
ALT - Alanine Aminotransferase: 15 IU/L (ref 7–52)
AST - Aspartate Aminotransferase: 17 IU/L (ref 10–50)
Albumin/Globulin Ratio: 1.8 (ref >0.9)
Albumin: 4 g/dL (ref 3.5–5.0)
Alkaline Phosphatase: 116 IU/L — ABNORMAL HIGH (ref 34–104)
Anion Gap: 9.8 mmol/L (ref 3.0–11.0)
BUN: 15 mg/dL (ref 6–23)
Bilirubin Total: 0.2 mg/dL — ABNORMAL LOW (ref 0.3–1.2)
CO2 - Carbon Dioxide: 22.2 mmol/L (ref 21.0–31.0)
Calcium: 8.5 mg/dL — ABNORMAL LOW (ref 8.6–10.3)
Chloride: 106 mmol/L (ref 98–111)
Creatinine: 0.75 mg/dL (ref 0.65–1.30)
GFR Estimate: >60 mL/min/{1.73_m2} (ref >=60)
Globulin: 2.2 g/dL (ref 2.2–3.7)
Glucose: 99 mg/dL (ref 80–99)
Potassium: 3.9 mmol/L (ref 3.5–5.1)
Protein Total: 6.2 g/dL (ref 6.0–8.0)
Sodium: 138 mmol/L (ref 135–143)

## 2016-02-25 LAB — CBC WITH AUTO DIFFERENTIAL
Basophils %: 0 % (ref 0–2)
Basophils, Absolute: 0 10*3/??L (ref 0.0–0.2)
Eosinophils %: 3 % (ref 0–7)
Eosinophils, Absolute: 0.3 10*3/??L (ref 0.0–0.7)
HCT: 31.8 % — ABNORMAL LOW (ref 42.0–54.0)
Hemoglobin: 10.2 g/dL — ABNORMAL LOW (ref 12.0–18.0)
Lymphocytes %: 22 % — ABNORMAL LOW (ref 25–45)
Lymphocytes, Absolute: 1.9 10*3/??L (ref 1.1–4.3)
MCH: 22.2 pg — ABNORMAL LOW (ref 27.0–34.0)
MCHC: 32 g/dL (ref 32.0–36.0)
MCV: 69.2 fL — ABNORMAL LOW (ref 81.0–99.0)
MPV: 7.5 fL (ref 7.4–10.4)
Monocytes %: 12 % (ref 0–12)
Monocytes, Absolute: 1 10*3/??L (ref 0.0–1.2)
Neutrophils %: 63 % (ref 35–70)
Neutrophils, Absolute: 5.3 10*3/??L (ref 1.6–7.3)
Platelet Count: 346 10*3/??L (ref 150–400)
RBC: 4.6 10*6/??L — ABNORMAL LOW (ref 4.70–6.10)
RDW: 18.4 % — ABNORMAL HIGH (ref 11.5–14.5)
WBC: 8.4 10*3/??L (ref 4.8–10.8)

## 2016-02-25 LAB — ED INFORMATION EXCHANGE

## 2016-02-25 NOTE — ED Notes (Signed)
[facility] crisis worker was contacted. They will be here in about two hours to assess patient.

## 2016-02-25 NOTE — ED Notes (Signed)
PCU report from RN to ED Dr.    Coralie Keens in by: Self    Type of hold: Voluntary    Chief complaint and why did they come into the ED: Suicidal ideation w/ plan to O/D on friend's sleeping pills. Pt appears restless, frequently moving limbs, picking at his skin and rubbing his hands over his face. Needs to be redirected to answer questions. Denies thoughts of harm to others, states he hears voices that tell him he needs to be w/ his mother who was apparently murdered by pt's father in front of pt when he was 48 years old. Pt responds w/ positive symptoms to most questions asked. Pt takes no medications, sees no provider & admits to Marijuana use 2-3X/week. Pt contracts for safety while here.    Mental Health diagnosis: Bipolar Disorder, Schizoaffective, PTSD (from witnessing above mentioned murder)    Brief history of self harm/suicide attempts/harming others? SA by cutting wrists in 2006 Hospitalized inpatient in [place] at that time    Medical diagnosis: MI in 2013, no follow up    Collateral information from family/friends: None Available    Disposition idea for patient: Pt was seen yesterday in our ER for stomach pain. He has had over 100 ER visits in 2017. Pt's plan of self harm, which appeared since yesterday's visit, are rather vague & contingent on pt securing pills from an unnamed friend. 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 He seems unable to sit still. UDS, Detox if positive. D/C to home if able to contract for safety.

## 2016-02-25 NOTE — ED Triage Notes (Signed)
Patient advising suicidal and depressed and anxious.

## 2016-02-25 NOTE — ED Notes (Signed)
Security check done, and belongings in locker number 1.

## 2016-02-25 NOTE — ED Provider Notes (Signed)
[facility] Emergency Department Note    Name: Ronald Wyatt  DOB: 25-Nov-1970 45 y.o.  MRN: [id number]  CSN: [id number]    CHIEF COMPLAINT    Chief Complaint   Patient presents with   . Psychiatric Evaluation   . Suicidal       HISTORY of PRESENT ILLNESS    Ronald Wyatt is a 45 y.o. male who presents to the emergency department for evaluation of Suicidal ideations. History of schizoaffective disorder and PTSD.    Patient was seen yesterday in the emergency department and had a full workup that was reassuring for question of abdominal pain.    it came to my attention that the patient has been seen in the emergency department approximately 100 times in the past 12 months. When reviewing where the patient has been, it was noted the patient was seen in the emergency department in [facility] yesterday as well as the day before, prior to that the patient was in [place]. The patient has been seen in over a dozen facilities up and down the [facility] over the past several months    Today patient states he was feeling suicidal but no active plan. He states maybe he is thinking about borrowing his friend's medications takes or sleeping taking those. Patient has a history of malingering type behavior. He states the abdominal pain from yesterday went away, and at times he hears voices. He states that there is a significant member of his family, his mother that was murdered by his father was 75 years old and that he was thinking he was going to join her.    Old medical Records and patients previous charts and documents were reviewed.       PAST MEDICAL HISTORY    Past Medical History:   Diagnosis Date   . Hypertension    . MI (mitral incompetence)    . Myocardial infarction        PAST SURGICAL HISTORY    Past Surgical History:   Procedure Laterality Date   . BACK SURGERY     . COSMETIC SURGERY      reconstructive   . JOINT REPLACEMENT      knee       ALLERGIES     Allergies   Allergen Reactions   .  Diphenhydramine Nausea And Vomiting   . Toradol [Ketorolac] Hives   . Tramadol Hives   . Tape [place] Organ Tape-Silicones] Rash       MEDICATIONS    Discharge Medication List as of 02/26/2016  5:21 AM      CONTINUE these medications which have NOT CHANGED    Details   ASPIRIN ORAL 81 mg., Historical Med      atorvastatin (LIPITOR) 10 MG tablet 10 mg., Historical Med      lisinopril (PRINIVIL,ZESTRIL) 20 MG tablet Take by mouth., Historical Med      nitroglycerin (NITROSTAT) 0.4 MG SL tablet 0.4 mg., Historical Med             SOCIAL HISTORY    Social History     Social History Main Topics   . Smoking status: Current Every Day Smoker   . Smokeless tobacco: Not on file   . Alcohol use No   . Drug use:      Types: Marijuana   . Sexual activity: Not on file     Social History   . Marital status: Single     Spouse name: N/A   .  Number of children: N/A   . Years of education: N/A     Other Topics Concern   . Not on file       FAMILY HISTORY    No family history on file.      REVIEW OF SYSTEMS       Full 14 point review of systems were reviewed and are negative except as noted below    Constitutional: Negative for fever and chills.   HENT: Negative for congestion     Eyes: Negative for recent change.   Respiratory: Negative for cough and shortness of breath.    Cardiovascular: Negative for chest pain and palpitations.   Gastrointestinal: Negative for vomiting and diarrhea.   Endocrine: Negative for polydipsia and polyuria.   Genitourinary: Negative for dysuria, difficulty urinating.   Musculoskeletal: Negative for myalgias and gait problem.   Skin: Negative for rash and wound.   Allergic/Immunologic: Negative  immunocompromised state.   Neurological: Negative for weakness, numbness, headaches.   Hematological: Negative for adenopathy. Does not bruise/bleed easily.   Psychiatric/Behavioral: Negative for confusion. The patient is not nervous/anxious.        PHYSICAL EXAM    Initial Vitals [02/25/16 2224]   BP 123/70   Pulse 74     Resp 20   Temp 36.6 ?C (97.9 ?F)   SpO2 96 %       Oxygen saturation is normal on room air    GEN: Well nourished, Well developed, No acute distress    HENT:  Head is atraumatic, normocephalic.Throat, moist mucous membranes, no erythema or exudate    EYES: Pupils round reactive to light, no scleral icterus.    NECK:  Supple, no lymphadenopathy,  no nuchal rigidity, no c-spine tenderness    HEART: Regular rate and Regular Rhythm, no murmurs rubs or gallops    LUNGS: Clear to auscultation bilaterally, no wheezes, rhonchi, or rales.      ABDOMEN:  Soft, Non tender, no distention, no rebound, guarding or rigidity.     EXT: No clubbing cyanosis or edema    Neuro: Alert and oriented x 3. Cranial nerves intact by observation, no focal deficits, 5/5 upper and lower extremity strength, normal sensation throughout, Speech is fluid     Skin: warm, dry, intact, no rashes    Musculoskeletal: no discrete tenderness     Vascular: warm extremities    Psych:  appropriate affect, behavior, speech.       Review of Diagnostics:     EKG: My review and interpretation:    LABS: I reviewed all the Laboratory Data     No results found for this or any previous visit (from the past 24 hour(s)).    LAB SUMMARY:    IMAGING: :    No orders to display         IMAGING SUMMARY:      ASSESSMENT and PLAN:    Chief Complaint:    1.  Auditory hallucinations on and off for years questionable malingering behavior passive suicidal ideations . Differential Diagnosis includes: Depression, suicidal ideations, mental health disorder, malingering   1a.Diagnostic Plan: Urine drug screen      ER COURSE:      MEDICAL DECISION MAKING:  [name] is a 45 y.o. male  who presents to the emergency department for evaluation of Suicidal ideations. Drug screen was negative. [facility] mental health will come and evaluate the patient. Patient will not be placed on a hold. He has a history of  malingering type behavior. If [facility] says that he can be  discharged safely will be discharged. County recommends patient being seen by psychiatry in the morning and screening labs can be ordered.      In the Emergency Department patient received treatment with:    MEDICATIONS GIVEN DURING ER VISIT   Medications - No data to display      Patient Written for:   NEW ED PRESCRIPTIONS     Discharge Medication List as of 02/26/2016  5:21 AM            FOLLOW UP: Primary Dr  RETURN FOR: Worsening symptoms  DISCHARGE INSTRUCTIONS: Given for Emotional crisis      CONSULTATIONS  1. [facility] mental health      FINAL MDM SUMMARY  Suicidal ideations    CLINICAL IMPRESSION    SNOMED CT(R)   1. Suicidal ideations  SUICIDAL THOUGHTS   2. Emotional crisis  EMOTIONAL UPSET         DISPOSITON:     Discharge            **This document was created with the assistance of voice-to-text technology. Effort has been made to minimize transcription errors. Please allow for homonyms and other similar transcription errors, such as she in place of he and vice versa.Beola Cord, MD  02/26/16 [phone]

## 2016-02-26 ENCOUNTER — Inpatient Hospital Stay
Admit: 2016-02-26 | Discharge: 2016-02-26 | Disposition: A | Discharge status: Home / Self Care | Payer: MEDICAID | Attending: MD

## 2016-02-26 LAB — DRUG SCREEN PANEL 8
Amphetamine: NEGATIVE
Barbiturates: NEGATIVE
Benzodiazepines: NEGATIVE
Cocaine: NEGATIVE
Creatinine, Urine: 111 mg/dL (ref >20)
Marijuana: POSITIVE — AB
Methadone: NEGATIVE
Opiates: NEGATIVE
Oxycodone: NEGATIVE

## 2016-02-26 LAB — ED INFORMATION EXCHANGE

## 2016-02-26 NOTE — ED Provider Notes (Signed)
SBAR from Dr. Chales Wyatt at 12:01 AM.    For full details please see Dr. Frederik Wyatt note.  In brief the patient is a homeless male in his 54s with a history of bipolar disorder, schizoaffective, PTSD. The patient has been seen numerous times in the emergency department and has had over 100 visits in 2017. He was seen here yesterday with a complaint of abdominal pain and had a workup that reportedly was reassuring, with no concerning laboratory findings. The patient has a making suicidal statements with a potential plan to overdose on his friend's sleeping pills. Due to this he will be evaluated by [facility] mental health.    ? Any active medical issues?: No - Lab workup yesterday  ? [facility] consult pending?: Yes  ? Patient placed on Notice of Mental Illness?: No  ? Patient on Medical Hold?: No  ? Pending Studies?: No    Treatment:  Meds Last 24H (last 24 hours)     None          Results:  Results for orders placed or performed during the hospital encounter of 02/25/16 (from the past 8 hour(s))   Drug Screen Panel 8 -STAT    Collection Time: 02/25/16 10:48 PM   Result Value Ref Range    Creatinine, Urine 111 >20 mg/dL    Amphetamine Negative Negative    Barbiturates Negative Negative    Benzodiazepines Negative Negative    Cocaine Negative Negative    Marijuana Positive (A) Negative    Opiates Negative Negative    Oxycodone Negative Negative    Methadone Negative Negative       No orders to display       Reevaluation / consults:    2:50 AM. I spoke with Ronald Lenis, RN who informs me that the patient was evaluated by [facility] mental health who recommended discharging the patient, feeling that his risk to himself or others at this time was very low. As it is approximate 3 in the morning and very cold outside, and the patient is homeless, we will discharge him in the morning when it is somewhat warmer.      Medical Decision Making:  Ronald Wyatt is a 45 y.o. male who presents the emergency department for  evaluation of suicidal ideation with original potential plan to overdose on his friend's sleeping pills. He was initially evaluated and treated by Dr. Chales Wyatt, please see his note for additional details. At the time of sign out, he did not feel that the patient qualified for a notice of mental illness, as he was contracting for safety. The patient was subsequent evaluated by [facility] mental health who deemed him low risk for harm to self or others, and recommended discharge. Ultimately, the patient continues to contract for safety and will be discharged home. The patient is currently homeless and I am told, has had over 100 emergency department visits at various facilities, in 2017. This certainly raises the question of whether the patient simply was looking for a warm place to spend the night.    Disposition:  Discharge    Impression:    SNOMED CT(R)   1. Suicidal ideations  SUICIDAL THOUGHTS   2. Emotional crisis  EMOTIONAL UPSET                 NOTE:  This dictation was produced using voice recognition software. Despite concurrent proofreading, please note that homonyms and other transcription errors may be present and that these errors may  not truly reflect my intent.         Ronald Blackbird, MD  02/26/16 [phone]

## 2016-02-26 NOTE — ED Notes (Signed)
Patient was seen by [facility] crisis worker who recommended discharge to self care with a low level of risk. No suicidal ideation voiced to crisis worker by patient. Patient cooperative with discharge plan of 0600 once the buses are running and he has a place to go. AVS reviewed with and given to patient. Bus tokens given to patient. Resources on homeless shelters and [facility] given to patient. Belongings returned to patient. Patient walked out to ED lobby and shown way to bus stop.

## 2016-02-26 NOTE — Discharge Instructions (Signed)
Patient Education   Patient Education   No-harm Safety Contract   A no-harm Engineer, manufacturing systems is a written or verbal agreement between you and a mental health professional to promote safety. It contains specific actions and promises you agree to. The agreement also includes instructions from the therapist or doctor. The instructions will help prevent you from harming yourself or harming others. Harm can be as mild as pinching yourself, but can increase in intensity to actions like burning or cutting yourself. The extreme level of self-harm would be committing suicide. No-harm safety contracts are also sometimes referred to as a Charity fundraiser, suicide Financial controller, no-harm agreements or decisions, or a Engineer, manufacturing systems.   REASONS FOR NO-HARM SAFETY CONTRACTS  Safety contracts are just one part of an overall treatment plan to help keep you safe and free of harm. A safety contract may help to relieve anxiety, restore a sense of control, state clearly the alternatives to harm or suicide, and give you and your therapist or doctor a gauge for how you are doing in between visits.  Many factors impact the decision to use a no-harm safety contract and its effectiveness. A proper overall treatment plan and evaluation and good patient understanding are the keys to good outcomes.  CONTRACT ELEMENTS   A contract can range from simple to complex. They include all or some of the following:   Action statements. These are statements you agree to do or not do.  Example: If I feel my life is becoming too difficult, I agree to do the following so there is no harm to myself or others:  ? Talk with family or friends.  ? Rid myself of all things that I could use to harm myself.  ? Do an activity I enjoy or have enjoyed in the recent past.  Coping strategies. These are ways to think and feel that decrease stress, such as:  ? Use of affirmations or positive statements about self.  ? Good self-care, including improved grooming, and  healthy eating, and healthy sleeping patterns.  ? Increase physical exercise.  ? Increase social involvement.  ? Focus on positive aspects of life.  Crisis management. This would include what to do if there was trouble following the contract or an urge to harm. This might include notifying family or your therapist of suicidal thoughts. Be open and honest about suicidal urges. To prevent a crisis, do the following:  ? List reasons to reach out for support.  ? Keep contact numbers and available hours handy.  Treatment goals. These are goals would include no suicidal thoughts, improved mood, and feelings of hopefulness.  Listed responsibilities of different people involved in care. This could include family members. A family member may agree to remove firearms or other lethal weapons/substances from your ease of access.  A timeline. A timeline can be in place from one therapy session to the next session.  HOME CARE INSTRUCTIONS   ? Follow your no-harm safety contract.  ? Contact your therapist and/or doctor if you have any questions or concerns.  MAKE SURE YOU:   ? Understand these instructions.  ? Will watch your condition. Noticing any mood changes or suicidal urges.  ? Will get help right away if you are not doing well or get worse.  Document Released: 08/21/2009 Document Revised: 05/26/2011 Document Reviewed: 08/21/2009  ExitCare? Patient Information ?2015 [employer]. This information is not intended to replace advice given to you by your health care provider. Make sure you discuss  any questions you have with your health care provider.

## 2016-04-22 ENCOUNTER — Inpatient Hospital Stay
Admission: EM | Admit: 2016-04-22 | Discharge: 2016-04-23 | Disposition: A | Discharge status: Home / Self Care | Source: Other Acute Inpatient Hospital | Admitting: Emergency Medicine

## 2016-04-22 ENCOUNTER — Emergency Department: Admit: 2016-04-22

## 2016-04-22 DIAGNOSIS — R079 Chest pain, unspecified: Secondary | ICD-10-CM

## 2016-04-22 LAB — BASIC METABOLIC PANEL
Anion Gap: 13 mmol/L (ref 9–17)
BUN: 12 mg/dL (ref 6–20)
CO2: 23 mmol/L (ref 20–31)
Calcium: 8.7 mg/dL (ref 8.6–10.4)
Chloride: 104 mmol/L (ref 98–107)
Creatinine: 0.68 mg/dL — ABNORMAL LOW (ref 0.70–1.20)
GFR African American: >60 mL/min (ref >60)
GFR Non-African American: >60 mL/min (ref >60)
Glucose: 135 mg/dL — ABNORMAL HIGH (ref 70–99)
Potassium: 4.1 mmol/L (ref 3.7–5.3)
Sodium: 140 mmol/L (ref 135–144)

## 2016-04-22 LAB — D-DIMER, QUANTITATIVE: D-Dimer, Quant: 0.43 mg/L FEU

## 2016-04-22 LAB — POCT TROPONIN
POC Troponin I: 0 ng/mL (ref 0.00–0.10)
POC Troponin I: 0 ng/mL (ref 0.00–0.10)

## 2016-04-22 LAB — CBC WITH AUTO DIFFERENTIAL
Absolute Eos #: 0.17 10*3/uL (ref 0.0–0.4)
Absolute Immature Granulocyte: 0 10*3/uL (ref 0.00–0.30)
Absolute Lymph #: 1.33 10*3/uL (ref 1.0–4.8)
Absolute Mono #: 0.7 10*3/uL (ref 0.1–0.8)
Basophils Absolute: 0.06 10*3/uL (ref 0.0–0.2)
Basophils: 1 % (ref 0–2)
Eosinophils %: 3 % (ref 1–4)
Hematocrit: 36.7 % — ABNORMAL LOW (ref 40.7–50.3)
Hemoglobin: 11.1 g/dL — ABNORMAL LOW (ref 13.0–17.0)
Immature Granulocytes: 0 %
Lymphocytes: 23 % — ABNORMAL LOW (ref 24–44)
MCH: 22.1 pg — ABNORMAL LOW (ref 25.2–33.5)
MCHC: 30.2 g/dL (ref 28.4–34.8)
MCV: 73 fL — ABNORMAL LOW (ref 82.6–102.9)
MPV: 9.3 fL (ref 8.1–13.5)
Monocytes: 12 % — ABNORMAL HIGH (ref 1–7)
NRBC Automated: 0 per 100 WBC
Platelets: 405 10*3/uL (ref 138–453)
RBC: 5.03 m/uL (ref 4.21–5.77)
RDW: 21 % — ABNORMAL HIGH (ref 11.8–14.4)
Seg Neutrophils: 61 % (ref 36–66)
Segs Absolute: 3.54 10*3/uL (ref 1.8–7.7)
WBC: 5.8 10*3/uL (ref 3.5–11.3)

## 2016-04-22 MED ORDER — ASPIRIN 81 MG PO CHEW
81 MG | Freq: Once | ORAL | Status: DC
Start: 2016-04-22 — End: 2016-04-23

## 2016-04-22 MED ORDER — ONDANSETRON HCL 4 MG/2ML IJ SOLN
4 MG/2ML | Freq: Once | INTRAMUSCULAR | Status: AC
Start: 2016-04-22 — End: 2016-04-22
  Administered 2016-04-22: 4 mg via INTRAVENOUS

## 2016-04-22 MED ORDER — FENTANYL CITRATE (PF) 100 MCG/2ML IJ SOLN
100 MCG/2ML | Freq: Once | INTRAMUSCULAR | Status: DC
Start: 2016-04-22 — End: 2016-04-23

## 2016-04-22 MED ORDER — PROMETHAZINE HCL 25 MG/ML IJ SOLN
25 MG/ML | Freq: Once | INTRAMUSCULAR | Status: DC
Start: 2016-04-22 — End: 2016-04-23

## 2016-04-22 MED ORDER — FENTANYL CITRATE (PF) 100 MCG/2ML IJ SOLN
100 MCG/2ML | Freq: Once | INTRAMUSCULAR | Status: AC
Start: 2016-04-22 — End: 2016-04-22
  Administered 2016-04-22: 25 ug via INTRAVENOUS

## 2016-04-22 MED ORDER — FENTANYL CITRATE (PF) 100 MCG/2ML IJ SOLN
100 MCG/2ML | Freq: Once | INTRAMUSCULAR | Status: AC
Start: 2016-04-22 — End: 2016-04-22
  Administered 2016-04-22: 21:00:00 25 ug via INTRAVENOUS

## 2016-04-22 MED FILL — ONDANSETRON HCL 4 MG/2ML IJ SOLN: 4 MG/2ML | INTRAMUSCULAR | Qty: 2

## 2016-04-22 MED FILL — FENTANYL CITRATE (PF) 100 MCG/2ML IJ SOLN: 100 MCG/2ML | INTRAMUSCULAR | Qty: 2

## 2016-04-22 MED FILL — ASPIRIN 81 MG PO CHEW: 81 MG | ORAL | Qty: 4

## 2016-04-22 NOTE — Plan of Care (Signed)
Problem: Pain:  Goal: Pain level will decrease  Pain level will decrease   Outcome: Ongoing    Goal: Control of acute pain  Control of acute pain   Outcome: Ongoing      Problem: Nausea/Vomiting:  Goal: Absence of nausea/vomiting  Absence of nausea/vomiting  Outcome: Ongoing    Goal: Able to drink  Able to drink  Outcome: Met This Shift    Goal: Able to eat  Able to eat  Outcome: Met This Shift

## 2016-04-22 NOTE — ED Provider Notes (Signed)
Libby Maw [facility] MED SURG  Emergency Department Encounter  Emergency Medicine Resident     Pt Name: Miguel Carr  MRN: [id number]  Birthdate 1970/08/26  Date of evaluation: 04/22/16  PCP:  No primary care provider on file.    CHIEF COMPLAINT       Chief Complaint   Patient presents with   ??? Chest Pain     x30 minutes.  nitro given by EMS, but made pain worse.  324mg  asa given.        HISTORY OF PRESENT ILLNESS  (Location/Symptom, Timing/Onset, Context/Setting, Quality, Duration, Modifying Factors, Severity.)      Miguel Carr is a 46 y.o. male who presents Via EMS after being picked up for complaints of sharp left-sided chest pain with radiation up into his neck.  Patient denies any headache/change in vision, Nausea/vomiting, fever/chills, abdominal pain or any GU symptoms.  Patient has a significant past medical history for hypertension and possible previous cardiac ischemia however patient is a poor historian and no information is seen on chart review.     PAST MEDICAL / SURGICAL / SOCIAL / FAMILY HISTORY      has a past medical history of Hypertension and Myocardial infarct.     has no past surgical history on file.    Social History     Social History   ??? Marital status: Divorced     Spouse name: N/A   ??? Number of children: N/A   ??? Years of education: N/A     Occupational History   ??? Not on file.     Social History Main Topics   ??? Smoking status: Current Every Day Smoker     Packs/day: 0.50     Types: Cigarettes   ??? Smokeless tobacco: Never Used   ??? Alcohol use No   ??? Drug use: No      Comment: used to medical marijuana for back pain   ??? Sexual activity: Not on file     Other Topics Concern   ??? Not on file     Social History Narrative   ??? No narrative on file       History reviewed. No pertinent family history.    Allergies:  Toradol [ketorolac tromethamine] and Tramadol    Home Medications:  Prior to Admission medications    Medication Sig Start Date End Date Taking? Authorizing Provider   atorvastatin (LIPITOR) 20 MG  tablet Take 20 mg by mouth daily   Yes Historical Provider, MD   aspirin 81 MG chewable tablet Take 81 mg by mouth daily.   Yes Historical Provider, MD   lisinopril (PRINIVIL;ZESTRIL) 10 MG tablet Take 10 mg by mouth daily    Yes Historical Provider, MD       REVIEW OF SYSTEMS    (2-9 systems for level 4, 10 or more for level 5)      Review of Systems   Constitutional: Negative for chills and fever.   HENT: Negative for congestion, rhinorrhea and sore throat.    Eyes: Negative for discharge and redness.   Respiratory: Positive for shortness of breath. Negative for cough and chest tightness.    Cardiovascular: Positive for chest pain.   Gastrointestinal: Negative for abdominal pain, blood in stool, diarrhea, nausea and vomiting.   Endocrine: Negative for polydipsia and polyuria.   Genitourinary: Negative for dysuria and hematuria.   Musculoskeletal: Negative for neck pain and neck stiffness.   Skin: Negative for rash and wound.   Allergic/Immunologic: Negative for  immunocompromised state.   Neurological: Negative for weakness, numbness and headaches.   Hematological: Negative for adenopathy.   Psychiatric/Behavioral: Negative for agitation and behavioral problems.       PHYSICAL EXAM   (up to 7 for level 4, 8 or more for level 5)      INITIAL VITALS:   BP 121/66    Pulse 89    Temp 98.7 ??F (37.1 ??C) (Oral)    Resp 20    Wt 190 lb (86.2 kg)    SpO2 98%    BMI 25.07 kg/m??     Physical Exam   Constitutional: He is oriented to person, place, and time. He appears well-developed and well-nourished. He appears distressed.   Mild distress due to discomfort, appears anxious and agitated   HENT:   Head: Normocephalic and atraumatic.   Eyes: Conjunctivae and EOM are normal. Pupils are equal, round, and reactive to light.   Neck: Normal range of motion. Neck supple. No JVD present. No tracheal deviation present.   Cardiovascular: Normal rate, regular rhythm, normal heart sounds and intact distal pulses.    Pulmonary/Chest:  Effort normal and breath sounds normal. No stridor. No respiratory distress. He has no wheezes. He has no rales. He exhibits no tenderness.   Abdominal: Soft. Bowel sounds are normal. He exhibits no distension. There is no tenderness. There is no rebound and no guarding.   Musculoskeletal: Normal range of motion. He exhibits no edema.   Neurological: He is alert and oriented to person, place, and time. No cranial nerve deficit.   Skin: Skin is warm and dry. No rash noted.   Psychiatric: He has a normal mood and affect.       DIFFERENTIAL  DIAGNOSIS     PLAN (LABS / IMAGING / EKG):  Orders Placed This Encounter   Procedures   ??? XR CHEST STANDARD (2 VW)   ??? Basic Metabolic Panel   ??? CBC Auto Differential   ??? D-Dimer, Quantitative   ??? TSH without Reflex   ??? T4, Free   ??? T4   ??? T3, Free   ??? T3   ??? POCT troponin   ??? POCT troponin   ??? POCT troponin   ??? EKG 12 Lead   ??? PATIENT STATUS (FROM ED OR OR/PROCEDURAL) Observation       MEDICATIONS ORDERED:  Orders Placed This Encounter   Medications   ??? aspirin chewable tablet 324 mg   ??? fentaNYL (SUBLIMAZE) injection 25 mcg   ??? fentaNYL (SUBLIMAZE) injection 25 mcg   ??? ondansetron (ZOFRAN) injection 4 mg   ??? fentaNYL (SUBLIMAZE) injection 25 mcg   ??? promethazine (PHENERGAN) injection 12.5 mg       DDX: MI/CAD, angina, PE, msk chest pain, anxiety    DIAGNOSTIC RESULTS / EMERGENCY DEPARTMENT COURSE / MDM     LABS:  Results for orders placed or performed during the hospital encounter of 04/22/16   Basic Metabolic Panel   Result Value Ref Range    Glucose 135 (H) 70 - 99 mg/dL    BUN 12 6 - 20 mg/dL    CREATININE 1.610.68 (L) 0.70 - 1.20 mg/dL    Bun/Cre Ratio NOT REPORTED 9 - 20    Calcium 8.7 8.6 - 10.4 mg/dL    Sodium 096140 045135 - 409144 mmol/L    Potassium 4.1 3.7 - 5.3 mmol/L    Chloride 104 98 - 107 mmol/L    CO2 23 20 - 31 mmol/L  Anion Gap 13 9 - 17 mmol/L    GFR Non-African American >60 >60 mL/min    GFR African American >60 >60 mL/min    GFR Comment          GFR Staging NOT  REPORTED    CBC Auto Differential   Result Value Ref Range    WBC 5.8 3.5 - 11.3 k/uL    RBC 5.03 4.21 - 5.77 m/uL    Hemoglobin 11.1 (L) 13.0 - 17.0 g/dL    Hematocrit 16.1 (L) 40.7 - 50.3 %    MCV 73.0 (L) 82.6 - 102.9 fL    MCH 22.1 (L) 25.2 - 33.5 pg    MCHC 30.2 28.4 - 34.8 g/dL    RDW 09.6 (H) 04.5 - 14.4 %    Platelets 405 138 - 453 k/uL    MPV 9.3 8.1 - 13.5 fL    NRBC Automated 0.0 0.0 per 100 WBC    Differential Type NOT REPORTED     WBC Morphology NOT REPORTED     RBC Morphology NOT REPORTED     Platelet Estimate NOT REPORTED     Immature Granulocytes 0 0 %    Seg Neutrophils 61 36 - 66 %    Lymphocytes 23 (L) 24 - 44 %    Monocytes 12 (H) 1 - 7 %    Eosinophils % 3 1 - 4 %    Basophils 1 0 - 2 %    Absolute Immature Granulocyte 0.00 0.00 - 0.30 k/uL    Segs Absolute 3.54 1.8 - 7.7 k/uL    Absolute Lymph # 1.33 1.0 - 4.8 k/uL    Absolute Mono # 0.70 0.1 - 0.8 k/uL    Absolute Eos # 0.17 0.0 - 0.4 k/uL    Basophils # 0.06 0.0 - 0.2 k/uL    Morphology ANISOCYTOSIS PRESENT    D-Dimer, Quantitative   Result Value Ref Range    D-Dimer, Quant 0.43 mg/L FEU   POCT troponin   Result Value Ref Range    POC Troponin I 0.00 0.00 - 0.10 ng/mL    POC Troponin Interp       The Troponin-I (POC) results cannot be compared to the Troponin-T results.   POCT troponin   Result Value Ref Range    POC Troponin I 0.00 0.00 - 0.10 ng/mL    POC Troponin Interp       The Troponin-I (POC) results cannot be compared to the Troponin-T results.   EKG 12 Lead   Result Value Ref Range    Ventricular Rate 91 BPM    Atrial Rate 91 BPM    P-R Interval 110 ms    QRS Duration 98 ms    Q-T Interval 344 ms    QTc Calculation (Bazett) 423 ms    P Axis 62 degrees    R Axis 74 degrees    T Axis 17 degrees       IMPRESSION/EMERGENCY DEPARTMENT COURSE:    Plan is to obtain cardiac workup including d-dimer, serial troponins, EKG, chest x-ray and reevaluate.    Cardiac workup was unremarkable, chest x-ray was unremarkable for any cardiac etiology,  EKG nonspecific, neg D-dimer    Due to patient's cardiac history and continued chest pain we'll admit to observation for cardiology evaluation.    Patient is resting comfortably in no acute distress with improvement of his symptoms, currently awaiting transfer to the floor.    ED Course  RADIOLOGY:  Xr Chest Standard (2 Vw)    Result Date: 04/22/2016  EXAMINATION: TWO VIEWS OF THE CHEST 04/22/2016 4:46 pm COMPARISON: Two-view chest from 09/08/2008 HISTORY: ORDERING SYSTEM PROVIDED HISTORY: chest pain, tachycardia TECHNOLOGIST PROVIDED HISTORY: Reason for exam:->chest pain, tachycardia Acuity: Unknown Type of Exam: Unknown History of hypertension and prior MI. FINDINGS: Overlying ECG monitor leads. Cardiac silhouette nonenlarged and stable in appearance.  Mediastinal structures midline unchanged, again with some calcification aortic knob. Accentuated interstitial markings but no Kerley lines or localized pulmonary opacity.  Costophrenic angles clear. Bones appear borderline osteopenic.  Mild degenerative changes thoracic spine. Gas-filled small bowel in the upper abdomen, which appears to represent large and small bowel.  No obvious free air.  Probable fluid level in the stomach.     No acute cardiopulmonary disease. Possible ileus.  No subdiaphragmatic free air.  Correlate clinically.       EKG  EKG Interpretation    Interpreted by me    Rhythm: normal sinus   Rate: normal-91  Axis: normal  Ectopy: none  Conduction: short PR interval  ST Segments: no acute change  T Waves: non-specific changes  Q Waves: inferior leads    Clinical Impression: non-specific EKG    All EKG's are interpreted by the Emergency Department Physician who either signs or Co-signs this chart in the absence of a cardiologist.      PROCEDURES:  None    CONSULTS:  None    CRITICAL CARE:  None    FINAL IMPRESSION      1. Chest pain, unspecified type          DISPOSITION / PLAN     DISPOSITION        PATIENT REFERRED TO:  No follow-up provider  specified.    DISCHARGE MEDICATIONS:  Current Discharge Medication List          Lanette Hampshire, DO  Emergency Medicine Resident    (Please note that portions of this note were completed with a voice recognition program.  Efforts were made to edit the dictations but occasionally words are mis-transcribed.)     Lanette Hampshire, DO  04/22/16 1901

## 2016-04-22 NOTE — Care Coordination-Inpatient (Signed)
Case Management Initial Discharge Plan  Miguel Carr,         Readmission Risk              Readmission Risk:        11.25       Age 45 or Greater:  0    Admitted from SNF or Requires Paid or Family Care:  0    Currently has CHF,COPD,ARF,CRI,or is on dialysis:  0    Takes more than 5 Prescription Medications:  0    Takes Digoxin,Insulin,Anticoagulants,Narcotics or ASA/Plavix:  Veteran in Past 12 Months:  10    On Disability:  0    Patient Considers own Health:  1.25            Met with:patient to discuss discharge plans.   Information verified: address, contacts, phone number, DOB, insurance Yes  PCP: No primary care provider on file.  Date of last visit: pt does not have PCP, he is traveling by bus from [place] to [employer] Insurance Provider: none    Discharge Planning  Current Residence:  Private Residence  Living Arrangements:  [place] has 1 stories/ none stairs to climb  Support Systems:  Friends/Neighbors  Current Services PTA:  None Supplier: na  Patient able to perform ADL's:Independent  DME used to aid ambulation prior to admission: none /during admission none    Potential Assistance Needed:  Transportation    Pharmacy: pt states he does not use a Pharmacist, hospital Medications:  Yes  Does patient want to participate in local refill/ meds to beds program?  No    Patient agreeable to home care: No  Freedom of choice provided:  n/a      Type of Home Care Services:  None  Patient expects to be discharged to:  bus station    Prior SNF/Rehab Placement and Facility: no  Agreeable to SNF/Rehab: No  Freedom of choice provided: n/a  Social Services Evaluation: n/a    Expected Discharge date:     Follow Up Appointment: Best Day/ Time:      Transportation provider: pt will need transportation back to bus station  Transportation arrangements needed for discharge: Yes    Discharge Plan: to bus station        Electronically signed by Josephina Gip, RN on 04/22/16 at 6:23  PM

## 2016-04-22 NOTE — ED Provider Notes (Signed)
[facility]  Emergency Department  Faculty Attestation     I performed a history and physical examination of the patient and discussed management with the resident. I reviewed the resident???s note and agree with the documented findings and plan of care. Any areas of disagreement are noted on the chart. I was personally present for the key portions of any procedures. I have documented in the chart those procedures where I was not present during the key portions. I have reviewed the emergency nurses triage note. I agree with the chief complaint, past medical history, past surgical history, allergies, medications, social and family history as documented unless otherwise noted below.    For Physician Assistant/ Nurse Practitioner cases/documentation I have personally evaluated this patient and have completed at least one if not all key elements of the E/M (history, physical exam, and MDM). Additional findings are as noted.      Primary Care Physician:  No primary care provider on file.    Screenings:  @RWKSCREENINGS @    CHIEF COMPLAINT       Chief Complaint   Patient presents with   ??? Chest Pain     x30 minutes.  nitro given by EMS, but made pain worse.  324mg  asa given.        RECENT VITALS:   Temp: 98.7 ??F (37.1 ??C),  Pulse: 99, Resp: 15, BP: 106/76    LABS:  Labs Reviewed   BASIC METABOLIC PANEL - Abnormal; Notable for the following:        Result Value    Glucose 135 (*)     CREATININE 0.68 (*)     All other components within normal limits   CBC WITH AUTO DIFFERENTIAL - Abnormal; Notable for the following:     Hemoglobin 11.1 (*)     Hematocrit 36.7 (*)     MCV 73.0 (*)     MCH 22.1 (*)     RDW 21.0 (*)     Lymphocytes 23 (*)     Monocytes 12 (*)     All other components within normal limits   D-DIMER, QUANTITATIVE   TSH WITHOUT REFLEX   T4, FREE   T4   T3, FREE   T3   POCT TROPONIN   POCT TROPONIN   POCT TROPONIN       Radiology  XR CHEST STANDARD (2 VW)   Final Result   No acute  cardiopulmonary disease.      Possible ileus.  No subdiaphragmatic free air.  Correlate clinically.               EKG:  EKG Interpretation    Interpreted by me    Rhythm: normal sinus   Rate: normal  Axis: normal  Ectopy: none  Conduction: normal  ST Segments: no acute change  T Waves: inversion III  Q Waves: none    Clinical Impression: no acute changes and normal EKG    Attending Physician Additional  Notes    Patient has severe left pectoral chest pain radiates through to the back left jaw and left arm.  There is shortness of breath.  He has no nausea.  No palpitations.  No stroke symptoms.  He has tingling in the left leg without swelling.  No prior DVT.  He has a history of prior MI with angioplasty.  Strong family history.  On exam he is in moderate to severe distress, vital signs.  Symmetrical pulses.  No edema cords Homans or  calf tenderness.  Cardiac exam is benign.  No obvious murmur.  Lungs are clear.  Abdomen is benign.  EKG is nonspecific.  Differential includes ACS, dissection, PE.  Plan is imaging, analgesics, aspirin, nitroglycerin, d-dimer, CTA if positive.            Kayleen MemosMichael C. Orson AloePlewa, MD, [facility]  Attending Emergency  Physician                Rudene AndaMichael C Elray Dains, MD  04/22/16 [phone]

## 2016-04-22 NOTE — ED Notes (Signed)
Pt requesting pain and nausea meds.  Dr. Hyacinth MeekerMiller notified.      Katina DungLaura E Laresa Oshiro, RN  04/22/16 1750

## 2016-04-22 NOTE — ED Notes (Signed)
Bed: 29  Expected date: 04/22/16  Expected time: 3:32 PM  Means of arrival: Ambulance  Comments:  LS 11      Katina DungLaura E Zohn, RN  04/22/16 1534

## 2016-04-22 NOTE — ED Notes (Signed)
Pt is brought by EMS to room 29.  Pt reports that he was on the bus when he started having left sided chest pains.  He states that it radiated up to his jaw.  He reports history of MI.  EMS was called who gave 324mg  ASA and nitro.  Pt reported that the nitro made the pain worse.  Pt is alert, oriented, speaking in full sentences.  Cardiac workup in progress.      Katina DungLaura E Darin Redmann, RN  04/22/16 [phone]

## 2016-04-22 NOTE — Care Coordination-Inpatient (Signed)
02[place]06 [place], [place]

## 2016-04-23 LAB — TSH: TSH: 0.19 mIU/L — ABNORMAL LOW (ref 0.30–5.00)

## 2016-04-23 LAB — T3: T3, Total: 165 ng/dL (ref 80–200)

## 2016-04-23 LAB — T4, FREE: Thyroxine, Free: 1.41 ng/dL (ref 0.93–1.70)

## 2016-04-23 LAB — T3, FREE: T3, Free: 4.43 pg/mL (ref 2.02–4.43)

## 2016-04-23 LAB — T4: T4, Total: 8.6 ug/dL (ref 4.5–12.0)

## 2016-04-23 MED ORDER — ACETAMINOPHEN 325 MG PO TABS
325 | ORAL | Status: DC | PRN
Start: 2016-04-23 — End: 2016-04-23

## 2016-04-23 MED ORDER — FENTANYL CITRATE (PF) 100 MCG/2ML IJ SOLN
100 | INTRAMUSCULAR | Status: DC | PRN
Start: 2016-04-23 — End: 2016-04-23
  Administered 2016-04-23 (×3): 25 ug via INTRAVENOUS

## 2016-04-23 MED ORDER — SODIUM CHLORIDE 0.9 % IV SOLN
0.9 | INTRAVENOUS | Status: DC
Start: 2016-04-23 — End: 2016-04-23
  Administered 2016-04-23: 03:00:00 via INTRAVENOUS

## 2016-04-23 MED ORDER — ATORVASTATIN CALCIUM 20 MG PO TABS
20 MG | Freq: Every evening | ORAL | Status: DC
Start: 2016-04-23 — End: 2016-04-23
  Administered 2016-04-23: 03:00:00 20 mg via ORAL

## 2016-04-23 MED ORDER — NORMAL SALINE FLUSH 0.9 % IV SOLN
0.9 | Freq: Two times a day (BID) | INTRAVENOUS | Status: DC
Start: 2016-04-23 — End: 2016-04-23
  Administered 2016-04-23: 03:00:00 10 mL via INTRAVENOUS

## 2016-04-23 MED ORDER — LISINOPRIL 10 MG PO TABS
10 MG | Freq: Every day | ORAL | Status: DC
Start: 2016-04-23 — End: 2016-04-23

## 2016-04-23 MED ORDER — ONDANSETRON HCL 4 MG/2ML IJ SOLN
4 | Freq: Three times a day (TID) | INTRAMUSCULAR | Status: DC | PRN
Start: 2016-04-23 — End: 2016-04-23
  Administered 2016-04-23: 07:00:00 4 mg via INTRAVENOUS

## 2016-04-23 MED ORDER — NORMAL SALINE FLUSH 0.9 % IV SOLN
0.9 | INTRAVENOUS | Status: DC | PRN
Start: 2016-04-23 — End: 2016-04-23

## 2016-04-23 MED FILL — ONDANSETRON HCL 4 MG/2ML IJ SOLN: 4 MG/2ML | INTRAMUSCULAR | Qty: 2

## 2016-04-23 MED FILL — LISINOPRIL 10 MG PO TABS: 10 MG | ORAL | Qty: 1

## 2016-04-23 MED FILL — LIPITOR 20 MG PO TABS: 20 MG | ORAL | Qty: 1

## 2016-04-23 MED FILL — FENTANYL CITRATE (PF) 100 MCG/2ML IJ SOLN: 100 MCG/2ML | INTRAMUSCULAR | Qty: 2

## 2016-04-23 NOTE — Discharge Instructions (Signed)
???   Good nutrition is important when healing from an illness, injury, or surgery.  Follow any nutrition recommendations given to you during your hospital stay.   ??? If you were given an oral nutrition supplement while in the hospital, continue to take this supplement at home.  You can take it with meals, in-between meals, and/or before bedtime. These supplements can be purchased at most local grocery stores, pharmacies, and chain super-stores.   ??? If you have any questions about your diet or nutrition, call the hospital and ask for the dietitian.  General Diet

## 2016-04-23 NOTE — H&P (Signed)
[facility]  CDU / OBSERVATION eNCOUnter  Resident Note     Pt Name: Miguel Carr  MRN: [id number]  Birthdate 12-31-1970  Date of evaluation: 04/23/16  Patient's PCP is :  No primary care provider on file.    CHIEF COMPLAINT       Chief Complaint   Patient presents with   ??? Chest Pain     x30 minutes.  nitro given by EMS, but made pain worse.  324mg  asa given.          HISTORY OF PRESENT ILLNESS    Miguel Carr is a 46 y.o. male who presents With sharp left-sided chest pain that radiated to his neck.  It began abruptly yesterday.  He states that he was on the bus.  He was transferred to the ER by EMS, who gave him aspirin and nitro.  He states the nitro made his pain worse.  Patient had a negative cardiac workup in the ER, however given his history was admitted overnight for evaluation by cardiology.  This Morning patient has no complaints and states pain is resolved.    Location/Symptom: Acute left-sided chest pain  Timing/Onset: Began yesterday  Provocation: Was sitting on the bus when it started  Quality: Sharp  Radiation: Neck  Severity: Moderate  Timing/Duration: Several hours yesterday    Modifying Factors: Worsened by nitro    REVIEW OF SYSTEMS       General ROS - No fevers, No malaise   Ophthalmic ROS - No discharge, No changes in vision  ENT ROS -  No sore throat, No rhinorrhea,   Respiratory ROS - no shortness of breath, no cough, no  wheezing  Cardiovascular ROS - +chest pain, no dyspnea on exertion  Gastrointestinal ROS - No abdominal pain, no nausea or vomiting, no change in bowel habits, no black or bloody stools  Genito-Urinary ROS - No dysuria, trouble voiding, or hematuria  Musculoskeletal ROS - No myalgias, No arthalgias  Neurological ROS - No headache, no dizziness/lightheadedness, No focal weakness, no loss of sensation  Dermatological ROS - No lesions, No rash     (PQRS) Advance directives on face sheet per hospital policy. No change unless specifically mentioned in chart    PAST  MEDICAL HISTORY    has a past medical history of Hypertension and Myocardial infarct.    I have reviewed the past medical history with the patient and it is pertinent to this complaint.      SURGICAL HISTORY      has no past surgical history on file.  I have reviewed and agree with Surgical History entered    CURRENT MEDICATIONS       aspirin chewable tablet 324 mg Once   atorvastatin (LIPITOR) tablet 20 mg Nightly   lisinopril (PRINIVIL;ZESTRIL) tablet 10 mg Daily   sodium chloride flush 0.9 % injection 10 mL 2 times per day   sodium chloride flush 0.9 % injection 10 mL PRN   acetaminophen (TYLENOL) tablet 650 mg Q4H PRN   0.9 % sodium chloride infusion Continuous   fentaNYL (SUBLIMAZE) injection 25 mcg Q2H PRN   ondansetron (ZOFRAN) injection 4 mg Q8H PRN   fentaNYL (SUBLIMAZE) injection 25 mcg Once   promethazine (PHENERGAN) injection 12.5 mg Once       All medication charted and reviewed.    ALLERGIES     is allergic to toradol [ketorolac tromethamine] and tramadol.      FAMILY HISTORY     has no  family status information on file.      family history is not on file.  The patient denies any pertinent family history.  I have reviewed and agree with the family history entered.  I have reviewed the Family History and it is not significant to the case    SOCIAL HISTORY      reports that he has been smoking Cigarettes.  He has been smoking about 0.50 packs per day. He has never used smokeless tobacco. He reports that he does not drink alcohol or use drugs.  I have reviewed and agree with all Social.  There are concerns for substance abuse/use.    PHYSICAL EXAM     INITIAL VITALS:  height is 6\' 1"  (1.854 m) and weight is 184 lb 6.4 oz (83.6 kg). His oral temperature is 98.5 ??F (36.9 ??C). His blood pressure is 110/64 and his pulse is 67. His respiration is 18 and oxygen saturation is 94%.      CONSTITUTIONAL: AOx4, no apparent distress, appears Older than stated age, lying in bed, intermittently falling asleep during  exam    HEAD: normocephalic, atraumatic   EYES: EOMI    ENT: moist mucous membranes, uvula midline   NECK: supple, symmetric   LUNGS: clear to auscultation bilaterally   CARDIOVASCULAR: regular rate and rhythm, no murmurs   ABDOMEN: soft, non-tender, non-distended   NEUROLOGIC:  no focal sensory or motor deficits   MUSCULOSKELETAL: no clubbing, cyanosis or edema   SKIN: no rash or wounds       DIFFERENTIAL DIAGNOSIS/MDM:     Chest Pain:  DDX: Emergent: ACS/NSTEMI/STEMI/angina, arrhythmia, trauma, aortic dissection,  PE, PNA, pneumothroax, esophageal rupture, tamponade, Cocaine use  Nonemergent: pneumonia, pericarditis, GERD, MSK, Endocarditis, anxiety  Evaluated for: diaphoresis, present chest pain, tachypnea, BP both arms, heart sounds, JVD, tender chest wall, wheezing      DIAGNOSTIC RESULTS     EKG: All EKG's are interpreted by the Observation Physician who either signs or Co-signs this chart in the absence of a cardiologist.    EKG Interpretation    Interpreted by observation physician    Rhythm: normal sinus   Rate: normal  Axis: normal  Ectopy: none  Conduction: normal  ST Segments: no acute change  T Waves: no acute change  Q Waves: none    Clinical Impression: no acute changes    Carney Corners, MD      RADIOLOGY:   I directly visualized the following  images and reviewed the radiologist interpretations:    Xr Chest Standard (2 Vw)    Result Date: 04/22/2016  EXAMINATION: TWO VIEWS OF THE CHEST 04/22/2016 4:46 pm COMPARISON: Two-view chest from 09/08/2008 HISTORY: ORDERING SYSTEM PROVIDED HISTORY: chest pain, tachycardia TECHNOLOGIST PROVIDED HISTORY: Reason for exam:->chest pain, tachycardia Acuity: Unknown Type of Exam: Unknown History of hypertension and prior MI. FINDINGS: Overlying ECG monitor leads. Cardiac silhouette nonenlarged and stable in appearance.  Mediastinal structures midline unchanged, again with some calcification aortic knob. Accentuated interstitial markings but no Kerley lines or localized  pulmonary opacity.  Costophrenic angles clear. Bones appear borderline osteopenic.  Mild degenerative changes thoracic spine. Gas-filled small bowel in the upper abdomen, which appears to represent large and small bowel.  No obvious free air.  Probable fluid level in the stomach.     No acute cardiopulmonary disease. Possible ileus.  No subdiaphragmatic free air.  Correlate clinically.       LABS:  I have reviewed and interpreted all available lab results.  Labs  Reviewed   BASIC METABOLIC PANEL - Abnormal; Notable for the following:        Result Value    Glucose 135 (*)     CREATININE 0.68 (*)     All other components within normal limits   CBC WITH AUTO DIFFERENTIAL - Abnormal; Notable for the following:     Hemoglobin 11.1 (*)     Hematocrit 36.7 (*)     MCV 73.0 (*)     MCH 22.1 (*)     RDW 21.0 (*)     Lymphocytes 23 (*)     Monocytes 12 (*)     All other components within normal limits   TSH WITHOUT REFLEX - Abnormal; Notable for the following:     TSH 0.19 (*)     All other components within normal limits   D-DIMER, QUANTITATIVE   T4, FREE   T4   T3, FREE   T3   POCT TROPONIN   POCT TROPONIN   POCT TROPONIN   POCT TROPONIN           SCREENING TOOLS:    HEART Risk Score for Chest Pain Patients   History and Physical Exam Suspicion Level  (Nausea, Vomiting, Diaphoresis, Radiation, Exertion)   Slightly Suspicious (0 pts)   Moderately Suspicious (1 pt)   Highly Suspicious (2 pts)   EKG Interpretation   Normal (0 pts)   Non-Specific Repolarization Disturbance (1 pt)   Significant ST-Depression (2 pts)   Age of Patient (in years)   = 45 (0 pts)   46-64 (1 pt)   = 65 (2 pts)   Risk Factors   No Risk Factors (0 pts)   1-2 Risk Factors (1 pt)   = 3 Risk Factors (2 pts)   Risk Factors Include:   Hypercholesterolemia   Hypertension   Diabetes Mellitus   Cigarette smoking   Positive family history   Obesity   CAD   (SLE, CKDz, HIV, Cocaine abuse)   Troponin Levels   = Normal Limit (0 pts)   1-3 Times Normal Limit (1  pt)   > 3 Times Normal Limit (2 pts)  TOTAL: 3    Percent Risk for Major Adverse Cardiac Event (MACE)  0-3 pts indicates low risk for MACE   2.5% (DISCHARGE)   4-7 pts indicates moderate risk for MACE  20.3% (OBS)  8-10 pts indicates high risk for MACE  72.7% (EARLY INVASIVE TX)    CDU IMPRESSION / PLAN      Miguel Carr is a 46 y.o. male who presents with   -Acute left sided chest pain without radiation that began at rest, of unknown etiology however concerning for cardiac etiology, admitted for evaluation by cardiology    Patient had a negative workup in the ER, however given the patient's pain, and cardiac history, was admitted for evaluation by cardiology service.  Patient was asymptomatic this morning, and repeat EKG did not demonstrate any abnormalities.  Patient declined any workup this morning.  He states that he will follow-up when he is done traveling.  Cardiology was not to evaluate the patient prior to patient's request to leave, informed discharge was discussed with patient by Dr. Barnetta Chapel.     ?? IP CONSULT TO CARDIOLOGY  ?? Further workup and evaluation   ?? Follow up recommendations     ?? Continue home meds, pain control   ?? Monitor vitals, labs, imaging     DISPO: pending consults and clinical improvement  CONSULTS:    IP CONSULT TO CARDIOLOGY    PROCEDURES:  Not indicated       PATIENT REFERRED TO:    No follow-up provider specified.    --  Carney CornersAmye Caelie [place]emsburg, MD   Emergency Medicine Resident     This dictation was generated by voice recognition computer software.  Although all attempts are made to edit the dictation for accuracy, there may be errors in the transcription that are not intended.

## 2016-04-23 NOTE — Discharge Summary (Signed)
CDU Discharge Summary        Patient:  Miguel Carr  Date of Birth: April 16, 1970    MRN: [id number]   Acct: [id number]    Primary Care Physician: No primary care provider on file.    Admit date:  04/22/2016 04/22/2016  3:34 PM  Discharge date:  04/23/2016 04/23/2016 11:20 AM     Discharge Diagnoses:   -Acute left sided chest pain without radiation that began at rest, of unknown etiology however concerning for cardiac etiology, admitted for evaluation by cardiology, requested discharge prior to evaluation by cardiology, will follow up with cardiology as an outpatient      Discharge Medications:       [name], [name]   Home Medication Instructions [id number]    Printed on:04/23/16 1413   Medication Information                      aspirin 81 MG chewable tablet  Take 81 mg by mouth daily.             atorvastatin (LIPITOR) 20 MG tablet  Take 20 mg by mouth daily             lisinopril (PRINIVIL;ZESTRIL) 10 MG tablet  Take 10 mg by mouth daily                  Diet:        Activity:  As tolerated    Follow-up:  Call today/tomorrow for a follow up appointment with No primary care provider on file.     Consultants: None    Procedures:      Diagnostic Test:         Physical Exam:    CONSTITUTIONAL: AOx4, no apparent distress, appears Older than stated age, lying in bed, intermittently falling asleep during exam    HEAD: normocephalic, atraumatic   EYES: EOMI    ENT: moist mucous membranes, uvula midline   NECK: supple, symmetric   LUNGS: clear to auscultation bilaterally   CARDIOVASCULAR: regular rate and rhythm, no murmurs   ABDOMEN: soft, non-tender, non-distended   NEUROLOGIC:  no focal sensory or motor deficits   MUSCULOSKELETAL: no clubbing, cyanosis or edema   SKIN: no rash or wounds           Hospital Course:   Miguel Carr originally presented to the hospital on 04/22/2016  3:34 PM with acute onset of chest pain without radiation that began all patient was on the bus.  His cardiac evaluation in the ER was  unremarkable, however given his history, and risk factors was admitted for cardiology evaluation.  Patient was observed overnight without any additional complaints, however patient refused any evaluation in the morning by cardiology and states that he was on his way to [facility] and would follow up as an outpatient when he arrived there.  This was discussed with Dr. Barnetta Chapel, who discussed risks and benefits with the patient, and patient had an informed discharge. Labs and imaging were followed daily.  At time of discharge, Miguel Carr was tolerating a PO intake well, passing flatus, urinating adequately, ambulating and had adequate analgesia on oral pain medications.  He is medically stable to be discharged. Clinical course has improved. I feel the patient can be safely discharged to home with outpatient follow up.  Instructions have been given for the patient to return to the ED for any worsening of the symptoms, including but not limited to increased pain, shortness of  breath, weakness, or any deterioration of their current condition     Disposition: Home    Condition: Good      Patient stable and ready for discharge home. I have discussed plan of care with patient and they are in understanding. They were instructed to read discharge paperwork. All of their questions and concerns were addressed.     Patient states that they understand the plan and agree with the plan     Time Spent: 0        Carney CornersAmye Nezar Buckles, MD  Emergency Medicine Resident Physician

## 2016-04-23 NOTE — Plan of Care (Signed)
Problem: Pain:  Goal: Pain level will decrease  Pain level will decrease   Outcome: Ongoing    Goal: Control of acute pain  Control of acute pain   Outcome: Ongoing    Goal: Control of chronic pain  Control of chronic pain   Outcome: Ongoing      Problem: Nausea/Vomiting:  Goal: Absence of nausea/vomiting  Absence of nausea/vomiting   Outcome: Ongoing    Goal: Able to drink  Able to drink   Outcome: Ongoing    Goal: Able to eat  Able to eat   Outcome: Ongoing    Goal: Ability to achieve adequate nutritional intake will improve  Ability to achieve adequate nutritional intake will improve   Outcome: Ongoing

## 2016-04-23 NOTE — Progress Notes (Signed)
[facility]  [facility] eNC[facility]nter  Attending NOte       I performed a history and physical examination of the patient and discussed management with the resident. I reviewed the resident???s note and agree with the documented findings and plan of care. Any areas of disagreement are noted on the chart. I was personally present for the key portions of any procedures. I have documented in the chart those procedures where I was not present during the key portions. I have reviewed the nurses notes. I agree with the chief complaint, past medical history, past surgical history, allergies, medications, social and family history as documented unless otherwise noted below.    The Family history, social history, and ROS are effectively unchanged since admission unless noted elsewhere in the chart.    Pt now asymptomatic.  Feels well now and wants to leave.  Normal ekg, enzymes.  Traveling to [place] where he will establish care.  OK for discharge.  Leaving prior to cards consult with knowledge of plan.  Informed discharge.    Francena Hanlyavid J Joss Friedel MD  Attending Emergency  Physician

## 2016-04-23 NOTE — Discharge Instructions (Signed)
Up as tolerated.

## 2016-04-23 NOTE — Progress Notes (Signed)
Pt refusing vitals and medications this morning and wants to leave.

## 2016-04-24 LAB — EKG 12-LEAD
Atrial Rate: 91 {beats}/min
Atrial Rate: 68 {beats}/min
P Axis: 62 degrees
P Axis: 51 degrees
P-R Interval: 110 ms
P-R Interval: 142 ms
Q-T Interval: 344 ms
Q-T Interval: 394 ms
QRS Duration: 98 ms
QRS Duration: 104 ms
QTc Calculation (Bazett): 423 ms
QTc Calculation (Bazett): 418 ms
R Axis: 74 degrees
R Axis: 51 degrees
T Axis: 17 degrees
T Axis: 38 degrees
Ventricular Rate: 91 {beats}/min
Ventricular Rate: 68 {beats}/min

## 2016-04-30 LAB — CMP (EXT)
A/G Ratio (EXT): 1.9
ALT/SGPT (EXT): 16 U/L (ref 0–40)
AST/SGOT (EXT): 17 U/L (ref 0–37)
Albumin (EXT): 4.6 g/dL (ref 3.2–5.2)
Alkaline Phosphatase (EXT): 97 U/L (ref 39–117)
Anion Gap (EXT): 13 meq/L (ref 7–16)
BUN (EXT): 15 mg/dL (ref 6–19)
BUN/CREAT Ratio (EXT): 21.4
Bilirubin, Total (EXT): <0.2 mg/dL (ref 0.0–1.0)
CO2 (EXT): 25 meq/L (ref 21–30)
CalciumCalcium (EXT): 9.3 mg/dL (ref 8.6–10.4)
Chloride (EXT): 103 meq/L (ref 96–108)
Creatinine (EXT): 0.7 mg/dL (ref 0.50–1.30)
Globulin (EXT): 2.4 g/dL (ref 2.0–3.5)
Glucose (EXT): 118 mg/dL — ABNORMAL HIGH (ref 70–99)
Potassium (EXT): 4.2 meq/L (ref 3.3–5.3)
Protein (EXT): 7 g/dL (ref 5.9–8.4)
Sodium (EXT): 141 meq/L (ref 133–145)
eGFR - Creat MDRD (EXT): >60 (ref >60)
eGFR - Creat MDRD (EXT): >60 (ref >60)

## 2016-05-01 LAB — BMP (EXT)
Anion Gap (EXT): 12 meq/L (ref 7–16)
BUN (EXT): 15 mg/dL (ref 6–19)
BUN/CREAT Ratio (EXT): 21.4
CO2 (EXT): 24 meq/L (ref 21–30)
CalciumCalcium (EXT): 8.6 mg/dL (ref 8.6–10.4)
Chloride (EXT): 105 meq/L (ref 96–108)
Creatinine (EXT): 0.7 mg/dL (ref 0.50–1.30)
Glucose (EXT): 101 mg/dL — ABNORMAL HIGH (ref 70–99)
Potassium (EXT): 4.4 meq/L (ref 3.3–5.3)
Sodium (EXT): 141 meq/L (ref 133–145)
eGFR - Creat MDRD (EXT): >60 (ref >60)
eGFR - Creat MDRD (EXT): >60 (ref >60)

## 2016-05-01 LAB — LIPID PROFILE (EXT)
Cholesterol (EXT): 101 mg/dL — ABNORMAL LOW (ref 112–199)
HDL Cholesterol (EXT): 37 mg/dL — AB (ref 40–100)
LDL Cholesterol, CALC (EXT): 51 mg/dL — ABNORMAL LOW (ref 57–99)
Triglycerides (EXT): 65 mg/dL

## 2016-05-15 ENCOUNTER — Emergency Department: Admit: 2016-05-15 | Payer: Medicaid - Out of State

## 2016-05-15 ENCOUNTER — Inpatient Hospital Stay
Admit: 2016-05-15 | Discharge: 2016-05-15 | Disposition: A | Discharge status: Home / Self Care | Payer: Medicaid - Out of State | Attending: Emergency Medicine

## 2016-05-15 DIAGNOSIS — R0789 Other chest pain: Secondary | ICD-10-CM

## 2016-05-15 LAB — METABOLIC PANEL, COMPREHENSIVE
A-G Ratio: 1.1 (ref 1.1–2.2)
ALT (SGPT): 22 U/L (ref 12–78)
AST (SGOT): 14 U/L — ABNORMAL LOW (ref 15–37)
Albumin: 3.7 g/dL (ref 3.5–5.0)
Alk. phosphatase: 101 U/L (ref 45–117)
Anion gap: 8 mmol/L (ref 5–15)
BUN/Creatinine ratio: 19 (ref 12–20)
BUN: 14 MG/DL (ref 6–20)
Bilirubin, total: 0.2 MG/DL (ref 0.2–1.0)
CO2: 25 mmol/L (ref 21–32)
Calcium: 8.2 MG/DL — ABNORMAL LOW (ref 8.5–10.1)
Chloride: 108 mmol/L (ref 97–108)
Creatinine: 0.74 MG/DL (ref 0.70–1.30)
GFR est AA: >60 mL/min/{1.73_m2} (ref >60)
GFR est non-AA: >60 mL/min/{1.73_m2} (ref >60)
Globulin: 3.4 g/dL (ref 2.0–4.0)
Glucose: 99 mg/dL (ref 65–100)
Potassium: 3.7 mmol/L (ref 3.5–5.1)
Protein, total: 7.1 g/dL (ref 6.4–8.2)
Sodium: 141 mmol/L (ref 136–145)

## 2016-05-15 LAB — CBC WITH AUTOMATED DIFF
ABS. BASOPHILS: 0 10*3/uL (ref 0.0–0.1)
ABS. EOSINOPHILS: 0.1 10*3/uL (ref 0.0–0.4)
ABS. IMM. GRANS.: 0 10*3/uL (ref 0.00–0.04)
ABS. LYMPHOCYTES: 1.5 10*3/uL (ref 0.8–3.5)
ABS. MONOCYTES: 0.7 10*3/uL (ref 0.0–1.0)
ABS. NEUTROPHILS: 4 10*3/uL (ref 1.8–8.0)
ABSOLUTE NRBC: 0 10*3/uL (ref 0.00–0.01)
BASOPHILS: 0 % (ref 0–1)
EOSINOPHILS: 2 % (ref 0–7)
HCT: 33.1 % — ABNORMAL LOW (ref 36.6–50.3)
HGB: 10.6 g/dL — ABNORMAL LOW (ref 12.1–17.0)
IMMATURE GRANULOCYTES: 0 % (ref 0.0–0.5)
LYMPHOCYTES: 24 % (ref 12–49)
MCH: 22.8 PG — ABNORMAL LOW (ref 26.0–34.0)
MCHC: 32 g/dL (ref 30.0–36.5)
MCV: 71.3 FL — ABNORMAL LOW (ref 80.0–99.0)
MONOCYTES: 11 % (ref 5–13)
MPV: 9.2 FL (ref 8.9–12.9)
NEUTROPHILS: 63 % (ref 32–75)
NRBC: 0 PER 100 WBC
PLATELET: 361 10*3/uL (ref 150–400)
RBC: 4.64 M/uL (ref 4.10–5.70)
RDW: 21 % — ABNORMAL HIGH (ref 11.5–14.5)
WBC: 6.3 10*3/uL (ref 4.1–11.1)

## 2016-05-15 LAB — LIPASE: Lipase: 204 U/L (ref 73–393)

## 2016-05-15 LAB — POC TROPONIN-I: Troponin-I (POC): <0.04 ng/mL (ref 0.00–0.08)

## 2016-05-15 LAB — CK W/ REFLX CKMB: CK: 224 U/L (ref 39–308)

## 2016-05-15 LAB — TROPONIN I
Troponin-I, Qt.: <0.04 ng/mL (ref <0.05)
Troponin-I, Qt.: <0.04 ng/mL (ref <0.05)

## 2016-05-15 MED ORDER — SODIUM CHLORIDE 0.9 % IV
Freq: Once | INTRAVENOUS | Status: AC
Start: 2016-05-15 — End: 2016-05-15
  Administered 2016-05-15: 18:00:00 via INTRAVENOUS

## 2016-05-15 MED ORDER — MORPHINE 10 MG/ML INJ SOLUTION
10 mg/ml | Freq: Once | INTRAMUSCULAR | Status: AC
Start: 2016-05-15 — End: 2016-05-15
  Administered 2016-05-15: 18:00:00 via INTRAVENOUS

## 2016-05-15 MED ORDER — ONDANSETRON (PF) 4 MG/2 ML INJECTION
4 mg/2 mL | INTRAMUSCULAR | Status: AC
Start: 2016-05-15 — End: 2016-05-15
  Administered 2016-05-15: 17:00:00 via INTRAVENOUS

## 2016-05-15 MED ORDER — SODIUM CHLORIDE 0.9 % IJ SYRG
Freq: Once | INTRAMUSCULAR | Status: AC
Start: 2016-05-15 — End: 2016-05-15
  Administered 2016-05-15: 18:00:00 via INTRAVENOUS

## 2016-05-15 MED ORDER — IOPAMIDOL 76 % IV SOLN
370 mg iodine /mL (76 %) | Freq: Once | INTRAVENOUS | Status: AC
Start: 2016-05-15 — End: 2016-05-15
  Administered 2016-05-15: 18:00:00 via INTRAVENOUS

## 2016-05-15 MED ORDER — MORPHINE 10 MG/ML INJ SOLUTION
10 mg/ml | Freq: Once | INTRAMUSCULAR | Status: AC
Start: 2016-05-15 — End: 2016-05-15
  Administered 2016-05-15: 17:00:00 via INTRAVENOUS

## 2016-05-15 MED FILL — BD POSIFLUSH NORMAL SALINE 0.9 % INJECTION SYRINGE: INTRAMUSCULAR | Qty: 10

## 2016-05-15 MED FILL — ISOVUE-370  76 % INTRAVENOUS SOLUTION: 370 mg iodine /mL (76 %) | INTRAVENOUS | Qty: 100

## 2016-05-15 MED FILL — MORPHINE 10 MG/ML IJ SOLN: 10 mg/mL | INTRAMUSCULAR | Qty: 1

## 2016-05-15 MED FILL — SODIUM CHLORIDE 0.9 % IV: INTRAVENOUS | Qty: 1000

## 2016-05-15 MED FILL — ONDANSETRON (PF) 4 MG/2 ML INJECTION: 4 mg/2 mL | INTRAMUSCULAR | Qty: 4

## 2016-05-15 NOTE — ED Notes (Signed)
Milk, orange juice, graham crackers, and PB provided to pt.

## 2016-05-15 NOTE — ED Notes (Signed)
Assumed care of pt from EMS.  Pt reports yesterday at 130pm he was having stroke like symptoms such as slurred speech blurred vision, right side of head was painful, massive headache, left sided defecit, nonresponive, pt was about to board a train when he collapsed and then rushed to hospital.  All findings were (-) yesterday. No chest pain.  Pt reports severe left sided pain jaw left arm and below shoulder pain 10/10.  Took 4 81mg  asa at 1042. 3 rounds of nitro given by EMS, no relief of any pain.  No nitropaste has been applied.  Pt is having shortness of breath, dizzy and extremely nauseous.   Denies blurred vision but massive headache from nitro.

## 2016-05-15 NOTE — Progress Notes (Signed)
Cm met pt and assisted with cab transportation to [address], [place] [address].pt said he just moved from [place] and he in his transition process.pt is staying with his friend Jenny Reichmann. 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 Called yellow cab voucher given to front desk registration staff.ETA 30 minutes.    Mcarthur Rossetti MSW  ED Case Manager   Ext [phone]

## 2016-05-15 NOTE — ED Notes (Signed)
Discussed lab results with pt.  Pt reports pain is 3/10.  Will redraw troponin at time.  Pt is questioning if he can eat or drink.

## 2016-05-15 NOTE — ED Notes (Signed)
Pt reports pain is  a 3/10 of left chest wall to his left shoulder blade.  No jaw pain or diaphoresis noted.

## 2016-05-15 NOTE — ED Notes (Signed)
Pt back from imaging

## 2016-05-15 NOTE — ED Notes (Signed)
MD Termeer reviewed discharge instructions with the patient.  The patient verbalized understanding.    Case mgmt called to assist pt to get a ride back home.

## 2016-05-15 NOTE — ED Notes (Signed)
Pt transported to imaging now.

## 2016-05-15 NOTE — ED Notes (Signed)
Warm blanket provided to pt.

## 2016-05-15 NOTE — ED Provider Notes (Signed)
EMERGENCY DEPARTMENT HISTORY AND PHYSICAL EXAM      Date: 05/15/2016  Patient Name: Miguel Carr    History of Presenting Illness     Chief Complaint   Patient presents with   ??? Chest Pain     severe chest pain since 1042, 4 58m, 3 rounds of nitro, not effective       History Provided By: Patient    HPI: Miguel Carr 46y.o. male with PMHx significant for HTN and MI, presents via EMS to the ED with cc of constant, 9/10 CP x ~1 hour PTA today. Pt describes his pain as "pressure" and "squeezing," and reports associated HA and nausea. He denies factors which improve or exacerbate his pain. Pt reports hx of similar symptoms. He states he took ASA 81 mg x4 PTA, and was given NTG x3 en route. Pt otherwise denies taking any medications, and denies taking pain medications on a regular basis. He denies hx of cardiac stent placement. Pt denies currently being followed by a Cardiologist. He reports family hx of MI. Pt denies hx of appendectomy or cholecystectomy. [place] possible [place], and had negative MRI at that time. Pt notes he is allergic to Tramadol and Toradol. He specifically denies fever, chills, vomiting, or diarrhea.    PCP: None   Family HX: dad with mi at 89   There are no other complaints, changes, or physical findings at this time.    Current Facility-Administered Medications   Medication Dose Route Frequency Provider Last Rate Last Dose   ??? morphine injection 8 mg  8 mg IntraVENous ONCE Alletta Mattos L. Marialuisa Basara, MD         Current Outpatient Prescriptions   Medication Sig Dispense Refill   ??? aspirin 81 mg chewable tablet Take 81 mg by mouth daily.     ??? atorvastatin (LIPITOR) 20 mg tablet Take  by mouth daily.     ??? lisinopril (PRINIVIL, ZESTRIL) 10 mg tablet Take  by mouth daily.     ??? nitroglycerin (NITROSTAT) 0.4 mg SL tablet Take 1 Tab by mouth every five (5) minutes as needed for Chest Pain. Sit/Lay down then put one tab  under the tongue every 5 minutes as needed for chest pain for 3 doses 1 Bottle 0       Past History     Past Medical History:  Past Medical History:   Diagnosis Date   ??? Hypertension    ??? Myocardial infarct ([facility] 2013       Past Surgical History:  No past surgical history on file.    Family History:  No family history on file.    Social History:  Social History   Substance Use Topics   ??? Smoking status: Never Smoker   ??? Smokeless tobacco: Not on file   ??? Alcohol use No       Allergies:  Allergies   Allergen Reactions   ??? Toradol [Ketorolac Tromethamine] Rash   ??? Tramadol Rash         Review of Systems   Review of Systems   Constitutional: Negative for activity change, appetite change, chills, fever and unexpected weight change.   HENT: Negative for congestion.    Eyes: Negative for pain and visual disturbance.   Respiratory: Negative for cough and shortness of breath.    Cardiovascular: Positive for chest pain.   Gastrointestinal: Positive for nausea. Negative for abdominal pain, diarrhea and  vomiting.   Genitourinary: Negative for dysuria.   Musculoskeletal: Negative for back pain.   Skin: Negative for rash.   Neurological: Positive for headaches.       Physical Exam   Physical Exam   Constitutional: He is oriented to person, place, and time. He appears well-developed and well-nourished.   Middle aged male, writhing in pain   HENT:   Head: Normocephalic and atraumatic.   Mouth/Throat: Oropharynx is clear and moist.   Eyes: Conjunctivae and EOM are normal. Pupils are equal, round, and reactive to light. Right eye exhibits no discharge. Left eye exhibits no discharge.   Neck: Normal range of motion. Neck supple.   Cardiovascular: Normal rate, regular rhythm and normal heart sounds.    No murmur heard.  Pulmonary/Chest: Effort normal and breath sounds normal. No respiratory distress. He has no wheezes. He has no rales.   Abdominal: Soft. Bowel sounds are normal. He exhibits no distension. There is no tenderness.    Pulsatile mass mid ABD, unable to determine borders   Musculoskeletal: Normal range of motion. He exhibits no edema.   Neurological: He is alert and oriented to person, place, and time. No cranial nerve deficit. He exhibits normal muscle tone.   Skin: Skin is warm and dry. No rash noted. He is not diaphoretic. There is pallor.   Psychiatric: His mood appears anxious.   Nursing note and vitals reviewed.        Diagnostic Study Results     Labs -     Recent Results (from the past 12 hour(s))   EKG, 12 LEAD, INITIAL    Collection Time: 05/15/16 11:18 AM   Result Value Ref Range    Ventricular Rate 93 BPM    Atrial Rate 93 BPM    P-R Interval 124 ms    QRS Duration 92 ms    Q-T Interval 358 ms    QTC Calculation (Bezet) 445 ms    Calculated P Axis 69 degrees    Calculated R Axis 54 degrees    Calculated T Axis 31 degrees    Diagnosis       Sinus rhythm with marked sinus arrhythmia  Possible Left atrial enlargement  When compared with ECG of 06-Jun-2014 19:48,  No significant change was found     CBC WITH AUTOMATED DIFF    Collection Time: 05/15/16 11:28 AM   Result Value Ref Range    WBC 6.3 4.1 - 11.1 K/uL    RBC 4.64 4.10 - 5.70 M/uL    HGB 10.6 (L) 12.1 - 17.0 g/dL    HCT 33.1 (L) 36.6 - 50.3 %    MCV 71.3 (L) 80.0 - 99.0 FL    MCH 22.8 (L) 26.0 - 34.0 PG    MCHC 32.0 30.0 - 36.5 g/dL    RDW 21.0 (H) 11.5 - 14.5 %    PLATELET 361 150 - 400 K/uL    MPV 9.2 8.9 - 12.9 FL    NRBC 0.0 0 PER 100 WBC    ABSOLUTE NRBC 0.00 0.00 - 0.01 K/uL    NEUTROPHILS 63 32 - 75 %    LYMPHOCYTES 24 12 - 49 %    MONOCYTES 11 5 - 13 %    EOSINOPHILS 2 0 - 7 %    BASOPHILS 0 0 - 1 %    IMMATURE GRANULOCYTES 0 0.0 - 0.5 %    ABS. NEUTROPHILS 4.0 1.8 - 8.0 K/UL    ABS. LYMPHOCYTES 1.5 0.8 -  3.5 K/UL    ABS. MONOCYTES 0.7 0.0 - 1.0 K/UL    ABS. EOSINOPHILS 0.1 0.0 - 0.4 K/UL    ABS. BASOPHILS 0.0 0.0 - 0.1 K/UL    ABS. IMM. GRANS. 0.0 0.00 - 0.04 K/UL    DF AUTOMATED      RBC COMMENTS ANISOCYTOSIS  2+        RBC COMMENTS OVALOCYTES  PRESENT        METABOLIC PANEL, COMPREHENSIVE    Collection Time: 05/15/16 11:28 AM   Result Value Ref Range    Sodium 141 136 - 145 mmol/L    Potassium 3.7 3.5 - 5.1 mmol/L    Chloride 108 97 - 108 mmol/L    CO2 25 21 - 32 mmol/L    Anion gap 8 5 - 15 mmol/L    Glucose 99 65 - 100 mg/dL    BUN 14 6 - 20 MG/DL    Creatinine 0.74 0.70 - 1.30 MG/DL    BUN/Creatinine ratio 19 12 - 20      GFR est AA >60 >60 ml/min/1.82m    GFR est non-AA >60 >60 ml/min/1.752m   Calcium 8.2 (L) 8.5 - 10.1 MG/DL    Bilirubin, total 0.2 0.2 - 1.0 MG/DL    ALT (SGPT) 22 12 - 78 U/L    AST (SGOT) 14 (L) 15 - 37 U/L    Alk. phosphatase 101 45 - 117 U/L    Protein, total 7.1 6.4 - 8.2 g/dL    Albumin 3.7 3.5 - 5.0 g/dL    Globulin 3.4 2.0 - 4.0 g/dL    A-G Ratio 1.1 1.1 - 2.2     CK W/ REFLX CKMB    Collection Time: 05/15/16 11:28 AM   Result Value Ref Range    CK 224 39 - 308 U/L   TROPONIN I    Collection Time: 05/15/16 11:28 AM   Result Value Ref Range    Troponin-I, Qt. <0.04 <0.05 ng/mL   LIPASE    Collection Time: 05/15/16 11:28 AM   Result Value Ref Range    Lipase 204 73 - 393 U/L   POC TROPONIN-I    Collection Time: 05/15/16 11:44 AM   Result Value Ref Range    Troponin-I (POC) <0.04 0.00 - 0.08 ng/mL       Radiologic Studies -   CT Results  (Last 48 hours)               05/15/16 1250  CTA CHEST W OR W WO CONT Final result    Impression:  IMPRESSION: No dissection or other acute finding to correlate with pain.       1.  Normal appearance to the thoracic, abdominal, and pelvic vasculature with no   aneurysm or dissection.       2. Incidental findings as above.           Narrative:  EXAM: CTA CHEST, ABDOMEN, AND PELVIS       CLINICAL HISTORY: Acute nonspecific severe chest pain since 10:40 2:00 AM not   responsive to aspirin or nitroglycerin. Low probability for CAD. Normal EKG.    Clinical concern for dissection.       COMPARISON: Chest x-ray 06/06/2014.       TECHNIQUE: CTA of the chest, abdomen, and pelvis performed with helical 2.5 mm    axial reconstructions. Sagittal and coronal 2.5 mm reformatted imaging was   performed. Thin section and thick section MIP sagittal and coronal  reconstructions and manual post-processing of the images with 3-D volume   rotating MIP projections of the vascular tree generated. Standard dose   modulation was utilized to reduce the overall radiation dose administered to the   patient.       CONTRAST:  100 cc Isovue-370 IV.       FINDINGS:        The aortic arch shows normal branch pattern with normally enhancing visualized   portions of the innominate, right subclavian, right common carotid, left common   carotid, left subclavian, and vertebral arteries. The descending thoracic aorta   opacifies normally with no dissection or aneurysm.       The heart is normal in size without pericardial effusion. Pulmonary arteries   normal in caliber with no identified pulmonary embolus.        Within the abdomen the aorta is normal in caliber without aneurysm or   dissection. There is normal opacification of the celiac axis, superior   mesenteric artery, renal arteries, and inferior mesenteric artery. There is   normal position of the common and internal and external iliacs bilaterally to   the femoral arteries.        The lungs are clear bilaterally. There is no axillary, mediastinal or hilar   lymphadenopathy. Pleural spaces are normal. Heart is of normal size and there is   no pericardial effusion.       The liver, spleen, adrenals, kidneys, pancreas and gallbladder are normal. There   is no mesenteric or retroperitoneal lymphadenopathy. No thickened or dilated   loop of large or small bowel is visualized. The appendix is normal. There is no   free intraperitoneal gas or fluid.       Urinary bladder is partially filled and grossly normal. The seminal vesicles and   prostate appear normal. There is no pelvic lymphadenopathy.       The surrounding musculoskeletal structures and soft tissue structures appear   unremarkable.            05/15/16 1250  CTA ABDOMEN PELV W CONT Final result    Impression:  IMPRESSION: No dissection or other acute finding to correlate with pain.       1.  Normal appearance to the thoracic, abdominal, and pelvic vasculature with no   aneurysm or dissection.       2. Incidental findings as above.           Narrative:  EXAM: CTA CHEST, ABDOMEN, AND PELVIS       CLINICAL HISTORY: Acute nonspecific severe chest pain since 10:40 2:00 AM not   responsive to aspirin or nitroglycerin. Low probability for CAD. Normal EKG.    Clinical concern for dissection.       COMPARISON: Chest x-ray 06/06/2014.       TECHNIQUE: CTA of the chest, abdomen, and pelvis performed with helical 2.5 mm   axial reconstructions. Sagittal and coronal 2.5 mm reformatted imaging was   performed. Thin section and thick section MIP sagittal and coronal   reconstructions and manual post-processing of the images with 3-D volume   rotating MIP projections of the vascular tree generated. Standard dose   modulation was utilized to reduce the overall radiation dose administered to the   patient.       CONTRAST:  100 cc Isovue-370 IV.       FINDINGS:        The aortic arch shows normal branch pattern with normally enhancing visualized   portions of  the innominate, right subclavian, right common carotid, left common   carotid, left subclavian, and vertebral arteries. The descending thoracic aorta   opacifies normally with no dissection or aneurysm.       The heart is normal in size without pericardial effusion. Pulmonary arteries   normal in caliber with no identified pulmonary embolus.        Within the abdomen the aorta is normal in caliber without aneurysm or   dissection. There is normal opacification of the celiac axis, superior   mesenteric artery, renal arteries, and inferior mesenteric artery. There is   normal position of the common and internal and external iliacs bilaterally to   the femoral arteries.         The lungs are clear bilaterally. There is no axillary, mediastinal or hilar   lymphadenopathy. Pleural spaces are normal. Heart is of normal size and there is   no pericardial effusion.       The liver, spleen, adrenals, kidneys, pancreas and gallbladder are normal. There   is no mesenteric or retroperitoneal lymphadenopathy. No thickened or dilated   loop of large or small bowel is visualized. The appendix is normal. There is no   free intraperitoneal gas or fluid.       Urinary bladder is partially filled and grossly normal. The seminal vesicles and   prostate appear normal. There is no pelvic lymphadenopathy.       The surrounding musculoskeletal structures and soft tissue structures appear   unremarkable.                   Medical Decision Making   I am the first provider for this patient.    I reviewed the vital signs, available nursing notes, past medical history, past surgical history, family history and social history.    Vital Signs-Reviewed the patient's vital signs.  Patient Vitals for the past 12 hrs:   Temp Pulse Resp BP SpO2   05/15/16 1400 - 92 14 96/73 99 %   05/15/16 1345 - 87 18 129/69 98 %   05/15/16 1330 - 78 17 120/69 97 %   05/15/16 1315 - 78 13 112/68 98 %   05/15/16 1313 - 73 13 110/64 97 %   05/15/16 1305 - 85 19 - 100 %   05/15/16 1230 - 78 14 103/60 98 %   05/15/16 1215 - 90 17 125/66 98 %   05/15/16 1208 - 80 12 95/71 99 %   05/15/16 1145 - 89 13 - 98 %   05/15/16 1138 - 95 - - -   05/15/16 1136 - 83 13 115/68 96 %   05/15/16 1131 - 91 13 - 96 %   05/15/16 1126 - - - 130/76 96 %   05/15/16 1125 - 100 12 - -   05/15/16 1122 - - - 130/76 -   05/15/16 1120 99.1 ??F (37.3 ??C) - 16 - -       Pulse Oximetry Analysis - 96% on RA    Cardiac Monitor:   Rate: 93 bpm  Rhythm: Sinus Rhythm with marked arrhythmia    EKG interpretation: 11:18  Rhythm: sinus rhythm with marked sinus arrhythmia. Rate (approx.): 93; Axis: normal; PR interval: normal; QRS interval: normal ; ST/T wave:  normal; Other findings: possible left atrial enlargement.    Records Reviewed: Nursing Notes and Old Medical Records    Provider Notes (Medical Decision Making):     Presented to outside hospital yesterday with stroke  symptoms; MRI negative. Now presenting with severe CP, with concern for possible dissection    ED Course:   Initial assessment performed. The patients presenting problems have been discussed, and they are in agreement with the care plan formulated and outlined with them.  I have encouraged them to ask questions as they arise throughout their visit.    1:02 PM  Pt re-evaluated, states his pain has improved to 6/10, requesting additional pain medication. Updated on available results.    2:05 PM  Pt re-evaluated, reviewed results. CT negative for acute abnormality, stable for discharge but discussed return precaution and follow up for stress testing.     Disposition:  DISCHARGE NOTE:  2:05 PM  The patient is ready for discharge. The patient???s signs, symptoms, diagnosis, and instructions for discharge have been discussed and the pt has conveyed their understanding. The patient is to follow up as recommended or return to the ER should their symptoms worsen. Plan has been discussed and patient has conveyed their agreement.    PLAN:  1.   Follow-up Information     Follow up With Details Comments Contact Info    [facility] EMERGENCY DEPT  If symptoms worsen [place]  [facility] Call to schedule an appointment for stress testing [place]  [phone]        Return to ED if worse     Diagnosis     Clinical Impression:   1. Atypical chest pain        Attestations:    This note is prepared by Malon Kindle, acting as Education administrator for [employer]. Dempsey Ahonen, MD.    Stephani Police. Ebrahim Deremer, MD: The scribe's documentation has been prepared under my direction and personally reviewed by me in its entirety. I  confirm that the note above accurately reflects all work, treatment, procedures, and medical decision making performed by me.

## 2016-05-15 NOTE — ED Notes (Signed)
Pt inquiring if he will need to let his roommate know to leave a key out for him or not. I advised we need to await ct scans and it is hard to determine the status.  He then asked if he was going to get a stress test done because he hasn't had one before. I advised a cardiologist has to be consulted for that.

## 2016-05-15 NOTE — ED Notes (Signed)
Nausea has subsided.

## 2016-05-16 LAB — EKG 12-LEAD
Atrial Rate: 93 {beats}/min
Diagnosis: NORMAL
P Axis: 69 degrees
P-R Interval: 124 ms
Q-T Interval: 358 ms
QRS Duration: 92 ms
QTc Calculation (Bazett): 445 ms
R Axis: 54 degrees
T Axis: 31 degrees
Ventricular Rate: 93 {beats}/min

## 2016-05-16 LAB — EKG, 12 LEAD, INITIAL
Atrial Rate: 93 {beats}/min
Calculated P Axis: 69 degrees
Calculated R Axis: 54 degrees
Calculated T Axis: 31 degrees
Diagnosis: NORMAL
P-R Interval: 124 ms
Q-T Interval: 358 ms
QRS Duration: 92 ms
QTC Calculation (Bezet): 445 ms
Ventricular Rate: 93 {beats}/min

## 2016-10-04 DIAGNOSIS — R45851 Suicidal ideations: Secondary | ICD-10-CM

## 2016-10-05 ENCOUNTER — Inpatient Hospital Stay
Admit: 2016-10-05 | Discharge: 2016-10-05 | Disposition: A | Discharge status: Other Acute Inpatient Hospital | Payer: MEDICAID | Source: Home / Self Care

## 2016-10-05 ENCOUNTER — Ambulatory Visit: Payer: MEDICAID

## 2016-10-14 ENCOUNTER — Encounter (HOSPITAL_COMMUNITY): Payer: Self-pay

## 2016-10-14 ENCOUNTER — Emergency Department
Admission: EM | Admit: 2016-10-14 | Discharge: 2016-10-14 | Disposition: A | Discharge status: Home / Self Care | Payer: MEDICAID | Attending: Emergency Medicine | Admitting: Emergency Medicine

## 2016-10-14 ENCOUNTER — Emergency Department (EMERGENCY_DEPARTMENT_HOSPITAL): Payer: MEDICAID

## 2016-10-14 DIAGNOSIS — I249 Acute ischemic heart disease, unspecified: Secondary | ICD-10-CM | POA: Insufficient documentation

## 2016-10-14 DIAGNOSIS — R079 Chest pain, unspecified: Secondary | ICD-10-CM

## 2016-10-14 DIAGNOSIS — I252 Old myocardial infarction: Secondary | ICD-10-CM | POA: Insufficient documentation

## 2016-10-14 DIAGNOSIS — Y9301 Activity, walking, marching and hiking: Secondary | ICD-10-CM | POA: Insufficient documentation

## 2016-10-14 DIAGNOSIS — I10 Essential (primary) hypertension: Secondary | ICD-10-CM | POA: Insufficient documentation

## 2016-10-14 DIAGNOSIS — I700 Atherosclerosis of aorta: Secondary | ICD-10-CM

## 2016-10-14 LAB — CBC WITH DIFF, BLOOD
ANC-Automated: 4.6 10*3/uL (ref 1.6–7.0)
Abs Eosinophils: 0.1 10*3/uL (ref 0.1–0.5)
Abs Lymphs: 1.6 10*3/uL (ref 0.8–3.1)
Abs Monos: 0.5 10*3/uL (ref 0.2–0.8)
Eosinophils: 1 %
Hct: 33.7 % — ABNORMAL LOW (ref 40.0–50.0)
Hgb: 10.5 gm/dL — ABNORMAL LOW (ref 13.7–17.5)
Lymphocytes: 24 %
MCH: 22.2 pg — ABNORMAL LOW (ref 26.0–32.0)
MCHC: 31.2 g/dL — ABNORMAL LOW (ref 32.0–36.0)
MCV: 71.1 um3 — ABNORMAL LOW (ref 79.0–95.0)
MPV: 9.1 fL — ABNORMAL LOW (ref 9.4–12.4)
Monocytes: 7 %
Plt Count: 325 10*3/uL (ref 140–370)
RBC: 4.74 10*6/uL (ref 4.60–6.10)
RDW: 20.7 % — ABNORMAL HIGH (ref 12.0–14.0)
Segs: 67 %
WBC: 6.8 10*3/uL (ref 4.0–10.0)

## 2016-10-14 LAB — UR DRUGS OF ABUSE SCREEN
Amphetamines Screen: NEGATIVE
Barbiturates Screen: NEGATIVE
Benzodiazepine Screen: NEGATIVE
Cocaine Screen: NEGATIVE
Methadone Screen: NEGATIVE
Opiates Screen: NEGATIVE
Oxycodone Screen: NEGATIVE
Phencyclidine Screen: NEGATIVE

## 2016-10-14 LAB — COMPREHENSIVE METABOLIC PANEL, BLOOD
ALT (SGPT): 16 U/L (ref 0–41)
AST (SGOT): 18 U/L (ref 0–40)
Albumin: 3.9 g/dL (ref 3.5–5.2)
Alkaline Phos: 98 U/L (ref 40–129)
Anion Gap: 12 mmol/L (ref 7–15)
BUN: 18 mg/dL (ref 6–20)
Bicarbonate: 23 mmol/L (ref 22–29)
Bilirubin, Tot: 0.3 mg/dL (ref <1.2)
Calcium: 9 mg/dL (ref 8.5–10.6)
Chloride: 104 mmol/L (ref 98–107)
Creatinine: 0.78 mg/dL (ref 0.67–1.17)
GFR: >60 mL/min
Glucose: 116 mg/dL — ABNORMAL HIGH (ref 70–99)
Potassium: 4.5 mmol/L (ref 3.5–5.1)
Sodium: 139 mmol/L (ref 136–145)
Total Protein: 6.6 g/dL (ref 6.0–8.0)

## 2016-10-14 LAB — TROPONIN T GEN 5 W/REFLEX TO CK/CKMB
Troponin T Gen 5 w/Reflex CK/CKMB: 7 ng/L (ref <22)
Troponin T Gen 5 w/Reflex CK/CKMB: 7 ng/L (ref <22)

## 2016-10-14 LAB — MAGNESIUM, BLOOD: Magnesium: 2.1 mg/dL (ref 1.6–2.6)

## 2016-10-14 MED ORDER — ACETAMINOPHEN 325 MG PO TABS
975.0000 mg | ORAL_TABLET | Freq: Once | ORAL | Status: AC
Start: 2016-10-14 — End: 2016-10-14
  Administered 2016-10-14: 975 mg via ORAL
  Filled 2016-10-14: qty 3

## 2016-10-14 MED ORDER — LISINOPRIL 10 MG OR TABS: 10.00 mg | ORAL_TABLET | Freq: Every evening | ORAL | Status: AC

## 2016-10-14 NOTE — ED Notes (Signed)
Pt to chest xray.

## 2016-10-14 NOTE — ED Notes (Signed)
10/14/2016 7:42 PM [name] [name] [name] [name]    An EKG was handed to Dr. Rob BuntingStorey with prior. EKG placed in ECG binder.

## 2016-10-14 NOTE — ED MD Progress Note (Signed)
Workup Review     Troponin negative x2, chest x-ray unremarkable.  Patient requested a 3rd [employer] sandwich during his stay.  prior Care everywhere shows multiple visits to out [place] and otherwise turned states for similar complaints including chest pain and suicidality.  No SI currently.

## 2016-10-14 NOTE — ED Notes (Signed)
Bed: 21  Expected date:   Expected time:   Means of arrival:   Comments:

## 2016-10-14 NOTE — ED Notes (Signed)
Pt standing at door stating he is ready for DC.  VSS. Will update ER MD.

## 2016-10-14 NOTE — ED Notes (Signed)
Patient is cleared for discharge by MD  2 patient identifiers used.   ID wrist band removed  Patient discharged in stable condition.   All belongings with patient  DCI including ff up instructions and return precautions provided and verbalized understanding of instructions.  Ambulatory with steady gait out of ED lobby

## 2016-10-14 NOTE — ED Notes (Signed)
Pt requesting food.  MD ok for pt to eat.  Pt given snacks at this time.

## 2016-10-14 NOTE — Interdisciplinary (Signed)
Social Service Note:     SW asked by RN to see this pt near time of DC because he is requesting a bus pass.  MD is clearing pt for DC.    Upon contact with this pt, he was offered to discuss resources or other SW needs and he declines "Nah, just the pass".      He asks where another hospital as he is new to town.  Then states that he is going downtown and plans to travel to [place] tomorrow on "[employer] [place]".     He denied having other SW needs and wanted to be DCed.      MD/RN aware.

## 2016-10-14 NOTE — ED Notes (Signed)
BS US

## 2016-10-14 NOTE — ED Provider Notes (Signed)
Patient seen and evaluated by me after reviewing triage RN note and recent EPIC medical records.     CC:  Chief Complaint   Patient presents with   . Chest Pain - Adult     pt BIBM for CP while walking to the trolley. pt c/o sharp substernal unprovoked CP. medics gave 1 SL nitro. pain decreased from 9/10 to 8/10. pt was also given 324mg  aspirin, 4mg  ODT zofran, ntiro paste and another SL nitro. pt also got of NS for SBP 90s. now pts SBP 110. HR 93 NSR, 02 98% RA. aox4.        History of Present illness  [name] [name] is a 46 year old male pmhx stated below who comes in with acute chest pain at the trolley today. This occurred around 6:30pm. He says it was a squeezing pain that radiated to his left arm and neck, it was 8/10. He had accompanied shortness of breath and states he had near syncope and security called EMS. He denies any chest pain on exertion of late. 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 He endorses a family history of MI in his father as well. He has no cough, fever, chills, abdominal pain, nausea, vomiting, diarrhea.     In chart review he has many ED visits for chest pain with negative work-ups including a visit in [facility] 6 months ago where he received a negative cath.     Past Medical History  Past Medical History:   Diagnosis Date   . ACS (acute coronary syndrome) (CMS-HCC)    . Heart murmur    . Hypertension    . Myocardial infarction        Past Surgical History  Past Surgical History:   Procedure Laterality Date   . back surgery     . knee surgery     . surgery to the skull  from fracture       Current Medications  No current facility-administered medications on file prior to encounter.      Current Outpatient Prescriptions on File Prior to Encounter   Medication Sig Dispense Refill   . aspirin 81 MG chewable tablet Take 1 tablet by mouth daily. 30 tablet 0   . atorvastatin (LIPITOR) 20 MG tablet Take 1 tablet by mouth every evening. 30 tablet 0          Known Allergies  Allergies   Allergen Reactions   . Toradol Hives   . Tramadol Hives       Family History  Family History   Problem Relation Age of Onset   . Heart Attack Father        Social History  Social History     Social History   . Marital status: Single     Spouse name: N/A   . Number of children: N/A   . Years of education: N/A     Occupational History   . Not on file.     Social History Main Topics   . Smoking status: Current Every Day Smoker     Packs/day: 0.25     Years: 25.00   . Smokeless tobacco: Never Used   . Alcohol use No   . Drug use: Not on file   . Sexual activity: Not on file     Other Topics Concern   . Not on file     Social History Narrative  Review of Systems  Constitutional: per HPI  HENT: Negative for rhinnorhea or congestion   Eyes: Negative for photophobia, eye pain or blurry vision  Respiratory: per HPI  Cardiovascular: per HPI  Gastrointestinal: Negative for abdominal pain, nausea or vomiting.   Genitourinary: Negative for dysuria, or flank pain  Musculoskeletal: Negative for back pain or joint swelling/pain.[name] [name].   Neurological: Negative for headache, focal weakness or numbness.   Skin: Negative for rash or itching  Psychiatric/Behavioral: Negative for hallucinations or confusion     Physical Exam   10/14/16  1905   BP: 121/67   Pulse: 90   Resp: 18   Temp: 98.7 F (37.1 C)   SpO2: 100%        Nursing note and vitals reviewed.  Within normal limits.  Gen: Not in acute distress, nontoxic. Poor hygiene.   Head: Atraumatic  Eyes: Normal conjunctiva, equal pupils  ENT: MMM, OP clear, normal nares  Neck: Supple, no stridor.   Pulm: No respiratory distress, CTAB, no W/R/R  CV: RRR, strong pulses equal b/l in UE  GI: abdomen soft, NT, ND no R/G  Back: No CVAT  Ext: Nontender without swelling, deformity, or edema   Neuro: A&O x 3. Fluent and non-dysarthric speech. Following commands well; moving all four extremities well with no gross ataxia. No obvious focal neurological  deficits.  Psych: rapid speech, normal affect, and cognition  Skin: Warm, well perfused, no diaphoresis, pallor or rash    Imaging Reads  No results found.    EKG   NSR, no ST elevation or acute signs of ischemia. No signs of old infarction, compared to old EKG.     Impression/MDM:  [name] is a 46 year old male pmhx listed above who presents with acute chest pain. Story suspicious for MI. He has very similar visits at various EDs including a clean cath 6 months ago and chart review but will work-up for ACS.     -EKG, Trop x2, CXR     2200: EKG no signs of acute ischemia. Initial trop 7. CXR clear. Patient stable not complaining of chest pain,  requesting 2% milk and multiple sandwiches. He denies chest pain at this time and is asking to leave.     2256 repeat troponin stable in negative. Patient feels well and wants to leave. The differential diagnosis and aftercare instructions were discussed with the patient in detail who verbalized understanding and appreciation as well as RTED precautions. Additional verbal instructions specific to diagnosis and differential were discussed at length with patient.       Plan:   Orders Placed This Encounter   Procedures   . X-Ray Chest Frontal And Lateral   . Comprehensive Metabolic Panel Green   . HIV 1/2 Antibody & P24 Antigen Assay Yellow serum separator tube   . Troponin T Gen 5 w/Reflex to CK/CKMB Green Plasma Separator Tube   . Pro Bnp, Blood Green Plasma Separator Tube   . CBC w/ Diff Lavender   . Urine Immunoassay Drug Screen   . Magnesium, Blood Green Plasma Separator Tube   . Lab Add On Test   . Troponin T Gen 5 w/Reflex to CK/CKMB Green Plasma Separator Tube   . Magnesium, Blood   . Urine Immunoassay Drug Screen   . Comprehensive Metabolic Panel Green   . Troponin T Gen 5 w/Reflex to CK/CKMB Green Plasma Separator Tube   . Magnesium, Blood Green Plasma Separator Tube   . HIV 1/2 Antibody & P24  Antigen Assay Yellow serum separator tube   . Confirm  Cannabinoids-Urine by LC-MSMS, Qualitative   . Troponin T Gen 5 w/Reflex to CK/CKMB Green Plasma Separator Tube   . ECG 12 Lead       Medications   acetaminophen (TYLENOL) tablet 975 mg (975 mg Oral Given 10/14/16 2032)          Lambert ModyGuittard, Marino Rogerson Albert, MD  Resident  10/15/16 0112       Darnell Level'Connell, Charles W, MD  10/21/16 [phone]

## 2016-10-14 NOTE — EMS Narrative (Addendum)
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, Weakness-R53.1, Shortness of breath-R06.02, Presence of   coronary angioplasty implant and graft-Z95.5, Long term (current) use of   aspirin-Z79.82, Syncope and collapse-R55, Major depressive disorder, single   episode, unspecified-F32.9, Nicotine dependence, cigarettes,   uncomplicated-F17.210, Other psychoactive substance abuse, uncomplicated,   Cannabis use, unspecified, uncomplicated-F12.90, Tobacco abuse   counseling-Z71.6, Generalized hyperhidrosis-R61, HOMICIDAL IDEATIONS; Pt   Medication: Nitroglycerin 0.4 Mg Sl Subl, atorvastatin 20 MG Oral Tablet,   Aspirin 81 Mg Or Chew, Aspirin 81 Mg Or Chew, atorvastatin 20 MG Oral   Tablet, Nitroglycerin 0.4 Mg Sl Subl; Pt Allergies: Ketorolac Tromethamine,   Tramadol, tramadol hydrochloride, No known food allergy;  Sudden onset of chest pain [place] train station, pt did not have   his ntg on him so 911 was activated    S/S: nausea; Date/Time: 10/14/2016 18:07:45; Chest: Pain; Mental Status:   Normal Baseline for Patient; Neuro: Normal Baseline for Patient;     Pt is alert and oriented, speaking clear and full sentences, complains of   chest     Crew: Barbaraann ShareJake Forrester; Date/Time: 10/14/2016 18:30:15; Prior Care: No;   Medication Given: Zofran (Ondansetron); Route: Sublingual; Dosage: 4;   Units: Milligrams (mg); Response: Improved; Complication: None; Role/Type   of Person Administering Medication: EMT-Paramedic; Authorization: SO -   Standing Order;   Crew: Modena MorrowJuan Orozco; Date/Time: 10/14/2016 18:09:35; Prior Care: Yes;   Medication Given: Aspirin; Route: Oral; Dosage: 324;  Units: Milligrams   (mg); Role/Type of Person Administering Medication: EMT-Paramedic;   Authorization: SO - Standing Order;   Crew: Modena MorrowJuan Orozco; Date/Time: 10/14/2016 18:09:38; Prior Care: Yes;   Medication Given: NTG; Route: Sublingual; Dosage: 0.4; Units: Milligrams   (mg); Role/Type of Person Administering Medication: EMT-Paramedic;   Authorization: SO - Standing Order;   Crew: Modena MorrowJuan Orozco; Date/Time: 10/14/2016 18:09:32; Prior Care: Yes;   Medication Given: Nitroglycerin (NTG); Route: Topical; Dosage: 1; Units:   Inches (in); Role/Type of Person Administering Medication: EMT-Paramedic;   Authorization: SO - Standing Order;     Base Called: [place]    CC:  Complaint Type: Chief (Primary)  Complaint: Chest pain  Chief Complaint Anatomic Location: Chest    HPI:  Primary Symptom: Chest pain, unspecified  Other Associated Symptoms: Nausea  Provider's Primary Impression: Angina pectoris, unspecified  Initial Patient Acuity: Emergent (Yellow)    Alert:  Patient Care Report Number: [id number]  Incident Number: [id number]  EMS Vehicle (Unit) Number: [id number]  EMS Unit Call Sign: [id number]  Level of Care of This Unit: ALS-Paramedic  Incident Location Type: Street and highway as place  Incident Street Address: [address]  [place]: [phone]  Incident ZIP Code: [id number]    Assessment:  Heart Assessment: Normal  Mental Status Assessment: Normal Baseline for Patient    Procedure - Arrest:  Cardiac Arrest: No    Procedure - Exam:    Procedure - Injury:    Procedure - Airway:    Procedure - Medications:  Date/Time Medication Administered: 2018-07-31T18:09:32-07:00  Medication Administered Prior to this Unit's EMS Care: Yes  Medication Given: 4917  Medication Administered Route: Topical  Medication Dosage: 1  Medication Dosage Units: Inches (in)  Date/Time Medication Administered: 2018-07-31T18:09:38-07:00  Medication Administered Prior to this Unit's EMS Care: Yes  Medication Given: 4917  Medication Administered Route:  Sublingual  Medication Dosage: 0.4  Medication Dosage Units: Milligrams (mg)  Date/Time Medication Administered: 2018-07-31T18:09:35-07:00  Medication Administered Prior to this Unit's EMS Care: Yes  Medication Given: 1191  Medication Administered Route: Oral  Medication Dosage: 324  Medication Dosage Units: Milligrams (mg)  Date/Time Medication Administered: 2018-07-31T18:30:15-07:00  Medication Administered Prior to this Unit's EMS Care: No  Medication Given: 26225  Medication Administered Route: Sublingual  Medication Dosage: 4  Medication Dosage Units: Milligrams (mg)  Response to Medication: Improved  Medication Complication: None    Procedure - Generic:  Date/Time Procedure Performed: 2018-07-31T18:00:10-07:00  Procedure Performed Prior to this Unit's EMS Care: Yes  Procedure: [id number]  Date/Time Procedure Performed: 2018-07-31T18:04:00-07:00  Procedure Performed Prior to this Unit's EMS Care: Yes  Procedure: [id number]  Date/Time Procedure Performed: 2018-07-31T18:26:29-07:00  Procedure Performed Prior to this Unit's EMS Care: No  Procedure: [id number]  Date/Time Procedure Performed: 2018-07-31T18:29:00-07:00  Procedure Performed Prior to this Unit's EMS Care: No  Procedure: Saline Lock  Vascular Access Location: Forearm-Left  Date/Time Procedure Performed: 2018-07-31T18:23:26-07:00  Procedure Performed Prior to this Unit's EMS Care: No  Procedure: [id number]    Demographics History:  Medication Allergies: 28200  Medication Allergies: 10689  Medication Allergies: 82110  Environmental/Food Allergies: [id number]  Current Medications: 366440318272  Current Medications: 347425617310  Current Medications: 956387198039  Current Medications: 564332318272  Current Medications: 951884617310  Current Medications: 166063198039  Pregnancy: No    Demographics Practitioner:    Demographics Patient:  Last Name: [name] [name]  First Name: [name] [name]  Middle Initial/Name: [name] BrownsAnthony  [facility]  [place]: [phone]  Gender: Male  Race: White  Age:  [age]  Age Units: Years    Demographics Times:  Unit Notified by Dispatch Date/Time: 2018-07-31T17:50:38-07:00  Unit En Route Date/Time: 2018-07-31T17:50:45-07:00  Unit Arrived on Scene Date/Time: 2018-07-31T18:16:25-07:00  Arrived at Patient Date/Time: 2018-07-31T17:57:13-07:00  Unit Left Scene Date/Time: 2018-07-31T18:31:06-07:00  Patient Arrived at Destination Date/Time: 2018-07-31T18:43:34-07:00    Demographics Payment:

## 2016-10-14 NOTE — ED Notes (Signed)
Med student at bedside

## 2016-10-14 NOTE — ED Notes (Signed)
Pt requesting  2% Milk in order to pee.  ER MD ok with pt drinking.

## 2016-10-14 NOTE — ED Notes (Signed)
Lab called at this time to report that CMP, Cardiac Enzymes, Magnesium, and HIV hemolyzed. Labs to be reordered and redrawn.

## 2016-10-14 NOTE — Discharge Instructions (Signed)
You are being discharged from the [facility] after being seen for the condition detailed later in these instructions. Please refer to the instructions for your specific condition and note the symptoms you should watch out for and when to return to the Emergency Dept. You should return as needed if your symptoms worsen or persist, you are unable to eat, drink, or tolerate your medications, you develop chest pain, shortness of breath, or any other health concerns. Please follow up with your primary care doctor, another MD, this department or other specialist as discussed today in your follow up plan within 1-2 days if possible for continuation of care.    - RETURN TO ER IF SYMPTOMS WORSEN OR CONCERNS ARISE  - FOLLOW-UP WITH YOUR PRIMARY CARE DOCTOR IN 1-2 DAYS  - TAKE MEDICATIONS AS DIRECTED  - PLEASE READ PACKAGE INSERT FOR ALL MEDICATIONS FOR POTENTIAL SIDE-EFFECTS AND MEDICATION INTERACTIONS        Cardiac Event Monitoring    Your doctor decided to put you on a cardiac event monitor. A cardiac event monitor is used when people have transient (short-lived) heart symptoms. These symptoms include chest pain, palpitations (fluttering in chest or extra heartbeats), lightheadedness, shortness of breath, or fainting spells. A cardiac heart monitor gives your doctor or cardiologist (heart doctor) more information about why you're having these symptoms.     The device is similar to an EKG (electrocardiogram) that you'd get in the office or hospital. An EKG detects problems with a heartbeat's rhythm or rate. An EKG only takes a snapshot of your heart's electrical activity over a few seconds. It's possible you won't have symptoms at that specific time. A cardiac event monitor can take multiple snapshots over time. It can also measure your heart's electrical activity the entire time you are wearing it.    There are different types of cardiac event monitors. They can be divided into these groups:     Manual: This  is a type of monitor you activate when you feel your heart symptoms. The device then records the event. When the device is activated, it also captures the 30-60 seconds of activity before you pressed the button.    Automatic: With this type of device, the heart's electrical activity is continuously being monitored and recorded. There's no need to press a button to activate the device.     Implantable loop recorders: These devices are inserted just beneath the skin on your chest. These are typically used when other event monitors have not shown a cause for the symptoms or the symptoms are very rare.    The amount of time you need to wear the cardiac event monitor varies. It depends on the type you have. Most monitors are typically worn for about a month. The information gathered over time is then sent to your doctor for review.     Use these instructions for the device:     If you are unsure how to wear the device, ask your doctor. Most of the time the office or hospital will put it on for you.     There are many different cardiac event monitors. They all come with small sticky patches called electrodes that stick to your chest. Follow the instructions that came with your device for proper placement.     These sensors need to have good skin contact for best results. Increased sweating, oily skin, or a lot of hair on the chest may make it hard to place the electrodes. You  may need to shave the area before placing the electrodes. This is so you can get the best signal.     When you have symptoms, stop what you're doing. If you have a manual monitor, calmly activate it. Staying still and calm will ensure that the monitor will read your heart activity and not record your body's movement.      There are many electronic signals that can interfere with your device. These can include microwave ovens, magnets, medical detectors, and areas of high voltage. It is generally recommended that you avoid these while  wearing your monitor. Also, don't get your monitor wet.     Cell phones and MP3 players can also interfere if too close to the monitor. Keep these 6 to 12 inches away from your cardiac event monitor while using them.     Keep a diary of your symptoms. After activating the event monitor when you have symptoms, write down your symptoms, what time they occurred, and what you were doing at the time. This will help your doctor match the symptoms with your heart's electrical activity.      Turn your device into the doctor's office or the place where you picked it up. Your doctor will tell you when you can expect your results. It may take time to review the recordings. Typically, another appointment is scheduled to go over the results.    When you should contact us.     If you have questions or concerns about your event monitor.    When you should seek care at an Emergency Department.    YOU SHOULD SEEK MEDICAL ATTENTION IMMEDIATELY AT THE NEAREST EMERGENCY DEPARTMENT, IF ANY OF THE FOLLOWING HAPPENS:     You have new, more frequent, or increased chest pain.    You have severe shortness of breath with your palpitations (fast heart rate).   Your palpitations make you faint or feel very weak.   Your palpitations last more than a few minutes.

## 2016-10-15 LAB — ECG 12-LEAD
ATRIAL RATE: 74 {beats}/min
ECG INTERPRETATION: NORMAL
P AXIS: 10 degrees
PR INTERVAL: 114 ms
QRS INTERVAL/DURATION: 102 ms
QT: 368 ms
QTC INTERVAL: 408 ms
R AXIS: 114 degrees
T AXIS: 57 degrees
VENTRICULAR RATE: 74 {beats}/min

## 2016-10-15 LAB — HIV 1/2 ANTIBODY & P24 ANTIGEN ASSAY, BLOOD: HIV 1/2 Antibody & P24 Antigen Assay: NONREACTIVE

## 2016-10-17 LAB — CONFIRM CANNABINOIDS-URINE BY LC-MSMS, QUALITATIVE: Conf. THC: POSITIVE ng/mL — AB

## 2016-10-28 DIAGNOSIS — F8081 Childhood onset fluency disorder: Secondary | ICD-10-CM

## 2016-10-28 DIAGNOSIS — Z9282 Status post administration of tPA (rtPA) in a different facility within the last 24 hours prior to admission to current facility: Secondary | ICD-10-CM

## 2016-10-28 DIAGNOSIS — G43409 Hemiplegic migraine, not intractable, without status migrainosus: Secondary | ICD-10-CM

## 2016-10-30 DIAGNOSIS — F1599 Other stimulant use, unspecified with unspecified stimulant-induced disorder: Secondary | ICD-10-CM

## 2016-10-30 DIAGNOSIS — F329 Major depressive disorder, single episode, unspecified: Secondary | ICD-10-CM

## 2016-10-30 DIAGNOSIS — F1299 Cannabis use, unspecified with unspecified cannabis-induced disorder: Secondary | ICD-10-CM

## 2016-10-30 DIAGNOSIS — F4310 Post-traumatic stress disorder, unspecified: Secondary | ICD-10-CM

## 2016-12-16 DIAGNOSIS — R29810 Facial weakness: Secondary | ICD-10-CM

## 2016-12-16 DIAGNOSIS — Z716 Tobacco abuse counseling: Secondary | ICD-10-CM

## 2016-12-16 DIAGNOSIS — I252 Old myocardial infarction: Secondary | ICD-10-CM

## 2016-12-16 DIAGNOSIS — I69354 Hemiplegia and hemiparesis following cerebral infarction affecting left non-dominant side: Secondary | ICD-10-CM

## 2016-12-16 DIAGNOSIS — F3289 Other specified depressive episodes: Secondary | ICD-10-CM

## 2016-12-16 DIAGNOSIS — F4312 Post-traumatic stress disorder, chronic: Secondary | ICD-10-CM

## 2016-12-16 DIAGNOSIS — I10 Essential (primary) hypertension: Secondary | ICD-10-CM

## 2016-12-16 DIAGNOSIS — M6281 Muscle weakness (generalized): Secondary | ICD-10-CM

## 2016-12-16 DIAGNOSIS — Z720 Tobacco use: Secondary | ICD-10-CM

## 2016-12-16 DIAGNOSIS — I2510 Atherosclerotic heart disease of native coronary artery without angina pectoris: Secondary | ICD-10-CM

## 2016-12-30 LAB — CMP (EXT)
ALT/SGPT (EXT): 27 U/L (ref 16–61)
AST/SGOT (EXT): 23 U/L (ref 10–37)
Albumin (EXT): 3.6 g/dL (ref 3.4–5.0)
Alkaline Phosphatase (EXT): 111 U/L (ref 45–130)
Anion Gap (EXT): 9 mmol/L (ref 7–16)
BUN (EXT): 12 mg/dL (ref 5–26)
Bilirubin, Total (EXT): 0.3 mg/dL (ref 0.2–1.0)
CO2 (EXT): 23 mmol/L (ref 21–32)
CalciumCalcium (EXT): 8.4 mg/dL — ABNORMAL LOW (ref 8.5–10.5)
Chloride (EXT): 107 mmol/L (ref 95–110)
Creatinine (EXT): 0.76 mg/dL (ref 0.70–1.30)
Globulin (EXT): 3.7 g/dL (ref 2.1–4.3)
Glucose (EXT): 108 mg/dL — ABNORMAL HIGH (ref 70–99)
Potassium (EXT): 4.3 mmol/L (ref 3.5–5.1)
Protein (EXT): 7.3 g/dL (ref 6.4–8.2)
Sodium (EXT): 139 mmol/L (ref 135–145)
eGFR - Creat MDRD (EXT): 110 mL/min/1.73m2 (ref 60–300)
eGFR - Creat MDRD (EXT): >120 mL/min/1.73m2 (ref 60–300)

## 2017-02-03 LAB — LIPID PROFILE (EXT)
Cholesterol (EXT): 135 mg/dL (ref <=200)
HDL Cholesterol (EXT): 47 mg/dL (ref 40–90)
LDL Cholesterol (EXT): 64 mg/dL
NON HDL Cholesterol (EXT): 88 mg/dL (ref 0–129)
Triglycerides (EXT): 121 mg/dL (ref 0–149)

## 2017-02-26 LAB — CREATININE CARE EVERYWHERE
CREATININE CARE EVERYWHERE: 0.74 mg/dL (ref <=1.30)
ESTIMATED GFR CARE EVERYWHERE: 111 mL/min/BSA

## 2017-02-26 LAB — APTT CARE EVERYWHERE: APTT CARE EVERYWHERE: 28 s (ref 22–32)

## 2017-02-27 LAB — CMP (EXT)
ALT/SGPT (EXT): 21 U/L (ref <=49)
AST/SGOT (EXT): 19 U/L (ref 17–59)
Albumin (EXT): 3.8 g/dL (ref 3.5–5.0)
Alkaline Phosphatase (EXT): 73 U/L (ref 38–126)
Anion Gap (EXT): 8 mmol/L (ref 6–14)
BUN (EXT): 17 mg/dL (ref 9–20)
Bilirubin, Total (EXT): 0.2 mg/dL (ref 0.2–1.3)
CO2 (EXT): 23 mmol/L (ref 22–30)
CalciumCalcium (EXT): 8.7 mg/dL (ref 8.4–10.2)
Chloride (EXT): 108 mmol/L — ABNORMAL HIGH (ref 98–107)
Creatinine (EXT): 0.7 mg/dL (ref 0.7–1.3)
Glucose (EXT): 92 mg/dL (ref 70–125)
Osmolality (EXT): 289 mosm/kg (ref 280–305)
Potassium (EXT): 4.3 mmol/L (ref 3.5–5.1)
Protein (EXT): 6.3 g/dL (ref 6.3–8.2)
Sodium (EXT): 139 mmol/L (ref 137–145)
eGFR - Creat MDRD (EXT): >60 mL/min/{1.73_m2} (ref >60)
eGFR - Creat MDRD (EXT): >60 mL/min/{1.73_m2} (ref >60)

## 2017-02-27 LAB — HEMOGLOBIN A1C
Estimated Average Glucose mg/dL (INT/EXT): 120 mg/dL
HEMOGLOBIN A1C % (INT/EXT): 5.8 % — ABNORMAL HIGH (ref <5.7)

## 2017-02-27 LAB — HEMOGLOBIN A1C CARE EVERYWHERE
HEMOGLOBIN A1C CARE EVERYWHERE: 5.8 % — ABNORMAL HIGH (ref <5.7)
MEAN BLOOD GLUCOSE CARE EVERYWHERE: 120 mg/dL

## 2017-02-27 LAB — HEMOGLOBIN A1C - EXTERNAL
Estimated Mean Glucose: 120 mg/dL
Hemoglobin A1C: 5.8 % — ABNORMAL HIGH (ref ?–5.7)

## 2017-03-03 LAB — CREATININE CARE EVERYWHERE
CREATININE CARE EVERYWHERE: 0.75 mg/dL (ref <=1.30)
ESTIMATED GFR CARE EVERYWHERE: 111 mL/min/BSA

## 2017-06-04 LAB — CMP (EXT)
ALT/SGPT (EXT): 19 U/L (ref 5–60)
AST/SGOT (EXT): 19 U/L (ref 16–40)
Albumin (EXT): 4.1 g/dL (ref 3.5–5.0)
Alkaline Phosphatase (EXT): 88 U/L (ref 38–126)
Anion Gap (EXT): 9 mmol/L (ref 8–14)
BUN (EXT): 14 mg/dL (ref 8–24)
Bilirubin, Total (EXT): 0.2 mg/dL (ref 0.2–1.4)
CO2 (EXT): 21 mmol/L (ref 20–26)
CalciumCalcium (EXT): 9 mg/dL (ref 8.4–10.5)
Chloride (EXT): 108 mmol/L (ref 102–110)
Creatinine (EXT): 0.79 mg/dL (ref 0.72–1.25)
Glucose (EXT): 105 mg/dL (ref 64–128)
Potassium (EXT): 3.9 mmol/L (ref 3.3–5.0)
Protein (EXT): 6.7 g/dL (ref 6.5–8.4)
Sodium (EXT): 138 mmol/L (ref 136–144)

## 2017-06-04 LAB — LIPID PROFILE (EXT)
Cholesterol (EXT): 107 mg/dL
HDL Cholesterol (EXT): 39 mg/dL — ABNORMAL LOW (ref 40–59)
LDL Cholesterol, CALC (EXT): 54 mg/dL (ref 0–129)
NON HDL Cholesterol (EXT): 68 mg/dL
Triglycerides (EXT): 70 mg/dL (ref 30–149)
VLDL Cholesterol (EXT): 14 mg/dL (ref 0–30)

## 2017-06-04 LAB — PROTHROMBIN TIME CARE EVERYWHERE
INR CARE EVERYWHERE: 1.1 ratio — NL
PROTHROMBIN TIME CARE EVERYWHERE: 13.3 s — NL (ref 12.0–15.5)

## 2017-06-04 LAB — APTT CARE EVERYWHERE: APTT CARE EVERYWHERE: 31 s — NL (ref 24–35)

## 2017-06-13 LAB — CREATININE CARE EVERYWHERE
CREATININE CARE EVERYWHERE: 0.8 mg/dL (ref <=1.30)
ESTIMATED GFR CARE EVERYWHERE: 108 mL/min/BSA

## 2017-06-19 LAB — CMP (EXT)
ALT/SGPT (EXT): 21 U/L (ref <=49)
AST/SGOT (EXT): 20 U/L (ref 17–59)
Albumin (EXT): 4 g/dL (ref 3.5–5.0)
Alkaline Phosphatase (EXT): 80 U/L (ref 38–126)
Anion Gap (EXT): 10 mmol/L (ref 6–14)
BUN (EXT): 16 mg/dL (ref 9–20)
Bilirubin, Total (EXT): 0.2 mg/dL (ref 0.2–1.3)
CO2 (EXT): 22 mmol/L (ref 22–30)
CalciumCalcium (EXT): 8.6 mg/dL (ref 8.4–10.2)
Chloride (EXT): 108 mmol/L — ABNORMAL HIGH (ref 98–107)
Creatinine (EXT): 0.7 mg/dL (ref 0.7–1.3)
Glucose (EXT): 90 mg/dL (ref 70–125)
Osmolality (EXT): 291 mosm/kg (ref 280–305)
Potassium (EXT): 4.2 mmol/L (ref 3.5–5.1)
Protein (EXT): 6.8 g/dL (ref 6.3–8.2)
Sodium (EXT): 140 mmol/L (ref 137–145)
eGFR - Creat MDRD (EXT): >60 mL/min/{1.73_m2} (ref >60)
eGFR - Creat MDRD (EXT): >60 mL/min/{1.73_m2} (ref >60)

## 2017-06-19 LAB — APTT CARE EVERYWHERE: APTT CARE EVERYWHERE: 27 s (ref 22–32)

## 2017-06-19 LAB — PROTHROMBIN TIME CARE EVERYWHERE
INR CARE EVERYWHERE: 1.1 (ref 0.9–1.1)
PROTHROMBIN TIME CARE EVERYWHERE: 11.2 s (ref 9.4–11.6)

## 2017-06-26 ENCOUNTER — Emergency Department
Admission: EM | Admit: 2017-06-26 | Discharge: 2017-06-26 | Disposition: A | Discharge status: Home / Self Care | Payer: Self-pay | Attending: Emergency Medicine | Admitting: Emergency Medicine

## 2017-06-26 ENCOUNTER — Emergency Department (EMERGENCY_DEPARTMENT_HOSPITAL): Payer: Self-pay

## 2017-06-26 ENCOUNTER — Encounter: Payer: Self-pay | Admitting: Emergency Medicine

## 2017-06-26 ENCOUNTER — Emergency Department: Payer: Self-pay

## 2017-06-26 DIAGNOSIS — R55 Syncope and collapse: Secondary | ICD-10-CM

## 2017-06-26 DIAGNOSIS — I639 Cerebral infarction, unspecified: Secondary | ICD-10-CM

## 2017-06-26 DIAGNOSIS — Z8673 Personal history of transient ischemic attack (TIA), and cerebral infarction without residual deficits: Secondary | ICD-10-CM | POA: Insufficient documentation

## 2017-06-26 DIAGNOSIS — R079 Chest pain, unspecified: Secondary | ICD-10-CM

## 2017-06-26 DIAGNOSIS — R2990 Unspecified symptoms and signs involving the nervous system: Secondary | ICD-10-CM | POA: Insufficient documentation

## 2017-06-26 DIAGNOSIS — F17200 Nicotine dependence, unspecified, uncomplicated: Secondary | ICD-10-CM | POA: Insufficient documentation

## 2017-06-26 DIAGNOSIS — R29818 Other symptoms and signs involving the nervous system: Secondary | ICD-10-CM

## 2017-06-26 DIAGNOSIS — I10 Essential (primary) hypertension: Secondary | ICD-10-CM | POA: Insufficient documentation

## 2017-06-26 DIAGNOSIS — Z7982 Long term (current) use of aspirin: Secondary | ICD-10-CM | POA: Insufficient documentation

## 2017-06-26 DIAGNOSIS — G8929 Other chronic pain: Secondary | ICD-10-CM | POA: Insufficient documentation

## 2017-06-26 DIAGNOSIS — H532 Diplopia: Secondary | ICD-10-CM

## 2017-06-26 DIAGNOSIS — Z79899 Other long term (current) drug therapy: Secondary | ICD-10-CM | POA: Insufficient documentation

## 2017-06-26 DIAGNOSIS — R531 Weakness: Secondary | ICD-10-CM

## 2017-06-26 DIAGNOSIS — R202 Paresthesia of skin: Secondary | ICD-10-CM

## 2017-06-26 DIAGNOSIS — R0789 Other chest pain: Secondary | ICD-10-CM

## 2017-06-26 LAB — CBC WITH DIFFERENTIAL
BASOPHILS % AUTO: 0.9 %
BASOPHILS ABS AUTO: 0.1 10*3/uL (ref 0.0–0.2)
Basophils % Auto: 0.6 %
Basophils Abs Auto: 0 10*3/uL (ref 0.0–0.2)
EOSINOPHIL % AUTO: 2.3 %
EOSINOPHIL ABS AUTO: 0.1 10*3/uL (ref 0.0–0.5)
Eosinophils % Auto: 2.9 %
Eosinophils Abs Auto: 0.2 10*3/uL (ref 0.0–0.5)
HEMATOCRIT: 35.3 % — AB (ref 40.0–52.0)
HEMOGLOBIN: 11.7 g/dL — AB (ref 14.0–18.0)
Hematocrit: 35 % — ABNORMAL LOW (ref 40.0–52.0)
Hemoglobin: 11.5 g/dL — ABNORMAL LOW (ref 14.0–18.0)
LYMPHOCYTE ABS AUTO: 1.1 10*3/uL (ref 1.0–4.8)
LYMPHOCYTES % AUTO: 19.9 %
Lymphocytes % Auto: 23.7 %
Lymphocytes Abs Auto: 1.4 10*3/uL (ref 1.0–4.8)
MCH: 24.5 pg — AB (ref 27.0–33.0)
MCH: 24.2 pg — ABNORMAL LOW (ref 27.0–33.0)
MCHC: 33.2 % (ref 32.0–36.0)
MCHC: 33 % (ref 32.0–36.0)
MCV: 73.7 fL — AB (ref 80.0–100.0)
MCV: 73.5 fL — ABNORMAL LOW (ref 80.0–100.0)
MONOCYTES % AUTO: 7 %
MPV: 7.5 fL (ref 6.8–10.0)
MPV: 7.4 fL (ref 6.8–10.0)
Monocytes % Auto: 7.7 %
Monocytes Abs Auto: 0.4 10*3/uL (ref 0.1–0.8)
Monocytes Abs Auto: 0.4 10*3/uL (ref 0.1–0.8)
NEUTROPHIL ABS AUTO: 4 10*3/uL (ref 1.8–7.7)
NEUTROPHILS % AUTO: 69.9 %
Neutrophils % Auto: 65.1 %
Neutrophils Abs Auto: 3.7 10*3/uL (ref 1.8–7.7)
PLATELET COUNT: 354 10*3/uL (ref 130–400)
Platelet Count: 340 10*3/uL (ref 130–400)
RDW: 19.7 % — AB (ref 0.0–14.7)
RDW: 19.2 % — ABNORMAL HIGH (ref 0.0–14.7)
RED CELL COUNT: 4.79 10*6/uL (ref 4.50–5.90)
Red Blood Cell Count: 4.76 10*6/uL (ref 4.50–5.90)
WHITE BLOOD CELL COUNT: 5.7 10*3/uL (ref 4.5–11.0)
White Blood Cell Count: 5.7 10*3/uL (ref 4.5–11.0)

## 2017-06-26 LAB — BASIC METABOLIC PANEL
CALCIUM: 8.7 mg/dL (ref 8.6–10.5)
CALCIUM: 8.3 mg/dL — AB (ref 8.6–10.5)
CARBON DIOXIDE TOTAL: 21 mmol/L — AB (ref 24–32)
CARBON DIOXIDE TOTAL: 21 mmol/L — AB (ref 24–32)
CHLORIDE: 104 mmol/L (ref 95–110)
CHLORIDE: 101 mmol/L (ref 95–110)
CREATININE BLOOD: 0.88 mg/dL (ref 0.44–1.27)
CREATININE BLOOD: 0.84 mg/dL (ref 0.44–1.27)
GLUCOSE: 115 mg/dL — AB (ref 70–99)
Glucose: 137 mg/dL — ABNORMAL HIGH (ref 70–99)
POTASSIUM: 4.6 mmol/L (ref 3.3–5.0)
POTASSIUM: 4.1 mmol/L (ref 3.3–5.0)
SODIUM: 136 mmol/L (ref 135–145)
Sodium: 135 mmol/L (ref 135–145)
UREA NITROGEN, BLOOD (BUN): 11 mg/dL (ref 8–22)
UREA NITROGEN, BLOOD (BUN): 12 mg/dL (ref 8–22)

## 2017-06-26 LAB — TYPE AND SCREEN
ANTIBODY SCREEN: NEGATIVE
PATIENT BLOOD TYPE: A POS

## 2017-06-26 LAB — TROPONIN T
TROPONINT: 9 ng/L (ref <=19)
TROPONINT: 9 ng/L (ref <=19)

## 2017-06-26 LAB — INR
INR: 0.98 (ref 0.87–1.18)
PROTHROMBIN TIME: 9.7 s (ref 8.0–11.9)

## 2017-06-26 LAB — APTT STUDIES: aPTT: 30.3 secs (ref 24.1–36.7)

## 2017-06-26 LAB — ED HCV SCREEN WITH REFLEX: Hepatitis C Ab Screen: NONREACTIVE

## 2017-06-26 LAB — ETHANOL, PLASMA: ETHANOL, PLASMA: NEGATIVE mg/dL

## 2017-06-26 LAB — POC GLUCOSE: POC GLUCOSE: 129 mg/dL

## 2017-06-26 LAB — HIV AG/AB COMBO SCREEN: HIV AG/AB COMBO SCREEN: NONREACTIVE

## 2017-06-26 LAB — HEPATITIS C ANTIBODY CARE EVERYWHERE: HEPATITIS C ANTIBODY CARE EVERYWHERE: NONREACTIVE

## 2017-06-26 LAB — INR CARE EVERYWHERE
INR CARE EVERYWHERE: 0.98 (ref 0.87–1.18)
PROTHROMBIN TIME CARE EVERYWHERE: 9.7 s (ref 8.0–11.9)

## 2017-06-26 LAB — TYPE AND SCREEN CARE EVERYWHERE
ABO/RH CARE EVERYWHERE: A POS
ANTIBODY SCREEN CARE EVERYWHERE: NEGATIVE

## 2017-06-26 LAB — APTT CARE EVERYWHERE: APTT CARE EVERYWHERE: 30.3 s (ref 24.1–36.7)

## 2017-06-26 LAB — GENERIC EXTERNAL RESULT (UNMAPPED): HIV 1,2 Ag/Ab Combo: NONREACTIVE — NL

## 2017-06-26 MED ORDER — IOHEXOL 350 MG IODINE/ML INTRAVENOUS SOLUTION
125.0000 mL | INTRAVENOUS | Status: AC
Start: 2017-06-26 — End: 2017-06-26
  Administered 2017-06-26: 125 mL via INTRAVENOUS

## 2017-06-26 MED ORDER — LORAZEPAM 2 MG/ML INJECTION SOLUTION
2.0000 mg | Freq: Once | INTRAMUSCULAR | Status: AC
Start: 2017-06-26 — End: 2017-06-26
  Administered 2017-06-26: 2 mg via INTRAVENOUS
  Filled 2017-06-26: qty 1

## 2017-06-26 NOTE — ED Nursing Note (Signed)
Neuro at bedside; md wants pt transported to MRI asap, before labs are drawn; will draw labs when pt returns

## 2017-06-26 NOTE — ED Nursing Note (Signed)
Pt experienced a syncopal episode seen by pedestrians; pt c/o new weakness and dizziness beginning after syncopal episode, about 45 mins ago; A&Ox4; GCS 15; even and unlabored respirations;
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
## 2017-06-26 NOTE — Nurse Focus (Signed)
Patient unable to complete MRI at this time. Complaints of claustrophobia and unable to hold still. Neurology MD at bedside and aware. Neuro assessment returned to normal, no left sided weakness noted on exam. No need for MRI now. Returned to [facility] [id number]    Xochitl Egle, RN

## 2017-06-26 NOTE — ED Nursing Note (Signed)
Patient asleep withNAD noted, breathing is even and unlabored, awaiting Neuro dispo. WCTM

## 2017-06-26 NOTE — ED Provider Notes (Addendum)
EMERGENCY DEPARTMENT PHYSICIAN NOTE - Nicholas Krueger       Date of Service:   06/26/2017  1:29 PM Patient's PCP: No Pcp, No Pcp (Inactive)   Note Started: 06/26/2017 13:56 DOB: 07/25/1970         Chief Complaint   Patient presents with    *944:Medical     acute dizziness       The history provided by the patient, EMS personnel and medical records.  Interpreter used: No    Nicholas Krueger is a 47yr old male, who has a past medical history significant for HTN, CVA (4.5 years ago with no residual deficits), presenting to the ED with a chief complaint of acute onset chest pain 45 minutes ago while walking down the street, and he syncopized. When he came to, he had left sided facial numbness, left sided facial droop, left arm weakness, blurry vision, and ongoing severe CP. Endorses ripping chest pain up left back and into face. Not on blood thinners, does takes baby aspirin daily. Denies residual deficits from prior stroke. Denies drug use, does endorse intermittent marijuana use.     A full history, including past medical, social, and family history (as detailed in this note), was reviewed and updated as necessary.      HISTORY:  Past Medical History    Chronic pain    Hypoglycemia    Allergies   Allergen Reactions    Toradol [Ketorolac Tromethamine] Nausea/Vomiting    Tramadol Nausea/Vomiting      Past Surgical History:  No date: ------------other-------------  No date: ------------other-------------   Current Outpatient Medications:     Aspirin 81 mg Delayed Release Capsule, Take 81 mg by mouth every day.      Lisinopril (PRINIVIL, ZESTRIL) 20 mg Tablet, Take by mouth.    Nitroglycerin (NITROSTAT) 0.4 mg Sublingual Tablet, Dissolve 0.4 mg under the tongue every 5 minutes if needed.     Social History     Tobacco Use    Smoking status: Current Every Day Smoker   Substance Use Topics    Alcohol use: Not on file    Drug use: Yes     Types: Marijuana     Social History     Social History Narrative     Not on file    No family history on file.        Review of Systems   Unable to perform ROS: Acuity of condition         TRIAGE VITAL SIGNS:  Temp: 36.8 C (98.2 F) (06/26/17 1329)  Temp src: Oral (06/26/17 1329)  Pulse: 92 (06/26/17 1329)  BP: 128/70 (06/26/17 1329)  Resp: 16 (06/26/17 1329)  SpO2: 95 % (06/26/17 1329)  Weight: 83.9 kg (185 lb) (06/26/17 1329)    Physical Exam   Constitutional: He is oriented to person, place, and time. He appears well-developed and well-nourished. No distress.   Caucasian male, in acute distress, EMS at bedside   HENT:   Head: Normocephalic and atraumatic.   Nose: Nose normal.   Eyes: Pupils are equal, round, and reactive to light. Conjunctivae and EOM are normal. No scleral icterus.   Mild leftward gaze deviation with horizontal diplopia   Neck: Normal range of motion. No tracheal deviation present.   Cardiovascular: Normal rate, regular rhythm, normal heart sounds and intact distal pulses.   No murmur heard.  Pulmonary/Chest: Effort normal and breath sounds normal. No stridor. No respiratory distress.   Abdominal: Soft. Bowel sounds are  normal. He exhibits no distension. There is no tenderness.   Musculoskeletal: Normal range of motion. He exhibits no edema.   Moves all extremities well   Neurological: He is alert and oriented to person, place, and time.   CNs with left facial numbness, leftward deviation of tongue, left shoulder shrugging diminished, left facial droop. No dysmetria on FNF. Left arm pronator drift. Strength and sensation grossly intact and at baseline throughout.   Skin: Skin is warm and dry. He is not diaphoretic. No pallor.   Psychiatric: He has a normal mood and affect. Thought content normal.   Nursing note and vitals reviewed.        INITIAL ASSESSMENT & PLAN, MEDICAL DECISION MAKING, ED COURSE  Nicholas Krueger is a 46yr male who presents with a chief complaint of CP and neuro deficits.     Differential includes, but is not limited to: dissection,  stroke, ACS, recrudescence of prior stroke, PE     The results of the ED evaluation were notable for the following:     Pertinent lab results:   Labs Reviewed   CBC WITH DIFFERENTIAL - Abnormal       Result Value    White Blood Cell Count 5.7      Red Blood Cell Count 4.79      Hemoglobin 11.7 (*)     Hematocrit 35.3 (*)     MCV 73.7 (*)     MCH 24.5 (*)     MCHC 33.2      RDW 19.7 (*)     MPV 7.5      Platelet Count 354      Neutrophils % Auto 69.9      Lymphocytes % Auto 19.9      Monocytes % Auto 7.0      Eosinophil % Auto 2.3      Basophils % Auto 0.9      Neutrophil Abs Auto 4.0      Lymphocyte Abs Auto 1.1      Monocytes Abs Auto 0.4      Eosinophil Abs Auto 0.1      Basophils Abs Auto 0.1     BASIC METABOLIC PANEL - Abnormal    Sodium 136      Potassium 4.6      Chloride 104      Carbon Dioxide Total 21 (*)     Urea Nitrogen, Blood (BUN) 11      Glucose 137 (*)     Calcium 8.7      Creatinine Serum 0.88      E-GFR, African American >60      E-GFR, Non-African American >60      Narrative:     POTASSIUM: R-Moderate Hemolysis, result may be affected,      TROPONIN T - Normal    TROPONIN T 9      Narrative:     [facility] uses an Sport and exercise psychologist for [employer] (IFCC) compliant high sensitivity cardiac troponin T assay with a cut off corresponding to the 99th percentile of the reference population with <10% CV (imprecision). This cutoff for the high sensitivity cardiac troponin T assay is 19 ng/L.    High sensitivity cardiac troponin results should never be used with any contemporary troponin assays, and troponin I should never be used interchangeably with troponin T.    The Third Universal Definition of Myocardial Infarction from the Joint ESC/ACCF/AHA/WHF Task Force for the Redefinition of Myocardial Infarction (Circulation 2012; 126;  [id number]) states that a typical rise and fall of Troponin above the 99th percentile and 10% CV cut-off with at least one of the following as  suggestive of myocardial injury: (a) Ischemic symptoms, (b) ECG changes indicative of ischemia, (c) Imaging evidence of a new regional wall-motion abnormality, and/or (d) Identification of an intracoronary thrombus by angiography or autopsy.    New troponin units of measurements were implemented December 17, 2015. To convert from old units of measurement for troponin (ng/mL) to new units (ng/L), multiply old units by 1000 (e.g., [old units] ng/mL x 1000 = [new units] ng/L).    ED HCV SCREEN WITH REFLEX   CBC WITH DIFFERENTIAL   BASIC METABOLIC PANEL   INR   APTT STUDIES   ETHANOL, PLASMA   TROPONIN T   HIV AG/AB COMBO SCREEN   TYPE AND SCREEN   POC GLUCOSE    POC GLUCOSE 129     POC GLUCOSE       Pertinent imaging results (reviewed and interpreted independently by me):   Head CT: no ICH    Radiology reads:   Ct Angio Chest    Result Date: 06/26/2017  CT ANGIO CHEST WITH CONTRAST EXAM DATE: 06/26/2017 2:35 PM COMPARISON: CT chest 04/04/2011 INDICATION: Per EMR: Chest pain in combination with neurological symptoms. TECHNIQUE: CT angiography of the chest was performed following the uneventful intravenous administration of 100 mL of Omnipaque 350. Coronal, sagittal, and maximum intensity projection images were reformatted. 3D images were reformatted on a separate workstation. DOSE REPORT: CTDIvol:  138.2 mGy DLP:  2680.8 mGy-cm The dose-related parameters for CT are the volume computed tomography dose index and the dose length product. These parameters are not measures of patient dose per se, but are values generated from the CT scanner acquisition factors. The reported values may underestimate or overestimate the actual absorbed dose to the patient depending on patient size and other factors. For further information, see [email or web address].html FINDINGS: LOWER NECK AND CHEST WALL: Unremarkable MEDIASTINUM AND HILA: Mild thickening of the lower esophagus suggestive of esophagitis. No  enlarged nodes. CARDIOVASCULAR: Normal-sized heart. Aorta with normal dimensions. No evidence of dissection although motion artifact limits evaluation. Aortic wall hematoma cannot be excluded noninclusion of images before contrast administration. Suboptimal opacification of pulmonary arteries. No filling defects seen to suggest PE. Main pulmonary artery has normal width. No evidence of right heart strain. LUNGS, AIRWAYS, AND PLEURA: Mild dependent hypoventilatory changes. Moderate bronchial wall thickening. Disseminated tiny airspace nodules particularly in the upper lobes in conjunction with fine tree-in-bud changes compatible with respiratory bronchiolitis and small airways disease. No effusions.Marland Kitchen. UPPER ABDOMEN: Unremarkable given bolus timing. BONES: Mild wedge compression of the body of T8 appearing chronic. IMPRESSION: 1. No aortic aneurysm or dissection. 2. Findings compatible with bronchitis and small airways disease, likely due to smoking. Final Report Electronically Signed By: Clint LippsUlf Tylen, M.D. on 06/26/2017 3:04 PM    Ct Angio Head    Result Date: 06/26/2017  CT ANGIO HEAD EXAM DATE: 06/26/2017 2:35 PM. COMPARISON: None INDICATION: left facial numbness;Signs/Symptoms: CVA TECHNIQUE: Non contrast 2.5 mm axial images from skull base through vertex with sagittal and coronal reformatted images, followed by axial CT images of the head in soft tissue algorithm in the arterial phase during administration of 100 cc of intravenous contrast at 4 mL/sec. Coronal and sagittal average and maximal intensity projection reformatted images were also obtained. 3-D reconstructed images were obtained on a separate workstation. DOSE REPORT: This study involved (3) CT acquisition(s).  The CTDIvol  and DLP values are included below as required by state law: 1; Series: 2; Head; 16 cm; CTDIvol=54.9 mGy;  DLP 1036.2 mGy-cm 2; Series: 200; Head; 32 cm; CTDIvol=58.5 mGy; DLP 29.3 mGy-cm 3; Series: 6; Head; 32 cm; CTDIvol=24.8 mGy; DLP  1615.4 mGy-cm For further information on CT radiation dose, see http://cook-fox.com/ FINDINGS: Anterior circulation: No occlusion, high-grade stenosis, aneurysm or vascular malformation. Posterior circulation: Hypoplastic right V4 segment. No occlusion, high-grade stenosis, aneurysm or vascular malformation. Brain: No evidence of hemorrhage, mass, shift, or extra axial fluid collection. The gray-white matter differentiation is maintained. The ventricles are normal in size and morphology. Bones and orbits: Normal. Soft tissues: Normal. Paranasal sinuses and mastoid air cells: Scattered mucosal thickening of the paranasal sinuses. Small left mastoid effusion. Right mastoid air cells are clear. IMPRESSION: 1. No intracranial hemorrhage or mass effect. 2. There is no high-grade stenosis, occlusion or aneurysm. *THIS STUDY HAS NOT BEEN REVIEWED BY AN ATTENDING RADIOLOGIST* Preliminary Report Electronically Signed By: Miguel Rota on 06/26/2017 3:02 PM    Ct Angio Neck    Result Date: 06/26/2017  CT ANGIO NECK EXAM DATE: 06/26/2017 2:35 PM. COMPARISON: None INDICATION: facial numbness;Signs/Symptoms: CVA TECHNIQUE: Axial CT images of the neck were obtained in soft tissue algorithm in the arterial phase during administration of 100 cc of Omnipaque at 4 mL/sec. Coronal and sagittal average and maximal intensity projection reformatted images were also obtained. 3-D reconstructed images were obtained on a separate workstation. DOSE REPORT: Please see the concurrent CT of the head. FINDINGS: Aortic Arch: Atherosclerosis of the aortic arch. Left carotid:  No occlusion, high-grade stenosis, or vascular injury. Right carotid:  No occlusion, high-grade stenosis, or vascular injury. Vertebrobasilar: Hypoplastic right vertebral artery. No occlusion, high-grade stenosis, or vascular injury. Soft tissues: Normal. Bones: Small left mastoid effusion. IMPRESSION: 1. No occlusion, high-grade stenosis, or  vascular injury. *THIS STUDY HAS NOT BEEN REVIEWED BY AN ATTENDING RADIOLOGIST* Preliminary Report Electronically Signed By: Miguel Rota on 06/26/2017 3:01 PM    Dx Chest 1 View    Result Date: 06/26/2017  DX CHEST 1 VIEW EXAM DATE: 06/26/2017 2:25 PM COMPARISON: 05/03/2015 INDICATION: Signs/Symptoms or Diagnosis: Stroke  Special Instructions: FINDINGS: Portable semierect AP view. Minimal atheromatous changes of the aorta. Otherwise the cardiomediastinal structures appear normal. Clear lungs. Sharp costophrenic angles. No acute skeletal findings. IMPRESSION: 1. Negative portable chest radiograph Final Report Electronically Signed By: Trilby Drummer, M.D. on 06/26/2017 2:44 PM      EKG (reviewed and interpreted independently by me): The rhythm is sinus, rate 87, axis normal, no ST/T changes.    Consults: A Consult was obtained from the Neuro service to evaluate for stroke. Consults Neuro: In ED (06/26/17 1352)    ED Medication Administration through 06/26/2017 1517     Date/Time Order Dose Route Action    06/26/2017 1436 Iohexol (OMNIPAQUE) 350 mg/mL Injection 125 mL 125 mL IV Given        Chart Review: I reviewed the patient's prior medical records. Pertinent information that is relevant to this encounter include 12 visits in 2019.        PATIENT SUMMARY  46yoM p/w acute onset CP in the setting of syncope with neurologic deficits concerning for a stroke. 944 activation, code stroke called shortly after arrival due to the above deficits on exam. EKG reassuring, continued having acute chest pain, first troponin 7. He has a long history of ED visits for similar chest pain symptoms but has no known cardiac history. Neuro at bedside soon after  arrival, CTA head and neck reassuring. Added a CTA chest for PE given his syncope and to assess aorta given his chest pain, which was negative for PE or dissection. Neurology also ordering a DWI MRI and will reassess afterwards. Will sign out to oncoming team (Drs. Smith Mince and Jimmie Molly) at  3:15pm. Suspect he will be discharged home after his CP work-up is complete.        LAST VITAL SIGNS:  Temp: 36.7 C (98.1 F) (06/26/17 1415)  Temp src: Oral (06/26/17 1415)  Pulse: 76 (06/26/17 1430)  BP: 111/70 (06/26/17 1430)  Resp: 16 (06/26/17 1430)  SpO2: 98 % (06/26/17 1430)  Weight: 83.9 kg (185 lb) (06/26/17 1329)      Clinical Impression:   Chest pain  Parasthesias      Disposition: Pending      PATIENT'S GENERAL CONDITION:  Fair: Vital signs are stable and within normal limits. Patient is conscious but may be uncomfortable. Indicators are favorable.      Electronically signed by: Dionicio Stall, MD, Resident         This patient was seen, evaluated, and care plan was developed with the resident.  I agree with the findings and plan as outlined in our combined note.    Robyn Haber, MD      Electronically signed by: Robyn Haber, MD, Attending Physician

## 2017-06-26 NOTE — ED Triage Note (Addendum)
BIBA, report from medics. Hx MI, Stroke, HTN, HLP.  Takes Aspirin 81 mg. Pt just finished lunch, states he was walking, became dizzy, states "I remember going down on one knee, next thing I remember waking up on the ground." Pt given 324 mg Aspirin and Nitro sublingual x 2 today by EMS, 12 lead negative for STEMI per medics. Pt states chest pain down from 10 to 8, states chest pain started approx 30 minutes ago.  Pt currently c/o extreme dizziness, left sided facial tingling, and left arm tingling, all new since 40 minutes ago per pt. Moves all extremities w/ good strength, PEARL, AxO x4. Blood sugar 129.  944 paged, pt to room.

## 2017-06-26 NOTE — Rapid/Action RN Consult (Signed)
Rapid response nurse called to ED pod D room 5 @1347  per stroke code , upon arrival to ED, pt in CT scan at 1350. While on CT scan table, pt c/o chest pain, not assoc with SOB. Neurology team present in scan as well. Assessment on going. At this time pt in a non TPA candidate. 11774 HR 87 R 18     Rapid Response Note: Stroke Alert    Date of Stroke Alert: 06/26/2017    Patient Name: Nicholas Krueger MRN: [id number]    Service: Emergency Medicine Room:     Last Known Well Time:earlier this afternoon    Actions/Interventions:CT scan, Accu check    ~~~~~~~~~~~~~~~~~~~~~~~~~~~~~~~~~~~~~~~~~~~~~    The Rapid Response Team remains available to provide clinical nursing support as needed.     Celesta AverAnne Taichi Repka , RN BSN  Clinical Resource Nurse  Action / Rapid Response Team    Phone: [phone]  Vocera: "Action Pascha Fogal"

## 2017-06-26 NOTE — Discharge Instructions (Addendum)
You have been evaluated and treated for chest pain and stroke-like symptoms. You are not having a stroke or a heart attack. You need to follow up with your primary care doctor within 2 days. You continue to have episodes of severe chest pain that have not led to any significantly serious diagnosis. You have had at least 12 emergency department visits in 2019 alone for similar symptoms. You would be better served by an established primary care doctor than the group of emergency departments who are caring for you.     You are being discharged out of the emergency department. Please take all medicines that are prescribed to you as directed.  It is crucial, if you have a primary care physician, to follow up with him or her in the time frame recommended as many health conditions that seem self-limited initially may actually worsen over time.  If you do not have a primary care physician, we will outline the various resources available for you to find one. If at any time you feel that your condition is worsening, call your doctor or return to the emergency department for reevaluation.      Pharmacies  Should you need to obtain any prescription or over the counter medications the following 24- hour pharmacies are available in the [place] area.    [facility] - [address], [place] [facility] - [phone]  [facility] - [address] [place] - [phone]  [facility] - [phone] [facility] - [phone]    After your ED visit  - Resume all your normal home medications as directed.   - Start new discharge medications as directed.   - Do not drive or operate machinery while taking narcotic (opiates) pain medications or other sedating drugs (e.g. Benadryl).   - You may have been prescribed a pain medicine(s) that contains Tylenol (acetaminophen). If you are taking other Tylenol containing medicines at home, be sure NOT to exceed 3 grams (3000 milligrams) of Tylenol per 24 hour period.    Laboratory/Imaging  Results  Please realize that the results of some studies that you had done during your stay with us (such as x-rays and blood/urine cultures) are only preliminarily resulted.  Results of these studies may change as more information becomes available or as the studies are re-evaluated by other members of our health care team in the next few days. We will attempt to contact you with any important changes or additions to the studies that were obtained today.     Thank you  Thank you for choosing [facility] for your emergency health care needs. It has been our privilege to take care of you today.

## 2017-06-26 NOTE — ED Progress Note (Addendum)
Emergency Department Progress Note    The patient was signed out to me by Dr. Unknown JimMcWade and Dr Joella PrinceMoulin at [phone].    I assumed the patient's care, reviewed the medical record, and discussed the patient's ED course with the previous treatment team. Pertinent labs and studies have been reviewed. Briefly, the patient presented with chest pain and stroke-like symptoms and was awaiting chest pain work-up and final neurology eval/recs at the time of signout.    ED Course (from time of signout):   Patient received specialized protocol MRI per neurology recs  Patient symptoms improved while in MRI - patient unable to tolerate and refused further MRI - order canceled by neurology.  Neurology resident contacted me at 2000 to inform me that imaging was normal, patient now without deficits and no evidence of stroke.  --- Chart review revealed [place] and has received tPA 10 times - never found to have a confirmed stroke - neurology believes this is psych or functional.  Patient with flat troponin - EKG without evidence of acute pathology.     Most recent vital signs are as follows:     Temp: 37 C (98.6 F) (04/12 1630)  Temp src: Oral (04/12 1630)  Pulse: 88 (04/12 1630)  BP: 99/87 (04/12 1630)  Resp: 18 (04/12 1630)  SpO2: 98 % (04/12 1630)  Height: 185.4 cm (6\' 1" ) (04/12 1329)  Weight: 83.9 kg (185 lb) (04/12 1329)    Clinical Impression:     ICD-10-CM    1. Chest pain, unspecified type R07.9    2. Stroke-like symptoms R29.90      Disposition: Discharge. Follow up with PCP. ED discharge instructions were reviewed and provided.      PATIENT'S GENERAL CONDITION:  Fair: Vital signs are stable and within normal limits. Patient is conscious but may be uncomfortable. Indicators are favorable.       Electronically signed by: Unk PintoAndrew J Branting, MD, Resident        This patient was seen, evaluated, and care plan was developed with the resident.  I agree with the  findings and plan as outlined in our combined note. I personally independently visualized the images and tracings as noted above.      Hal HopeMary Lai Saagar Tortorella, MD      Electronically signed by: Hal HopeMary Lai Tyray Proch, MD, Attending Physician

## 2017-06-26 NOTE — ED Nursing Note (Signed)
AVS given to pt. Explained to pt that he needs to set up w/ PMD instead of going to ED. Has 19 ED visits this year alone.     Pt upset b/c favorite black T-shirt was lost while in ED.     ED discharge meal given to pt.     Lyft ordered for pt.     Ambulated out of ED, stable condition and steady gait.

## 2017-06-26 NOTE — Nurse Focus (Signed)
Clinical Stroke Coordinator  Note Started: 06/26/2017  14:21    Stroke Alert paged at 1348     Arrived to bedside at 1351     POC Blood Glucose: 129    Last Date Known well 06/26/2017    Last Time known well 1300     Neurology Doctor Date / Time of Initial Assessment: 06/26/2017 at 1350    EKG Time: See EMR    Chest X-Ray Time: See EMR        Patients Weight: Weight: 83.9 kg (185 lb) (06/26/17 1329)    Did the patient receive tPA (either at [facility] or another facility, if NO use the non-TPA template)?  No          Did patient undergo Interventional Neuroradiology procedure (Evaluation for thrombectomy or intraarterial therapy including IA tPA)?  No             Date / Time IR Noted: N/A    Summary: Stroke alert received. Arrived to ED D5 with patient complaining of chest pain, dizzy and left sided weakness. CTA head and neck completed. Returned to D5 with continued assessment by Neurology. Pt. A/O x 4 and appearing to move all equally. Not a TPA or IR candidate at this time. Bedside RN made aware.

## 2017-06-26 NOTE — ED Nursing Note (Signed)
Assume care, report rec'd from Courtney, RN.

## 2017-06-27 LAB — HEPATITIS C ANTIBODY (EXT): HEPATITIS C ANTIBODY (EXT): NONREACTIVE

## 2017-06-27 LAB — HIV ANTIGEN ANTIBODY CARE EVERYWHERE: HIV 1/2 PLUS O AG/AB SCREEN CARE EVERYWHERE: NONREACTIVE

## 2017-06-28 ENCOUNTER — Emergency Department
Admission: EM | Admit: 2017-06-28 | Discharge: 2017-06-30 | Disposition: A | Discharge status: Home / Self Care | Payer: Medicaid Other | Attending: Medical Toxicology | Admitting: Medical Toxicology

## 2017-06-28 DIAGNOSIS — F29 Unspecified psychosis not due to a substance or known physiological condition: Secondary | ICD-10-CM

## 2017-06-28 DIAGNOSIS — F209 Schizophrenia, unspecified: Principal | ICD-10-CM | POA: Insufficient documentation

## 2017-06-28 NOTE — Allied Health Progress (Signed)
CLINICAL SOCIAL SERVICES   CRISIS SERVICES PROGRESS NOTE  Note Date and Time: 06/28/2017    21:53  Date of Admission: 06/28/2017  9:33 PM    Patient Name: Nicholas LevoMark [name] Greenwell Referral Source: RN   DOB: 11/07/1970  Age: [age]yr     PES aware of pt, he will be added to the queue.      Signature: Dorena DewRegina Courtlynn Holloman, LCSW      Pager # [phone]  Licensed Clinical Social Worker

## 2017-06-28 NOTE — Allied Health Progress (Signed)
CLINICAL SOCIAL SERVICES   CRISIS SERVICES PROGRESS NOTE  Note Date and Time: 06/28/2017    23:08  Date of Admission: 06/28/2017  9:33 PM    Patient Name: Nicholas Krueger Referral Source: triage    DOB: 05/17/1970  Age: [age]yr     Pt meets 5150 hold criteria for Grave Disability; full evaluation to follow.       Signature: Dorena DewRegina Hubbert Landrigan, LCSW      Pager # [phone]  Licensed Clinical Social Worker

## 2017-06-28 NOTE — ED Nursing Note (Signed)
Regina, LCSW aware of pt.

## 2017-06-28 NOTE — ED Triage Note (Signed)
Pt ambulatory with steady gait. Pt states "I don't feel like myself." Pt denies physical pain. States he is hearing voices all the time and is suicidal. Voices are telling pt that he should be with his mom. Pt does have homicidal thought towards his father. States, "but he's in the penitentiary."  Pt states will attempt to OD on sleeping pills. Pt is calm and cooperative. Pt denies HA, CP, abd pain or SOB. Even unlabored resp. Bilat lungs CTA. PES paged.

## 2017-06-28 NOTE — Allied Health Progress (Signed)
CLINICAL SOCIAL SERVICES   CRISIS SERVICES PSYCHIATRIC DIAGNOSTIC INTERVIEW  Note Date and Time: 06/28/2017    23:24  Date of Admission: 06/28/2017  9:33 PM    Date of Service: 06/28/2017 Referred By: MD   Patient Name: Nicholas Krueger Ethnicity: White    DOB: 1971-03-09  Age: 47yr      Referral Source: Emergency Department Attending  Reason for Referral: Psychiatric evaluation  Interpreter Assisting with Interview: none  Persons Interviewed: pt, chart review, medical team, AVATAR    Next of Kin:   Other contacts:     HISTORY OF PRESENTING ILLNESS  Pt is a 47yo White male BIB self with SI and AH telling him to "go be with his mother" also reporting HI to father who is incarcerated.  Pt has not been assessed by PES in the past but was seen two days ago @ this ED; per Neurology resident note dated 4/12:"History of multiple identical events, in August admitted for psychotic episode then developed same L weakness as he presented with today. Dr. Raj Janus was able to obtain records from [place] from a presentation there Jan 25, 2016, where there was documentation of him presenting with similar symptoms 20 times to different places in the [place], received IV tPA 10 times"  He was seen at an OSH on 4/10 for chest pain with normal EKG and troponin negative X2. He has frequent ER visits for multiple of symptoms including chest pain, suicidal ideation, hallucinations"     PAST PSYCHIATRIC HISTORY(include family)  Per care everywhere, pt has presented to @ least 48 hospitals on [address] and [place].  Pt reports that he spent 14 years @ [facility], when asked to clarify, he reported from age [age].  AVATAR reports that pt was hospitalized for a couple days @ [place] in 2013  PAST MEDICAL HISTORY  Per chart review, pt presented two days ago (06/26/17) with complaints of Left arm, left leg weakness, left arm and left leg numbness and left face numbness as well as chest pain, syncopal and double vision. 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 Conservator, payee, parent, etc.): unknown  Patient's Contact Info:     Family involved: unknown  Support system: pt reports that his mother is deceased and father is incarcerated   Patient's education/occupation: disabled   Building services engineer: unknown  Insurance: Medi-Cal/[employer]  Agencies Involved: none     MENTAL STATUS EXAM  Alert: variable  Oriented: Person  Appearance: pt is a red haired average sized male, disheveled   Memory: Long-term Intact:  no  Behavior: calm  Attitude: suspicious  Eye Contact: minimal  Affect: blunted  Mood: appropriate  Speech: halting and incoherent  Ability to Communicate: impaired  Concentration: impaired  Comprehension: impaired  Insight: impaired  Judgment: impaired  Decision Making Capacity: impaired  Impulse Control: fair  Intellectual Functioning: normal  Thought Process: preoccupied  Thought Content: hallucinations:  auditory  Current Suicidal Ideation: no Plan:    Current Homicidal Ideation: no  Plan:      ASSESSMENT  Pt presents disorganized, disoriented and a poor historian.  When asked about  his little history in the AVATAR system, [place], when asked if he'd [facility] been hospitalized [facility], he reported "yes, for 46 years" when asked if he was transferred from prison, he reports "from age [age]", nodding off throughout assessments with some incoherent answers. Per care everywhere; pt has presented to @ least 48 [place] [address] [place]. Pt is unable to verbalize a viable plan for self care and meets 5150 hold criteria for  GD.  Ability to cope with current situation: poor  Ability to utilize resources: fair (appears to travel to multiple hospitals via bus)  Willing to agree to plan for patient's safety: no  Adequate support system: no  Patient meets criteria for 51/50: yes: Gravely Disabled  [place] Issue: no     DSM-V Diagnosis   Scizophrenia spectrum h/o Conversion d/o               PLAN/DISPOSITION  Consulted with medical team: yes  Notified: n/a  Referrals Provided: None  Counseling Provided by [employer] Social Worker re: just psych eval  Patient Disposition: Patient may be discharged when medically ready to:   Psychiatric Hospitalization ( involuntary):  tbd    Signature: Dorena DewRegina Billie Intriago, LCSW   Pager # [phone]  Licensed Clinical Social Worker

## 2017-06-29 LAB — ACETAMINOPHEN

## 2017-06-29 NOTE — ED Nursing Note (Signed)
Pt relocated to A pod hallway. Pt is upset about Posey belt on gurney, states that is was placed there "for no reason." MHW working to get hospital bed for patient, sandwich box given to patient.

## 2017-06-29 NOTE — ED Nursing Note (Signed)
Report received from Eric, RN.

## 2017-06-29 NOTE — Clinical Case Management (Signed)
Emergency Room Clinical Case Management Psych Assess Note  Name: Luretha MurphyMark [name] Edberg                MRN: [id number]  Date of Birth: 04/12/1970                   Age:[age]yr             Gender:male  Date: 06/29/2017                                                             Initial Note Time: 08:27  [place]:  [address]  [place] [id number]                                                                                                                                            Date /Time of Admit: 06/28/2017  Reason for 5150 Hold: GD  Date/Time of 5150: 06/28/2017  0046  Date/Time 5150 expires: 07/01/2017  0046  Date/Time of Medical Clearance: 06/29/2017  1530  Insurance: Medi-Cal Pending- [facility]    18:48- Medically cleared for admission to Inpatient Psychiatric treatment facility.  [facility] and [facility].     Tawni Carnesoger Montavius Subramaniam, RN  Clinical Case Manager  Pager [phone]- [phone]  Phone - [phone]

## 2017-06-29 NOTE — ED Nursing Note (Signed)
Assumed care report received from Fiona,RN

## 2017-06-29 NOTE — ED Nursing Note (Signed)
Report given to Lorna,RN.

## 2017-06-29 NOTE — ED Nursing Note (Signed)
Unable to do full assessment, refused, He said : everything is fine".   He is upset that he had not been seen by psychiatry.   Says: " I am hungry"  Milk and crackers provided.

## 2017-06-29 NOTE — ED Nursing Note (Signed)
Tommy, drug counselor at bedside , talking to patient.

## 2017-06-29 NOTE — ED Nursing Note (Signed)
Patient provided hospital bed.

## 2017-06-29 NOTE — Allied Health Progress (Signed)
Patient continues to get off gurney- wants to be "discharged'- minimizes sxs that brought him to the ED. Reminded multiple times that he is on a 5150 Hold and needs to remains on his gurney. Informed internal RN that he needs a lap belt.     Patient remains on a 5150 Hold. Patient is a flight risk.     Tomasita CrumbleAlison Arleny Kruger, DBH, LCSW
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
## 2017-06-29 NOTE — ED Nursing Note (Signed)
Pt. Awake, a/ox4, sitting upright in gurney.  Pt. Upset due to pt. Stated wants to be discharged as pt. Came to ED "voluntarily".  Flow RN notified of pt concerns.

## 2017-06-29 NOTE — ED Nursing Note (Signed)
Pr argumentative and agitated continues to state he wants to leave, increasingly agitated when trying to apply rollbelt, moved to A pod for increased visibility so he won't have to be restrained
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
## 2017-06-30 DIAGNOSIS — F12959 Cannabis use, unspecified with psychotic disorder, unspecified: Secondary | ICD-10-CM

## 2017-06-30 DIAGNOSIS — R45851 Suicidal ideations: Secondary | ICD-10-CM

## 2017-06-30 DIAGNOSIS — R440 Auditory hallucinations: Secondary | ICD-10-CM

## 2017-06-30 DIAGNOSIS — R45850 Homicidal ideations: Secondary | ICD-10-CM

## 2017-06-30 NOTE — ED Progress Note (Signed)
Emergency Department Psychiatric Progress Note    ID: pt has been placed on 5150 for GD.   I reviewed the medical record, and discussed the patient's ED course with the treatment teams.  Pertinent labs and studies have been reviewed.     S: Pt has no complaints.     O: Most recent vital signs are as follows:  Temp: 36.7 C (98.1 F) (04/15 2200)  Temp src: Oral (04/15 2200)  Pulse: 54 (04/15 2200)  BP: 114/63 (04/15 2200)  Resp: 20 (04/15 2200)  SpO2: 99 % (04/15 2200)  Height: 185.4 cm (6\' 1" ) (04/14 2138)  Weight: 86.6 kg (190 lb 14.7 oz) (04/14 2138)  Well appearing male in no apparent distress   Breathing comfortably  Sitting up on edge of bed  Telling nurse he is here due to misunderstanding and wants to leave  cooperative with exam.     A/P: Pt stable. Defer to SW/psychiatry for management of mental health disorder.     Electronically Signed By:    Exie ParodyJennifer Faten Frieson, MD  [facility] Emergency [facility]  Pager: [phone]
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
## 2017-06-30 NOTE — ED Nursing Note (Signed)
Patient calm and cooperative. Slept well the night not in distress. No episode of SI . Ambulates to bathroom and back to bed with steady gait. Will continue to monitor patient

## 2017-06-30 NOTE — Discharge Instructions (Addendum)
You were evaluated and treated today for Schizophrenia.   Follow the instructions of the Psychiatrists, including any medications.  Avoid tobacco, alcohol and drug use.  Follow up with your Primary Doctor within 7 days.   If you are not able to arrange timely follow-up with a primary doctor yourself, please call [place] [facility] representative at [phone] during regular business hours for assistance in obtaining a follow-up appointment.  Return to ER for any worsening symptoms, including fevers, worsening pain, persistent vomiting, difficulty breath, new rash, thoughts of hurting yourself or others, or other concerning symptoms.      What Are The Warning Signs For Suicide?  The following signs may mean someone is at risk for suicide. The risk of suicide is greater if a behavior is new or has increased and if it seems related to a painful event, loss, or change. If you or someone you know exhibits any of these signs, seek help as soon as possible by calling the Lifeline at 1-800-273-TALK (8255).   Talking about wanting to die or to kill oneself.    Looking for a way to kill oneself, such as searching online or buying a gun.    Talking about feeling hopeless or having no reason to live.    Talking about feeling trapped or in unbearable pain.    Talking about being a burden to others.    Increasing the use of alcohol or drugs.    Acting anxious or agitated; behaving recklessly.    Sleeping too little or too much.    Withdrawing or feeling isolated.    Showing rage or talking about seeking revenge.    Displaying extreme mood swings.      Thank you for choosing [facility] for your emergency health care needs. It has been our privilege to take care of you today. Your primary complaints have been evaluated based on your history, lab tests, imaging tests and you have been treated for your symptoms and discharged home. Please take all medicines that are prescribed to you as  directed (see below).  It is crucial to follow up with your Primary Doctor in the time frame recommended as many health conditions that seem self-limited initially may actually worsen over time.  If you do not have a primary care physician, we will outline the various resources available for you to find one.    If at any time you feel that your condition is worsening, call your doctor or return to the Emergency Department for reevaluation. CALL 911 IF YOU THINK YOU ARE HAVING A MEDICAL EMERGENCY. Return to the Emergency Department if you are unable to obtain the recommended follow-up treatment or you are not better as expected. You can call the Medical Center with questions at [phone].    Please realize that the results of some studies that you had done during your stay with us (such as x-rays and cultures) have only preliminarily results at this time.  Results of these studies may change as more information becomes available or as the studies are re-evaluated by other members of our health care team in the next few days. We will attempt to contact you with any important changes or additions to the studies that were obtained today, particularly if any of these results require a change in your treatment.      GENERAL PAIN MEDICATION PRECAUTIONS    You may have been prescribed medications for pain today. Some of these may contain a narcotic (  Vicodin, Norco, Percocet, etc.) Take these pain medications as prescribed. You also may have been prescribed a muscle relaxant or anxiolytic (benzodiazepine) such as valium (diazepam) or ativan (lorazepam). Do not drink alcohol, drive, or operate heavy machinery while taking either narcotic or benzodiazepine medications. All narcotics and benzodiazepines have a risk of dependence, so please use with caution. If you were prescribed Vicodin, Norco, or Percocet, do not take additional products containing Tylenol (acetaminophen), as this can cause an acetaminophen overdose and  can damage your liver. Do not take more than 4000mg  of acetaminophen daily.    You may also have been prescribed an anti-inflammatory pain medication (NSAID) such as ibuprofen (Motrin, Advil) or naproxen (Aleve). Take the NSAID as prescribed, with food or milk.  Stop taking the NSAID if you develop abdominal pain, vomiting blood or dark/tarry or bloody stools. Be sure to drink plenty of fluids, at least 1-2 liters of water per day while taking an NSAID unless you have congestive heart failure or other condition that requires you to limit your water intake.    You may fill any prescriptions at a pharmacy of your choice. Please take these as directed.        When should you call for help?  Call 911 anytime you think you may need emergency care. For example, call if:   You feel you cannot stop from hurting yourself or someone else.   Someone who has psychosis displays dangerous behavior, and you think the person might hurt himself or herself or someone else.  Call your doctor now or seek immediate medical care if:   A person with psychosis mentions suicide. If a suicide threat seems real, with a specific plan and a way to carry it out, you should stay with the person, or ask someone you trust to stay with the person, until you get help.   A person who has psychosis:   Starts to give away his or her possessions.   Uses illegal drugs or drinks alcohol heavily.   Talks or writes about death, including writing suicide notes and talking about guns, knives, or pills.   Starts to spend a lot of time alone.   Acts very aggressively or suddenly appears calm.   You hear voices or think you see things that are not there.   You have a sudden change in behavior.   You have difficulty taking care of yourself or become confused doing simple chores or tasks.  Watch closely for changes in your health, and be sure to contact your doctor if:   Your symptoms repeatedly upset your daily activities.   You have symptoms of  psychosis that are new or different from those you had before.      [place] 24 hour [employer]  [address], [place] (will fill [place] after hours)  [address], [facility]   [phone]    [facility]  [address], [place], [place] [address]  [phone]     [facility] (7am-11pm)  [address], [place], [place] [address]   [phone]       [facility], [address] [place], [place]  [phone], [address], [place], [place] [phone] (Sunday 8a-2p)    (Last updated 08/04/15)      [facility] Referral List  [facility], Hours 8:00 am to 5:00 pm  [place] - [address], [place], [place] [address]  Ph. [phone]  [place] - [address] [place], [place] [address] Ph. [phone]  [place] - [address], [place], [place] [address] Ph. [phone]  [facility] Ph.[phone] (Mon-Wed-Fri. 5-8pm, Sat & Sun 10am - 4pm)  [place] - [address], [address], Ph. [phone]  [place] - [address]  Ph. [phone]  [place] - [address], [address]  Ph. [phone]  [place] - [address], [place], [place] [address]  Ph. [phone]    [facility]     [address] [place], [place] [phone]  or     [address] [place], [place]  [phone]  or     [address] [place], [place]  [phone]  [facility] [phone]                [address],  [place], [place] [phone]     [address], [place], [place]  [phone]       [address]  [place], [place]  [phone]    [facility], [address], [place], [place] [address]  [phone]               [facility], [address], [place], [place] [address]  [phone]  [place] [facility], [address] [place] [address], [place], [place] [address]   [phone]  [facility], [address], [place], [place] [address]  [phone]  [facility], [address], [place], [place] [address]  [phone]  [facility], [address], [place], [place] [address]  [phone]  [facility], [address], [place], [place] [address]  [phone]  [facility], [address] [place], [place] [address]  [phone]    [facility]  [facility], [address], [place], [place]  [phone]  [place], [address] [place], [place]  [phone]  [place], [address], [place], [place]  [phone]  [place], [address], [place], [place]  [phone]  [place], [address], [place], [place]  [phone]  24 hour "[employer] [facility]" line  [phone]    Additional Community Clinics  [facility], [address], [place], [place]  [phone]  [facility], [address], [place], [place]  [phone] (7 days a week)  [employer] [address], [place], [place]  [phone] or [phone] (5pm - 8pm, M-F)  [facility], [address] [place], [place]  [phone]  [facility], [address] [place]  [phone]  [facility], [address], [place], [place]  [phone]  [facility], [address], [place], [place]  [phone]   [facility], [address] [place], [place]  [phone]  [facility], [address], [place], [place]  [phone]  [facility], [address], [place], [place]  [phone]  [facility], [address], [place], [place]  [phone]  [facility], [address], [place], [place]  [phone]  [facility], [address], [place]  [phone]  [facility], [address], [place]  [phone]  [facility], [address], [place]  [phone]  [facility], [address]    [phone]  Pediatric Urgent   [facility], [address] [facility] [address] [place], [place], [address] [place], [place]  [facility], [address] [place], [place], [address], [place], [place]  [place], [place] [address]  [phone]  [facility], [name], [place], [place]  [facility], [place], [place], [place]  [facility], [address] [place], [place]  [facility], [place], [place], [place] for [facility], [place]. [facility], [address] [place], [place], [address] [place], [place], [place]  [place], [place]. [phone]   [facility], [facility], [place], [place], [place]

## 2017-06-30 NOTE — ED Nursing Note (Signed)
Patient discharged from ED with AVS,  related instructions and all belongings. Patient is in NAD, is awake/alert skin, is pink/warm/dry. Vss. No peripheral iv. Pt leaves the ED ambulatory with self.

## 2017-06-30 NOTE — ED Nursing Note (Signed)
Shift change report given to Lauren,RN

## 2017-06-30 NOTE — Clinical Case Management (Addendum)
Emergency Room Clinical Case Management Psych Assess Note  Name: Nicholas MurphyMark [name] Krueger                MRN: [id number]  Date of Birth: 12/26/1970                   Age:[age]yr             Gender:male  Date: 06/30/2017                                                             Initial Note Time: 08:56  Permanent address:  [address]  [place] [id number]                                                                                                                                            Date /Time of Admit: 06/28/2017  Reason for 5150 Hold: GD  Date/Time of 5150: 06/28/2017  0046  Date/Time 5150 expires: 07/01/2017  0046  Date/Time of Medical Clearance: 06/29/2017  1530  Ins[place] [facility]  Barriers: "History of multiple identical events, in August admitted for psychotic episode then developed same L weakness as he presented with today. Dr. Raj Janusoil was able to obtain records from [place] from a presentation there Jan 25, 2016, where there was documentation of him presenting with similar symptoms 20 times to different places [place] the [place], received IV tPA 10 times"  He was seen at an OSH on 4/10 for chest pain with normal EKG and troponin negative X2. He has frequent ER visits for multiple of symptoms including chest pain, suicidal ideation, hallucinations"    0930- Patient received in morning rounds and remains on 5150 Hold. Medically cleared for admission to Inpatient Psychiatric treatment facility.  [facility] patient due to medical concerns.     11:17- Per Psychiatry, patient released from 5150 Hold and to discharge to home.     Tawni Carnesoger Chamari Cutbirth, RN  Clinical Case Manager  Pager [phone]- [phone]  Phone - [phone]

## 2017-06-30 NOTE — Nurse Focus (Signed)
Psych MD's at bedside speaking with patient.

## 2017-06-30 NOTE — Nurse Focus (Signed)
Patient repeatedly asking when the Doctors will be here to see him. Patient agitated stating "I don't understand why I am still here" and "I can provide food, I can bathe myself, I can take care of myself I don't meet criteria for GD." Patient denies SI/HI. Patient updated regarding plan of care. NAD. Will continue to monitor.

## 2017-06-30 NOTE — Progress Notes (Signed)
Lifting 5150 hold. Full note to follow.    Cindra PresumeAngela Isai Gottlieb, MD  Department of Psychiatry  [facility]  PGY2  [phone]

## 2017-06-30 NOTE — Nurse Focus (Signed)
Report received from Lorna,RN

## 2017-07-08 LAB — ELECTROCARDIOGRAM WITH RHYTHM STRIP: QTC: 442

## 2017-07-21 ENCOUNTER — Emergency Department
Admission: EM | Admit: 2017-07-21 | Discharge: 2017-07-22 | Disposition: A | Discharge status: Home / Self Care | Payer: MEDICAID | Attending: Emergency Medicine | Admitting: Emergency Medicine

## 2017-07-21 DIAGNOSIS — F4310 Post-traumatic stress disorder, unspecified: Principal | ICD-10-CM | POA: Insufficient documentation

## 2017-07-21 DIAGNOSIS — R45851 Suicidal ideations: Secondary | ICD-10-CM | POA: Insufficient documentation

## 2017-07-21 DIAGNOSIS — F1220 Cannabis dependence, uncomplicated: Secondary | ICD-10-CM

## 2017-07-21 NOTE — ED Triage Note (Signed)
Pt ambulatory to triage. A/ox4, here to get help for the voices he is hearing. States the voices are tewling him he should be with his deceased mother. Endorses scenarios of death are running through his head today, defines no specific plan. Pt alert and cooperative in triage. States he is prescribed psych meds but they don't work so he is not taking them. Notes childhood traumas. ED arrival team called.

## 2017-07-22 ENCOUNTER — Encounter: Payer: Self-pay | Admitting: Student in an Organized Health Care Education/Training Program

## 2017-07-22 DIAGNOSIS — R441 Visual hallucinations: Secondary | ICD-10-CM

## 2017-07-22 DIAGNOSIS — Z915 Personal history of self-harm: Secondary | ICD-10-CM

## 2017-07-22 DIAGNOSIS — R440 Auditory hallucinations: Secondary | ICD-10-CM

## 2017-07-22 DIAGNOSIS — F259 Schizoaffective disorder, unspecified: Secondary | ICD-10-CM

## 2017-07-22 NOTE — ED Nursing Note (Signed)
Psychiatry team at bedside, talking to patient.

## 2017-07-22 NOTE — ED Nursing Note (Signed)
Pt is in all green scrubs, no personal belongings noted near/on pt. Pt denies SI at this time and denies auditory hallucinations, pt given bag lunch per request. Pt calm and cooperative with vitals.

## 2017-07-22 NOTE — ED Nursing Note (Signed)
Report received from [facility], patient to room A01 in NAD, care assumed.

## 2017-07-22 NOTE — Progress Notes (Signed)
Brief note, full note to follow. Drop 5150 DTS, patient no longer suicidal, plan to live on street vs homeless shelter, food loaves fishes. Will call 911, 211 or return to urgent services if SI returns. No medications prescribed. Resources on AVS.    Edson Snowball, MD

## 2017-07-22 NOTE — ED Nursing Note (Signed)
Patient is standing by the door, demanding for his property right at this moment . [facility] already went to get his property , this RN , and another [facility]  keep explaining to patient that they are in process of getting his property, patient is not very patience .   Security at bedside, trying to talk to patient.

## 2017-07-22 NOTE — ED Nursing Note (Signed)
Patient has his belongings , changing . Calm at this time

## 2017-07-22 NOTE — Discharge Instructions (Addendum)
Please follow up with your primary care provider in the next 1-2 days.  Please return if you have thoughts of hurting yourself or others.    [facility] [place] Peer Support Groups  [facility] - Central  Sundays from 7:00 - 8:30 pm: [facility] for Psychiatry, [address] [place].  [facility] - South  Sundays 7:00 - 8:30 pm: [employer], [address] of [place], [address].  [facility] - [facility] (Evening)  Available 04/22/17 - 07/29/17 only  Wednesdays 5:45 - 6:45 pm: [employer], Room [address]. [address] Wilma Flavin.  [facility] - [facility] (Day)  Available 04/28/17 - 08/04/17 only  Tuesdays 12:15 - 1:15 pm: [employer]. [address] Wilma Flavin.  We also have peer-led support groups hosted by nearby [facility]:  [facility] - [place]  Thursday 12:00 - 1:30 pm  Community Room - [facility]  [address]  Earlene Plater [phone]  [facility] Support Group - [facility]  Friday 10:00 - 11:30  [facility] Room  [address] [address]

## 2017-07-22 NOTE — Allied Health Progress (Signed)
CLINICAL SOCIAL SERVICES   PES Referral  Note Date & Time: 07/22/2017 00:27  Date of Admission: 07/21/2017 10:59 PM    Date of Service: 07/22/2017  Referred By: medical team   Patient Name: Nicholas Krueger Ethnicity: The patient identifies as Not Hispanic or Latino.        DOB: 1971/02/12  Age: 32yr    INTERVENTION/PLAN  Consulted with Triage RN; Patient was referred to [facility] for psychiatric evaluation.       Following the psychiatric interview, the Pt meets criteria for 5150 DTS.  Full PES eval to to follow.       Electronically signed by: Ernesto Rutherford, LCSW    Pager [phone]  Licensed Clinical Social Worker

## 2017-07-22 NOTE — ED Nursing Note (Signed)
Report given to Fiona care transferred.

## 2017-07-22 NOTE — ED Nursing Note (Signed)
Patient standing by the door , arm crossed, saying  "it's 10 o'clock , doctor said, they will come and talk to me at 10, I have stuff to do, I have work". Informed patient that doctors will come and talk to him , and he just needs to be patience.   Now back in his room , and watching TV.

## 2017-07-22 NOTE — ED Nursing Note (Signed)
Report received from Diana,RN
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
## 2017-07-22 NOTE — Case Management (ED) (Signed)
Name: Nicholas Krueger MRN: [id number]      Clinical Case Management Progress Note    Comments: 5150 hold lifted per Psychiatry.  Patient discharged to self.    Further follow-up needed: as above    Date: 07/22/2017  Time: 15:53    Electronically Signed by:  Leanord Hawking, RN, RN  Pager: [phone]

## 2017-07-22 NOTE — ED Notes (Signed)
Report given to [facility], pt transported to A pod.

## 2017-07-22 NOTE — Allied Health Progress (Signed)
CLINICAL SOCIAL SERVICES   CRISIS SERVICES PROGRESS NOTE  Note Date and Time: 07/22/2017    06:55  Date of Admission: 07/21/2017 10:59 PM    Patient Name: Nicholas Krueger Referral Source: [facility] PES   DOB: [birth year]  Age: 64yr     Pt is medically cleared to be transferred to a inpatient psychiatric facility. Pt has [facility] pending Medi-Cal. Pt's placement packet was faxed to [facility] for placement consideration.       Signature: Coral Spikes, LCSW      Pager # [phone]  Licensed Clinical Social Worker
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
## 2017-07-24 ENCOUNTER — Emergency Department
Admission: EM | Admit: 2017-07-24 | Discharge: 2017-07-24 | Discharge status: Against Medical Advice | Payer: MEDICAID | Attending: Emergency Medicine | Admitting: Emergency Medicine

## 2017-07-24 ENCOUNTER — Emergency Department
Admission: EM | Admit: 2017-07-24 | Discharge: 2017-07-25 | Disposition: A | Discharge status: Home / Self Care | Payer: MEDICAID | Attending: Emergency Medicine | Admitting: Emergency Medicine

## 2017-07-24 DIAGNOSIS — F1220 Cannabis dependence, uncomplicated: Secondary | ICD-10-CM | POA: Insufficient documentation

## 2017-07-24 DIAGNOSIS — M542 Cervicalgia: Principal | ICD-10-CM | POA: Insufficient documentation

## 2017-07-24 DIAGNOSIS — R0789 Other chest pain: Secondary | ICD-10-CM

## 2017-07-24 DIAGNOSIS — F17210 Nicotine dependence, cigarettes, uncomplicated: Secondary | ICD-10-CM | POA: Insufficient documentation

## 2017-07-24 DIAGNOSIS — R45851 Suicidal ideations: Secondary | ICD-10-CM | POA: Insufficient documentation

## 2017-07-24 DIAGNOSIS — F4310 Post-traumatic stress disorder, unspecified: Principal | ICD-10-CM | POA: Insufficient documentation

## 2017-07-24 HISTORY — DX: Suicidal ideations: R45.851

## 2017-07-24 HISTORY — DX: Essential (primary) hypertension: I10

## 2017-07-24 LAB — POC GLUCOSE: POC GLUCOSE: 126 mg/dL

## 2017-07-24 MED ORDER — OLANZAPINE 10 MG DISINTEGRATING TABLET
10.0000 mg | DISINTEGRATING_TABLET | Freq: Once | ORAL | Status: AC
Start: 2017-07-24 — End: 2017-07-25
  Administered 2017-07-25: 10 mg via ORAL
  Filled 2017-07-24: qty 1

## 2017-07-24 NOTE — ED Provider Notes (Addendum)
EMERGENCY DEPARTMENT PHYSICIAN NOTE - [name]       Date of Service:   07/24/2017  8:21 PM Patient's PCP: Patient, No Pcp Per   Note Started: 07/24/2017 22:25 DOB: 30-Mar-1970         Chief Complaint   Patient presents with    Assaultive or Suicidal Behavior     The history provided by the patient and medical records.  Interpreter used: No    Garfield Coiner is a 47yr old male, who  has a past medical history of Chronic pain, Hypertension, Hypoglycemia, and Suicidal ideation., presenting to the ED with a chief complaint of suicidal ideation with an active plan that began "a few days ago." He states that he originally came to the ED because of pain, but left without being seen because he felt it was all because of his suicidal ideations. He presented at [facility] and was placed on a 5150 hold, but transferred to [facility] for medical clearance. Denies HI or hallucinations. No overdose or ingestions. Previously had documents of possible neck pain in the EMR, but the patient denies any head trauma or neck pain. Denies weakness or numbness. No pain with neck movement. + Marijuana use. Denies any pain or discomfort.      A full history, including past medical, social, and family history (as detailed in this note), was reviewed and updated as necessary.      HISTORY:  Past Medical History    Chronic pain    Hypertension    Hypoglycemia    Suicidal ideation    Allergies   Allergen Reactions    Toradol [Ketorolac Tromethamine] Nausea/Vomiting    Tramadol Nausea/Vomiting      Past Surgical History:  No date: ------------other-------------  No date: ------------other-------------   Current Outpatient Medications:     Aspirin 81 mg Delayed Release Capsule, Take 81 mg by mouth every day.      Lisinopril (PRINIVIL, ZESTRIL) 20 mg Tablet, Take by mouth.    Nitroglycerin (NITROSTAT) 0.4 mg Sublingual Tablet, Dissolve 0.4 mg under the tongue every 5 minutes if needed.     Social History     Tobacco Use    Smoking  status: Current Every Day Smoker   Substance Use Topics    Alcohol use: Not on file    Drug use: Yes     Types: Marijuana     Social History     Social History Narrative    Not on file    No family history on file.        Review of Systems   Constitutional: Negative for chills and fever.   Eyes: Negative for visual disturbance.   Respiratory: Negative for cough, chest tightness, shortness of breath and wheezing.    Cardiovascular: Negative for chest pain.   Gastrointestinal: Negative for abdominal pain, diarrhea, nausea and vomiting.   Genitourinary: Negative for dysuria, frequency and urgency.   Musculoskeletal: Negative for back pain and neck pain.   Skin: Negative for rash.   Neurological: Negative for light-headedness and headaches.   Psychiatric/Behavioral: Positive for suicidal ideas. The patient is nervous/anxious.    All other systems reviewed and are negative.         TRIAGE VITAL SIGNS:  Temp: 36.5 C (97.7 F) (07/24/17 2019)  Temp src: Oral (07/24/17 2019)  Pulse: 71 (07/24/17 2019)  BP: 107/69 (07/24/17 2019)  Resp: 16 (07/24/17 2019)  SpO2: 94 % (07/24/17 2019)  Weight: (not recorded)    Physical Exam  Constitutional: He is oriented to person, place, and time. He appears well-developed and well-nourished. No distress.   Withdrawn, intermittently agitated middle-aged male, lying on ED stretcher, no acute distress, GCS 15.   HENT:   Head: Normocephalic and atraumatic.   Right Ear: External ear normal.   Left Ear: External ear normal.   Nose: Nose normal.   Mouth/Throat: Oropharynx is clear and moist.   Eyes: Pupils are equal, round, and reactive to light. Conjunctivae and EOM are normal.   Neck: Normal range of motion. Neck supple.   No midline C-spine tenderness.   Cardiovascular: Normal rate, regular rhythm, normal heart sounds and intact distal pulses. Exam reveals no gallop and no friction rub.   No murmur heard.  Pulmonary/Chest: Effort normal and breath sounds normal. No stridor. No respiratory  distress. He has no wheezes. He has no rales. He exhibits no tenderness.   Abdominal: Soft. Bowel sounds are normal. He exhibits no distension. There is no tenderness. There is no rebound and no guarding.   Musculoskeletal: Normal range of motion.   RUE: Atraumatic, 5/5 strength, neurovascularly intact.  LEU: Atraumatic, 5/5 strength, neurovascularly intact.  RLE: Atraumatic, 5/5 strength, neurovascularly intact.  LLE: Atraumatic, 5/5 strength, neurovascularly intact.   Neurological: He is alert and oriented to person, place, and time.   Skin: Skin is warm and dry. He is not diaphoretic.   Psychiatric:   Withdrawn.  Intermittently agitated.  Positive SI.   Nursing note and vitals reviewed.         INITIAL ASSESSMENT & PLAN, MEDICAL DECISION MAKING, ED COURSE  Dayson Aboud is a 52yr male who presents with a chief complaint of suicidal ideation.     Differential includes, but is not limited to: Psychiatric disease, polysubstance abuse, homelessness     The results of the ED evaluation were notable for the following:     Pertinent lab results:   Labs Reviewed   POC GLUCOSE       Result Value    POC GLUCOSE 126         ED Medication Administration through 07/25/2017 0620     Date/Time Order Dose Route Action    07/25/2017 0031 Olanzapine (ZYPREXA ZYDIS) Rapid Dissolve Tablet 10 mg 10 mg ORAL Given        Chart Review: I reviewed the patient's prior medical records. Pertinent information that is relevant to this encounter includes previous blood pressures, labs, and allied health notes as comparison.        PATIENT SUMMARY    SMART MEDICAL CLEARANCE  Medical clearance pathway for ED patients requiring clearance prior to transfer to inpatient psychiatric facilities with minimal lab testing.   Suspect New Onset Psychiatric Condition?  No    Medical Conditions that Require Screening? No   POC Glucose, blood: No data found.    Abnormal Vital Signs?   No     Temp: 36.5 C (97.7 F) (07/24/17 2019)   Pulse: 71  (07/24/17 2019)   BP: 107/69 (07/24/17 2019)   Resp: 16 (07/24/17 2019)   SpO2: 94 % (07/24/17 2019)    Mental Status Abnormal? No     Physical Exam Abnormal? No    Risky Presentation? No   60yr (Age less than 12 or greater than 55 are exclusion from using SMART  Medical Clearance)   Possibility of ingestion? No   Eating disorders or not tolerating oral intake? No   Potential for requiring medication to treat alcohol withdrawal (daily use for  two weeks or more)? No   Ill-appearing, significant injury, prolonged struggle or "found down"? No    Therapeutic Levels Needed? No    ARE ANY OF THE ABOVE ANSWERS YES? No, this patient is cleared with SMART medical clearance    Zachery Niswander is a 67yr male who presented to the [facility] Emergency Department with a CC of suicidal ideation. ABCs stable. Vitals upon arrival  were unremarkable. Physical exam significant for mild intermittent agitation on exam but no signs of trauma or other physical exam abnormalities.  Smart cleared.  fingerstick glucose obtained given the past history of possible hypoglycemia.  In the EMR there was some concern for possible neck pain after leaning against the wall, the patient denies the symptoms.  No midline C-spine tenderness on exam with normal neck range of motion.  No indication for imaging at this time.  No numbness or weakness in the bilateral upper or lower extremities, low risk for spinal injury or trauma.     PES consulted, recommended placing the patient on a 5150 hold.     Medically clear for transfer to a psychiatric facility on a 5150 hold.       LAST VITAL SIGNS:  Temp: 36.9 C (98.4 F) (07/25/17 0533)  Temp src: Oral (07/25/17 0533)  Pulse: 55 (07/25/17 0533)  BP: 99/65(laying prone) (07/25/17 0533)  Resp: 20 (07/25/17 0533)  SpO2: 98 % (07/25/17 0533)  Weight: (not recorded)    Clinical Impression: Suicidal ideation    Disposition: 5150 (Transfer to Mental Health Facility)    PATIENT'S GENERAL CONDITION:  Fair: Vital  signs are stable and within normal limits. Patient is conscious but may be uncomfortable. Indicators are favorable.    Electronically signed by: Salome Spotted, MD, Resident    This patient was seen, evaluated, and care plan was developed with the resident.  I agree with the findings and plan as outlined in our combined note.    Misty Stanley, MD  Electronically signed by: Misty Stanley, MD, Attending Physician

## 2017-07-24 NOTE — ED Triage Note (Signed)
Pt was BIBA earlier for neck pain S/P leaning against a wall. Pt LWBT, went to [facility] for SI, was placed on 5150 hold and sent back to [facility] for medical clearance. PT with SI has hx of SI attempts.   Pt denies neck pain at this time. GCS 15, NAD

## 2017-07-24 NOTE — Allied Health Progress (Signed)
CLINICAL SOCIAL SERVICES   PES Referral  Note Date & Time: 07/24/2017 21:03  Date of Admission: 07/24/2017  8:21 PM    Date of Service: 07/24/2017  Referred By: medical team   Patient Name: Nicholas Krueger Ethnicity: The patient identifies as Not Hispanic or Latino.        DOB: 1970-05-16  Age: 81yr    INTERVENTION/PLAN  Consulted with Triage RN; Patient was referred to [name] for psychiatric evaluation.       Pt was BIBA from [facility] on a 5150 DTS hold to obtain medical clearance prior to being admitted to [facility]. Following the psychiatric interview, the Pt continues to meet criteria for 5150 hold due to DTS.  Full PES eval to to follow.       Electronically signed by: Ernesto Rutherford, LCSW    Pager [phone]  Licensed Clinical Social Worker

## 2017-07-24 NOTE — ED Triage Note (Signed)
Pt reports leaned against wall, hitting neck/head on wall. Now with neck pain. Ambulatory on scene. Denies blood thinners, GCS 15, no LOC

## 2017-07-24 NOTE — Allied Health Progress (Signed)
CLINICAL SOCIAL SERVICES   PES Referral  Note Date & Time: 07/24/2017 20:24  Date of Admission: 07/24/2017  8:21 PM    Date of Service: 07/24/2017  Referred By: medical team   Patient Name: Nicholas Krueger Ethnicity: The patient's reported ethnicity is , and he identifies as Not Hispanic or Latino.        DOB: 1970-05-22  Age: 9yr    INTERVENTION/PLAN    PES has been notified of patient's current ED visit. Patient has been added to the queue of requested evaluations and will be seen as soon as possible.      Signature: Moreen Fowler, LCSW    Pager [phone]  Licensed Clinical Social Worker

## 2017-07-24 NOTE — ED Nursing Note (Signed)
Pt LWBT went to [facility], was placed on 5150 hold and brought back by medics with different complaint
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
## 2017-07-25 ENCOUNTER — Encounter: Payer: Self-pay | Admitting: Emergency Medicine

## 2017-07-25 DIAGNOSIS — F39 Unspecified mood [affective] disorder: Secondary | ICD-10-CM

## 2017-07-25 NOTE — ED Nursing Note (Signed)
Pt ambulatory to the bathroom, gait steady.

## 2017-07-25 NOTE — ED Nursing Note (Signed)
Pt eating breakfast 

## 2017-07-25 NOTE — Clinical Case Management (Signed)
Clinical Case Management Assessments    Name: Nicholas Krueger  MRN: [id number]   Date of Birth: 1970-06-24 (67yr) Gender: male    Note Date: 07/25/2017 Note Time: 11:37       INITIAL ASSESSMENT NOTE                                                                                                                     Insurance: [place] medical ID# [id number]                                                     Date /Time of Admit: 07-24-17  2021  Date/Time of 5150: 07-24-17 2105  Date/Time of Medical Clearance: 07-25-17  0658  Medical Update if needed: none    Chief Complaint: DTS  Mental Status: a ox3  Pertinent History: PTSD, depression  Care Conference: yes  Living Situation: with friend  Contact Person: no one listed  Community Mental Health linkage: none  Functional status: independent  Placement efforts: Confirmed active referral [facility] and [place] [facility]. Following psych reassessment patient no longer meets 5150 criteria.  Discharge Plan: dc to community    Barriers to Discharge: none        Loni Beckwith RN BS ACM   Clinical Case Manager  Phone: [phone]  Pager: [phone]

## 2017-07-25 NOTE — ED Nursing Note (Signed)
Pt sleeping, no s/s of distress at this time

## 2017-07-25 NOTE — ED Nursing Note (Signed)
Pt awake and provided milk and [place] sandwich. Pt asking when he can go to psych facility and updated on hold and plan of care. Pt agreeable to plan, calm and cooperative.

## 2017-07-25 NOTE — ED Nursing Note (Signed)
Report received from Shawna RN for one hour lunch coverage

## 2017-07-25 NOTE — ED Nursing Note (Signed)
Hold lifted, pt discharged by MD, pt provided with Lyft to destination

## 2017-07-25 NOTE — Allied Health Progress (Signed)
CLINICAL SOCIAL SERVICES   BRIEF INTERVENTION  Note Date & Time: 07/25/2017    01:55  Date of Admission: 07/24/2017  8:21 PM    Date of Service: 07/25/2017  Referred By: medical team   Patient Name: Nicholas Krueger Ethnicity: The patient's reported ethnicity is , and he identifies as Not Hispanic or Latino.        DOB: 06/22/1970  Age: 43yr      INTERVENTION  Consulted with medical team; Pt has Medi-Cal Pending    Patient has been medically cleared and paperwork faxed to:    [facility]  [facility]).      DISPO/PLAN  Pt continues on 5150 hold - needs inpatient psychiatric hospitalization    Signature:   Moreen Fowler, LCSW  Clinical Social Services  Pager [phone]

## 2017-07-25 NOTE — ED Nursing Note (Signed)
Report given to Shawna RN

## 2017-07-25 NOTE — ED Nursing Note (Signed)
Report received from [facility] RN. Pt appears asleep, breaths even and unlabored. RN mont

## 2017-07-25 NOTE — Discharge Instructions (Addendum)
Thank you for choosing [facility] for your emergency health care needs. It has been our privilege to take care of you today.  You have been treated for suicidal thoughts and discharged home. Please take all medicines that are prescribed to you as directed.  It is crucial, if you have a primary care physician, to follow up with him or her in the time frame recommended as many health conditions that seem self-limited initially may actually worsen over time.  If you do not have a primary care physician, we will outline the various resources available for you to find one.     If at any time you feel that your condition is worsening, call your doctor or return to the emergency department for reevaluation.      Please realize that the results of some studies that you had done during your stay with us (such as xrays and cultures) are only preliminarily resulted.  Results of these studies may change as more information becomes available or as the studies are reevaluated by other members of our health care team in the next few days. We will attempt to contact you with any important changes or additions to the studies that were obtained today.       GENERAL PAIN MEDICATION PRECAUTIONS    You may have been prescribed medications for pain today. Some of these may contain a narcotic (Vicodin, Norco, Percocet, etc.) Take these pain medications as prescribed. You also may have been prescribed a muscle relaxant or anxiolytic (benzodiazepine) such as valium (diazepam) or ativan (lorazepam). Do not drink alcohol, drive, or operate heavy machinery while taking either narcotic or benzodiazepine medications. All narcotics and benzodiazepines have a risk of dependence, so please use with caution. If you were prescribed Vicodin, Norco, or Percocet, do not take additional products containing Tylenol (acetaminophen), as this can cause an acetaminophen overdose and can damage your liver. Do not take more than 4000mg of  acetaminophen daily.    You may also have been prescribed an anti-inflammatory pain medication (NSAID) such as ibuprofen (Motrin, Advil) or naproxen (Aleve). Take the NSAID as prescribed, with food or milk.  Stop taking the NSAID if you develop abdominal pain, vomiting blood or dark/tarry or bloody stools. Be sure to drink plenty of fluids, at least 1-2 liters of water per day while taking an NSAID unless you have congestive heart failure or other condition that requires you to limit your water intake.
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
## 2017-07-25 NOTE — ED Nursing Note (Signed)
Per psychiatry pt most likely to be discharged, pt provided with milk and crackers per request, tolerating well, will continue to monitor

## 2017-07-25 NOTE — Allied Health Progress (Addendum)
Spoke with Nicholas Krueger at [facility], who is requesting medical clearance H&P as well as labwork. No beds available at this time. Psychiatry aware.      Addendum: 0730 MD H&P faxed to [facility] per their request; no labwork completed for this admission but for blood glucose, due to patient being SMART cleared.

## 2017-07-25 NOTE — Progress Notes (Addendum)
Brief progress note.     Patient no longer meets 5150 criteria for hold, ok to discharge to self. Offered psychiatric resources, which patient politely declined. Accepted handout for [facility] and is able to verbalize viable plan by which to ensure access to food, clothing, and shelter. Safety planning done. Discussed with patient to go to ED or call 911 if thoughts of harm to self or others. Provided resources for urgent mental health care needs.    Full note to follow.    Case was discussed and plan of care developed with attending psychiatrist, Dr. Garlon Hatchet. Additional recommendations may follow in an addendum.    Report electronically signed by:  Jeronimo Greaves, MD  [facility]  Dept. of Psychiatry, PGY-1  Personal Pager: [phone]  [phone]    Attending Note:    Patient is in good behavioral control on interview. States that he has had time to reflect upon his problems and that they do not seem so depressing anymore.  Able to verbalize plan for self care and shelter. Denies SI/HI.  MSE:  Cooperative with interview Mood: euthymic Affect: restricted TP: Linear TC: themes of wanting to be discharged no SI/HI or AVH  I/J wnl.    Agree with plan to discharge.

## 2017-07-25 NOTE — ED Notes (Signed)
Report given to Beth RN for transfer of care.

## 2017-07-25 NOTE — ED Nursing Note (Signed)
Psychiatry at bedside to speak with patient

## 2017-07-31 LAB — ELECTROCARDIOGRAM WITH RHYTHM STRIP: QTC: 423

## 2017-08-02 LAB — PROTHROMBIN TIME CARE EVERYWHERE
INR CARE EVERYWHERE: 1.1 Ratio
PROTHROMBIN TIME CARE EVERYWHERE: 13.7 s (ref 11.7–14.3)

## 2017-08-09 ENCOUNTER — Emergency Department (EMERGENCY_DEPARTMENT_HOSPITAL): Payer: MEDICAID

## 2017-08-09 ENCOUNTER — Emergency Department
Admission: EM | Admit: 2017-08-09 | Discharge: 2017-08-09 | Disposition: A | Discharge status: Home / Self Care | Payer: MEDICAID | Attending: Emergency Medicine | Admitting: Emergency Medicine

## 2017-08-09 DIAGNOSIS — E785 Hyperlipidemia, unspecified: Secondary | ICD-10-CM | POA: Insufficient documentation

## 2017-08-09 DIAGNOSIS — F17210 Nicotine dependence, cigarettes, uncomplicated: Secondary | ICD-10-CM | POA: Insufficient documentation

## 2017-08-09 DIAGNOSIS — I10 Essential (primary) hypertension: Secondary | ICD-10-CM | POA: Insufficient documentation

## 2017-08-09 DIAGNOSIS — Z79899 Other long term (current) drug therapy: Secondary | ICD-10-CM | POA: Insufficient documentation

## 2017-08-09 DIAGNOSIS — R079 Chest pain, unspecified: Secondary | ICD-10-CM

## 2017-08-09 DIAGNOSIS — Z888 Allergy status to other drugs, medicaments and biological substances status: Secondary | ICD-10-CM | POA: Insufficient documentation

## 2017-08-09 DIAGNOSIS — R51 Headache: Secondary | ICD-10-CM

## 2017-08-09 DIAGNOSIS — J9811 Atelectasis: Secondary | ICD-10-CM

## 2017-08-09 DIAGNOSIS — Z8249 Family history of ischemic heart disease and other diseases of the circulatory system: Secondary | ICD-10-CM | POA: Insufficient documentation

## 2017-08-09 DIAGNOSIS — I252 Old myocardial infarction: Secondary | ICD-10-CM | POA: Insufficient documentation

## 2017-08-09 LAB — PROTHROMBIN TIME CARE EVERYWHERE
INR CARE EVERYWHERE: 1.2
PROTHROMBIN TIME CARE EVERYWHERE: 13.1 s — ABNORMAL HIGH (ref 9.7–12.5)

## 2017-08-09 LAB — CBC WITH DIFF, BLOOD
ANC-Automated: 4.7 10*3/uL (ref 1.6–7.0)
Abs Basophils: 0 10*3/uL (ref <0.1)
Abs Eosinophils: 0.1 10*3/uL (ref 0.1–0.5)
Abs Lymphs: 1 10*3/uL (ref 0.8–3.1)
Abs Monos: 0.8 10*3/uL (ref 0.2–0.8)
Basophils: 0 %
Eosinophils: 1 %
Hct: 30.8 % — ABNORMAL LOW (ref 40.0–50.0)
Hgb: 9.8 gm/dL — ABNORMAL LOW (ref 13.7–17.5)
Lymphocytes: 15 %
MCH: 23.6 pg — ABNORMAL LOW (ref 26.0–32.0)
MCHC: 31.8 g/dL — ABNORMAL LOW (ref 32.0–36.0)
MCV: 74 um3 — ABNORMAL LOW (ref 79.0–95.0)
MPV: 9.4 fL (ref 9.4–12.4)
Monocytes: 12 %
Plt Count: 324 10*3/uL (ref 140–370)
RBC: 4.16 10*6/uL — ABNORMAL LOW (ref 4.60–6.10)
RDW: 17.1 % — ABNORMAL HIGH (ref 12.0–14.0)
Segs: 72 %
WBC: 6.6 10*3/uL (ref 4.0–10.0)

## 2017-08-09 LAB — ECG 12-LEAD
ATRIAL RATE: 84 {beats}/min
ECG INTERPRETATION: NORMAL
P AXIS: 64 degrees
PR INTERVAL: 142 ms
QRS INTERVAL/DURATION: 104 ms
QT: 364 ms
QTC INTERVAL: 430 ms
R AXIS: 59 degrees
T AXIS: 39 degrees
VENTRICULAR RATE: 84 {beats}/min

## 2017-08-09 LAB — COMPREHENSIVE METABOLIC PANEL, BLOOD
ALT (SGPT): 21 U/L (ref 0–41)
AST (SGOT): 24 U/L (ref 0–40)
Albumin: 3.8 g/dL (ref 3.5–5.2)
Alkaline Phos: 85 U/L (ref 40–129)
Anion Gap: 15 mmol/L (ref 7–15)
BUN: 15 mg/dL (ref 6–20)
Bicarbonate: 20 mmol/L — ABNORMAL LOW (ref 22–29)
Bilirubin, Tot: 0.17 mg/dL (ref <1.2)
Calcium: 8.4 mg/dL — ABNORMAL LOW (ref 8.5–10.6)
Chloride: 105 mmol/L (ref 98–107)
Creatinine: 0.74 mg/dL (ref 0.67–1.17)
GFR: >60 mL/min
Glucose: 82 mg/dL (ref 70–99)
Potassium: 4 mmol/L (ref 3.5–5.1)
Sodium: 140 mmol/L (ref 136–145)
Total Protein: 6.4 g/dL (ref 6.0–8.0)

## 2017-08-09 LAB — PROTHROMBIN TIME, BLOOD
INR: 1.2
PT,Patient: 13.1 s — ABNORMAL HIGH (ref 9.7–12.5)

## 2017-08-09 LAB — TSH, BLOOD: TSH: 0.75 u[IU]/mL (ref 0.27–4.20)

## 2017-08-09 LAB — TROPONIN T GEN 5 W/REFLEX TO CK/CKMB
Troponin T Gen 5 w/Reflex CK/CKMB: <6 ng/L (ref <22)
Troponin T Gen 5 w/Reflex CK/CKMB: <6 ng/L (ref <22)

## 2017-08-09 LAB — MAGNESIUM, BLOOD: Magnesium: 2 mg/dL (ref 1.6–2.6)

## 2017-08-09 MED ORDER — LACTATED RINGERS IV SOLN
Freq: Once | INTRAVENOUS | Status: AC
Start: 2017-08-09 — End: 2017-08-09
  Administered 2017-08-09: 19:00:00 via INTRAVENOUS

## 2017-08-09 MED ORDER — ACETAMINOPHEN 325 MG PO TABS
975.0000 mg | ORAL_TABLET | Freq: Once | ORAL | Status: AC
Start: 2017-08-09 — End: 2017-08-09
  Administered 2017-08-09: 975 mg via ORAL
  Filled 2017-08-09: qty 3

## 2017-08-09 MED ORDER — OXYCODONE-ACETAMINOPHEN 5-325 MG OR TABS
1.0000 | ORAL_TABLET | Freq: Once | ORAL | Status: AC
Start: 2017-08-09 — End: 2017-08-09
  Administered 2017-08-09: 1 via ORAL
  Filled 2017-08-09: qty 1

## 2017-08-09 MED ORDER — IOHEXOL 350 MG/ML IV SOLN
100.0000 mL | Freq: Once | INTRAVENOUS | Status: AC
Start: 2017-08-09 — End: 2017-08-09
  Administered 2017-08-09: 100 mL via INTRAVENOUS
  Filled 2017-08-09: qty 100

## 2017-08-09 NOTE — ED Notes (Cosign Needed)
Received pt in bed 25, MD at bedside. Pt complains of chest pain for about an hour, pt reports that he passed out twice and the chest pain began right before the first episode. Pt states it is a crushing pain 9/10 that radiates throughout his left side of body. 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 Seen by MD, labs were drawn, pending results. Pt is on cardiac monitor, resting comfortably in bed and in NAD. Xray at bedside

## 2017-08-09 NOTE — ED Provider Notes (Signed)
Emergency Department Note  [facility] electronic medical record reviewed for pertinent medical history.     Nursing Triage Note:   Chief Complaint   Patient presents with   . Chest Pain - Adult     onset midsternal cp this am while riding trolley.  "squeezing like" radiating to l side and + nausea.  treated c nitrates en route, cp continues to be 6/10       HPI:   47 year old male with a PMH significant for HTN, HLD, and reported MI 6 years ago, denies stents or CABG "they just rota-rootered it out," and who presents with chest pain since this morning.  Also states the pain got so bad that he almost passed out.  Unclear if he lost consciousness.  He felt nauseated, but denies any recent cough, fevers, chills, abdominal pain. Denies any prior history of blood clots.  He is a current every day smoker, but denies any alcohol or drug use.  He denies any lateralizing numbness, weakness, or balance issues.  Denies any vertigo.  He does note a mild headache after being given nitro and nitroglycerin, and notes that his chest pain went from a 10/10 to a 9/10 after treatment with aspirin.    Review of Systems:  Patient denies: VC, SOB, abdominal pain, nausea, vomiting, fevers, chills, constipation, diarrhea, melena, hematochezia, dysuria, hematuria, weakness, numbness, night sweats, weight loss.   +CP, HA, syncope.         HPI    Past Medical History:   Diagnosis Date   . ACS (acute coronary syndrome) (CMS-HCC)    . Heart murmur    . Hypertension    . Myocardial infarction        Past Surgical History:   Procedure Laterality Date   . back surgery     . knee surgery     . surgery to the skull  from fracture       Family History   Problem Relation Name Age of Onset   . Heart Attack Father         Social History     Tobacco Use   . Smoking status: Current Every Day Smoker     Packs/day: 0.25     Years: 25.00     Pack years: 6.25   . Smokeless tobacco: Never Used   Substance Use Topics   . Alcohol use: No   . Drug use: Not on file        Medications:   Prior to Admission Medications   Prescriptions Last Dose Informant Patient Reported? Taking?   aspirin 81 MG chewable tablet   No No   Sig: Take 1 tablet by mouth daily.   atorvastatin (LIPITOR) 20 MG tablet   No No   Sig: Take 1 tablet by mouth every evening.   lisinopril (PRINIVIL, ZESTRIL) 10 MG tablet   Yes No   Sig: Take 10 mg by mouth nightly.      Facility-Administered Medications: None       Allergies: Toradol and Tramadol    Review of Systems  All other systems reviewed and negative unless otherwise noted in the HPI or above. This was done per my custom and practice for systems appropriate to the chief complaint in an emergency department setting and varies depending on the quality of history that the patient is able to provide.      Physical Exam:   08/09/17  1715 08/09/17  1739   BP: 118/60  Pulse: 94    Resp: 20    Temp: 98.1 F (36.7 C)    SpO2: 99% (P) 97%     Nursing note and vitals reviewed.     Physical Exam   Constitutional: He appears well-developed and well-nourished.   HENT:   Head: Normocephalic.   Eyes: EOM are normal.   Neck: Normal range of motion.   Cardiovascular: Normal rate, regular rhythm and normal heart sounds. Exam reveals no gallop and no friction rub.   No murmur heard.  Radial pulses intact, and symmetric.   Pulmonary/Chest: Effort normal and breath sounds normal. No respiratory distress. He has no wheezes. He has no rales. He exhibits no tenderness.   Abdominal: Soft. Bowel sounds are normal. He exhibits no distension and no mass. There is no tenderness. There is no rebound and no guarding. No hernia.   Musculoskeletal: Normal range of motion.   Neurological:   Pt mentating appropriately, speech is clear. Cranial nerves 3-12 intact. No visual field deficits or diplopia. No facial dyssymmetry. Upper extremities held at 90 degrees without drift. Lower extremities held at 30 degrees without difficulty. Sensation is described normal over all extremities. Gait  is at baseline. No spinning sensation.       Skin: Skin is warm and dry.   Nursing note and vitals reviewed.      Workup Review:         Impression & Initial ED Plan:  47 year old male with a PMH significant for HTN, HLD, and reported MI 6 years ago, denies stents or CABG "they just rota-rootered it out," and who presents with chest pain since earlier this afternoon, unclear episode of pre-syncope vs syncope otherwise well appearing, but continues to endorse severe chest pain. Concern for ACS, MSK pain, dissection, less likely PE, as patient is perc negative, no history of blood clots.  He reports a history of allergy to Toradol and tramadol, and requests IV pain medications as he states "Percocet just does nothing for me."    Workup:  CBC, CMP, lipase, cardiac markers, coags, magnesium  EKG  Chest x-ray  CT head  CT chest  Pain control  Aspirin and nitro given in route  Monitor and reassess        Update:  Labs demonstrate no significant leukocytosis, with an anemia of 9.8, consistent with patient's most recent baseline of 10.5.  He denies any melena or hematochezia.  Additionally, no significant electrolyte abnormalities or renal dysfunction.  His TSH and magnesium were within normal limits.    EKG non-ischemic, no ectopy    CT head without any acute intracranial abnormality    CT chest without demonstration of aortic abnormality, otherwise unremarkable.    Serial cardiac markers trended, which are unremarkable.    The patient's chest pain appears atypical, and he notes improvement after oral Percocet.  I discussed that he should follow up with Cardiology, and he is referred to the clinic for further workup and assessment.  He notes he will call for an appointment in trying get in on Tuesday as Monday is a holiday.  He is otherwise well appearing, and plan we for discharge to follow up as discussed, and he is given very strict return precautions.         Ritta Slot, MD  08/10/17 1246

## 2017-08-09 NOTE — ED Notes (Signed)
Pt is currently pain free.  Discharged home with instructions and a bus pass. Pt verbalized understanding and ambulated with a steady gait from the tx area.

## 2017-08-09 NOTE — ED Notes (Signed)
Bed: 28  Expected date:   Expected time:   Means of arrival:   Comments:  36 F Weakness

## 2017-08-09 NOTE — ED Notes (Signed)
Bed: 25  Expected date:   Expected time:   Means of arrival:   Comments:  15 M cp

## 2017-08-09 NOTE — ED Notes (Cosign Needed)
Pt unable to tolerate standing position for orthostatic vital signs. Pt states he is too dizzy to stand safely

## 2017-08-09 NOTE — ED Notes (Signed)
08/09/2017 6:42 PM Javone Ybanez    An EKG was handed to Dr. Christella Noa.

## 2017-08-09 NOTE — EMS Narrative (Addendum)
Pt Age: 47 Years; Gender: Male;  Primary Impression: Chest Pain (cardiac);  Cp    Medical History: Hypertension (HTN), Behavior: Major Depressive   Disorder/Major Depression; Pt Medication: ; Pt Allergies: Tramadol,   Ketorolac, ;  Pt states he was on trolley when he developed chest pain.     Date/Time: 08/09/2017 16:48:13; Chest: Pain; Mental Status: Normal Baseline   for Patient; Neuro: Normal Baseline for Patient; Eye Lt: PERRL; Eye Rt:   PERRL;     Crew: First Responder [employer]; Date/Time: 08/09/2017 16:29:34; Prior Care:   Yes; Medication Given: NTG; Route: Topical; Dosage: 1; Units: Inches (in);   Role/Type of Person Administering Medication: EMT-Paramedic; Authorization:   SO - Standing Order;   Crew: First Resp[employer]; Date/Time: 08/09/2017 16:29:17; Prior Care:   Yes; Medication Given: Aspirin; Route: Oral; Dosage: 324; Units: Milligrams   (mg); Role/Type of Person Administering Medication: EMT-Paramedic;   Authorization: SO - Standing Order;   Crew: First Responder [employer]; Date/Time: 08/09/2017 16:29:53; Prior Care:   No; Medication Given: NTG; Route: Sublingual; Dosage: 0.4; Units:   Milligrams (mg); Role/Type of Person Administering Medication:   EMT-Paramedic; Authorization: SO - Standing Order;     Base Called: [facility]    CC:  Date/Time of Symptom Onset: 2019-05-26T16:15:27-07:00    HPI:  Provider's Primary Impression: Angina pectoris, unspecified  Initial Patient Acuity: Lower Acuity [place] Si)    Alert:  Patient Care Report Number: [id number]  Incident Number: [id number]  EMS Vehicle (Unit) Number: [id number]  EMS Unit Call Sign: [id number]  Level of Care of This Unit: ALS-Paramedic  Incident Street Address: [address]  [name]: [id number]  Incident ZIP Code: [id number]    Assessment:  Heart Assessment: Normal  Mental Status Assessment: Normal Baseline for Patient    Procedure - Arrest:  Cardiac Arrest: No    Procedure - Exam:    Procedure - Injury:    Procedure - Airway:    Procedure -  Medications:  Date/Time Medication Administered: 2019-05-26T16:29:17-07:00  Medication Administered Prior to this Unit's EMS Care: Yes  Medication Given: 1191  Medication Administered Route: Oral  Medication Dosage: 324  Medication Dosage Units: Milligrams (mg)  Date/Time Medication Administered: 2019-05-26T16:29:34-07:00  Medication Administered Prior to this Unit's EMS Care: Yes  Medication Given: 4917  Medication Administered Route: Topical  Medication Dosage: 1  Medication Dosage Units: Inches (in)  Date/Time Medication Administered: 2019-05-26T16:29:53-07:00  Medication Administered Prior to this Unit's EMS Care: No  Medication Given: 4917  Medication Administered Route: Sublingual  Medication Dosage: 0.4  Medication Dosage Units: Milligrams (mg)    Procedure - Generic:  Date/Time Procedure Performed: 2019-05-26T16:27:59-07:00  Procedure Performed Prior to this Unit's EMS Care: Yes  Procedure: [id number]  Date/Time Procedure Performed: 2019-05-26T16:27:59-07:00  Procedure Performed Prior to this Unit's EMS Care: Yes  Procedure: [id number]  Date/Time Procedure Performed: 2019-05-26T16:31:01-07:00  Procedure Performed Prior to this Unit's EMS Care: Yes  Procedure: [id number]  Vascular Access Location: Hand-Left  Date/Time Procedure Performed: 2019-05-26T16:27:59-07:00  Procedure Performed Prior to this Unit's EMS Care: No  Procedure: [id number]  Date/Time Procedure Performed: 2019-05-26T16:27:59-07:00  Procedure Performed Prior to this Unit's EMS Care: No  Procedure: [id number]  Date/Time Procedure Performed: 2019-05-26T16:42:59-07:00  Procedure Performed Prior to this Unit's EMS Care: No  Procedure: [id number]    Demographics History:  Medication Allergies: 10689  Medication Allergies: 35827    Demographics Practitioner:    Demographics Patient:  Last Name: Almanzar  First Name: [name]  Middle Initial/Name: Ethelene Browns  [facility]: [id number]  Gender: Male  Race: White  Age: 44  Age Units: Years    Demographics  Times:  Unit Notified by Dispatch Date/Time: 2019-05-26T16:21:30-07:00  Unit En Route Date/Time: 2019-05-26T16:21:52-07:00  Unit Arrived on Scene Date/Time: 2019-05-26T16:28:38-07:00  Arrived at Patient Date/Time: 2019-05-26T16:26:18-07:00  Unit Left Scene Date/Time: 2019-05-26T16:43:59-07:00  Patient Arrived at Destination Date/Time: 2019-05-26T16:58:37-07:00  Destination Patient Transfer of Care Date/Time: 2019-05-26T16:58:00-07:00    Demographics Payment:

## 2017-08-10 NOTE — ED EKG Interpretation (Signed)
ED EKG Interpretation    EKG: Normal Sinus Rhythm with Normal Axis and no ischemic changes.

## 2017-08-11 LAB — BMP (EXT)
Anion Gap (EXT): 13 mmol/L — ABNORMAL HIGH (ref 3–11)
BUN (EXT): 10 mg/dL (ref 5–22)
CO2 (EXT): 21 mmol/L (ref 20–31)
CalciumCalcium (EXT): 8.4 mg/dL (ref 8.4–10.2)
Chloride (EXT): 102 mmol/L (ref 98–108)
Creatinine (EXT): 0.6 mg/dL (ref 0.5–1.3)
Glucose (EXT): 100 mg/dL (ref 70–109)
Potassium (EXT): 3.5 mmol/L (ref 3.4–5.0)
Sodium (EXT): 136 mmol/L (ref 136–145)
eGFR - Creat MDRD (EXT): >60 mL/min (ref >=60)
eGFR - Creat MDRD (EXT): >60 mL/min (ref >=60)

## 2017-08-14 ENCOUNTER — Telehealth (HOSPITAL_COMMUNITY): Payer: Self-pay

## 2017-08-14 NOTE — Telephone Encounter (Signed)
WQ      Attempted to contact patient to schedule referral.  Phone number on file just rings and no voicemail set up.  Deferred    If patient calls back, schedule new cardiology appt

## 2017-08-21 NOTE — Telephone Encounter (Signed)
WQ        Second attempt to contact patient to schedule referral.  Phone number on file just rings and no voicemail set up.  Deferred     If patient calls back, schedule new cardiology appt

## 2017-08-25 ENCOUNTER — Ambulatory Visit: Payer: MEDICAID

## 2017-08-25 DIAGNOSIS — R0789 Other chest pain: Secondary | ICD-10-CM
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
## 2017-08-26 ENCOUNTER — Inpatient Hospital Stay
Admit: 2017-08-26 | Discharge: 2017-08-26 | Disposition: A | Discharge status: Home / Self Care | Payer: MEDICAID | Source: Home / Self Care

## 2017-08-26 LAB — B-Type Natriuretic Peptide: BNP: 28 pg/mL (ref <100)

## 2017-08-26 LAB — CK, Total: CREATINE PHOSPHOKINASE, TOTAL: 214 U/L (ref 63–473)

## 2017-08-26 LAB — Basic Metabolic Panel
ANION GAP: 11 mg/dL (ref 8–19)
GFR ESTIMATE FOR AFRICAN AMERICAN: >89 mg/dL (ref 7–22)

## 2017-08-26 LAB — Extra Red Top (Plastic)

## 2017-08-26 LAB — CBC: ABSOLUTE NUCLEATED RBC COUNT: 0 10*3/uL (ref 0.00–0.00)

## 2017-08-26 LAB — Troponin I: TROPONIN INTERPRETATION: NEGATIVE ng/mL (ref <0.1)

## 2017-08-26 LAB — Prothrombin Time Panel: PROTHROMBIN TIME: 14.1 s (ref 11.5–14.4)

## 2017-08-26 LAB — Differential Automated: EOSINOPHIL PERCENT, AUTO: 2.1 (ref 0.00–0.04)

## 2017-08-26 LAB — APTT: APTT: 30.8 s (ref 24.4–36.2)

## 2017-08-26 NOTE — ED Provider Notes
[facility]  Emergency Department Service Report    [name] [name] [name] Score 47 y.o. male , presents with Chest Pain      Triage   Arrived on 08/25/2017 at 4:53 PM   Arrived by Other [9] (Engine SM 6)    ED Triage Vitals   Temp Temp Source BP Heart Rate Resp SpO2 O2 Device Pain Score Weight   08/25/17 1700 08/25/17 1700 08/25/17 1700 08/25/17 1700 08/25/17 1700 08/25/17 1700 08/25/17 1700 08/25/17 1700 08/25/17 1701   37.2 ???C (98.9 ???F) Oral 129/70 94 16 93 % None (Room air) Six 81.6 kg (180 lb)       Pre hospital care:  Prehospital IV Access  Pre-Arrival IV Access: Yes  Interventions  Medications: ASA, Nitroglycerin, Other (See Comments)    Allergies   Allergen Reactions   ??? Ketorolac Tromethamine    ??? Tramadol        History   [name] [name] is a 47 y.o. male with hx of HTN who presents to [name] ED via EMS with complaint of gradual onset of chest pain that started earlier today. Sx are constant, squeezing, radiating to L jaw, with no exacerbating or alleviating factors. Pt reports he collapsed on [name] floor after experiencing [name] pain. Pt reports having similar sx in [name] past.         [name] history is provided by [name] [name] [name]. No language interpreter was used.   Chest Pain   Episode onset: earlier today. Chest pain occurs constantly. [name] chest pain is unchanged. [name] severity of [name] chest pain is moderate. [name] quality of [name] pain is described as squeezing. [name] pain radiates to [name] left jaw.   His past medical history is significant for hypertension.              Past Medical History:   Diagnosis Date   ??? Heart attack (HCC/RAF)    ??? Hypertension    ??? TIA (transient ischemic attack)         Past Surgical History:   Procedure Laterality Date   ??? BACK SURGERY     ??? CRANIOPLASTY FOR CRANIAL DEFECT     ??? KNEE JOINT MANIPULATION          Past Family History   Family history reviewed by me and there is no pertinent past family history related to [name] [name]'s current case and/or care.     Past Social History he reports that he has been smoking Cigarettes.  He has been smoking about 0.06 packs per day. He has never used smokeless tobacco. He reports that he uses drugs, including Marijuana. He reports that he does not currently engage in sexual activity. He reports that he does not drink alcohol.     Review of Systems   Cardiovascular: Positive for chest pain.   All other systems reviewed and are negative.      Physical Exam   Physical Exam   Constitutional: He appears well-developed and well-nourished. No distress.   HENT:   Head: Normocephalic and atraumatic.   Eyes: Conjunctivae and EOM are normal.   Neck: Normal range of motion. Neck supple.   Cardiovascular: Normal rate and regular rhythm.    Pulmonary/Chest: Effort normal and breath sounds normal. No respiratory distress.   Abdominal: Soft. There is no tenderness. There is no rebound and no guarding.   Musculoskeletal: Normal range of motion.   Lymphadenopathy:     He has no cervical adenopathy.   Neurological: He is alert. No sensory deficit.  Moving all four extremities.   Skin: Skin is warm and dry.   Psychiatric: His behavior is normal.   Nursing note and vitals reviewed.      ED Course          Laboratory Results     Labs Reviewed   BASIC METABOLIC PANEL - Abnormal; Notable for [name] following:        Result Value    Glucose 101 (*)     All other components within normal limits   CBC (PERFORMABLE) - Abnormal; Notable for [name] following:     Red Blood Cell Count 4.36 (*)     Hemoglobin 10.0 (*)     Hematocrit 31.9 (*)     Mean Corpuscular Volume 73.2 (*)     Mean Corpuscular Hemoglobin 22.9 (*)     MCH Concentration 31.3 (*)     Red Cell Distribution Width-CV 17.1 (*)     Platelet Count, Auto 417 (*)     Mean Platelet Volume 9.0 (*)     All other components within normal limits   CK, TOTAL - Normal   PROTHROMBIN TIME PANEL - Normal   APTT - Normal   CBC & AUTO DIFFERENTIAL    Narrative:     [name] following orders were created for panel order CBC & Plt & Diff (Chem C).  Procedure                               Abnormality         Status                     ---------                               -----------         ------                     YNW[[id number]]                          Abnormal            Final result               Differential, Automated[[id number]]                          Final result                 Please view results for these tests on [name] individual orders.   TROPONIN I   DIFFERENTIAL, AUTOMATED (PERFORMABLE)   BNP   RAINBOW DRAW TO LABORATORY    Narrative:     [name] following orders were created for panel order Rainbow Draw to Laboratory (Red) (Chem C).  Procedure                               Abnormality         Status                     ---------                               -----------         ------  Extra Red Top (Plastic)[[id number]]                          In process                   Please view results for these tests on [name] individual orders.   EXTRA RED TOP (PLASTIC)       Imaging Results     XR chest ap portable (1 view)   Final Result by Elmon Kirschner., MD, PhD (06/11 1743)   IMPRESSION:   Mild aortic calcification without tortuosity or cardiomegaly.   Lungs and pleural spaces clear.   No acute osseous abnormalities.    No acute cardiopulmonary disease .      Signed by: Roetta Sessions   08/25/2017 5:43 PM          Administered Medications     Medication Administration from 08/25/2017 1653 to 08/25/2017 1827     None          Procedures   ED ECG interpretation  Date/Time: 08/25/2017 4:57 PM  Performed by: Dewayne Shorter.  Authorized by: Dewayne Shorter     ECG reviewed by ED Physician in [name] absence of a cardiologist: yes    Previous ECG:     Previous ECG:  Unavailable  Interpretation:     Interpretation: normal    Rate:     ECG rate:  87    ECG rate assessment: normal    Rhythm:     Rhythm: sinus rhythm    ST segments:     ST segments:  Normal      Rhythm Strip Interpretation  08/25/2017  6:27 PM Normal sinus rhythm rate of 90 bpm.    MDM     ED Course:  Nursing note reviewed.  Previous medical records were obtained and reviewed by myself. They reveal a history of HTN,TIA, heart attack.  EMS run sheet obtained and reviewed by myself.     5:26 PM I visited [name] [name] [name] to obtain history and perform [name] physical exam. IV access was secured.  Orders placed for labs and imaging.    Laboratory Results (as interpreted by me):  BMP shows glucose 101. CBC shows Hgb 10, Hct 31.9 which is similar to prior CBCs.     Radiology Results (as interpreted by me):   XR chest ap (1 view) shows mild aortic calcification without tortuosity or cardiomegaly. Lungs and pleural spaces are clear, and no acute osseous abnormalities or cardiopulmonary disease.       Medical Decision Making:  [name] [name] [name] presents to [name] ED today with chief complaint of chest pain.  On exam, [name] [name] [name] exhibits no abnormal findings.      Given [name] [name] [name]'s physical exam, laboratory analysis, and imaging studies, my impression is [name] [name] [name] is experiencing an episode of atypical chest pain.   Therefore, I feel comfortable discharging [name] [name] [name] home with close outpatient follow up.    Discharge Instructions:  I advised [name] [name] [name] to follow up with their primary care physician on an outpatient basis over [name] next 24-48 hours, and recommended prompt  return to [name] emergency department if they have additional concerns or note worsening symptoms.  [name] [name] [name] indicates understanding of these issues and agrees with [name] plan.  A copy of [name] ED  workup results was provided to [name] [name] [name] as well.      Data Reviewed/Counseling: I have reviewed [name] [name] [name]'s vital signs,  nursing notes and old medical records. I had a detailed discussion with [name] [name] [name] regarding [name] historical points, exam findings, and any diagnostic results supporting [name] discharge diagnosis. I also discussed lab results, radiology results, [name] need for outpatient follow-up and [name] need to return to [name] ED if symptoms worsen or if there are any questions or concerns that arise at home.          Clinical Impression     1. Atypical chest pain        Prescriptions     New Prescriptions    No medications on file       Disposition and Follow-up   Disposition:     No future appointments.    Follow up with:  Clinic, Lavina Hamman, MD  [address]  [place] [address]  [phone]    In 2 days        Return precautions are specified on After Visit Summary.    [name] documentation on this chart was performed by Ladon Applebaum, scribed for Dewayne Shorter., MD    08/25/2017 5:41 PM     ***

## 2017-08-26 NOTE — ED Notes
Pt given d/c instructions and prescription education. VFI,EPP,IRJJ8. Ambulatory with steady gait. Pt verbalizes understanding and is encouraged to return to ED if symptoms persist or worsen.   Pt given bus pass

## 2017-09-06 LAB — CREATININE CARE EVERYWHERE
CREATININE CARE EVERYWHERE: 0.74 mg/dL (ref <=1.30)
ESTIMATED GFR CARE EVERYWHERE: 111 mL/min/BSA

## 2017-10-05 LAB — CREATININE CARE EVERYWHERE
CREATININE CARE EVERYWHERE: 0.82 mg/dL (ref <=1.30)
ESTIMATED GFR CARE EVERYWHERE: 107 mL/min/BSA

## 2017-10-23 LAB — CREATININE CARE EVERYWHERE
CREATININE CARE EVERYWHERE: 0.72 mg/dL (ref <=1.30)
ESTIMATED GFR CARE EVERYWHERE: 113 mL/min/BSA

## 2017-10-23 LAB — INR CARE EVERYWHERE: INR CARE EVERYWHERE: 1.1 (ref 0.8–1.2)

## 2017-11-02 DIAGNOSIS — I69354 Hemiplegia and hemiparesis following cerebral infarction affecting left non-dominant side: Secondary | ICD-10-CM

## 2017-11-02 DIAGNOSIS — F3289 Other specified depressive episodes: Secondary | ICD-10-CM

## 2017-11-02 DIAGNOSIS — I10 Essential (primary) hypertension: Secondary | ICD-10-CM

## 2017-11-02 DIAGNOSIS — F4312 Post-traumatic stress disorder, chronic: Secondary | ICD-10-CM

## 2017-11-02 DIAGNOSIS — I2510 Atherosclerotic heart disease of native coronary artery without angina pectoris: Secondary | ICD-10-CM

## 2017-11-02 DIAGNOSIS — Z716 Tobacco abuse counseling: Secondary | ICD-10-CM

## 2017-11-02 DIAGNOSIS — I252 Old myocardial infarction: Secondary | ICD-10-CM

## 2017-11-02 DIAGNOSIS — Z720 Tobacco use: Secondary | ICD-10-CM

## 2017-11-06 LAB — CREATININE CARE EVERYWHERE
CREATININE CARE EVERYWHERE: 0.78 mg/dL (ref <=1.30)
ESTIMATED GFR CARE EVERYWHERE: 109 mL/min/BSA

## 2017-11-12 ENCOUNTER — Ambulatory Visit

## 2017-11-12 ENCOUNTER — Inpatient Hospital Stay
Admit: 2017-11-12 | Discharge: 2017-11-13 | Disposition: A | Discharge status: Home / Self Care | Source: Home / Self Care

## 2017-11-12 DIAGNOSIS — R079 Chest pain, unspecified: Secondary | ICD-10-CM

## 2017-11-12 LAB — B-Type Natriuretic Peptide: BNP: 41 pg/mL (ref <100)

## 2017-11-12 LAB — Troponin I
TROPONIN INTERPRETATION: NEGATIVE ng/mL (ref <0.1)
TROPONIN INTERPRETATION: NEGATIVE ng/mL (ref <0.1)

## 2017-11-12 LAB — Differential Automated: BASOPHIL PERCENT, AUTO: 0.6 (ref 0.00–0.04)

## 2017-11-12 LAB — Prothrombin Time Panel: PROTHROMBIN TIME: 14.5 s — ABNORMAL HIGH (ref 11.5–14.4)

## 2017-11-12 LAB — Basic Metabolic Panel
GLUCOSE: 115 mg/dL — ABNORMAL HIGH (ref 65–99)
UREA NITROGEN: 12 mg/dL (ref 7–22)

## 2017-11-12 LAB — CBC: HEMATOCRIT: 33.9 — ABNORMAL LOW (ref 38.5–52.0)

## 2017-11-12 LAB — APTT: APTT: 31.6 s (ref 24.4–36.2)

## 2017-11-12 LAB — CK, Total: CREATINE PHOSPHOKINASE, TOTAL: 212 U/L (ref 63–473)

## 2017-11-12 LAB — GENERIC EXTERNAL RESULT (UNMAPPED): Hgb A1c - HPLC: 5.7 % — ABNORMAL HIGH (ref <5.7)

## 2017-11-12 MED ADMIN — ASPIRIN 81 MG PO CHEW: 81 mg | ORAL | Stop: 2017-11-13 | NDC 66553000201

## 2017-11-12 MED ADMIN — MORPHINE SULFATE (PF) 4 MG/ML IV SOLN: 4 mg | INTRAVENOUS | @ 19:00:00 | Stop: 2017-11-12 | NDC 00641612525

## 2017-11-12 MED ADMIN — LISINOPRIL 10 MG PO TABS: 10 mg | ORAL | Stop: 2017-11-13 | NDC 60687032501

## 2017-11-12 MED ADMIN — ONDANSETRON HCL 4 MG/2ML IJ SOLN: 4 mg | INTRAVENOUS | @ 21:00:00 | Stop: 2017-11-12

## 2017-11-12 MED ADMIN — SODIUM CHLORIDE 0.9 % IV SOLN: 75 mL/h | INTRAVENOUS | Stop: 2017-11-13 | NDC 00338004904

## 2017-11-12 MED ADMIN — MORPHINE SULFATE (PF) 4 MG/ML IV SOLN: 4 mg | INTRAVENOUS | @ 20:00:00 | Stop: 2017-11-12 | NDC 00641612525
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
## 2017-11-12 NOTE — ED Notes
COLLECTIVE?NOTIFICATION?11/12/2017 11:40?[name] [name]?MRN: [id number]    [facility] [name] [name]'s patient encounter information:   ZDG:[id number]  Account [id number]  Billing Account [id number]      Criteria Met      6 Visits in 180 Days    2 Visits in 30 Days    Care Recommendation    Security and Safety  No recent Security Events currently on file    ED Care Guidelines from [facility]  Last Updated: 07/28/17 6:46 PM      Care Recommendation:    WARNING/SAFETY ALERT:? *****STROKE SYMPTOMS ? USE CAUTION WHEN CONSIDERING tPA. [facility] WARNING ''PATIENT HAS MORE THAN 20 ADMISSIONS FOR LEFT SIDED WEAKNESS, RECEIVED tPA MORE THAN 10 TIMES.''? 01/21/[facility]    Personal history of TIA and cerebral infarction without residual deficits ? MRI 05/31/2017 ''stable MRI scan of the brain without evidence of acute intracranial abnormality.''? CTA 05/30/2017 ''stable intracranial contents. Negative for acute intracranial abnormality''.      EXTENSIVE ED VISIT HISTORY:? [address] different [address], 6 [place], 6 [place], 7 [place], 1 [place] for multiple complaints (see below).    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    PATIENT IDENTIFICATION:? [place] Identification Card    ASSOCIATED CONCERNS/ASSESSMENT:    Radiation Exposure Risk:? Risk unknown as we are unable to identify how many CT's this patient has had across [place], [place], [place] and [place].      ?Addition CTA Results:? 07/05/2015 ? '' Normal CT angiogram without evidence for acute occlusion, stenosis, dissection, or aneurysm.''    03/01/2015 ? '' No acute intracranial abnormality on the unenhanced portion of the study.'' Recommendation:? Review previous testing: frequent ER visits can result in fragmented primary and specialty care.    KEY MEDICAL CONTACTS: No PCP      Insurance: Multiple Insurances listed: [employer] [place], [place] Medicaid, Medi-Cal, [address], Self Pay, and numerous other insurance providers.  These are guidelines and the [name] [name] should exercise clinical judgment when providing care.    Care History  Substance Use/Overdose  12/18/15 12:00 AM  [facility]  Marijuana only per patient.  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  drug seeking behaviors + drug screen for THC and opiates.   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 Visit Count (12 mo.)  Facility Visits   [facility] 1   [facility] 3   [facility] 1   [facility] [place] 2   [place] [place] 1   [facility] 1   [facility] 1   [facility] 1   [facility] 1   [facility] 1   [facility] 1   [facility] 2   [facility] 1   [facility] 1   [facility] 1   [facility] 1   [facility] 1   [place] 1   [facility] [place] 1   [facility] 1   [facility] 1   [facility] 1 [facility] 1   [facility] [place] 2   [facility] 1   [facility] 1   [facility] 1   [facility] 1   [facility] 2   [facility] 1   [place] [facility] [place] [facility] [place] 2   [facility] 2   [facility] 1   [facility] [place] 1   [facility] 1   [place] [place] 5   [facility] 1   [facility] 1   [address] [facility] 5   [facility] 1   [facility] 1   [facility] 1   [facility] 1   Total 59   Note: Visits indicate total known visits.      Recent Emergency Department Visit Summary  Showing 10 most recent visits out of 59 in the past 12 months  Date Facility [facility] Type Diagnoses or Chief Complaint   Nov 12, 2017 [facility] [place] [facility]     Nov 01, 2017 [facility] [place] [place] [facility]      Essential (primary) hypertension      Auditory hallucinations      Major depressive disorder, single episode, unspecified      Nicotine dependence, cigarettes, uncomplicated      Oct 18, 2017 [facility] [place] [facility]      0. CP SOB      Oct 01, 2017 [facility] [place] [facility]  Chief Complaint: CP RA#[id number]    Sep 20, 2017 [place]. [place]. [place] Emergency      Suicidal Thoughts      Hallucinations      Cannabis dependence, uncomplicated      Auditory hallucinations      Schizoaffective disorder, bipolar type      Suicidal ideations      Bipolar disorder, unspecified      Schizoaffective disorder, unspecified      Sep 12, 2017 [facility] [place] [place] [facility]      -1. Chest pain, unspecified      1. Other chest pain      4. Personal history of transient ischemic attack (TIA), and cerebral infarction without residual deficits 5. Old myocardial infarction      6. UNKNOWN ISSUE      7. Procedure and treatment not carried out due to patient leaving prior to being seen by health care Brinsley Wence      Sep 05, 2017 [place]. [place]. [place] Emergency      Sore Throat      Facial Swelling      Periapical abscess without sinus      Aug 25, 2017 [facility] [place]  1. Other chest pain      2. Chest Pain      Aug 14, 2017 [place]. [place]. [place] Emergency      Suicidal Thoughts      Suicidal ideations      Auditory hallucinations      Jul 29, 2017 [facility] [place] [place] Emergency      Chest pain      Syncope and collapse          Recent Inpatient Visit Summary  Date Facility [facility] Type Diagnoses or Chief Complaint   Oct 23, 2017 [facility] [place] [place] In[facility]      Atherosclerosis of aorta      Personal history of transient ischemic attack (TIA), and cerebral infarction without residual deficits      Anemia, unspecified      Post-traumatic stress disorder, unspecified      Acute ischemic heart disease, unspecified      Essential (primary) hypertension      Old myocardial infarction      Oct 18, 2017 [facility] [place] Observation      0. CHEST PAIN MILD HYPOTENSION      0. CP SOB      1. Chest pain, unspecified      2. Bipolar disorder, unspecified      2. Other stimulant dependence, uncomplicated      2. 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 Syncope and collapse      2. Cannabis abuse, uncomplicated      2. Personal history of transient ischemic attack (TIA), and cerebral infarction without residual deficits      2. Nicotine dependence, cigarettes, uncomplicated      Oct 01, 2017 [facility] [place] [place] [facility]      Other chest pain      Essential (primary) hypertension      Bipolar disorder, unspecified      Long term (current) use of aspirin      Family history of ischemic heart disease and other diseases of the circulatory system Personal history of transient ischemic attack (TIA), and cerebral infarction without residual deficits      Old myocardial infarction      Nicotine dependence, cigarettes, uncomplicated      Post-traumatic stress disorder, unspecified      Hyperlipidemia, unspecified      Jun 07, 2017 [place] [place]. [place] Telemetry  Chief Complaint: CP    May 30, 2017 [facility] [place] [facility] Pul[facility]      0. POSSIBLE BRAIN ATTACK TPA      Apr 14, 2017 [facility]      1. Hemiplegia, unspecified affecting left nondominant side      Apr 05, 2017 [facility] [place] [facility] [place] [facility]      Atherosclerotic heart disease of native coronary artery without angina pectoris      Atherosclerotic heart disease of native coronary artery with unstable angina pectoris      Personal history of transient ischemic attack (TIA), and cerebral infarction without residual deficits      Old myocardial infarction      Other long term (current) drug therapy      Essential (primary) hypertension      Chronic obstructive pulmonary disease, unspecified      4. Nicotine dependence, cigarettes, uncomplicated  6. Unspecified atrial fibrillation      7. Syncope and collapse      Mar 11, 2017 [facility] [place] [place] Tele[facility]      CP R/O ACS      Acute ischemic heart disease, unspecified      Jan 11, 2017 [place]. [place] Telemetry      Syncope and collapse      Chest pain, unspecified      BIBA 3 Chest Pain x 20 min      Hyperlipidemia, unspecified      Personal history of transient ischemic attack (TIA), and cerebral infarction without residual deficits      Tobacco use      Atherosclerotic heart disease of native coronary artery with unstable angina pectoris      Delusional disorders      Anemia, unspecified      Essential (primary) hypertension      Dec 21, 2016 [facility] [place] [facility]      Anemia, unspecified Essential (primary) hypertension      Atherosclerotic heart disease of native coronary artery with unstable angina pectoris      Thyrotoxicosis, unspecified without thyrotoxic crisis or storm      Old myocardial infarction      Nicotine dependence, cigarettes, uncomplicated      Personal history of transient ischemic attack (TIA), and cerebral infarction without residual deficits      Bipolar disorder, unspecified      Chest pain, unspecified      Other long term (current) drug therapy          Care Team  [facility] Type Phone Fax Service Dates   BOCK,CINDY L Case Manager/Care Coordinator   Current    DORFMAN, JUSTIN , D.O. Internal Medicine   Current    Jaynie Crumble, LMSW, LCSW Counselor: Mental Health [phone] Current    Jetty Peeks Social Worker: Clinical [phone] Current    [place] [facility] Primary Care   Current    BOCK,CINDY L Case or Care Manager   Current    Jaynie Crumble Primary Care   Current    Ella Jubilee Primary Care [phone] Current    [facility] Primary Care [phone]  Current    Dimitri Ped Primary Care [phone] Feb 26, 2016 - Current    Colvin Caroli Primary Care   Current    [facility] Primary Care   Current    PCP 0O Primary Care   Current    [facility] Primary Care   Current    [facility] EPIC ADMINISTRATION Primary Care   Current    Yisroel Ramming [facility] Primary Care  [phone] Current      Collective Portal  This patient has registered at the [facility] Emergency Department   For more information visit: [email or web address]   PLEASE NOTE:    1.   Any care recommendations and other clinical information are provided as guidelines or for historical purposes only, and providers should exercise their own clinical judgment when providing care. 2.   You may only use this information for purposes of treatment, payment or health care operations activities, and subject to the limitations of applicable [employer] Policies.    3.   You should consult directly with the organization that provided a care guideline or other clinical history  with any questions about additional information or accuracy or completeness of information provided.    ? 2019 [employer] - [email or web address]

## 2017-11-12 NOTE — ED Notes
Admitting at bsd

## 2017-11-12 NOTE — ED Provider Notes
[facility]  Emergency Department Service Report    [name] [name] 47 y.o. male , presents with Chest Pain      Triage   Arrived on 11/12/2017 at 11:40 AM   Arrived by SM 3 [[id number]]    ED Triage Vitals   Temp Temp Source BP Heart Rate Resp SpO2 O2 Device Pain Score Weight   11/12/17 1146 11/12/17 1146 11/12/17 1146 11/12/17 1146 11/12/17 1146 11/12/17 1146 -- 11/12/17 1148 --   37.1 ???C (98.8 ???F) Oral 99/53 94 20 94 %  Eight        Pre hospital care:  Prehospital IV Access  Pre-Arrival IV Access: Yes  Interventions  Prehospital 12 Lead EKG Reading: NSR  Medications: Aspirin, Nitroglycerin, Other (See Comments)  Prehospital Care  O2 Device: None (Room air)    Allergies   Allergen Reactions   ??? Ketorolac Tromethamine    ??? Tramadol        History   63M active smoker h/o CVA, MI 2013 p/w chest pain. Pt was walking outside when he had left sided chest pressure, radiating to left jaw, associated with nausea and near syncope. Denies SOB, fever, cough, leg swelling. Feels similar to previous MI.       The history is provided by the patient and medical records.   Chest Pain   The chest pain began less than 1 hour ago. Chest pain occurs constantly. The chest pain is unchanged. The pain is associated with exertion. At its most intense, the chest pain is at 9/10. The chest pain is currently at 8/10. The severity of the chest pain is moderate. The quality of the pain is described as heavy and pressure-like. The pain radiates to the left jaw. Primary symptoms include nausea and dizziness. Pertinent negatives for primary symptoms include no fever, no shortness of breath, no cough, no palpitations, no abdominal pain and no vomiting.   Dizziness also occurs with nausea. Dizziness does not occur with vomiting.   He tried nitroglycerin and aspirin for the symptoms. Risk factors include male gender and smoking/tobacco exposure.   His past medical history is significant for CAD and strokes. Past Medical History:   Diagnosis Date   ??? Heart attack (HCC/RAF)    ??? Hyperlipidemia    ??? Hypertension    ??? TIA (transient ischemic attack)         Past Surgical History:   Procedure Laterality Date   ??? BACK SURGERY     ??? CRANIOPLASTY FOR CRANIAL DEFECT     ??? KNEE JOINT MANIPULATION          Past Family History   family history is not on file.     Past Social History   he reports that he has been smoking Cigarettes.  He has been smoking about 0.06 packs per day. He has never used smokeless tobacco. He reports that he uses drugs, including Marijuana. He reports that he does not currently engage in sexual activity. He reports that he does not drink alcohol.     Review of Systems   Constitutional: Negative for fever.   HENT: Negative.    Eyes: Negative.    Respiratory: Negative for cough and shortness of breath.    Cardiovascular: Positive for chest pain. Negative for palpitations and leg swelling.   Gastrointestinal: Positive for nausea. Negative for abdominal pain and vomiting.   Musculoskeletal: Negative.    Skin: Negative.    Neurological: Positive for dizziness.   Psychiatric/Behavioral: Negative.  Physical Exam   Physical Exam   Constitutional: He is oriented to person, place, and time. He appears well-developed and well-nourished. No distress.   HENT:   Head: Normocephalic and atraumatic.   Mouth/Throat: Oropharynx is clear and moist.   Eyes: Pupils are equal, round, and reactive to light. Conjunctivae and EOM are normal.   Neck: Normal range of motion.   Cardiovascular: Normal rate, regular rhythm, normal heart sounds and intact distal pulses.    Pulmonary/Chest: Effort normal and breath sounds normal. No respiratory distress. He has no wheezes. He has no rales.   Abdominal: Soft. Bowel sounds are normal. He exhibits no distension. There is no tenderness. There is no rebound and no guarding.   Musculoskeletal: Normal range of motion. He exhibits no edema. Neurological: He is alert and oriented to person, place, and time. No cranial nerve deficit. He exhibits normal muscle tone.   Skin: Skin is dry.   Psychiatric: He has a normal mood and affect.   Nursing note and vitals reviewed.      ED Course     65M h/o CAD, CVA p/w chest pain. Differential diagnosis includes but is not limited to: ACS, infection, MSK.   - EKG  - Labs  - CXR  - Morphine  - Admit for stress given moderate/high risk.     ED Course as of Nov 12 1229   Thu Nov 12, 2017   1229 CXR negative.   [SK]      ED Course User Index  [SK] Sydnee Cabal., MD, MPH       Laboratory Results     Labs Reviewed   CBC (PERFORMABLE) - Abnormal; Notable for the following:        Result Value    Hemoglobin 10.3 (*)     Hematocrit 33.9 (*)     Mean Corpuscular Volume 73.5 (*)     Mean Corpuscular Hemoglobin 22.3 (*)     MCH Concentration 30.4 (*)     Red Cell Distribution Width-SD 52.2 (*)     Red Cell Distribution Width-CV 20.1 (*)     Mean Platelet Volume 9.2 (*)     All other components within normal limits   DIFFERENTIAL, AUTOMATED (PERFORMABLE) - Abnormal; Notable for the following:     Absolute Lymphocyte Count 1.27 (*)     All other components within normal limits   CBC & AUTO DIFFERENTIAL    Narrative:     The following orders were created for panel order CBC & Plt & Diff (Chem C).  Procedure                               Abnormality         Status                     ---------                               -----------         ------                     FBP[[id number]]                          Abnormal            Final result  Differential, Automated[[id number]]      Abnormal            Final result                 Please view results for these tests on the individual orders.   BNP   RAINBOW DRAW TO LABORATORY    Narrative:     The following orders were created for panel order Rainbow Draw to Laboratory (Red) (Chem C).  Procedure                               Abnormality         Status ---------                               -----------         ------                     Extra Red Top (Plastic)[[id number]]                                                       Please view results for these tests on the individual orders.   BASIC METABOLIC PANEL   TROPONIN I   CK, TOTAL   PROTHROMBIN TIME PANEL   APTT   EXTRA RED TOP (PLASTIC)       Imaging Results     XR chest ap portable (1 view)   Final Result by Kathryne Hitch., MD (08/29 1226)   IMPRESSION:        Mild aortic calcification without tortuosity or cardiomegaly, unchanged.    Clear lungs. No consolidation.   No pleural effusions. No pneumothorax.    No acute osseous abnormalities.                        Signed by: Leona Carry   11/12/2017 12:26 PM          Administered Medications     Medication Administration from 11/12/2017 1141 to 11/12/2017 1231       Date/Time Order Dose Route Action Action by Comments     11/12/2017 1229 morphine PF 4 mg/mL inj 4 mg 4 mg IV Push Given China, Salina April, RN           Procedures   Procedures    MDM  {[id number]}    Clinical Impression   No diagnosis found.    Prescriptions     New Prescriptions    No medications on file       Disposition and Follow-up   Disposition:     No future appointments.    Follow up with:  No follow-up provider specified.    Return precautions are specified on After Visit Summary.

## 2017-11-13 ENCOUNTER — Ambulatory Visit

## 2017-11-13 LAB — Iron & Iron Binding Capacity
% SATURATION: 8 ug/dL (ref 262–502)
IRON: 27 ug/dL — ABNORMAL LOW (ref 41–179)

## 2017-11-13 LAB — CBC: MEAN PLATELET VOLUME: 9.5 fL (ref 9.3–13.0)

## 2017-11-13 LAB — Troponin I
TROPONIN I: <0.04 ng/mL (ref <0.1)
TROPONIN INTERPRETATION: NEGATIVE ng/mL (ref <0.1)

## 2017-11-13 LAB — Vitamin B12: VITAMIN B12: 342 pg/mL (ref 254–1060)

## 2017-11-13 LAB — Hgb A1c: HGB A1C - HPLC: 5.7 — ABNORMAL HIGH (ref <5.7)

## 2017-11-13 LAB — Ferritin
FERRITIN: 5 ng/mL — ABNORMAL LOW (ref 8–350)
FERRITIN: 8 ng/mL (ref 8–350)

## 2017-11-13 LAB — Folate,Serum: FOLATE,SERUM: 11.7 ng/mL (ref 8.1–30.4)

## 2017-11-13 LAB — Lipid Panel: CHOLESTEROL,LDL,CALCULATED: 52 mg/dL (ref <100)

## 2017-11-13 MED ADMIN — MORPHINE SULFATE (PF) 4 MG/ML IV SOLN: 4 mg | INTRAVENOUS | @ 10:00:00 | Stop: 2017-11-13 | NDC 00641612525

## 2017-11-13 MED ADMIN — MELATONIN 3 MG PO TABS: 3 mg | ORAL | @ 07:00:00 | Stop: 2017-11-13 | NDC 68094011061

## 2017-11-13 MED ADMIN — MORPHINE SULFATE (PF) 4 MG/ML IV SOLN: 4 mg | INTRAVENOUS | @ 05:00:00 | Stop: 2017-11-13 | NDC 00641612525

## 2017-11-13 MED ADMIN — ONDANSETRON HCL 4 MG/2ML IJ SOLN: 4 mg | INTRAVENOUS | @ 10:00:00 | Stop: 2017-11-13 | NDC 00641607825

## 2017-11-13 MED ADMIN — ATORVASTATIN CALCIUM 20 MG PO TABS: 20 mg | ORAL | @ 05:00:00 | Stop: 2017-11-13 | NDC 00904629161

## 2017-11-13 NOTE — Discharge Summary
Discharge Summary           Name: [name] [name] MRN: [id number] DOB: 1970/04/02   Admit Date: 11/12/2017   D/C Date: No discharge date for patient encounter. LOS:  LOS: 1 day    AdmittingAttending Digish D. Sherryll Burger, MD Discharge Attending Lenda Kelp., MD   PCP Aviva Kluver, MD Discharge Provider Shaquaya Wuellner A. Ardyth Harps, MD     Inpatient Treatment Team Treatment Team:   Team: K, Hospitalist - Team   Consults none   Outpatient Care Team No care team member to display     Admission Diagnosis (reason for admission) Discharge Diagnosis (conditions treated during hospitalization)   Chest pain Atypical Chest Pain     HPI: 47 y/o M with history of NSTEMI 6 years ago, h/o CVA s/p tPA in 2015, who presents to the ED after an episode of exertional CP unrelieved with NTG.  Please see HPI for further details    Hospital Course:    # Atypical chest pain - patient had persistent chest pain during hospitalization.  Troponins were negative, ekgs were stable as were vitals.  The patient was scheduled for a stress test on day of discharge but patient stated he would rather leave the hospital and establish care with a new outpatient doctor as he just relocated here from [place].  Patient advised to follow up in 1 week timeframe.  He was continued on his home medications at discharge of which patient still had a 2 week supply.    Current Hospital Problems           POA    Acute chest pain Yes    Chest pain Yes          Procedures & Operations Performed: none    Relevant Imaging Studies (most recent results only):     Relevant labs:   BMP:   Lab Results   Component Value Date/Time    NA 139 11/12/2017 11:54 AM    K 4.6 11/12/2017 11:54 AM    CL 106 11/12/2017 11:54 AM    CO2 25 11/12/2017 11:54 AM    BUN 12 11/12/2017 11:54 AM    CREAT 0.74 11/12/2017 11:54 AM    CALCIUM 8.7 11/12/2017 11:54 AM    GLUCOSE 115 (H) 11/12/2017 11:54 AM       CBC:   Lab Results   Component Value Date/Time    WBC 5.09 11/13/2017 04:33 AM HGB 10.2 (L) 11/13/2017 04:33 AM    HCT 33.9 (L) 11/13/2017 04:33 AM    PLT 343 11/13/2017 04:33 AM        Other:     Physical Exam: BP 97/53  ~ Pulse 54  ~ Temp 36.6 ???C (97.8 ???F) (Oral)  ~ Resp 16  ~ Ht 1.854 m (6' 1'')  ~ Wt 78.6 kg (173 lb 4.5 oz)  ~ SpO2 96%  ~ BMI 22.86 kg/m???     General appearance: NAD  HEENT: mmm, no scleral icterus  Heart: RRR without murmurs  Lungs: CTAB  Abdomen: soft, NTND, NABS  Extremities: without edema    Outpatient Provider To-Do List   Stress Test     Pending labs   Unresulted Labs     Start     Ordered    11/13/17 0400  Ferritin  Morning draw,   Routine      11/12/17 1829    11/13/17 0400  Vitamin B12  Morning draw,   Routine      11/12/17 1829  11/13/17 0400  Folate,Serum  Morning draw,   Routine      11/12/17 1829    11/13/17 0110  Ferritin  Once,   Routine      11/13/17 0105    11/12/17 1600  Troponin I  Every 6 hours,   STAT      11/12/17 1414    11/12/17 1200  Rainbow Draw to Laboratory (Red) (Chem C)  (Chest Pain Protocol)  Once,   STAT     Question Answer Comment   Light Blue No Labels    Red Top 1 Label    Light Green Top No Labels    Lavender Top No Labels    Dark Green Top No Labels    Gold Top No Labels    Red Top (Glass) No Labels    Gray Top No Labels    Pink Top No Labels    Lt Blue Top (1.4mL) No Labels    Microtainer Lav Top No Labels    Microtainer Green Top No Labels    Microtainer Red Top No Labels        11/12/17 1150    11/12/17 1200  Extra Red Top (Plastic)  PROCEDURE ONCE,   STAT      11/12/17 1154           Discharge medications High-risk medications      Medication List      CONTINUE taking these medications    aspirin 81 mg chewable tablet     LIPITOR PO     LISINOPRIL PO     nitroglycerin 0.4 mg SL tablet  Commonly known as:  Nitrostat         This patient does not have coumadin on their discharge med list    No controlled substances on discharge med list     Nutrition recommendations & malnutrition assessment This patient was either discharged prior to Hospital Day #4, or was recently followed by a Diet Technician rather than an RD; therefore no currently relevant nutrition recommendations or malnutrition assessments.         Discharge diet orders    Diet 2 gram sodium    As directed           Rehab assessment   No PT evaluation this encounter           Discharge activity orders    Activity as tolerated    As directed           Disposition Discharge condition   Home or Self care   stable     Requested appointments Scheduled appointments    Patient to schedule follow up appointment    As directed      In 1 week with a new primary care doctor/cardiologist     No future appointments.     Case Manager/Home Health assessment                             Home Health orders   There are no active discharge home health orders for this encounter.      Goals of Care Note (if new for this encounter)         LACE+ Risk Stratification    Total Score Risk Stratification   0-28 Minimal Risk   29-58 Moderate Risk   59-78 High Risk   79-90 Highest Risk     Readmission Score  58       Total Score        3 Male Patient    15 Urgent Admission    2 Length of Stay    3 ED Visits in Previous 6 Months    35 Charlson Score (by age & number of urgent admissions)        Criteria that do not apply:    Discharge Institution    Alternative Level of Care Status    Elective Admission in Previous Year          I have seen and examined the patient on their discharge day. I have reviewed, edited, and agree with the above discharge summary. More than 30 minutes was personally spent on discharge planning on the day of discharge in coordination of care, counseling the patient and preparation of discharge.    Avyana Puffenbarger A. Ardyth Harps, MD  11/13/2017 6:04 AM

## 2017-11-13 NOTE — Nursing Note
11/12/17 2121   Integumentary   Skin Color Appropriate for ethnicity   Skin Condition/Temp Warm;Dry   Skin Integrity No Known Problems  (skin checked with Elyse JarvisShery, RN)

## 2017-11-13 NOTE — H&P
ATTENDING PHYSICIAN: Hussein Macdougal Sherryll Burger  ???  PRIMARY MEDICAL DOCTOR: None  ???  REASON FOR ADMISSION: Chest pain  ???  DATE OF ADMISSION: 11/12/17  ???  HISTORY OF PRESENT ILLNESS:  ???  This is a pleasant 47 y/o M with history of NSTEMI 6 years ago, h/o CVA s/p tPA in 2015, who presents to the ED after an episode of exertional CP unrelieved with NTG this AM.     Pt states he has been experiencing exertional chest pain intermittently, approximately 2-3x/month, over the last several months, but it is always relieved with NTG. He is otherwise able to go about his iADLs without any issues and his exercise tolerance is adequate. This AM, approximately 10:30-11 AM, pt had an episode of substernal, ''crushing'' chest pain, ''similar to his MI 6 years ago'', which lasted 15-30 mins and was unrelieved with NTG. He states the chest pain was radiating to the jaws, associated with SOB and nausea, otherwise no dizziness/lightheadedness, palpitations, abd pain, vomiting. He was worried about another heart attack. He asked bystanders to call the paramedics and was brought into the ED for further evaluation and treatment.   ???  In the ED, the patient was noted to be afebrile and otherwise HDS, saturating well on RA. First trop/EKG were negative. He was given IV morphine and IV zofran x 1 and is being admitted for further eval and treatment.   ???  PAST MEDICAL/SURGICAL HISTORY:  1) History of NSTEMI 6 years ago (ie CAD, chronic)  2) History of CVA s/p tPA in 2015, no residual deficits  3) Essential hypertension, chronic  4) Hyperlipidemia, chronic  5) History of back surgery  ???  OUTPATIENT MEDICATIONS:  1) ASA 81mg  PO daily  2) Atorvastatin 20mg  PO daily  3) Lisinopril 10mg  PO daily  4) NTG PRN  ???  ALLERGIES:  1) Toradol  2) Tramadol  ???  SOCIAL HISTORY:  - when healthy, is independent with iADLs  - he states ''he lives with roommates''  - denies any active tobacco use, EtOH abuse, illicit drug use  ???  FAMILY HISTORY:  Reviewed and non contributory  ??? REVIEW OF SYSTEMS:   14-point ROS was performed and was otherwise negative  ???  PHYSICAL EXAMINATION:  BP 105/73  ~ Pulse 56  ~ Temp 37.1 ???C (98.8 ???F) (Oral)  ~ Resp 16  ~ SpO2 97%   GENERAL: NAD, appears older than stated age, disheveled, pleasant, A&O x 4  HEENT: NCAT, EOMI, PERRLA, oropharynx clear, mucous membranes moist  NECK: supple, no LAD, trachea midline  LUNGS: CTA-B/L, no W/C/R; no dullness to percussion, no increased WOB  CVS: RRR, normal S1, S2; no M/G/R; PMI is nondisplaced; no e/o JVD; pulses are 2+ and intact throughout; no peripheral edema  ABDOMEN: soft, NT, ND, BS clear; no HSM/masses palpated  EXTREMITIES: FROM, no peripheral C/C/E  SKIN: no abnormal rashes/wounds noted  NEURO: CN II-XII grossly intact, no focal motor/sensory deficits noted; reflexes symmetric b/l  PSYCH: no SI/HI noted, no AVH  ???  DIAGNOSTIC WORK UP (personally reviewed by me):  ???  BMP: Na 139, K 4.6, Cl 106, HCO3 25, BUN 12, Cr 0.74,???Glu 115, [place] 8.7    Trop negative, BNP negative  ???  LFTs: TP 6.9, Alb 3.1, AST/ALT 19/15, AP 62, TB 0.3  ???  CBC: WBC 4.85, Hgb 10.3, HCT 33.9, Platelets 365  ???  INR 1.2    EKG (reviewed by me): NSR, nonischemic  ???  CXR 8/29:  IMPRESSION:  Mild aortic calcification without tortuosity or cardiomegaly, unchanged.   Clear lungs. No consolidation.  No pleural effusions. No pneumothorax.   No acute osseous abnormalities.  ??????  ??????  ASSESSMENT/PLAN:  ???  This is a pleasant 47 y/o M with history of NSTEMI 6 years ago, h/o CVA s/p tPA in 2015, who presents to the ED after an episode of exertional CP unrelieved with NTG this AM.  ???  # Acute exertional chest pain, POA.   # CAD with history of NSTEMI, POA  # History of CVA s/p tPA in 2015, no residual deficits  # Essential hypertension  # Hyperlipidemia  Pt presents with typical-sounding anginal CP, in setting of known CAD with NSTEMI in the past, unrelieved with NTG on this occasion. First trop/EKG negative, pt is otherwise hemodynamically stable - admit to med/surg telemetry  - check serial trops (next @ 4 PM); if next trop negative, will allow diet  - 2D ECHO for further evaluation of cardiac function  - gentle IVF hydration with NS @ 75 cc/hr x 1L  - check A1c, FLP for risk stratification  - NPO p MN for NM stress test in AM given high risk chest pain  - increase home atorvastatin to 80mg  daily  - cont ASA 81mg  PO daily, lisinopril 10mg  daily  - NTG PRN, IV morphine PRN severe pain, monitor for oversedation/respiratory depression while on IV opioids  - clinical monitoring, supportive care as needed    # Anemia, microcytic, likely iron deficiency anemia, POA  - check Fe/TIBC/ferritin/B12/folate  - trend CBC for now    # Concern for possible homelessness, POA. Pt appeared disheveled on exam, states ''he lives with roommates''  - SW consult appreciated  ???  # Other chronic medical conditions as noted above, POA, appear stable for now  - cont with outpatient medication management and follow up otherwise as able  ???  # FEN/PPX  - NPO x meds for now, gentle IVF hydration as above  - SCDs  - SW  ???  # Code Status: FULL CODE   ???  # Dispo: Inpatient   ???  ???  46???mins of time was spent performing chart review, H&P at the bedside in face-face encounter, charting documentation, and discussion of POC with the patient, consultants and medical staff involved.

## 2017-11-13 NOTE — Other
Patients Clinical Goal:   Clinical Goal(s) for the Shift: comfort; safety; VS; control pain  Identify possible barriers to advancing the care plan: none  Stability of the patient: Moderately Stable - low risk of patient condition declining or worsening   End of Shift Summary:     Plan of Care   VSq4; monitor labs inc trop; NPO for stress test today    Notable/significant events and interventions  AOx4. C/o left sided chest pain relieved by IV morphine. EKG and troponin's negative. Resting in bed between care. Difficulty sleeping tonight. Patient is overall calm and cooperative. States he must ''have the door and curtain open at all times'' d/t his ''PTSD.'' sign made for the door.     Additional Notes   Patient is oriented x 4  Patient c/o of left chest pain pain. Pain Managed with IV morphine  BMAT 4. Pt ambulates with supervision.     Review of Discharge Planning  Plan/goals for discharge: home  Barriers:None     Handoff Checklist - Completed at every change of shift  Central Line  no  Foley Catheter  no  High-risk drugs independently verified n/a  Sepsis screen reviewed and completed with 2nd RN?  yes  Pressure injury  no  RN/MD Rounding? yes     [place]

## 2017-11-13 NOTE — Nursing Note
MD called to bedside d/t patient c/o 6/10 left sided chest pain radiated to neck and jaw. Patient lying calm in bed. VS stable. MD stated no need for serial EKGs and OK to give morphine for discomfort. Continue to trend troponins and will f/u with stress test in am. Will continue to monitor.

## 2017-11-13 NOTE — ED Notes
Report given to Valerie, RN.

## 2017-11-13 NOTE — ED Notes
Pt received tray. Tolerating well at this time.

## 2017-11-13 NOTE — Nursing Note
Pt called for RN to notify that he wanted to leave AMA.  Found pt that he had removed his IV and dressing in to his clothes.  Pt appeared to be upset.  Asked if there's a reason why he wants to leave.  Pt refused to answer.  He explained that he just decided that he no longer needed to be in the hospital and just want to go home now.  Offered for him to see MD first, but he refused.  Dr Walker ShadowEstebes paged and notified.  MD came and saw the patient.  He offered the Stress test, but pt refused.  MD ordered to discharge patient.    [id number]:  Discharge instructions printed out and given to pt.  Pt refused to hear the discharge instructions.  He stated that he's able to read it on his own.  Belongings brought with pt.

## 2017-11-30 ENCOUNTER — Emergency Department: Admit: 2017-11-30 | Discharge: 2017-11-30 | Payer: MEDICAID

## 2017-11-30 DIAGNOSIS — R079 Chest pain, unspecified: Secondary | ICD-10-CM

## 2017-11-30 LAB — BMP (EXT)
Anion Gap (EXT): 11 (ref 4–14)
BUN (EXT): 20 mg/dL (ref 6–22)
CO2 (EXT): 21 mmol/L — ABNORMAL LOW (ref 22–32)
CalciumCalcium (EXT): 8.6 mg/dL — ABNORMAL LOW (ref 8.8–10.3)
Chloride (EXT): 106 mmol/L (ref 97–108)
Creatinine (EXT): 0.69 mg/dL (ref 0.61–1.24)
Glucose (EXT): 134 mg/dL (ref 70–199)
Potassium (EXT): 3.5 mmol/L (ref 3.5–5.1)
Sodium (EXT): 138 mmol/L (ref 135–145)
eGFR - Creat MDRD (EXT): 114 mL/min (ref >60)
eGFR - Creat MDRD (EXT): >120 mL/min (ref >60)

## 2017-11-30 LAB — COMPLETE BLOOD COUNT WITH DIFF
Abs Basophils: 0.02 10*9/L (ref 0.0–0.1)
Abs Eosinophils: 0.1 10*9/L (ref 0.0–0.4)
Abs Imm Granulocytes: 0.03 10*9/L (ref <0.1)
Abs Lymphocytes: 1.13 10*9/L (ref 1.0–3.4)
Abs Monocytes: 0.53 10*9/L (ref 0.2–0.8)
Abs Neutrophils: 4.75 10*9/L (ref 1.8–6.8)
Hematocrit: 32 % — ABNORMAL LOW (ref 41–53)
Hemoglobin: 9.9 g/dL — ABNORMAL LOW (ref 13.6–17.5)
MCH: 22.5 pg — ABNORMAL LOW (ref 26–34)
MCHC: 30.9 g/dL — ABNORMAL LOW (ref 31–36)
MCV: 73 fL — ABNORMAL LOW (ref 80–100)
Platelet Count: 317 10*9/L (ref 140–450)
RBC Count: 4.4 10*12/L (ref 4.4–5.9)
WBC Count: 6.6 10*9/L (ref 3.4–10.0)

## 2017-11-30 LAB — BASIC METABOLIC PANEL (NA, K,
Anion Gap: 11 (ref 4–14)
Calcium, total, Serum / Plasma: 8.6 mg/dL — ABNORMAL LOW (ref 8.8–10.3)
Carbon Dioxide, Total: 21 mmol/L — ABNORMAL LOW (ref 22–32)
Chloride, Serum / Plasma: 106 mmol/L (ref 97–108)
Creatinine: 0.69 mg/dL (ref 0.61–1.24)
Glucose, non-fasting: 134 mg/dL (ref 70–199)
Potassium, Serum / Plasma: 3.5 mmol/L (ref 3.5–5.1)
Sodium, Serum / Plasma: 138 mmol/L (ref 135–145)
Urea Nitrogen, Serum / Plasma: 20 mg/dL (ref 6–22)
eGFR - high estimate: >120 mL/min (ref >60)
eGFR - low estimate: 114 mL/min (ref >60)

## 2017-11-30 LAB — TROPONIN I: Troponin I: <0.02 ug/L (ref <0.05)

## 2017-11-30 LAB — ED INFORMATION EXCHANGE ORDER: EDIE Other: 1

## 2017-11-30 MED ORDER — NITROGLYCERIN 2 % TRANSDERMAL OINTMENT
2 % | TRANSDERMAL | Status: DC
  Administered 2017-12-01: 01:00:00 via TOPICAL

## 2017-11-30 MED ORDER — ONDANSETRON HCL (PF) 4 MG/2 ML INJECTION SOLUTION
4 | Freq: Once | INTRAMUSCULAR | Status: AC
Start: 2017-11-30 — End: 2017-11-30
  Administered 2017-11-30: via INTRAVENOUS

## 2017-11-30 MED ORDER — ONDANSETRON HCL (PF) 4 MG/2 ML INJECTION SOLUTION: 4 mg/2 mL | INTRAMUSCULAR | Status: DC | PRN

## 2017-11-30 MED ORDER — NITROGLYCERIN 0.4 MG SUBLINGUAL TABLET: 0.4 mg | SUBLINGUAL | Status: DC | PRN

## 2017-11-30 NOTE — ED Provider Notes (Signed)
ED First Attending   MCDERMOTT, Marion A    History     Chief Complaint   Patient presents with    Chest Pain     BIBA for chest pain while ambulating this afternoon.        History provided by:  Patient  History limited by: nothing    HPI  Andrew Brown is a 47 y.o. male with a history of multiple ED presentations for "cp and stroke" (over 60 hospitals in one year) who reports to the ED for eval of CP.    Reports he was walking when he developed sudden sub-sternal CP that was radiating towards his jaw. + SOB. He reports he has had these symptoms previously, but is unable to comment if he has ever been diagnosed with an acute MI. No light-headedness or syncope. No previous history of venous thromboembolism, recent immobilization, hemoptysis, or new calf swelling or pain. No fever, cough.     He called EMS for these symptoms and endorsed 10/10 CP and received NTG and fentanyl. In the ED, he is requesting fentanyl.      Allergies/Contraindications   Allergen Reactions    Ketorolac     Tramadol     Transparent Dressing        There are no discharge medications for this patient.      No past medical history on file.    No past surgical history on file.    Social History     Socioeconomic History    Marital status: Divorced     Spouse name: Not on file    Number of children: Not on file    Years of education: Not on file    Highest education level: Not on file   Occupational History    Not on file   Social Needs    Financial resource strain: Not on file    Food insecurity:     Worry: Not on file     Inability: Not on file    Transportation needs:     Medical: Not on file     Non-medical: Not on file   Tobacco Use    Smoking status: Not on file   Substance and Sexual Activity    Alcohol use: Not on file    Drug use: Not on file    Sexual activity: Not on file   Lifestyle    Physical activity:     Days per week: Not on file     Minutes per session: Not on file    Stress: Not on file   Relationships     Social connections:     Talks on phone: Not on file     Gets together: Not on file     Attends religious service: Not on file     Active member of club or organization: Not on file     Attends meetings of clubs or organizations: Not on file     Relationship status: Not on file    Intimate partner violence:     Fear of current or ex partner: Not on file     Emotionally abused: Not on file     Physically abused: Not on file     Forced sexual activity: Not on file   Other Topics Concern    Not on file   Social History Narrative    Not on file         No family history on file.  History, Medications and Nursing Notes were reviewed by Altamese Carolina    Review of Systems     Review of Systems   Constitutional: Negative for fever.   Eyes: Negative for visual disturbance.   Respiratory: Positive for shortness of breath.    Cardiovascular: Positive for chest pain.   Gastrointestinal: Negative for vomiting.   Genitourinary: Negative for decreased urine volume.   Musculoskeletal: Negative for neck stiffness.   Skin: Negative for rash.   Allergic/Immunologic: Negative for immunocompromised state.   Neurological: Negative for syncope.   Hematological: Does not bruise/bleed easily.       Physical Exam   Triage Vital Signs:  BP: 99/60, Heart Rate: 74, Pulse - Palpated/Pleth: 80, Temp: 36.8 C (98.2 F), *Resp: 18, SpO2: 97 %    Physical Exam   Constitutional: He is oriented to person, place, and time. No distress.   HENT:   Head: Normocephalic and atraumatic.   Neck: Normal range of motion. Neck supple.   Cardiovascular: Normal rate, regular rhythm and intact distal pulses.   Pulmonary/Chest: Effort normal and breath sounds normal. No respiratory distress.   Abdominal: Soft. He exhibits no distension. There is no tenderness.   Musculoskeletal: Normal range of motion.   Neurological: He is alert and oriented to person, place, and time.   Skin: Skin is warm and dry.   Vitals reviewed.        Interpretations:  Lab,  Imaging, EKG & Rhythm Strip     Labs Reviewed   Troponin I   Result Value Ref Range    Troponin I <0.02 <0.05 ug/L   Complete Blood Count with Differential   Result Value Ref Range    WBC Count 6.6 3.4 - 10.0 x10E9/L    RBC Count 4.40 4.4 - 5.9 x10E12/L    Hemoglobin 9.9 (L) 13.6 - 17.5 g/dL    Hematocrit 32.0 (L) 41 - 53 %    MCV 73 (L) 80 - 100 fL    MCH 22.5 (L) 26 - 34 pg    MCHC 30.9 (L) 31 - 36 g/dL    Platelet Count 317 140 - 450 x10E9/L    Neutrophil Absolute Count 4.75 1.8 - 6.8 x10E9/L    Lymphocyte Abs Cnt 1.13 1.0 - 3.4 x10E9/L    Monocyte Abs Count 0.53 0.2 - 0.8 x10E9/L    Eosinophil Abs Ct 0.10 0.0 - 0.4 x10E9/L    Basophil Abs Count 0.02 0.0 - 0.1 x10E9/L    Imm Gran, Left Shift 0.03 <0.1 [id number]   Basic Metabolic Panel - [place] (NA, K, CL, CO2, BUN, CR, GLU, CA)   Result Value Ref Range    Urea Nitrogen, Serum / Plasma 20 6 - 22 mg/dL    Calcium, total, Serum / Plasma 8.6 (L) 8.8 - 10.3 mg/dL    Chloride, Serum / Plasma 106 97 - 108 mmol/L    Creatinine 0.69 0.61 - 1.24 mg/dL    eGFR if non-African American 114 >60 mL/min    eGFR if African Amer >120 >60 mL/min    Potassium, Serum / Plasma 3.5 3.5 - 5.1 mmol/L    Glucose, non-fasting 134 70 - 199 mg/dL    Sodium, Serum / Plasma 138 135 - 145 mmol/L    Carbon Dioxide, Total 21 (L) 22 - 32 mmol/L    Anion Gap 11 4 - 14   Troponin I   Result Value Ref Range    Troponin I <0.02 <0.05 ug/L  XR Chest 2 Views PA and Lateral   Final Result   FINDINGS/IMPRESSION:      The cardiomediastinal silhouette is normal. The lungs are clear      Report dictated by: Benita Gutter, MD, signed by: Benita Gutter, MD   Department of Radiology and Biomedical Imaging              ED Course (Document differential diagnosis, ED treatment, response to treatment, reasons for choice of disposition, and whether further outpatient workup is needed or if patient is going to OR or ICU)   Briefly, 47 y.o. male w multiple ED visits including 22 different hospital  visits this year with concerns for opioid seeking behavior (has feigned multiple strokes, received TPA > 10 times, has been admitted for CP and AMA after not given opiates) presenting with CP. Initial vital signs and exam unremarkable.     Initial diagnostic considerations include:  Presentation is suggestive of possible acute coronary syndrome given age and RFs. Initial EKG does not meet STEMI criteria. Given low pre-test probability and symptom onset 1 hour PTA, plan to perform serial troponin.     Low suspicion for acute aortic syndrome given symptoms non-maximal at onset, non-tearing pain, does not radiate to neck/extremity/abdomen and patient with an unremarkable exam including no new murmurs, neurological deficits or asymmetric pulses).    Doubt pulmonary embolism (no history of previous blood clot, no signs or symptoms of DVT, normal oxygenation, no respiratory distress or evidence of shock) and negative per [facility], pneumothorax (symmetric breath sounds, trachea midline), pneumonia (no productive cough or fever), or esophageal perforation (no history of forceful vomiting or Valsalva).     Diagnoses of exclusion to consider include malingering, pericarditis, GERD, esophageal spasm/dysmotility, gastritis, and musculoskeletal/inflammatory chest pain.     Plan:  EKG, Troponin, Chest XR, Basic Labs        5:04 PM               Reassessment   Repeat Troponin and EKG negative.     At this time, I believe patient is appropriate for discharge home given reassuring vital signs, unremarkable evaluation and on-going stability while in the ED.     Plan to have patient follow up with outpatient provider within 1-2 days for reassessment of symptoms.      Coding and Billing Info     MDM               Altamese Carolina, MD  Resident  12/01/17 1711       Cecille Po, MD  12/02/17 [phone]

## 2017-11-30 NOTE — Interdisciplinary (Signed)
ED Social Work Note    Referral: transportation to [employer]    Data:   Andrew Brown is a 47 yo male seen in ED for chest pain. Case referred to EDSW for transportation.     Assessment:   EDSW met with pt in ED exam room. Pt A&0x3, able to engage in SW interview.     Resources: Pt reports he resides with a friend in [place] and takes the ferry daily to [facility] for school. Pt reports he has no income and his friend loads his clipper card for him. Pt reports his friend is now in [place], with out cell service, so he is not able to get in touch with him. Pt reports he will be able to get to [place] via BART to [facility], pt provided with [facility] card. Pt declined shelter in [place].     Plan:   -Pt provided with BART ticket and bus token.     Waldon Reining, LCSW  ED social work pager [phone]  [phone]

## 2017-12-01 LAB — TROPONIN I: Troponin I: <0.02 ug/L (ref <0.05)

## 2017-12-09 LAB — ECG 12 LEAD UNIT PERFORMED
Atrial Rate: 69 {beats}/min
Atrial Rate: 73 {beats}/min
Calculated P Axis: 61 degrees
Calculated P Axis: 62 degrees
Calculated R Axis: 42 degrees
Calculated R Axis: 71 degrees
Calculated T Axis: 26 degrees
Calculated T Axis: 56 degrees
P-R Interval: 122 ms
P-R Interval: 148 ms
QRS Duration: 100 ms
QRS Duration: 106 ms
QT Interval: 380 ms
QT Interval: 406 ms
QTcb: 418 ms
QTcb: 435 ms
Ventricular Rate: 69 {beats}/min
Ventricular Rate: 73 {beats}/min

## 2017-12-31 ENCOUNTER — Ambulatory Visit: Payer: MEDICAID

## 2017-12-31 ENCOUNTER — Inpatient Hospital Stay
Admit: 2017-12-31 | Discharge: 2017-12-31 | Disposition: A | Discharge status: Home / Self Care | Payer: Medicaid HMO | Source: Home / Self Care

## 2017-12-31 DIAGNOSIS — R531 Weakness: Secondary | ICD-10-CM

## 2017-12-31 LAB — Prothrombin Time Panel: INR: 1.1 s (ref 11.5–14.4)

## 2017-12-31 LAB — CBC: RED CELL DISTRIBUTION WIDTH-SD: 48.3 fL (ref 36.9–48.3)

## 2017-12-31 LAB — APTT: APTT: 31.2 s (ref 24.4–36.2)

## 2017-12-31 LAB — Troponin I: TROPONIN I: <0.04 ng/mL (ref <0.1)

## 2017-12-31 LAB — BLOOD BANK HOLD SPECIMEN

## 2017-12-31 LAB — Basic Metabolic Panel
ANION GAP: 11 mmol/L (ref 8–19)
UREA NITROGEN: 15 mg/dL (ref 7–22)

## 2017-12-31 LAB — CK, Total: CREATINE PHOSPHOKINASE, TOTAL: 207 U/L (ref 63–473)

## 2017-12-31 LAB — Differential Automated: IMMATURE GRANULOCYTES%: 0.5 (ref 0.00–0.04)

## 2017-12-31 MED ADMIN — IOHEXOL 350 MG/ML IV SOLN: 150 mL | INTRAVENOUS | @ 19:00:00 | Stop: 2017-12-31 | NDC 00407141493

## 2017-12-31 NOTE — ED Notes
Pt updated by Dr. Irven Coe re: negative MRI + no evidence of recent stroke.  Pt states, ''I still can't walk.''  However pt was able to get out of bed and was fully clothed sitting in a chair, out of bed, when RN returned to d/c him.  Pt provided w/ crutches & training by Cindee Lame, EMT.

## 2017-12-31 NOTE — ED Notes
Dr. Selena Batten (neuro) & Onalee Hua, RN (stroke coordinator) arrived in CT s/p brain/neck & perfusion exam.  TPA contraindicated due to administration >3 mos.
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
## 2017-12-31 NOTE — ED Notes
65y M coming to the ED for sudden onset L-sided upper & lower extremity numbness, tingling & weakness around 1045 this am while at [facility].  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  However, prior records indicate pt is homeless (pt does have a residence listed on file in [place]).  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    (-) AMS, pt is A&Ox4 upon ED arrival  (+) reported blurry vision  (+) 8/10 R-sided headache  (+) complete weakness in LLE, LUE is 3/5, RUE & RLE 5/5 strength  (-) n/v  (+) disheveled appearance, malodorous    Hx: stroke x6 per prior records, recent stroke 10.11.19 (treated at [facility] and given TPA, d/c 10.15.19), MI, unstable angina, schizophrenia  (+) many ED visits at facilities ranging from [facility], [place] and [place], chief complaints range from episodes of schizophrenia to SI to chest pain.    Code stroke initiated upon ED arrival & ED RME.  Pt taken to CT scan in ambulance rig; PIV started en-route by RN for contrast/perfusion exam.    LTKW: 1045  ED arrival: 1141  NIHSS: 8 (positives in LUE & LLE motor/paresthesias & mild but seemingly forced dysarthria)  Swallow screen: pass at 1210    Introduced self as primary RN to pt upon bed placement.  Pt ID confirmed on wristband. Privacy acknowledged and potential delays explained; pt needs addressed.  Pt currently A&Ox4, NAD, calm and cooperative but has odd affect.  Will continue to monitor and implement code stroke orders s/p immediate transport to CT.

## 2017-12-31 NOTE — ED Provider Notes
[facility]  Emergency Department Service Report    [name] [name] 47 y.o. male , presents with Extremity Weakness      Triage   Arrived on 12/31/2017 at 11:41 AM   Arrived by SM 1 [67]    ED Triage Vitals   Temp Temp Source BP Heart Rate Resp SpO2 O2 Device Pain Score Weight   12/31/17 1215 12/31/17 1215 12/31/17 1146 12/31/17 1146 12/31/17 1146 12/31/17 1146 12/31/17 1154 12/31/17 1152 12/31/17 1228   36.7 ???C (98.1 ???F) Oral 126/90 84 16 98 % None (Room air) Eight 84.3 kg (185 lb 13.6 oz)       Pre hospital care:  Interventions  Prehospital 12 Lead EKG Reading: NSR    Allergies   Allergen Reactions   ??? Ketorolac Tromethamine    ??? Tramadol        History   Patient is a 47 y.o. male with hx of stroke, TIA who presents to the ED with complaint of gradual onset of L leg numbness and tingling on the L side that started 20 min PTA. Sx are constant, unchanged, with no exacerbating or alleviating factors. Pt seen at [facility] on Friday for stroke and given TPA. D/c 2 days ago. He did not have L-sided numbness/weakness at time of d/c and was ambulatory. He felt like he almost passed out.             The history is provided by the patient. No language interpreter was used.   Extremity Weakness   This is a new problem. The current episode started today. The problem occurs constantly. The problem has been unchanged. Associated symptoms include numbness and weakness. Nothing aggravates the symptoms. He has tried nothing for the symptoms.            Past Medical History:   Diagnosis Date   ??? Heart attack (HCC/RAF)    ??? Hyperlipidemia    ??? Hypertension    ??? Stroke (HCC/RAF)    ??? TIA (transient ischemic attack)         Past Surgical History:   Procedure Laterality Date   ??? BACK SURGERY     ??? CRANIOPLASTY FOR CRANIAL DEFECT     ??? KNEE JOINT MANIPULATION          Past Family History   Family history reviewed by me and there is no pertinent past family history related to patient's current case and/or care. Past Social History   he reports that he has been smoking Cigarettes.  He has been smoking about 0.06 packs per day. He has never used smokeless tobacco. He reports that he uses drugs, including Marijuana. He reports that he does not currently engage in sexual activity. He reports that he does not drink alcohol.     Review of Systems   Musculoskeletal: Positive for extremity weakness.   Neurological: Positive for weakness and numbness.   All other systems reviewed and are negative.      Physical Exam   Physical Exam   Constitutional: He is oriented to person, place, and time. He appears well-developed and well-nourished.   HENT:   Head: Normocephalic and atraumatic.   Mouth/Throat: Oropharynx is clear and moist.   Eyes: Pupils are equal, round, and reactive to light. Conjunctivae are normal.   Neck: Normal range of motion. Neck supple.   Cardiovascular: Normal rate, regular rhythm and normal heart sounds.    Pulmonary/Chest: Effort normal and breath sounds normal.   Abdominal: Soft. Bowel  sounds are normal. There is no tenderness.   Neurological: He is alert and oriented to person, place, and time. He displays no atrophy and no tremor. No cranial nerve deficit. He exhibits normal muscle tone. He displays a negative Romberg sign. He displays no seizure activity. Coordination normal. GCS eye subscore is 4. GCS verbal subscore is 5. GCS motor subscore is 6.     L arm 4/5 strength. Weakness greater in L leg than L arm. Decreased sensations on the L side.    Skin: Skin is warm and dry.   Psychiatric: He has a normal mood and affect. His behavior is normal.   Nursing note and vitals reviewed.      ED Course          Laboratory Results     Labs Reviewed   CBC (PERFORMABLE) - Abnormal; Notable for the following:        Result Value    Hemoglobin 10.5 (*)     Hematocrit 33.4 (*)     Mean Corpuscular Volume 72.3 (*)     Mean Corpuscular Hemoglobin 22.7 (*)     MCH Concentration 31.4 (*) Red Cell Distribution Width-CV 18.6 (*)     Mean Platelet Volume 8.9 (*)     All other components within normal limits   PROTHROMBIN TIME PANEL - Normal   APTT - Normal   CK, TOTAL - Normal   CBC & AUTO DIFFERENTIAL    Narrative:     The following orders were created for panel order CBC & Plt & Diff.  Procedure                               Abnormality         Status                     ---------                               -----------         ------                     ZOX[[id number]]                          Abnormal            Final result               Differential, Automated[[id number]]                          Final result                 Please view results for these tests on the individual orders.   BASIC METABOLIC PANEL   TROPONIN I   RAINBOW DRAW TO LABORATORY    Narrative:     The following orders were created for panel order Brigid Re (ED Adult Blood Draw: Nurse Protocol).  Procedure                               Abnormality         Status                     ---------                               -----------         ------  Pink Top-Blood Bank Hold.Marland KitchenMarland Kitchen[[id number]]                      Final result                 Please view results for these tests on the individual orders.   DIFFERENTIAL, AUTOMATED (PERFORMABLE)   PINK TOP-BLOOD BANK HOLD SPECIMEN       Imaging Results     MR brain wo contrast   Final Result by Stephania Fragmin., MD (10/17 1325)   IMPRESSION:   Motion degraded study. Global cerebral volume loss, without lobar predominance. Otherwise grossly unremarkable.      Signed by: Clarene [place]   12/31/2017 1:25 PM      CT brain stroke wo+w contrast less than 18 hours   Final Result by Stephania Fragmin., MD (10/17 1246)   IMPRESSION:      No intracranial hemorrhage or acute large-vessel territory cortical infarct.      No perfusion abnormality.      No flow-limiting stenosis or occlusion.            Dictated by: Steva Ready   12/31/2017 12:38 PM Signed by: Clarene [place]   12/31/2017 12:46 PM          Administered Medications     Medication Administration from 12/31/2017 1141 to 12/31/2017 2118       Date/Time Order Dose Route Action Action by Comments     12/31/2017 1153 iohexol (Omnipaque) 350 mg/mL inj 150 mL 150 mL Intravenous Given Colen Darling           Procedures   ED ECG interpretation  Date/Time: 12/31/2017 12:10 PM  Performed by: Tera Mater.  Authorized by: Tera Mater     ECG reviewed by ED Physician in the absence of a cardiologist: yes    Interpretation:     Interpretation: normal    Rate:     ECG rate:  67    ECG rate assessment: normal    Rhythm:     Rhythm: sinus rhythm    ST segments:     ST segments:  Normal  T waves:     T waves: normal      Rhythm Strip Interpretation  12/31/2017  1:04 PM  Normal sinus rhythm rate of 65 bpm.     MDM       Patient progress: improved    47 year old male presenting with reported left-sided weakness and numbness.  MRI showed no evidence of acute stroke, mass or bleed.  Concern for malingering given patient has multiple visits to multiple hospitals for chest pain and stroke type workups.  Patient was able to ambulate upon discharge.    I have reviewed the patient's vital signs and nursing notes and any labs or imaging studies that were performed in the ER. I had a detailed discussion with the patient regarding the historical points, exam findings and any diagnostic results supporting the discharge diagnosis. I also discussed the need for outpatient follow up and the need to return to the ED if symptoms worsen or if there are any questions or concerns that arise at home.  The patient is well appearing, tolerating PO's and will follow up with PMD.    Consults:  11:45 AM: CODE NEURO ACTIVATED    11:51 AM Consult with Neurology. Spoke to Dr. Gayland Curry about the patient???s presenting symptoms and exam findings.  Clinical Impression     1. Weakness        Prescriptions Discharge Medication List as of 12/31/2017  1:42 PM          Disposition and Follow-up   Disposition: Discharge [1]    No future appointments.    Follow up with:  Neurology    In 2 days  for further evaluation and treatment      Return precautions are specified on After Visit Summary.          The documentation on this chart was performed by Ladon Applebaum, scribed for Tera Mater., MD    12/31/2017 11:48 AM     All scribe entries and documentation made by the scribe were entered at my direction.  I have reviewed this medical record and agree to the accuracy and completeness of the content entered by the scribe.  The documentation recorded by the scribe accurately reflects the service I personally performed and the decisions made by me.    Tera Mater., MD 9:19 PM 12/31/2017             Tera Mater., MD  12/31/17 2119

## 2017-12-31 NOTE — ED Notes
Pt transported to MRI changed into pt gown via gurney & pt transport.  No monitor/transport RN required per Dr. Irven Coe.

## 2017-12-31 NOTE — ED Notes
COLLECTIVE?NOTIFICATION?12/31/2017 11:41?Jarboe, Nickey?MRN: [id number]    [name]'s patient encounter information:   AVW:?[id number]  Account [id number]  Billing Account [id number]      Criteria Met      6 Visits in 180 Days    2 Visits in 30 Days    Care Recommendation    Security and Safety  No recent Security Events currently on file    ED Care Guidelines from [facility]  Last Updated: 07/28/17 6:46 PM      Care Recommendation:    WARNING/SAFETY ALERT:? *****STROKE SYMPTOMS ? USE CAUTION WHEN CONSIDERING tPA. [facility] WARNING ''PATIENT HAS MORE THAN 20 ADMISSIONS FOR LEFT SIDED WEAKNESS, RECEIVED tPA MORE THAN 10 TIMES.''? 01/21/[facility]    Personal history of TIA and cerebral infarction without residual deficits ? MRI 05/31/2017 ''stable MRI scan of the brain without evidence of acute intracranial abnormality.''? CTA 05/30/2017 ''stable intracranial contents. Negative for acute intracranial abnormality''.      EXTENSIVE ED VISIT HISTORY:? [address] different [address], 6 [place], 6 [place], 7 [place], 1 [place] for multiple complaints (see below).    BACKGROUND: 47 y/o male patient, DOB: November 05, 1970, with a history of MULTIPLE ED visits for stroke-like symptoms, HTN, chest pain, schizophrenia, bipolar, PTSD, suicidal ideations and attempts, major depressive disorder, anxiety, homicidal ideations, unspecified psychosis, nicotine dependence, stated old MI.    PATIENT IDENTIFICATION:? [place] Identification Card    ASSOCIATED CONCERNS/ASSESSMENT:    Radiation Exposure Risk:? Risk unknown as we are unable to identify how many CT's this patient has had across [place], [place], [place] and [place].      ?Addition CTA Results:? 07/05/2015 ? '' Normal CT angiogram without evidence for acute occlusion, stenosis, dissection, or aneurysm.''    03/01/2015 ? '' No acute intracranial abnormality on the unenhanced portion of the study.'' Recommendation:? Review previous testing: frequent ER visits can result in fragmented primary and specialty care.    KEY MEDICAL CONTACTS: No PCP      Insurance: Multiple Insurances listed: [employer] [place], [place] Medicaid, Medi-Cal, [address], Self Pay, and numerous other insurance providers.  These are guidelines and the Donavon Kimrey should exercise clinical judgment when providing care.    Care History  Substance Use/Overdose  12/18/15 12:00 AM  [facility]  Marijuana only per patient.  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  drug seeking behaviors + drug screen for THC and opiates.   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 Visit Count (12 mo.)  Facility Visits   [facility] 2   [facility] 2   [place] [facility] [place] 2   [place] 1   [facility] 1   [place] 1   [facility] 1   [facility] 1   [facility] 1   [facility]  1   [address] and [facility] [employer] 1   [facility] 1   [facility] 2   [facility] 1   [facility] 1   [facility] 1   [facility] 1   [facility] 1   [facility] [place] 1   [facility] 1   [facility] 1   [facility] 1 [facility] 1   [facility] 3   [facility] 1   [facility] 1   [facility] 1   [facility] 1   [facility] 3   [place] [facility] 1   [facility] 1   [place] [facility] [place] 3   [facility] 1   [facility] 1   [facility] 1   [facility] 1   [place] 6   [facility] 1   [facility] 1   [address] [facility] 8   [facility] 1   [facility] 1   [facility] 1   [facility] 1   [facility] [place] 1   Total 60   Note: Visits indicate total known visits.      Recent Emergency Department Visit Summary  Showing 10 most recent visits out of 67 in the past 12 months  Date Facility [facility] Type Diagnoses or Chief Complaint   Dec 31, 2017 [facility] [place] Emergency     Dec 13, 2017 [place]. [place]. [place] Emergency      Suicidal Thoughts      Homicidal      Homicidal ideations      Suicidal ideations      Schizoaffective disorder, bipolar type      Cannabis dependence, uncomplicated      Schizoaffective disorder, unspecified      Cannabis use, unspecified, uncomplicated      Dec 12, 2017 [facility] [place] Emergency  Chief Complaint: CHEST PAIN    Dec 09, 2017 [facility] [place] Emergency      0. CHEST PAIN      Dec 08, 2017 [facility] [place]. [place] Emergency      0. Chest pain, unspecified      2. Hyperlipidemia, unspecified      3. Essential (primary) hypertension      4. Prsnl hx of TIA (TIA), and cereb infrc w/o resid deficits      5. Anemia, unspecified      6. Personal history of nicotine dependence      7. Old myocardial infarction Dec 07, 2017 [facility]. [place] Emergency      1. Chronic obstructive pulmonary disease w (acute) exacerbation      2. Chest pain, unspecified      3. Essential (primary) hypertension      4. Athscl heart disease of native coronary artery w/o ang pctrs      5. Old myocardial infarction      6. Other chronic pain      7. Post-traumatic stress disorder, unspecified      8. Bipolar disorder,  unspecified      9. Schizoaffective disorder, unspecified      10. Hyperlipidemia, unspecified      Dec 05, 2017 [facility] - [facility] [place] Emergency      1. Syncope and collapse      2. Strain of muscle, fascia and tendon at neck level, init      3. Unspecified injury of head, initial encounter      4. Exposure to other specified factors, initial encounter      5. Fall on same level, unspecified, initial encounter      6. Essential (primary) hypertension      7. Athscl heart disease of native coronary artery w/o ang pctrs      8. Hyperlipidemia, unspecified      9. Old myocardial infarction      10. Prsnl hx of TIA (TIA), and cereb infrc w/o resid deficits      Dec 04, 2017 [facility] [place] Emergency      1. Right lower quadrant pain      2. Essential (primary) hypertension      3. Athscl heart disease of native coronary artery w/o ang pctrs      4. Hyperlipidemia, unspecified      5. Old myocardial infarction      6. Other chronic pain      7. Post-traumatic stress disorder, unspecified      8. Major depressive disorder, single episode, unspecified      9. Nicotine dependence, unspecified, uncomplicated      10. Prsnl hx of TIA (TIA), and cereb infrc w/o resid deficits      Dec 01, 2017 [facility]ona Millard - [facility] of the [facility] [place] Emergency  Chief Complaint: CHEST PAIN    Nov 30, 2017 [facility] [place] Emergency      Chest Pain      Chest pain, unspecified          Recent Inpatient Visit Summary Showing 10 most recent visits out of 14 in the past 12 months  Date Facility [facility] Type Diagnoses or Chief Complaint   Dec 09, 2017 [facility] [place] Inpatient      0. CHEST PAIN      Dec 01, 2017 Lona Millard - [facility] of the [facility]      1. Chest pain, unspecified      1. Precordial pain      2. Athscl heart disease of native coronary artery w/o ang pctrs      3. Essential (primary) hypertension      4. Hyperlipidemia, unspecified      5. Generalized anxiety disorder      6. Major depressive disorder, single episode, unspecified      7. Post-traumatic stress disorder, unspecified      8. Nicotine dependence, other tobacco product, uncomplicated      9. Old myocardial infarction      Nov 23, 2017 [facility] [place] Modes Telemetry      1. Bradycardia, unspecified      2. Syncope and collapse      4. Unstable angina      5. UNSTABLE ANGINA      Nov 20, 2017 [place]) [facility]      Chest pain, unspecified      Athscl heart disease of native coronary artery w/o ang pctrs  Syncope and collapse      Strain of muscle, fascia and tendon at neck level, init      Essential (primary) hypertension      Nov 13, 2017 [facility] [place] [place]. [place] Echo/Diagnostic Imaging     Oct 23, 2017 [facility] [place]. [place] Inpatient      Atherosclerosis of aorta      Prsnl hx of TIA (TIA), and cereb infrc w/o resid deficits      Anemia, unspecified      Post-traumatic stress disorder, unspecified      Acute ischemic heart disease, unspecified      Essential (primary) hypertension      Old myocardial infarction      Athscl heart disease of native coronary artery w/o ang pctrs      Oct 18, 2017 [facility] [place] Observation      0. CHEST PAIN MILD HYPOTENSION      0. CP SOB      1. Chest pain, unspecified      2. Bipolar disorder, unspecified      2. Other stimulant dependence, uncomplicated 2. 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 Syncope and collapse      2. Cannabis abuse, uncomplicated      2. Prsnl hx of TIA (TIA), and cereb infrc w/o resid deficits      2. Nicotine dependence, cigarettes, uncomplicated      Oct 01, 2017 [facility] [place] [place] [facility]      Other chest pain      Essential (primary) hypertension      Bipolar disorder, unspecified      Long term (current) use of aspirin      Family hx of ischem heart dis and oth dis of the circ sys      Prsnl hx of TIA (TIA), and cereb infrc w/o resid deficits      Old myocardial infarction      Nicotine dependence, cigarettes, uncomplicated      Post-traumatic stress disorder, unspecified      Hyperlipidemia, unspecified      Jun 07, 2017 [facility] [place]. [place] Telemetry  Chief Complaint: CP    May 30, 2017 [facility] [place] [facility]      0. POSSIBLE BRAIN ATTACK TPA          Care Team  Abi Shoults Specialty Phone Fax Service Dates   BOCK,CINDY L Case Manager/Care Coordinator   Current    DORFMAN, JUSTIN , D.O. Internal Medicine   Current    Jetty Peeks Social Worker: Clinical [phone] Current    [facility] Primary Care   Current    BOCK,CINDY L Case or Care Manager   Current    Jaynie Crumble Primary Care   Current    Ella Jubilee Primary Care [phone] Current    [facility] Primary [facility] [phone]  Current    Dimitri Ped Primary Care [phone] Feb 26, 2016 - Current    [facility] Primary Care   Current    [facility] [address] Primary Care   Current    PCP 0O Primary Care   Current    [facility] Primary Care   Current    [facility] EPIC ADMINISTRATION Primary Care   Current    [facility] [employer]  [employer] Primary Care  [phone] Current      Collective Portal  This patient has registered at the [facility] Emergency Department   For more information visit: [email or web address]   PLEASE NOTE:    1.   Any care recommendations and other clinical information are provided as guidelines or for historical purposes only, and providers should exercise their own clinical judgment when providing care.    2.   You may only use this information for purposes of treatment, payment or health care operations activities, and subject to the limitations of applicable Collective Policies.    3.   You should consult directly with the organization that provided a care guideline or other clinical history with any questions about additional information or accuracy or completeness of information provided.    ? 2019 [place], [employer]. - [email or web address]

## 2017-12-31 NOTE — ED Notes
Dr. Dabby at bedside

## 2017-12-31 NOTE — ED Notes
Patient transported to CT 

## 2017-12-31 NOTE — ED Notes
Pt returned fr MRI, VSS, NAD.

## 2017-12-31 NOTE — Consults
SW seeing pt in ED lobby upon discharge, due to homelessness.   Briefly, pt is a 47 yo male, who came in with cc: Extremity Weakness. Pt with hx of CVA, anxiety, back surgery.   Met with pt in lobby, pt with appropriate clothing, backpack, crutches. Pt denies being homeless, says he is staying in a hostel in [place]. 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  Pt with no family here, no emergency contacts listed, ''not sure who I want to list yet''.  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  Pt asking for new sandwich, was given grilled vegetable, but says he's 'allergic' to squash.  Informed pt that no other sandwiches available, no more [place], offered crackers. Pt asking where cafeteria is, showed him where, pt says he will get something there. Pt also asking for bus pass to get back to his hostel, where his car is parked.  Provided TAP card, pt wearing appropriate clothing, no further SW intervention at this time.

## 2018-01-01 LAB — CREATININE CARE EVERYWHERE
CREATININE CARE EVERYWHERE: 0.81 mg/dL (ref <=1.30)
ESTIMATED GFR CARE EVERYWHERE: 107 mL/min/BSA (ref >=60)

## 2018-01-09 LAB — CMP (EXT)
ALT/SGPT (EXT): 17 U/L (ref <=49)
AST/SGOT (EXT): 28 U/L (ref 17–59)
Albumin (EXT): 4.3 g/dL (ref 3.5–5.0)
Alkaline Phosphatase (EXT): 90 U/L (ref 38–126)
Anion Gap (EXT): 6 mmol/L (ref 6–14)
BUN (EXT): 14 mg/dL (ref 9–20)
Bilirubin, Total (EXT): 0.4 mg/dL (ref 0.2–1.3)
CO2 (EXT): 23 mmol/L (ref 22–30)
CalciumCalcium (EXT): 9 mg/dL (ref 8.4–10.2)
Chloride (EXT): 108 mmol/L — ABNORMAL HIGH (ref 98–107)
Creatinine (EXT): 0.7 mg/dL (ref 0.7–1.3)
Glucose (EXT): 99 mg/dL (ref 70–125)
Osmolality (EXT): 285 mosm/kg (ref 280–305)
Potassium (EXT): 4.1 mmol/L (ref 3.5–5.1)
Protein (EXT): 7 g/dL (ref 6.3–8.2)
Sodium (EXT): 137 mmol/L (ref 137–145)
eGFR - Creat MDRD (EXT): >60 mL/min/{1.73_m2} (ref >60)
eGFR - Creat MDRD (EXT): >60 mL/min/{1.73_m2} (ref >60)

## 2018-01-13 LAB — INR CARE EVERYWHERE: INR CARE EVERYWHERE: 1 (ref 0.8–1.2)

## 2018-01-13 LAB — CREATININE CARE EVERYWHERE
CREATININE CARE EVERYWHERE: 0.77 mg/dL (ref <=1.30)
ESTIMATED GFR CARE EVERYWHERE: 109 mL/min/BSA (ref >=60)

## 2018-01-13 LAB — APTT CARE EVERYWHERE: APTT CARE EVERYWHERE: 31 s (ref 25–37)

## 2018-01-21 LAB — CREATININE CARE EVERYWHERE
CREATININE CARE EVERYWHERE: 0.86 mg/dL (ref <=1.30)
ESTIMATED GFR CARE EVERYWHERE: 104 mL/min/BSA (ref >=60)

## 2018-02-19 ENCOUNTER — Ambulatory Visit: Payer: Medicaid HMO

## 2018-02-19 ENCOUNTER — Ambulatory Visit: Payer: MEDICAID

## 2018-02-19 ENCOUNTER — Inpatient Hospital Stay
Admit: 2018-02-19 | Discharge: 2018-02-20 | Discharge status: Against Medical Advice | Payer: Medicaid HMO | Source: Home / Self Care

## 2018-02-19 DIAGNOSIS — R55 Syncope and collapse: Secondary | ICD-10-CM

## 2018-02-19 LAB — Basic Metabolic Panel
POTASSIUM: 3.9 mmol/L (ref 3.6–5.3)
UREA NITROGEN: 14 mg/dL (ref 7–22)

## 2018-02-19 LAB — CBC: HEMATOCRIT: 32.1 — ABNORMAL LOW (ref 38.5–52.0)

## 2018-02-19 LAB — Troponin I: TROPONIN INTERPRETATION: NEGATIVE ng/mL (ref <0.1)

## 2018-02-19 MED ADMIN — ONDANSETRON HCL 4 MG/2ML IJ SOLN: 4 mg | INTRAVENOUS | @ 20:00:00 | Stop: 2018-02-19 | NDC 00641607825

## 2018-02-19 MED ADMIN — MORPHINE SULFATE (PF) 4 MG/ML IV SOLN: 4 mg | INTRAVENOUS | @ 20:00:00 | Stop: 2018-02-19 | NDC 00641612525

## 2018-02-19 NOTE — H&P
Family Medicine Inpatient History and Physical    PATIENT:  [name] [name]   MRN:  [id number]  DOB:  Aug 16, 1970  DATE OF SERVICE:  02/19/2018    ATTENDING PHYSICIAN: Kristian Covey., MD    SENIOR RESIDENT: Perrin Smack, MD    RESIDENT PHYSICIAN: Doy Hutching. Lysle Morales, MD     PRIMARY CARE PROVIDER: Pcp, No, MD    REASON FOR ADMISSION:   Chief Complaint   Patient presents with   ??? Chest Pain     [place] onset of L-sided chest pain radiating to L-shoulder w/ dizziness. Pt given 1 spray of nitro, zofran & ASA 323mg  en route by EMS.    ??? Neck Pain     reports was attempting to sit down when CP began, but missed seat & fell backwards. Denies LOC.        Subjective:     [name] [name] is a 47 y.o. male with history of psych disorder/schizophrenia, anemia, questionable CVA, and HTN, presenting with chest pain.    Patient notes 8/10 chest pain that started this morning while he [place]. He felt like someone hit him in the chest. At that time he developed tunnel vision and fell and hit his head against the ticket machine. Denies LOC. He associated the chest pain with nausea and some radiation up his neck. He was given ASA 325mg  and Nitroglycerin by EMT which improved his pain slight, currently at 6/10.     He was admitted for atypical chest pain 10/2017. Troponins and EKg were negative. He was scheduled for stress test at that time, however patient decided to leave the hospital. He has not had a stress test before. He notes intermittent chest pain since then improved with nitro last used 3 weeks ago. He notes his chest pain this time was more severe than in the past.     Of note, he reports he had a stroke Jan 24, 2018 in [place] at a community hospital. He does not have records. States he had an ischemic stoke and was given TPA and stayed in the ICU for 2 days. States that he initally had left sided weakness which resolved by discharge. Sent home on ASA daily, and Lipitor 20mg . On further review from Care Everywhere, patient has visited almost 50 different EDs for chest pain, weakness. Has had extensive workup including caths, echos, stress tests, and work-up for strokes. Psych consult notes 06/2017 states he was placed on 5150 given SI. Was hospitalized in the past for unspecified psych disorder.    Meds:  ASA 81  Lisinopril 10 (due tonight)  Lipitor 80  Nitro PRN (uses once/month), last used 3 weeks ago    Fhx: father had two MIs (open heart surgery), 2 strokes, GF triple bypass heart surgery  Surg: bilateral knee arthroscopy, reconstrunctive surgery  Allergies: toradol, tramadol- tramadol  Mood: stable, no SI/HI  Social: lives in hostel, planning to travel to [place] on Monday  Denies ETOH, endorses marijuana use, and half a pack of cigarettes daly    Review of Systems:  A 14 point review of systems was negative.     Initial vitals in the ED were:      ED Triage Vitals   Temp Temp Source BP Heart Rate Resp SpO2 O2 Device Pain Score Weight   02/19/18 1035 02/19/18 1035 02/19/18 1035 02/19/18 1035 02/19/18 1035 02/19/18 1035 02/19/18 1035 02/19/18 1937 02/19/18 1035   36.9 ???C (98.5 ???F)  Oral 120/68 84 14 99 % None (Room air) Zero 81.6 kg (180 lb)     While in the ED, patient was given morphine and Zofran.      Past Medical History:   Diagnosis Date   ??? Heart attack (HCC/[facility])    ??? Hyperlipidemia    ??? Hypertension    ??? Stroke (HCC/[facility])    ??? TIA (transient ischemic attack)       Past Surgical History:   Procedure Laterality Date   ??? BACK SURGERY     ??? CRANIOPLASTY FOR CRANIAL DEFECT     ??? KNEE JOINT MANIPULATION        Medications Prior to Admission   Medication Sig Dispense Refill Last Dose   ??? aspirin 81 mg chewable tablet Chew 81 mg by mouth daily.   02/19/2018 at Unknown time   ??? Atorvastatin Calcium (LIPITOR PO) Take by mouth.   02/19/2018 at Unknown time   ??? LISINOPRIL PO Take by mouth.   02/18/2018 at Unknown time   ??? nitroglycerin 0.4 mg SL tablet Place 0.4 mg under the tongue every five (5) minutes as needed for Chest pain.   Past Week at Unknown time     Allergies: Ketorolac tromethamine; Tramadol; and Plastic tape  No family history on file.  Social History     Socioeconomic History   ??? Marital status: Divorced     Spouse name: Not on file   ??? Number of children: Not on file   ??? Years of education: Not on file   ??? Highest education level: Not on file   Occupational History   ??? Not on file   Social Needs   ??? Financial resource strain: Not on file   ??? Food insecurity:     Worry: Not on file     Inability: Not on file   ??? Transportation needs:     Medical: Not on file     Non-medical: Not on file   Tobacco Use   ??? Smoking status: Current Every Day Smoker     Packs/day: 0.06     Types: Cigarettes   ??? Smokeless tobacco: Never Used   Substance and Sexual Activity   ??? Alcohol use: No   ??? Drug use: Yes     Types: Marijuana   ??? Sexual activity: Not Currently   Lifestyle   ??? Physical activity:     Days per week: Not on file     Minutes per session: Not on file   ??? Stress: Not on file   Relationships   ??? Social connections:     Talks on phone: Not on file     Gets together: Not on file     Attends religious service: Not on file     Active member of club or organization: Not on file     Attends meetings of clubs or organizations: Not on file     Relationship status: Not on file   Other Topics Concern   ??? Not on file   Social History Narrative   ??? Not on file          Objective:     Vital signs in last 24 hours:  Temp:  [36 ???C (96.8 ???F)-36.9 ???C (98.5 ???F)] 36 ???C (96.8 ???F)  Heart Rate:  [56-84] 64  Resp:  [11-14] 13  BP: (102-120)/(63-74) 102/63  NBP Mean:  [76-84] 76  SpO2:  [94 %-99 %] 94 %  Physical Exam:    General:   appears stated age, cooperative, Well-appearing and NAD, unkempt   Skin:   Skin color, texture, turgor normal. No rashes or lesions. Dirt under fingernails   Head:   Normocephalic, without obvious abnormality   Eyes:   PERRL, EOM's intact. Ears:   normal TM's and external ear canals both ears   Nose:  No drainage   Mouth:   oropharynx clear, dentition appropriate.   Neck:  soft and supple and no anterior or posterior lymph adenopathy   Back:  no C-spine, T-spine, or L-spine tenderness, no CVA tenderness.   Lungs:   clear to auscultation bilaterally, normal repiratory effort and no wheezes or crackles   Heart:   regular rate and rhythm, S1, S2 normal, no murmur, click, rub or gallop. No thrills on palpation.   Abdomen:   bowel sounds normoactive. Soft, non-tender, non-distended. No masses appreciated. No rebound or guarding.   GU: Not performed   Extremities:    extremities normal, atraumatic, no cyanosis or edema   Neurologic:   Cranially nerve intact, muscle strength, normal sensation, normal gait   Psychiatric:   mood and affect are within normal limits, A&O x 4/4          Lines and Drains:  Peripheral IV 20 G Left Arm (Active)       Peripheral IV 20 G Left Forearm (Active)       Laboratory Data:   Lab Results   Component Value Date    WBC 6.28 02/19/2018    HGB 10.1 (L) 02/19/2018    HCT 32.1 (L) 02/19/2018    PLT 321 02/19/2018     Lab Results   Component Value Date    NA 137 02/19/2018    K 3.9 02/19/2018    CL 105 02/19/2018    CO2 23 02/19/2018    BUN 14 02/19/2018    CREAT 0.70 02/19/2018    GLUCOSE 102 (H) 02/19/2018      Lab Results   Component Value Date    GFR ... 07/25/2010     Lab Results   Component Value Date    CALCIUM 9.0 02/19/2018     Lab Results   Component Value Date    PHOS 4.2 10/04/2016     Lab Results   Component Value Date    MG 1.7 10/04/2016     Lab Results   Component Value Date    AST 19 10/04/2016    ALT 20 10/04/2016    ALKPHOS 112 10/04/2016    BILITOT <0.2 10/04/2016    BILICON <0.2 10/04/2016    TOTPRO 7.1 10/04/2016    ALBUMIN 4.2 10/04/2016     Lab Results   Component Value Date    AMYLASE 41 10/04/2016    LIPASE 36 10/04/2016     Recent Labs     02/19/18  1625 02/19/18  1057   TROPONIN <0.04 <0.04 Other Studies:   XR chest pa+lat (2 views)   Final Result by Marigene Ehlers., MD (12/06 1144)   IMPRESSION:   No acute cardiopulmonary process.  No consolidations.      Signed by: Vanessa Barbara   02/19/2018 11:44 AM      CT brain wo contrast   Final Result by Rayfield Citizen, MD (12/06 1159)   IMPRESSION:      CT HEAD: No evidence of acute intracranial hemorrhage or mass effect. No evidence of acute skull fracture.      CT CERVICAL  SPINE: No evidence of acute vertebral fracture or subluxation.      Signed by: Rayfield Citizen   02/19/2018 11:59 AM      CT cervical spine wo contrast   Final Result by Rayfield Citizen, MD (12/06 1159)   IMPRESSION:      CT HEAD: No evidence of acute intracranial hemorrhage or mass effect. No evidence of acute skull fracture.      CT CERVICAL SPINE: No evidence of acute vertebral fracture or subluxation.      Signed by: Rayfield Citizen   02/19/2018 11:59 AM        EKG: NSR, no acute ST changes    Assessment & Plan:   [name] [name] is a 47 y.o. male  with history of psych disorder/schizophrenia, anemia, questionable CVA, and HTN, presenting with chest pain. Admitted for r/o of unstable angina, however CP likely related to underlying psychiatric disorder.     #. Atypical Chest pain, acute POA  Not related to exertion. Improved s/p ASA 325 and Nitroglycerin. Cardiac exam unremarkable. Troponin x 2/EKG unremarkable. Patient has a strong family cardiac history, and personal cardiac risk factors, though questionable history. Heart Score 3 points. Patient had cardiac cath 2015 which was unremarkable. Echo 2017 unremarkable with EF of 62%. Had stress test 06/2017 which was unremarkable. Patient was initially scheduled for stress test at last admission (10/2017) however left prior. Of note, patient has had numerous ER visits and cardiac workup for CP. Current CP likely 2/2 to psychiatric disorder as below.  - Nitroglycerin topical PRN  - cardiac monitoring #Unspecified Psychiatric Disorder, chronic POA  #Schizophrenia, chronic POA  #Conversion Disorder, chronic POA  Per Care Everywhere patient has visited almost 50 different EDs for chest pain, weakness over the past years. Has had extensive workup including caths, echos, stress tests, and work-up for strokes. Psych consult notes 06/2017 states he was placed on 5150 given SI. Was hospitalized in the past for unspecified psych disorder. No SI/HI at this time.  -Consider Psych consult    #Hx of head trauma, acute POA  Fell back and hit head with sudden chest pain. Wild mild headache. CT brain/spine negative  -Tylenol 650mg  PO PRN     #. Hx of CVA, chronic POA  Ischemic per patient 01/2018 where he received tPA. No residual weakness. No focal deficits on exam. Of note, patient has presented with numerous ER visit for left sided weakness over the past years. CT/A brain 2018 unremarkable as well as CT today in ER.  -Atorvastatin 40mg  QD (on 20mg  at home)  -ASA 81 mg QD    #. HTN, chronic POA  Controlled.   -Continue home Lisinopril 10mg  QD    #. HLD, chronic POA  Last lipid panel 10/2017 with LDL 41  -Atorvastatin 40mg  QD (on 20mg  at home)    #Anemia, chronic POA  At baseline. Microcytic. Studies consistent with iron deficiency 10/2017  -Ferrous sulfate 325 QOD    #Social: lives in hostel, unkept on exam  -social work consult    #. Skin: Decubitus ulcer absent    #FEN/GI: low fat    #Access: pIV    #Prophylaxis: SCDs    #Code Status: Full Code    #Dispo: likely tomorrow     Discussed with attending, Emme Rosenau, Fontaine No., MD., who agrees with the plan above.    Author:  Doy Hutching. Lysle Morales, MD 02/19/2018 8:15 PM       FM Attending Admit Note:  I evaluated and examined the patient  around the time of admission.  I reviewed the case in detail at that time with Dr. Lysle Morales and agree with the findings, assessments and plans as outlined in Dr. Carey Bullocks note and as we reviewed them including admission for observation overnight for atypical chest pain (originally for stress testing later determined done recently), serial troponins and ECGs, possible psychiatry consult.

## 2018-02-19 NOTE — ED Notes
Pt resting in bed, no cp of nausea. Given food per er md. Will continue to [employer]

## 2018-02-19 NOTE — ED Notes
PT PRESENTS WITH CP SUDDEN ONSET AND HIT HEAD TRYING TO SIT DOWN, NITRO GIVEN MILD CP NOW, DIZZINESS AND HEADACHE

## 2018-02-19 NOTE — ED Notes
PT TO CT SCAN, WILL MEDICATE WHEN HE RETURNS

## 2018-02-19 NOTE — ED Notes
COLLECTIVE?NOTIFICATION?02/19/2018 10:22?Klingel, Keeyon?MRN: [id number]    [name]'s patient encounter information:   AVW:?[id number]  Account [id number]  Billing Account [id number]      Criteria Met      6 Visits in 180 Days    2 Visits in 30 Days    Care Recommendation    Security and Safety  No recent Security Events currently on file    ED Care Guidelines from [facility]. [facility]  Last Updated: 07/28/17 6:46 PM      Care Recommendation:    WARNING/SAFETY ALERT:? *****STROKE SYMPTOMS ? USE CAUTION WHEN CONSIDERING tPA. [facility] WARNING ''PATIENT HAS MORE THAN 20 ADMISSIONS FOR LEFT SIDED WEAKNESS, RECEIVED tPA MORE THAN 10 TIMES.''? 01/21/[facility]    Personal history of TIA and cerebral infarction without residual deficits ? MRI 05/31/2017 ''stable MRI scan of the brain without evidence of acute intracranial abnormality.''? CTA 05/30/2017 ''stable intracranial contents. Negative for acute intracranial abnormality''.      EXTENSIVE ED VISIT HISTORY:? [address] different [address], 6 [place], 6 [place], 7 [place], 1 [place] for multiple complaints (see below).    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    PATIENT IDENTIFICATION:? [place] Identification Card    ASSOCIATED CONCERNS/ASSESSMENT:    Radiation Exposure Risk:? Risk unknown as we are unable to identify how many CT's this patient has had across [place], [place], [place] and [place].      ?Addition CTA Results:? 07/05/2015 ? '' Normal CT angiogram without evidence for acute occlusion, stenosis, dissection, or aneurysm.''    03/01/2015 ? '' No acute intracranial abnormality on the unenhanced portion of the study.'' Recommendation:? Review previous testing: frequent ER visits can result in fragmented primary and specialty care.    KEY MEDICAL CONTACTS: No PCP      Insurance: Multiple Insurances listed: [employer] [place], [place], Medi-Cal, [address], Self Pay, and numerous other insurance providers.  These are guidelines and the Ridhaan Dreibelbis should exercise clinical judgment when providing care.    Care History  Substance Use/Overdose  12/18/15 12:00 AM  [facility]  Marijuana only per patient.  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  drug seeking behaviors + drug screen for THC and opiates.   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 Visit Count (12 mo.)  Facility Visits   [facility] 2   [facility] 3   [facility] [place] 2   [facility] [place] 1   [place] 1   [facility] 1   [facility] ([facility] 1   [facility] 1   [facility] 1  [facility] 1   [address] [facility] 1   [facility] 1   [place]. [facility] 2   [employer] 1   [place] [facility] 1   [facility] 1   [facility] 1   [facility] 1   [facility] 1   [facility] 1   [facility] 1 [facility] 1   [facility] 1   [facility] 1   [facility] 1   [facility] 1   [facility] [place] 3   [facility] 1   [facility] 1   [facility] 1   [facility] 1   [facility] 3    [place] 1   [facility] 1   [facility] [place] 3   [facility] 1   [facility] 1   [facility] 1   [facility] 1   [facility] 1   [place] [facility] [place]) (CCD Exch.) 1   [place] [place] 6   [facility] 1   [facility] 1   [facility] 8   [facility] 1   [facility] 1   [facility] [place] 1   Total 71   Note: Visits indicate total known visits.      Recent Emergency Department Visit Summary  Showing 10 most recent visits out of 71 in the past 12 months  Date Facility [facility] Type Diagnoses or Chief Complaint   Feb 19, 2018 [facility] [place] Emergency     Feb 15, 2018 [facility] [place] Emergency      0. FACIAL DROOP      Feb 13, 2018 [facility] [place] [facility]. [place] Emergency      0. [phone] LAW      Feb 01, 2018 [facility] [place] Emergency      Weakness      Pure hypercholesterolemia, unspecified      Headache      Essential (primary) hypertension      Jan 27, 2018 [facility] [place] Emergency  Chief Complaint: Christella Noa    Jan 08, 2018 [facility]. [place] 1st [place] Direct Admit Unit [place]. [place] Emergency      Chest pain, unspecified      Jan 02, 2018 [facility] [place] [place] Emergency      0. Chest pain, unspecified      2. Anemia, unspecified      3. Essential (primary) hypertension 4. Athscl heart disease of native coronary artery w/o ang pctrs      5. Prsnl hx of TIA (TIA), and cereb infrc w/o resid deficits      Dec 13, 2017 [facility] [place]. [place] Emergency      Suicidal Thoughts      Homicidal      Homicidal ideations      Suicidal ideations      Schizoaffective disorder, bipolar type  Cannabis dependence, uncomplicated      Schizoaffective disorder, unspecified      Cannabis use, unspecified, uncomplicated      Dec 12, 2017 [facility] [place] Emergency  Chief Complaint: CHEST PAIN    Dec 09, 2017 [facility] [place] Emergency      0. CHEST PAIN          Recent Inpatient Visit Summary  Showing 10 most recent visits out of 17 in the past 12 months  Date Facility [facility] Type Diagnoses or Chief Complaint   Feb 15, 2018 [facility] [place] [facility]      0. FACIAL DROOP LEFT SIDE WEAK HEADACHE CVA      1. Headache      2. Essential (primary) hypertension      2. Family hx of ischem heart dis and oth dis of the circ sys      2. Other nonmedicinal substance allergy status      2. Pure hypercholesterolemia, unspecified      2. Prsnl hx of TIA (TIA), and cereb infrc w/o resid deficits      2. Schizoaffective disorder, unspecified      2. Cannabis use, unspecified, uncomplicated      2. Bipolar disorder, unspecified      Jan 27, 2018 [facility]. [place] [facility] Surgical      1. Cerebral infarction, unspecified      1. Weakness      2. Slurred speech      3. Anesthesia of skin      4. Nicotine dependence, cigarettes, uncomplicated      5. Essential (primary) hypertension      6. Major depressive disorder, single episode, unspecified      7. Schizophrenia, unspecified      8. Hyperlipidemia, unspecified      9. Athscl heart disease of native coronary artery w/o ang pctrs      Jan 13, 2018 [facility] [place]. [place] Inpatient      Transient cerebral ischemic attack, unspecified Cannabis dependence, uncomplicated      Hyperlipidemia, unspecified      Other disorders of autonomic nervous system      Acute ischemic heart disease, unspecified      Unspecified mood [affective] disorder      Essential (primary) hypertension      1 NIHSS score      Old myocardial infarction      Nicotine dependence, cigarettes, uncomplicated      Dec 19, 2017 [facility] ([facility]D [facility]ch[facility] [place] Inpatient      Other chest pain      Syncope and collapse      Chest pain, unspecified      Dec 09, 2017 [facility]      0. CHEST PAIN      Dec 01, 2017 [facility]      1. Chest pain, unspecified      1. Precordial pain      2. Athscl heart disease of native coronary artery w/o ang pctrs      3. Essential (primary) hypertension      4. Hyperlipidemia, unspecified      5. Generalized anxiety disorder      6. Major depressive disorder, single episode, unspecified      7. Post-traumatic stress disorder, unspecified  8. Nicotine dependence, other tobacco product, uncomplicated      9. Old myocardial infarction      Nov 23, 2017 [facility] [place] Modes. [place] Telemetry      1. Bradycardia, unspecified      2. Syncope and collapse      4. Unstable angina      5. UNSTABLE ANGINA      Nov 20, 2017 [place] ([place]) [place]. [place] Medical Surgical      Chest pain, unspecified      Athscl heart disease of native coronary artery w/o ang pctrs      Syncope and collapse      Strain of muscle, fascia and tendon at neck level, init      Essential (primary) hypertension      Nov 13, 2017 [facility]. [place] Echo/Diagnostic Imaging     Oct 23, 2017 [facility] [place] Inpatient      Atherosclerosis of aorta      Prsnl hx of TIA (TIA), and cereb infrc w/o resid deficits      Anemia, unspecified      Post-traumatic stress disorder, unspecified      Acute ischemic heart disease, unspecified Essential (primary) hypertension      Old myocardial infarction      Athscl heart disease of native coronary artery w/o ang pctrs          Care Team  Zitlaly Malson Specialty Phone Fax Service Dates   [facility], [place] , D.O. Internal Medicine   Current    [place] [facility]   Current    BOCK,CINDY L Case or Care Manager   Current    Jaynie Crumble Primary Care   Current    Ella Jubilee Primary Care [phone] Current    [facility] [phone]  Current    Dimitri Ped Primary Care [phone] Feb 26, 2016 - Current    Colvin Caroli Primary Care   Current    [facility] Primary Care   Current    PCP 0O Primary Care   Current    [facility] Primary Care   Current    [facility] Primary Care   Current    [facility] Primary Care  [phone] Current      Collective Portal  This patient has registered at the [facility] Emergency Department   For more information visit: [email or web address]   PLEASE NOTE:     1.   Any care recommendations and other clinical information are provided as guidelines or for historical purposes only, and providers should exercise their own clinical judgment when providing care.    2.   You may only use this information for purposes of treatment, payment or health care operations activities, and subject to the limitations of applicable Collective Policies.    3.   You should consult directly with the organization that provided a care guideline or other clinical history with any questions about additional information or accuracy or completeness of information provided.    ? 2019 [place], [employer]. - [email or web address]

## 2018-02-19 NOTE — ED Provider Notes
[facility]  Emergency Department Service Report    [name] [name] 47 y.o. male , presents with Chest Pain and Neck Pain      Triage   Arrived on 02/19/2018 at 10:22 AM   Arrived by SM 4 [70]    ED Triage Vitals [02/19/18 1035]   Temp Temp Source BP Heart Rate Resp SpO2 O2 Device Pain Score Weight   36.9 ???C (98.5 ???F) Oral 120/68 84 14 99 % None (Room air) -- 81.6 kg (180 lb)       Pre hospital care:  Prehospital IV Access  Pre-Arrival IV Access: Yes  Interventions  Medications: Nitroglycerin, Other (See Comments), ASA    Allergies   Allergen Reactions   ??? Ketorolac Tromethamine    ??? Tramadol        History   71M h/o CVA 11/19 s/p tpa and resolution of sxs in [place], [place] p/w chest pain. Pt states walking off metro, had substernal aching pain, radiating to L shoulder, palps and near syncope. Fell back against wall striking back of head. Denies LOC, just near syncope. Associated with nausea, SOB. Denies diaphoresis, focal weakness, numbness, HA.     Of note, pt had admission for CP in august but left AMA prior to planned stress test. Pt did not receive any stress test in interim as outpatient or otherwise.     The history is provided by the patient, medical records and the EMS personnel.   Chest Pain   The chest pain began less than 1 hour ago. The chest pain is improving. The pain is associated with exertion. At its most intense, the chest pain is at 8/10. The chest pain is currently at 5/10. The quality of the pain is described as aching and dull. The pain radiates to the left shoulder. Chest pain is worsened by exertion. Primary symptoms include syncope (near syncope), shortness of breath, palpitations, nausea and dizziness. Pertinent negatives for primary symptoms include no fever, no fatigue, no cough, no wheezing, no abdominal pain, no vomiting and no altered mental status.   Before the onset of the syncopal episode there was visual change (blurry, tunnel vision), dizziness, weakness and nausea. There was no vertigo or sweating. The episode occurred on exertion. The syncopal episode occurred with palpitations and shortness of breath. The syncopal episode did not occur with headaches, focal sensory loss or focal weakness. There was no post-event confusion. There was no urinary incontinence with syncope. There is history of transient ischemic attack.   The palpitations began less than 1 hour ago. An episode of palpitations lasts for 5 to 10 minutes. The palpitations also occurred with dizziness and shortness of breath.     Dizziness also occurs with nausea and weakness. Dizziness does not occur with vomiting.     Associated symptoms include weakness.   Pertinent negatives for associated symptoms include no orthopnea and no paroxysmal nocturnal dyspnea. He tried nitroglycerin and aspirin for the symptoms. Risk factors include male gender and smoking/tobacco exposure.   His past medical history is significant for hypertension, MI, strokes and TIA.       Neck Pain    Associated symptoms include visual change (blurry, tunnel vision), chest pain, syncope (near syncope) and weakness. Pertinent negatives include no headaches.            Past Medical History:   Diagnosis Date   ??? Heart attack (HCC/RAF)    ??? Hyperlipidemia    ??? Hypertension    ??? Stroke (  HCC/RAF)    ??? TIA (transient ischemic attack)         Past Surgical History:   Procedure Laterality Date   ??? BACK SURGERY     ??? CRANIOPLASTY FOR CRANIAL DEFECT     ??? KNEE JOINT MANIPULATION          Past Family History   family history is not on file.     Past Social History   he reports that he has been smoking cigarettes. He has been smoking about 0.06 packs per day. He has never used smokeless tobacco. He reports current drug use. Drug: Marijuana. He reports previously being sexually active. He reports that he does not drink alcohol.     Review of Systems   Constitutional: Negative for fatigue and fever. HENT: Negative.    Eyes: Negative.    Respiratory: Positive for shortness of breath. Negative for cough and wheezing.    Cardiovascular: Positive for chest pain, palpitations and syncope (near syncope). Negative for orthopnea.   Gastrointestinal: Positive for nausea. Negative for abdominal pain, diarrhea and vomiting.   Genitourinary: Negative.    Musculoskeletal: Positive for neck pain. Negative for back pain.   Skin: Negative.    Neurological: Positive for dizziness and weakness. Negative for vertigo, focal weakness and headaches.   Psychiatric/Behavioral: Negative.        Physical Exam   Physical Exam  Vitals signs and nursing note reviewed.   Constitutional:       General: He is not in acute distress.     Appearance: Normal appearance. He is normal weight.   HENT:      Head: Normocephalic and atraumatic.      Nose: Nose normal.      Mouth/Throat:      Mouth: Mucous membranes are moist.      Pharynx: Oropharynx is clear.   Eyes:      Extraocular Movements: Extraocular movements intact.      Conjunctiva/sclera: Conjunctivae normal.      Pupils: Pupils are equal, round, and reactive to light.   Neck:      Musculoskeletal: Normal range of motion and neck supple. Muscular tenderness (mid/high cervical spine) present.   Cardiovascular:      Rate and Rhythm: Normal rate and regular rhythm.      Pulses: Normal pulses.      Heart sounds: Normal heart sounds. No murmur.   Pulmonary:      Effort: Pulmonary effort is normal. No respiratory distress.      Breath sounds: Normal breath sounds. No stridor. No wheezing, rhonchi or rales.   Abdominal:      General: Abdomen is flat. Bowel sounds are normal. There is no distension.      Palpations: Abdomen is soft.      Tenderness: There is no abdominal tenderness. There is no guarding.   Musculoskeletal: Normal range of motion.         General: No swelling.      Right lower leg: No edema.      Left lower leg: No edema.   Skin:     General: Skin is warm and dry. Capillary Refill: Capillary refill takes less than 2 seconds.   Neurological:      General: No focal deficit present.      Mental Status: He is alert and oriented to person, place, and time.      Cranial Nerves: No cranial nerve deficit.      Sensory: No sensory deficit.  Motor: No weakness.         ED Course     6M h/o MI, CVA p/w chest pain, near syncope. Differential diagnosis includes but is not limited to: ACS, arrhythmia, dehydration, infection, MSK, intracranial injury.   - EKG  - Labs  - CXR  [facility], CT cspine  - Pain control    ED Course as of Feb 20 1351   Fri Feb 19, 2018   1149 CXR unremarkable.     [SK]   1200 Labs wnl.     [SK]   1207 CTH and Cspine WNL.     [SK]   1241 Admit requested for moderate risk ACS and stress testing as appropriate.     [SK]      ED Course User Index  [SK] Sydnee Cabal., MD, MPH       Laboratory Results     Labs Reviewed   BASIC METABOLIC PANEL - Abnormal; Notable for the following components:       Result Value    Glucose 102 (*)     All other components within normal limits   CBC - Abnormal; Notable for the following components:    Red Blood Cell Count 4.34 (*)     Hemoglobin 10.1 (*)     Hematocrit 32.1 (*)     Mean Corpuscular Volume 74.0 (*)     Mean Corpuscular Hemoglobin 23.3 (*)     Red Cell Distribution Width-SD 49.9 (*)     Red Cell Distribution Width-CV 18.6 (*)     All other components within normal limits   TROPONIN I       Imaging Results     XR chest pa+lat (2 views)   Final Result by Marigene Ehlers., MD (12/06 1144)   IMPRESSION:   No acute cardiopulmonary process.  No consolidations.      Signed by: Vanessa Barbara   02/19/2018 11:44 AM      CT brain wo contrast   Final Result by Rayfield Citizen, MD (12/06 1159)   IMPRESSION:      CT HEAD: No evidence of acute intracranial hemorrhage or mass effect. No evidence of acute skull fracture.      CT CERVICAL SPINE: No evidence of acute vertebral fracture or subluxation. Signed by: Rayfield Citizen   02/19/2018 11:59 AM      CT cervical spine wo contrast   Final Result by Rayfield Citizen, MD (12/06 1159)   IMPRESSION:      CT HEAD: No evidence of acute intracranial hemorrhage or mass effect. No evidence of acute skull fracture.      CT CERVICAL SPINE: No evidence of acute vertebral fracture or subluxation.      Signed by: Rayfield Citizen   02/19/2018 11:59 AM          Administered Medications     Medication Administration from 02/19/2018 1023 to 02/19/2018 1352       Date/Time Order Dose Route Action Action by Comments     02/19/2018 1151 morphine PF 4 mg/mL inj 4 mg 4 mg IV Push Given Patsy [place], [place]      02/19/2018 1151 ondansetron 4 mg/2 mL inj 4 mg 4 mg IV Push Given Marjory Lies, RN           Procedures   ED ECG interpretation  Date/Time: 02/19/2018 11:01 AM  Performed by: Sydnee Cabal., MD, MPH  Authorized by: Sydnee Cabal., MD, MPH     ECG  reviewed by ED Physician in the absence of a cardiologist: yes    Previous ECG:     Previous ECG:  Compared to current    Similarity:  No change  Interpretation:     Interpretation: normal    Rate:     ECG rate:  69    ECG rate assessment: normal    Rhythm:     Rhythm: sinus rhythm    Ectopy:     Ectopy: none    QRS:     QRS axis:  Normal    QRS intervals:  Normal  Conduction:     Conduction: normal    ST segments:     ST segments:  Normal  T waves:     T waves: normal          MDM  Number of Diagnoses or Management Options  Acute chest pain: established, worsening  Near syncope: new, needed workup     Amount and/or Complexity of Data Reviewed  Clinical lab tests: ordered and reviewed  Tests in the radiology section of CPT???: ordered and reviewed  Tests in the medicine section of CPT???: ordered and reviewed  Decide to obtain previous medical records or to obtain history from someone other than the patient: yes  Obtain history from someone other than the patient: yes Review and summarize past medical records: yes  Discuss the patient with other providers: yes  Independent visualization of images, tracings, or specimens: yes    Risk of Complications, Morbidity, and/or Mortality  Presenting problems: moderate    Patient Progress  Patient progress: stable    Data Reviewed/Counseling: I have reviewed the patient's vital signs, nursing notes and old medical records. I had a detailed discussion with the patient regarding the historical points, exam findings, and any diagnostic results supporting the admit diagnosis. I also discussed lab results, radiology results and the need for further workup and treatment in the hospital.        1350 Transition of Care: After a detailed discussion of the patients care, care is transferred to [facility] Medicine and they will follow-up on pending studies.          Clinical Impression     1. Acute chest pain    2. Near syncope        Prescriptions     New Prescriptions    No medications on file       Disposition and Follow-up   Disposition:     No future appointments.    Follow up with:  No follow-up provider specified.    Return precautions are specified on After Visit Summary.                 Sydnee Cabal., MD, MPH  02/19/18 1352

## 2018-02-20 LAB — Drugs of Abuse Scn,Ur: BARBITURATES: NEGATIVE ng/mL

## 2018-02-20 LAB — Troponin I: TROPONIN INTERPRETATION: NEGATIVE ng/mL (ref <0.1)

## 2018-02-20 MED ADMIN — FERROUS SULFATE 325 (65 FE) MG PO TBEC: 325 mg | ORAL | @ 05:00:00 | Stop: 2018-02-20 | NDC 00245010801

## 2018-02-20 MED ADMIN — LISINOPRIL 10 MG PO TABS: 10 mg | ORAL | @ 05:00:00 | Stop: 2018-02-20 | NDC 60687032501

## 2018-02-20 NOTE — Other
Patients Clinical Goal:      Identify possible barriers to advancing the care plan: n/a  Stability of the patient: Moderately Stable - low risk of patient condition declining or worsening   End of Shift Summary:     Overview of Vitals/Critical Labs  Last set of Vitals: BP 112/75 (Patient Position: Lying)  ~ Pulse 59  ~ Temp 36 C (96.8 F) (Oral)  ~ Resp 12  ~ Ht 1.854 m (6' 1'') Comment: stated ~ Wt 82.8 kg (182 lb 9.6 oz)  ~ SpO2 93%  ~ BMI 24.09 kg/m   Pain: pt denies any pain.    Review of Systems/Interventions:  Neuro: A&Ox4, calm and cooperative, able to make needs known.   CV/Respiratory: NSR on tele monitor. SpO2 >93% on RA.   GI/GU: Abdomen soft and non distended. Last BM UTA. Voiding.   Skin/LDAs: Skin intact. PIV #20 LA, #20 LFA SL.  Mobility: BMAT 4.      During this shift:  -Pt. Non- compliant, refusing ECG, continuous pulse Ox, and temp check, MD notified. Will carry orders.  -Pt left AMA around 0615, pt verbalized understanding of how leaving AMA will affect him, MD notified. Pt escorted off unit. Pt left with all his belongings and appears anxious.     Procedures/Test/Consults  Done today: Urine drug screen-results in chart  Pending: n/a    Review of DC planning:  POC: Fall precautions, monitor HR   Disposition: D/C Dispo pending (pt lives in a hostel), pending SW eval.

## 2018-02-20 NOTE — Interdisciplinary
DISCHARGE ASSESSMENT NOTE    Name:  [name] [name]  MRN: [id number]  DOB: 04-Apr-1970    Date of Admit: 02/19/18    Anticipated Date of Discharge: 02/20/18    Primary Care Provider:  Aviva Kluver, MD  Attending Physician: Kristian Covey., MD  Hospitalist Team: [facility] Medicine Teaching    Anticipated Disposition:  TBD    Discharge Needs Assessment Assessment Needs & Action Completed   I. Medical Care Needs    [x]  None  []  Visiting RN  []  PT/OT/ST/SW  []  Home IV antibiotics  []  Wound care  []  Other   I. Medical Care Needs & Action    []  None  []  Complete HH order  []  PT/OT/ST/SW eval request  []  Complete home IV abx request  []  Wound care HH referral   []  Other   I. Medical Care Needs Completed    []  No need  []  H/H orders completed  []  HH pharmacy confirmed  []  H/H wound care completed  []  Other     II. Equipment Needs    [x]  None  []  FWW or crutches  []  Bedside commode  []  Shower Chair  []  Oxygen  []  Wound Vac  []  Other   II. Equipment Needs & Action    []  None  []  DME order in CC or  Paperwork  []  ABG or pulse ox documentation  []  Complete wound vac form   []  Other    II. Equipment Needs Completed    []  No Need  []  DME delivered or arranged  []  HH order completed   []  Wound vac delivered   []  Other     III. Transportation    []  Family or friend   []  Ambulance  []  Taxi[employer]ber  []  Other   III. Transportation Needs & Action    []  Contact Family or friend.   []  Arrange Ambulance   []  Arrange Taxi/Uber   []  Other   III. Transportation Needs Completed    []  Family/ friend confirmed   []  Ambulance arranged   []  Taxi/Uber arranged   []  Other     IV. Social Needs    []  None  []  Family/next of kin not identified  []  Homeless    []  Insufficient care at home  []  Insufficient funds   []  Needs fund request  [x]  Other ( Pt. lives in a hostel)  IV. Social Needs & Action    []  None  []  Identify issue and consult SW   []  Housing resources provided  []  Caregiver list provided  []  Fund requested  []  Other     IV. Social Needs Completed    []  No need []  Completed  []  LA recoup/shlelter  []  Care giver hired  []  Fund approved  []  Other       V. Caretaker Needs    [x]  None  []  Identify contact person  []  24 hour caregiver  []  Other    V. Caretaker Needs & Action    []  None  []  Confirm contact person  []  Confirm caregiver  []  Other   V. Caretaker Needs completed    []  No need  []  Contact person confirmed.   []  Caregiver confirmed  []  Other     VI: Funding Issues    []  None  []  Comment:       VI: Funding needs & Action:    []  None  []  Comment:    VI: Funding Needs    []  CM/SW: [employer]  Approved  []  Supplies delivered      VII: Pharmacy      []  [address]  []  Preferred pharmacy information  []  Admission medication recon       VII: Pharmacy Needs  []  Prior Auth  []  Drug coverage  []  Controlled Rx faxed  []  Patient informed about medication copayment  []  Weekend discharge  []  Discharge medication reviewed     VII: Medication funding      []  CM/SW: Fund approved  []  Medications delivered       MD: RN ROUNDING completed: []  YES         []  NO      LACE+ Risk Stratification    Total Score Risk Stratification   0-28 Minimal Risk   29-58 Moderate Risk   59-78 High Risk   79-90 Highest Risk         BOOST ITEMS     MET UNMET PENDING N/A   P1 # Problems with Medications    MD/RN Rounds with pharmacist       []              []          []             []         []            []        [x]           [x]      P2 # Psychological       []          []         []        [x]      P3 # Principal Diagnosis      []          []         []        [x]      P4 # Physical Limitations      []          []         []        [x]      P5 # Health Literacy      []          []         []        []      P6 # Patient support      []          []         [x]        []      P7 # Prior Hospitalizations      []          []          [x]          []        P8 # Palliative Care      []          []         []        [x]

## 2018-02-20 NOTE — Nursing Note
Nursing Admit Note  Brief summary of pt status:      89 y/oM  presented to the ED w/ chest pain, near syncope.   Pt arrived to floor via gurney, a/ox4,  denies pain or sob.  Poc reviewed, pt orientated to rm & surroundings & made comfortable.     Past medical history: MI, Htn, Hyperlipidemia, CVA 11/19 s/p tpa and [place] of sxs in [place].    Skin Assessment: Verified with 2nd RN, Intact.  Interventions completed in ER:   Trop (--), Ekg, pain management  XR chest pa+lat (2 views)   Final Result by Marigene EhlersLoo, Jerry T., MD (12/06 1144)   IMPRESSION:   No acute cardiopulmonary process.  No consolidations.      Signed by: Vanessa BarbaraJerry Loo   02/19/2018 11:44 AM      CT brain wo contrast   Final Result by Rayfield CitizenLinetsky, Michael, MD (12/06 1159)   IMPRESSION:      CT HEAD: No evidence of acute intracranial hemorrhage or mass effect. No evidence of acute skull fracture.      CT CERVICAL SPINE: No evidence of acute vertebral fracture or subluxation.      Signed by: Rayfield CitizenMichael Linetsky   02/19/2018 11:59 AM      CT cervical spine wo contrast   Final Result by Rayfield CitizenLinetsky, Michael, MD (12/06 1159)   IMPRESSION:      CT HEAD: No evidence of acute intracranial hemorrhage or mass effect. No evidence of acute skull fracture.      CT CERVICAL SPINE: No evidence of acute vertebral fracture or subluxation.       F/u:   2 nd trop done (-) @ 1730, pt now able to eat    Belongings: Reviewed by CCP     Teaching:  Smoking cessation  Med regimen, safety  Admission folder & teaching materials provided to pt:  Yes  1830:  Seen by Dr Vivianne MasterSur @ bs.  F/u stress test to facilitate d/c home.      Vitals:  Last Recorded Vital Signs:    02/19/18 1617   BP: 107/72   Pulse: 56   Resp: 12   Temp: 36.1 C (97 F)   SpO2: 97%       M Martinez-Garcia  RN-BC, BSN, PHN, CNlll  [facility]

## 2018-02-21 LAB — MRSA Surveillance

## 2018-02-22 NOTE — Progress Notes
Patient had eloped prior to evaluation by MD and prior to being explained the risks and benefits of continued hospitalization.

## 2018-02-24 LAB — LAB EXTERNAL RESULT UNMAPPED: ANTIBODY SCREEN CARE EVERYWHERE: NEGATIVE

## 2018-02-24 LAB — CREATININE CARE EVERYWHERE
CREATININE CARE EVERYWHERE: 0.84 mg/dL (ref <=1.30)
ESTIMATED GFR CARE EVERYWHERE: 105 mL/min/BSA (ref >=60)

## 2018-02-24 LAB — ABO/RH CARE EVERYWHERE: ABO/RH CARE EVERYWHERE: A POS

## 2018-02-24 LAB — INR CARE EVERYWHERE: INR CARE EVERYWHERE: 1 (ref 0.8–1.2)

## 2018-03-06 DIAGNOSIS — R079 Chest pain, unspecified: Secondary | ICD-10-CM

## 2018-03-06 MED ORDER — ACETAMINOPHEN 1,000 MG/100 ML (10 MG/ML) INTRAVENOUS SOLUTION
1,000 mg/100 mL (10 mg/mL) | INTRAVENOUS | Status: AC
  Administered 2018-03-07: 04:00:00 via INTRAVENOUS

## 2018-03-06 MED ORDER — SODIUM CHLORIDE 0.9 % (FLUSH) INJECTION SYRINGE: 0.9 % | INTRAMUSCULAR | Status: DC | PRN

## 2018-03-06 NOTE — ED Provider Notes (Signed)
ED First Attending       History     Chief Complaint   Patient presents with    Chest Pain     Patient c/o 6/10 chest pain; states his chest feels tight; feels similar to MI in '13.  Recent CVA Nov. 10 with tPA administration.       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   Reports that on Thursday (three days ago, had) chest pain and syncope, was evaluated in ED and "everything was fine"   [place] had acute onset of left sided chest CP, dizziness. He grabbed for wall and slipped down.He denies LOC, but had ringing in ears, vision went dark. 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    No past surgical history on file.    Social History     Socioeconomic History    Marital status: Divorced     Spouse name: Not on file    Number of children: Not on file    Years of education: Not on file    Highest education level: Not on file   Occupational History    Not on file   Social Needs    Financial resource strain: Not on file    Food insecurity:     Worry: Not on file     Inability: Not on file    Transportation needs:     Medical: Not on file     Non-medical: Not on file   Tobacco Use    Smoking status: Not on file   Substance and Sexual  Activity    Alcohol use: Not on file    Drug use: Not on file    Sexual activity: Not on file   Lifestyle    Physical activity:     Days per week: Not on file     Minutes per session: Not on file    Stress: Not on file   Relationships    Social connections:     Talks on phone: Not on file     Gets together: Not on file     Attends religious service: Not on file     Active member of club or organization: Not on file     Attends meetings of clubs or organizations: Not on file     Relationship status: Not on file    Intimate partner violence:     Fear of current or ex partner: Not on file     Emotionally abused:  Not on file     Physically abused: Not on file     Forced sexual activity: Not on file   Other Topics Concern    Not on file   Social History Narrative    Not on file       No family history on file.    History, Medications and Nursing Notes were reviewed by me  Review of Systems     Review of Systems  GEN: No fevers or chills  HENT: No congestion or rhinorrhea  EYES No eye discharge or redness, + blurry vision  CV: + chest pain, + syncope  PULM: No cough, no shortness of breath  GI: No nausea or vomiting, no diarrhea  GU: No dysuria or hematuria  MSK: No neck pain, no falls  NEURO: + headaches, no seizures, no weakness  SKIN: No rashes or wounds    Pertinent positives as noted above, all other systems reviewed and otherwise negative.    Physical Exam   Triage Vital Signs:  BP: 111/68, Pulse - Palpated/Pleth: 85, Temp: 37 C (98.6 F), *Resp: 16, SpO2: 97 %    Physical Exam   Triage vitals reviewed:  GEN: Able to participate in interview, well nourished, well appearing, no acute distress  HENT: Normocephalic, atraumatic, mucous membranes moist, oropharynx clear  EYES: EOMI, PERRLA, sclera anicteric  NECK: Supple, normal ROM  CV: Normal rate, regular rhythm, no murmurs/rubs/gallops  PULM: Normal work of breathing, clear breath sounds bilaterally, no wheezes/rales/rhonchi  GI: Abdomen soft, nontender,  nondistended, no guarding, no palpable masses  GU: Deferred  BACK: No CVA tenderness, no midline bony tenderness  EXT: No LE edema appreciated, distal pulses intact and symmetric  SKIN: Warm, dry, no rashes, no swelling or erythema  NEURO: AOx4, CN2-12 intact, 4/5 strength LUE, intact to LT in UEs/LEs bilaterally, stable gait, coordination grossly intact, slow FNF LUE, normal RUE, mild RUE pronator drift      Interpretations:  Lab, Imaging, EKG & Rhythm Strip     Labs Reviewed   Complete Blood Count with Differential   Result Value Ref Range    WBC Count 6.7 3.4 - 10.0 x10E9/L    RBC Count 4.73 4.4 - 5.9 x10E12/L    Hemoglobin 10.9 (L) 13.6 - 17.5 g/dL    Hematocrit 34.9 (L) 41 - 53 %    MCV 74 (L) 80 - 100 fL    MCH 23.0 (L) 26 - 34 pg    MCHC 31.2 31 - 36 g/dL    Platelet Count 317 140 - 450 x10E9/L    Neutrophil Absolute Count 4.57 1.8 - 6.8 x10E9/L    Lymphocyte Abs Cnt 1.18 1.0 - 3.4 x10E9/L    Monocyte Abs Count 0.74 0.2 - 0.8 x10E9/L    Eosinophil Abs Ct 0.12 0.0 - 0.4 x10E9/L    Basophil Abs Count 0.02 0.0 - 0.1 x10E9/L    Imm Gran, Left Shift 0.02 <0.1 x10E9/L   Prothrombin Time w/INR   Result Value Ref Range    PT 13.6 11.7 - 15.1 s    Int'l Normaliz Ratio 1.1 0.9 - 1.2   Troponin I   Result Value Ref Range    Troponin I <0.02 <0.05 ug/L   B-Type Natriuretic Peptide   Result Value Ref Range    B-Type Natriuretic Peptide 10 <40 pg/mL   Comprehensive Metabolic Panel (BMP, AST, ALT, T.BILI, ALKP, TP, ALB)   Result Value Ref Range    Albumin, Serum / Plasma 4.1 3.5 -  4.8 g/dL    Alkaline Phosphatase 76 31 - 95 U/L    Alanine transaminase 19 12 - 60 U/L    Aspartate transaminase 19 17 - 42 U/L    Bilirubin, Total 0.3 0.2 - 1.3 mg/dL    Urea Nitrogen, Serum / Plasma 16 6 - 22 mg/dL    Calcium, total, Serum / Plasma 8.8 8.8 - 10.3 mg/dL    Chloride, Serum / Plasma 106 97 - 108 mmol/L    Creatinine 0.71 0.61 - 1.24 mg/dL    eGFR if non-African American 113 >60 mL/min    eGFR if African Amer >120 >60 mL/min     Potassium, Serum / Plasma 3.7 (L) 3.8 - 5.1 mmol/L    Sodium, Serum / Plasma 138 135 - 145 mmol/L    Protein, Total, Serum / Plasma 6.7 6.0 - 8.4 g/dL    Carbon Dioxide, Total 22 22 - 32 mmol/L    Anion Gap 10 4 - 14    Glucose, non-fasting 84 70 - 199 mg/dL   Lipase   Result Value Ref Range    Lipase 40 19 - 56 U/L   Troponin I   Result Value Ref Range    Troponin I <0.02 <0.05 ug/L         X-ray chest PA and lateral   Preliminary Result   FINDINGS/IMPRESSION:      Clear lungs. No pleural effusion or pneumothorax.      Cardiomediastinal silhouette is unremarkable.            ED Course (Document differential diagnosis, ED treatment, response to treatment, reasons for choice of disposition, and whether further outpatient workup is needed or if patient is going to OR or ICU)              Reassessment       Coding and Billing Info     MDM   47 y/o M with frequently ED visits presenting with chest pain, syncope and headache. EKG without ST elevation and troponin negative with three hour delta.     Low suspicion for acute aortic syndrome given symptoms non-maximal at onset, non-tearing pain, does not radiate to neck/extremity/abdomen and patient with an unremarkable exam including no new murmurs. Mediastinum normal caliber on CXR. Patient has stable left sided weakness and CTA WNL with same deficits on 02/25/18 per OSH documentation.    Doubt pulmonary embolism (no history of previous blood clot, no signs or symptoms of DVT, normal oxygenation, no respiratory distress or evidence of shock) pneumothorax (symmetric breath sounds, trachea midline), pneumonia (no productive cough or fever), or esophageal perforation (no history of forceful vomiting or Valsalva). Recent negative CTPE.     Doubt intracranial hemorrhage, meningitis given no meningismus, no fever, no worsening of symptoms during ED observation.                    Nevin Bloodgood, MD  Resident  03/07/18 [phone]       Lendon Ka. Whetstone, MD  03/07/18 1140

## 2018-03-07 ENCOUNTER — Emergency Department: Admit: 2018-03-07 | Discharge: 2018-03-07 | Payer: MEDICAID

## 2018-03-07 LAB — CMP (EXT)
ALT/SGPT (EXT): 19 U/L (ref 12–60)
AST/SGOT (EXT): 19 U/L (ref 17–42)
Albumin (EXT): 4.1 g/dL (ref 3.5–4.8)
Alkaline Phosphatase (EXT): 76 U/L (ref 31–95)
Anion Gap (EXT): 10 (ref 4–14)
BUN (EXT): 16 mg/dL (ref 6–22)
Bilirubin, Total (EXT): 0.3 mg/dL (ref 0.2–1.3)
CO2 (EXT): 22 mmol/L (ref 22–32)
CalciumCalcium (EXT): 8.8 mg/dL (ref 8.8–10.3)
Chloride (EXT): 106 mmol/L (ref 97–108)
Creatinine (EXT): 0.71 mg/dL (ref 0.61–1.24)
Glucose (EXT): 84 mg/dL (ref 70–199)
Potassium (EXT): 3.7 mmol/L — ABNORMAL LOW (ref 3.8–5.1)
Protein (EXT): 6.7 g/dL (ref 6.0–8.4)
Sodium (EXT): 138 mmol/L (ref 135–145)
eGFR - Creat MDRD (EXT): 113 mL/min (ref >60)
eGFR - Creat MDRD (EXT): >120 mL/min (ref >60)

## 2018-03-07 LAB — COMPREHENSIVE METABOLIC PANEL
AST: 19 U/L (ref 17–42)
Alanine transaminase: 19 U/L (ref 12–60)
Albumin, Serum / Plasma: 4.1 g/dL (ref 3.5–4.8)
Alkaline Phosphatase: 76 U/L (ref 31–95)
Anion Gap: 10 (ref 4–14)
Bilirubin, Total: 0.3 mg/dL (ref 0.2–1.3)
Calcium, total, Serum / Plasma: 8.8 mg/dL (ref 8.8–10.3)
Carbon Dioxide, Total: 22 mmol/L (ref 22–32)
Chloride, Serum / Plasma: 106 mmol/L (ref 97–108)
Creatinine: 0.71 mg/dL (ref 0.61–1.24)
Glucose, non-fasting: 84 mg/dL (ref 70–199)
Potassium, Serum / Plasma: 3.7 mmol/L — ABNORMAL LOW (ref 3.8–5.1)
Protein, Total, Serum / Plasma: 6.7 g/dL (ref 6.0–8.4)
Sodium, Serum / Plasma: 138 mmol/L (ref 135–145)
Urea Nitrogen, Serum / Plasma: 16 mg/dL (ref 6–22)
eGFR - high estimate: >120 mL/min (ref >60)
eGFR - low estimate: 113 mL/min (ref >60)

## 2018-03-07 LAB — B-TYPE NATRIURETIC PEPTIDE: BNP: 10 pg/mL (ref <40)

## 2018-03-07 LAB — COMPLETE BLOOD COUNT WITH DIFF
Abs Basophils: 0.02 10*9/L (ref 0.0–0.1)
Abs Eosinophils: 0.12 10*9/L (ref 0.0–0.4)
Abs Imm Granulocytes: 0.02 10*9/L (ref <0.1)
Abs Lymphocytes: 1.18 10*9/L (ref 1.0–3.4)
Abs Monocytes: 0.74 10*9/L (ref 0.2–0.8)
Abs Neutrophils: 4.57 10*9/L (ref 1.8–6.8)
Hematocrit: 34.9 % — ABNORMAL LOW (ref 41–53)
Hemoglobin: 10.9 g/dL — ABNORMAL LOW (ref 13.6–17.5)
MCH: 23 pg — ABNORMAL LOW (ref 26–34)
MCHC: 31.2 g/dL (ref 31–36)
MCV: 74 fL — ABNORMAL LOW (ref 80–100)
Platelet Count: 317 10*9/L (ref 140–450)
RBC Count: 4.73 10*12/L (ref 4.4–5.9)
WBC Count: 6.7 10*9/L (ref 3.4–10.0)

## 2018-03-07 LAB — PROTHROMBIN TIME
Int'l Normaliz Ratio: 1.1 (ref 0.9–1.2)
PT: 13.6 s (ref 11.7–15.1)

## 2018-03-07 LAB — LIPASE: Lipase: 40 U/L (ref 19–56)

## 2018-03-07 LAB — ED INFORMATION EXCHANGE ORDER: EDIE Other: 1

## 2018-03-07 LAB — TROPONIN I
Troponin I: <0.02 ug/L (ref <0.05)
Troponin I: <0.02 ug/L (ref <0.05)

## 2018-03-12 LAB — ECG 12 LEAD UNIT PERFORMED
Atrial Rate: 77 {beats}/min
Calculated P Axis: 48 degrees
Calculated R Axis: 49 degrees
Calculated T Axis: 34 degrees
P-R Interval: 136 ms
QRS Duration: 102 ms
QT Interval: 370 ms
QTcb: 418 ms
Ventricular Rate: 77 {beats}/min

## 2018-03-29 LAB — PROTHROMBIN TIME CARE EVERYWHERE
INR CARE EVERYWHERE: 1 Ratio
PROTHROMBIN TIME CARE EVERYWHERE: 12.9 s (ref 11.7–14.3)

## 2018-04-09 LAB — PROTHROMBIN TIME CARE EVERYWHERE
INR CARE EVERYWHERE: 1.1 (ref 0.9–1.1)
PROTHROMBIN TIME CARE EVERYWHERE: 10.6 s (ref 8.9–11.2)

## 2018-04-15 LAB — CMP (EXT)
A/G Ratio (EXT): 1.3 (ref >1.0)
ALT/SGPT (EXT): 18 IU/L (ref 6–60)
AST/SGOT (EXT)T: 24 IU/L (ref 5–40)
Albumin (EXT): 4 g/dL (ref 3.2–5.0)
Alkaline Phosphatase (EXT): 84 IU/L (ref 38–126)
Anion Gap (EXT): 11 (ref 7–15)
BUN (EXT): 20 mg/dL (ref 8–24)
Bilirubin, Total (EXT): 0.2 mg/dL (ref 0.2–1.4)
CO2 (EXT): 20 mmol/L — ABNORMAL LOW (ref 21–28)
CalciumCalcium (EXT): 8.8 mg/dL (ref 8.5–10.5)
Chloride (EXT): 107 mmol/L (ref 98–109)
Creatinine (EXT): 0.89 mg/dL (ref 0.7–1.5)
Globulin (EXT): 3.1 g/dL (ref 2.0–4.5)
Glucose (EXT): 102 mg/dL (ref 65–120)
Potassium (EXT): 4.1 mmol/L (ref 3.6–5.3)
Protein (EXT): 7.1 g/dL (ref 6.2–8.0)
Sodium (EXT): 138 mmol/L (ref 135–145)
eGFR - Creat MDRD (EXT): 111 mL/min (ref >59)
eGFR - Creat MDRD (EXT): 92 mL/min (ref >59)

## 2018-05-09 LAB — HEMOGLOBIN A1C - EXTERNAL
Hemoglobin A1C: 5.4 % — NL (ref 0.2–7.9)
Mean Glucose: <110 — NL

## 2018-06-12 LAB — CMP (EXT)
A/G Ratio (EXT): 1.2 (ref 1.0–2.0)
ALT/SGPT (EXT): 28 U/L (ref 6–52)
AST/SGOT (EXT): 35 U/L (ref <=42)
Albumin (EXT): 3.9 g/dL (ref 3.5–5.0)
Alkaline Phosphatase (EXT): 99 U/L (ref 35–125)
Anion Gap (EXT): 13 (ref 10–17)
BUN (EXT): 16 mg/dL (ref 5–27)
BUN/CREAT Ratio (EXT): 21 — ABNORMAL HIGH (ref 8–20)
Bilirubin, Total (EXT): 0.3 mg/dL (ref <=1.2)
CO2 (EXT): 20 mmol/L — ABNORMAL LOW (ref 21–31)
CalciumCalcium (EXT): 8.9 mg/dL (ref 8.4–10.2)
Chloride (EXT): 113 mmol/L — ABNORMAL HIGH (ref 98–108)
Creatinine (EXT): 0.76 mg/dL (ref 0.70–1.30)
Glucose (EXT): 104 mg/dL — ABNORMAL HIGH (ref 65–99)
Potassium (EXT): 5 mmol/L (ref 3.5–5.2)
Protein (EXT): 7.2 g/dL (ref 6.0–8.5)
Sodium (EXT): 141 mmol/L (ref 136–145)
eGFR - Creat MDRD (EXT): >60 mL/min/{1.73_m2} (ref >=60)
eGFR - Creat MDRD (EXT): >60 mL/min/{1.73_m2} (ref >=60)

## 2018-06-12 LAB — PROTHROMBIN TIME CARE EVERYWHERE
INR CARE EVERYWHERE: 0.93 — ABNORMAL LOW (ref 2.00–3.00)
PROTHROMBIN TIME CARE EVERYWHERE: 12.9 s (ref 12.4–14.7)

## 2018-06-12 LAB — APTT CARE EVERYWHERE: APTT CARE EVERYWHERE: 20.3 s — ABNORMAL LOW (ref 23.5–37.7)

## 2018-09-15 LAB — LAB EXTERNAL RESULT UNMAPPED: COVID-19 CARE EVERYWHERE: NOT DETECTED

## 2018-10-16 LAB — CREATININE CARE EVERYWHERE
CREATININE CARE EVERYWHERE: 0.94 mg/dL (ref <=1.30)
ESTIMATED GFR CARE EVERYWHERE: 97 mL/min/BSA (ref >=60)

## 2018-10-18 ENCOUNTER — Inpatient Hospital Stay
Admit: 2018-10-18 | Discharge: 2018-10-19 | Disposition: A | Discharge status: Home / Self Care | Payer: MEDICAID | Source: Other Acute Inpatient Hospital

## 2018-10-18 DIAGNOSIS — R079 Chest pain, unspecified: Secondary | ICD-10-CM

## 2018-10-18 LAB — Differential Automated: NEUTROPHIL PERCENT, AUTO: 68.1 (ref 1.80–6.90)

## 2018-10-18 LAB — APTT: APTT: 30.5 s (ref 24.4–36.2)

## 2018-10-18 LAB — CBC: ABSOLUTE NUCLEATED RBC COUNT: 0 10*3/uL (ref 0.00–0.00)

## 2018-10-18 LAB — Prothrombin Time Panel: PROTHROMBIN TIME: 14 s (ref 11.5–14.4)

## 2018-10-18 NOTE — Consults
SW seeing pt due to multiple ED visits ([facility]) and possible homelessness. Met with pt at bedside, introduced myself, pt asking ''SW? Why do I need to see a Education officer, museum?''. Informed pt that I was checking in, as questionable homeless, and also pt with multiple ED visits.  Pt denies being homeless, states he lives in [place], was up here to see friend but was planning to return home, when he started having sxs. 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     Pt denies any SW needs at this time. Pt confirms his address and phone #, but does not have any emergency contacts he wants to list. Pt states he's taking Lipitor, Lisinipril, says he's compliant with his meds.  Per Care Everywhere, pt has been seen at various EDs around the country, [place], [place], [place], [place], even [place] in past 2 months. (15 ED visits in past 2 months).    Pt with hx of leaving AMA at various sites, also with hx of CVA related complaints, but questionable if any true CVA?     Pt declines need for resources at this time.  SW to remain available as needed.

## 2018-10-18 NOTE — ED Provider Notes
[facility]  Emergency Department Service Report    [name] [name] 48 y.o. male , presents with Chest Pain      Triage   Arrived on 10/18/2018 at 3:54 PM   Arrived by SM 1 [67]    ED Triage Vitals   Temp Temp Source BP Heart Rate Resp SpO2 O2 Device Pain Score Weight   10/18/18 1555 10/18/18 1555 10/18/18 1555 10/18/18 1555 10/18/18 1555 10/18/18 1555 10/18/18 1555 10/18/18 1558 10/18/18 1556   37.1 ???C (98.7 ???F) Oral 103/63 96 16 99 % None (Room air) Five 86.2 kg (190 lb)       Pre hospital care:  Prehospital IV Access  Pre-Arrival IV Access: Yes  Interventions  Prehospital 12 Lead EKG Reading: NSR  Medications: Nitroglycerin, Aspirin, Zofran    Allergies   Allergen Reactions   ??? Ketorolac Tromethamine    ??? Tramadol    ??? Plastic Tape Rash       History   Patient is a 48 y.o. male with hx of heart attack, HLD, HTN, stroke, TIA who presents to the ED by EMS with complaint of sudden onset chest pain beginning earlier today. Pain is moderate, constant, unchanged, with no alleviating factors. Pt further reports he had blurry vision as well. Upon EMS arrival, EKG NSR, pt was given NTG 0.4 mg (SL) x3, ASA 324 mg (PO) & ondansetron 4 mg (IVP). Pt denies SOB or other complaints. Pt has numerous ED visits and has been known to fabricate stories in the past. Pt is homeless.     The history is provided by the patient. No language interpreter was used.   Chest Pain   The chest pain began 3 - 5 hours ago. Chest pain occurs constantly. The chest pain is unchanged. The severity of the chest pain is moderate. The pain does not radiate. Pertinent negatives for primary symptoms include no shortness of breath.              Past Medical History:   Diagnosis Date   ??? Heart attack (HCC/RAF)    ??? Hyperlipidemia    ??? Hypertension    ??? Stroke (HCC/RAF)    ??? TIA (transient ischemic attack)         Past Surgical History:   Procedure Laterality Date   ??? BACK SURGERY     ??? CRANIOPLASTY FOR CRANIAL DEFECT ??? KNEE JOINT MANIPULATION          Past Family History   Family history reviewed by me and there is no pertinent past family history related to the patient's current case and/or care.             Past Social History   he reports that he has been smoking cigarettes. He has been smoking about 0.06 packs per day. He has never used smokeless tobacco. He reports current drug use. Drug: Marijuana. He reports previously being sexually active. He reports that he does not drink alcohol.     Review of Systems   Eyes: Positive for visual disturbance.   Respiratory: Negative for shortness of breath.    Cardiovascular: Positive for chest pain.   All other systems reviewed and are negative.      Physical Exam   Physical Exam  Vitals signs and nursing note reviewed.   Constitutional:       General: He is not in acute distress.     Appearance: He is well-developed.   HENT:      Head:  Normocephalic and atraumatic.   Eyes:      Conjunctiva/sclera: Conjunctivae normal.   Neck:      Musculoskeletal: Normal range of motion and neck supple.   Cardiovascular:      Rate and Rhythm: Normal rate and regular rhythm.   Pulmonary:      Effort: Pulmonary effort is normal. No respiratory distress.      Breath sounds: Normal breath sounds.   Abdominal:      Palpations: Abdomen is soft.      Tenderness: There is no abdominal tenderness. There is no guarding or rebound.   Musculoskeletal: Normal range of motion.   Lymphadenopathy:      Cervical: No cervical adenopathy.   Skin:     General: Skin is warm and dry.   Neurological:      Mental Status: He is alert.      Sensory: No sensory deficit.      Comments: Moving all four extremities.   Psychiatric:         Behavior: Behavior normal.         ED Course          Laboratory Results     Labs Reviewed   BASIC METABOLIC PANEL - Abnormal; Notable for the following components:       Result Value    Glucose 107 (*)     All other components within normal limits CBC (PERFORMABLE) - Abnormal; Notable for the following components:    Hemoglobin 10.9 (*)     Hematocrit 35.2 (*)     Mean Corpuscular Volume 77.4 (*)     Mean Corpuscular Hemoglobin 24.0 (*)     MCH Concentration 31.0 (*)     Red Cell Distribution Width-SD 53.5 (*)     Red Cell Distribution Width-CV 19.0 (*)     All other components within normal limits   DIFFERENTIAL, AUTOMATED (PERFORMABLE) - Abnormal; Notable for the following components:    Absolute Lymphocyte Count 1.10 (*)     All other components within normal limits   CK, TOTAL - Normal   PROTHROMBIN TIME PANEL - Normal   APTT - Normal   CBC & AUTO DIFFERENTIAL    Narrative:     The following orders were created for panel order CBC & Plt & Diff.  Procedure                               Abnormality         Status                     ---------                               -----------         ------                     NWG[[id number]]                          Abnormal            Final result               Differential, Automated[[id number]]      Abnormal            Final result  Please view results for these tests on the individual orders.   TROPONIN I       Imaging Results     No orders to display       Administered Medications     Medication Administration from 10/18/2018 1554 to 10/18/2018 1730     None          Procedures   ED ECG interpretation  Date/Time: 10/18/2018 4:14 PM  Performed by: Lelon Mast., MD  Authorized by: Lelon Mast., MD     ECG reviewed by ED Physician in the absence of a cardiologist: yes    Interpretation:     Interpretation: normal    Rate:     ECG rate:  79    ECG rate assessment: normal    Rhythm:     Rhythm: sinus rhythm    QRS:     QRS axis:  Normal  ST segments:     ST segments:  Normal        MDM  ED Course:  Nursing note reviewed.  Previous medical records were obtained and reviewed by myself. They reveal a history of heart attack, HLD, HTN, stroke, TIA. Pt has numerous ER visits for chest pain and neurological complaints. Pt has been seen at numerous facilities throughout the [address] including [place], [place], [place], [place], [place], and at least 30 facilities in [place]. Pt has been seen in this ER at least 4 times in the last 2 years. Most visits were either for chest pain or neurological complaints. Received TPA on multiple occasions. Pt known to fabricate stroke symptoms.  EMS run sheet obtained and reviewed by myself.     4:28 PM I visited the patient to obtain history and perform the physical exam. Orders placed for labs.      Laboratory Results (as interpreted by Lelon Mast., MD):  Lab workup is negative.      Medical Decision Making:  [name] [name] presents to the ED today with chief complaint of chest pain.  On exam, the patient is normal.      Given the patient's physical exam, laboratory analysis, and imaging studies, my impression is the patient is experiencing an episode of chest pain, unspecified type.   Therefore, I feel comfortable discharging the patient home with close outpatient follow up.    Discharge Instructions:  I advised the patient to follow up with their primary care physician on an outpatient basis over the next 24-48 hours, and recommended prompt  return to the emergency department if they have additional concerns or note worsening symptoms.  The patient indicates understanding of these issues and agrees with the plan.  A copy of the ED  workup results was provided to the patient as well.    Patient is alert and oriented and has capacity to make decisions and can be safely discharged. Social worker was consulted. We discussed patients condition and appropriate resources were provided to patient.           Clinical Impression     1. Chest pain, unspecified type        Prescriptions     New Prescriptions    No medications on file       Disposition and Follow-up   Disposition: Discharge [1]    No future appointments.    Follow up with:  Pcp, No, MD    In 3 days [facility] Emergency Department  [address]  [place] [address]  [phone]    As needed, If symptoms worsen      Return precautions are specified on After Visit Summary.    The documentation on this chart was performed by Burman Nieves, scribed for Lelon Mast., MD    10/18/2018 4:35 PM     All scribe entries and documentation made by the scribe were entered at my direction.  I have reviewed this medical record and agree to the accuracy and completeness of the content entered by the scribe.  The documentation recorded by the scribe accurately reflects the service I personally performed and the decisions made by me.    Lelon Mast., MD 10:34 PM 10/18/2018                   Lelon Mast., MD  10/18/18 2234

## 2018-10-18 NOTE — ED Notes
COLLECTIVE?NOTIFICATION?10/18/2018 15:54?[name] [name], [name] [name]?MRN: [id number]    [name]'s patient encounter information:   AVW:?[id number]  Account [id number]  Billing Account [id number]      Criteria Met      6 Visits in 180 Days    2 Visits in 30 Days    Care Recommendation    Security and Safety  No recent Security Events currently on file    ED Care Guidelines from [facility]. [facility]  Last Updated: 07/28/17 6:46 PM      Care Recommendation:    WARNING/SAFETY ALERT:? *****STROKE SYMPTOMS ? USE CAUTION WHEN CONSIDERING tPA. [facility] WARNING ''PATIENT HAS MORE THAN 20 ADMISSIONS FOR LEFT SIDED WEAKNESS, RECEIVED tPA MORE THAN 10 TIMES.''? 01/21/[facility]    Personal history of TIA and cerebral infarction without residual deficits ? MRI 05/31/2017 ''stable MRI scan of the brain without evidence of acute intracranial abnormality.''? CTA 05/30/2017 ''stable intracranial contents. Negative for acute intracranial abnormality''.      EXTENSIVE ED VISIT HISTORY:? [address] different [address], 6 [place], 6 [place], 7 [place], 1 [place] for multiple complaints (see below).    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    PATIENT IDENTIFICATION:? [place] Identification Card    ASSOCIATED CONCERNS/ASSESSMENT:    Radiation Exposure Risk:? Risk unknown as we are unable to identify how many CT's this patient has had across [place], [place], [place] and [place].      ?Addition CTA Results:? 07/05/2015 ? '' Normal CT angiogram without evidence for acute occlusion, stenosis, dissection, or aneurysm.''    03/01/2015 ? '' No acute intracranial abnormality on the unenhanced portion of the study.'' Recommendation:? Review previous testing: frequent ER visits can result in fragmented primary and specialty care.    KEY MEDICAL CONTACTS: No PCP      Insurance: Multiple Insurances listed: [employer] [place], [place] Medicaid, Medi-Cal, [address], Self Pay, and numerous other insurance providers.  These are guidelines and the Yeilin Zweber should exercise clinical judgment when providing care.    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  drug seeking behaviors + drug screen for THC and opiates.   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            E.D. Visit Count (12 mo.)  Facility Visits   [facility] 2   [facility] 2   [facility] 1   [place] 1   [facility] 2   [facility] 1   [facility] 1   [facility] 1   [facility] 1  [facility] 1   [facility] 2   [facility] 1   [facility] 1   [facility] 1   [facility] 1   [facility] 1   [facility] 1   [facility] 1   [facility] [place] [facility] 3   [facility] 1   [facility] 1   [facility] 1 [facility] 1   [facility] 1   [facility] 1   [facility] 3   [facility] 1   [facility] 2   [facility] 1   [facility] 1   [facility] 1   [facility] [place] 3   [facility] 2   [facility] 2   [address] - [place] of the [facility] 3   [facility] 1   [place] 1   [facility] 1   [facility] 6   [facility] 1   [facility] [place] 1   Total 61   Note: Visits indicate total known visits.      Recent Emergency Department Visit Summary  Showing 10 most recent visits out of 61 in the past 12 months  Date Facility [facility] Type Diagnoses or Chief Complaint   Oct 18, 2018 [facility] [place] [place] Emergency     Oct 08, 2018 [place] [facility] [place] [place] Emergency      1. Chest pain, unspecified      2. Jaw pain      3. Old myocardial infarction      4. Essential (primary) hypertension      5. Personal history of transient ischemic attack (TIA), and cere      6. Other nonmedicinal substance allergy status      7. Allergy status to narcotic agent status      Oct 02, 2018 [facility] [place] [place] Emergency  Chief Complaint: CP    Sep 25, 2018 [facility] [place] [place] Emergency     Sep 23, 2018 [facility] [place] Emergency  Chief Complaint: SI    Sep 21, 2018 [facility] - ER FACILITY [facility] [place]. [place] Emergency      Cerebral infarction, unspecified      Sep 20, 2018 [facility] ER FACILITY [facility] [place] [place] Emergency      Suicidal ideations      Sep 17, 2018 [facility] Clin [facility] [place] Emergency      Dysarthria and anarthria      Syncope and collapse      Tobacco use      Dehydration      Iron deficiency anemia, unspecified Chest pain, unspecified      Atherosclerotic heart disease of native coronary artery witho      Weakness      Essential (primary) hypertension      Hemiplegia, unspecified affecting unspecified side      Sep 14, 2018 [facility] Dept [facility] [place] Emergency      Chest pain,  unspecified      Syncope and collapse      Sep 07, 2018 [facility] [place] Emergency      1. Chest pain, unspecified      2. Chest pain      2. Personal history of other diseases of the circulatory system      3. Personal history of transient ischemic attack (TIA), and cere      4. Other psychoactive substance abuse, in remission      5. left chest pain          Recent Inpatient Visit Summary  Showing 10 most recent visits out of 15 in the past 12 months  Date Facility [facility] Type Diagnoses or Chief Complaint   Sep 21, 2018 [facility] ([facility]D [facility] [place] Inpatient      Weakness      Essential (primary) hypertension      Bipolar disorder, unspecified      Anesthesia of skin      Atherosclerotic heart disease of native coronary artery witho      Status post administration of tPA (rtPA) in a different facil      Sep 17, 2018 [facility] ([facility] [place] Inpatient     Mar 23, 2018 [facility] [place] Tele[facility]      1. Weakness      3. STROKE      Mar 15, 2018 [facility] [place] Medical Surgical      1. Cerebral infarction, unspecified      1. Malingerer [conscious simulation]      2. Personal history of transient ischemic attack (TIA), and cere      3. Hypertensive heart disease with heart failure      4. Atherosclerotic heart disease of native coronary artery witho      5. Heart failure, unspecified      6. Old myocardial infarction      7. Hyperlipidemia, unspecified      8. Anxiety disorder, unspecified      9. Bipolar disorder, unspecified      Feb 15, [facility] [place] [facility] 0. FACIAL DROOP LEFT SIDE WEAK HEADACHE CVA      1. Headache      2. Essential (primary) hypertension      2. Family history of ischemic heart disease and other diseases o      2. Other nonmedicinal substance allergy status      2. Pure hypercholesterolemia, unspecified      2. Personal history of transient ischemic attack (TIA), and cere      2. Schizoaffective disorder, unspecified      2. Cannabis use, unspecified, uncomplicated      2. Bipolar disorder, unspecified      Jan 27, 2018 [facility]      1. Cerebral infarction, unspecified      1. Weakness      2. Slurred speech      3. Anesthesia of skin      4. Nicotine dependence, cigarettes, uncomplicated      5. Essential (primary) hypertension      6. Major depressive disorder, single episode, unspecified      7. Schizophrenia, unspecified      8. Hyperlipidemia, unspecified      9. Atherosclerotic heart disease of native coronary artery witho      Jan 13, 2018 [address] H. - [place] Inpatient      Transient cerebral ischemic attack, unspecified      Cannabis dependence, uncomplicated      Hyperlipidemia, unspecified      Other disorders of autonomic nervous system      Acute ischemic heart disease, unspecified      Unspecified mood [affective] disorder      Essential (primary) hypertension      NIHSS score 0      Old myocardial infarction      Nicotine dependence, cigarettes, uncomplicated      Dec 19, 2017 [facility] ([facility]D Exch[facility] [place] Inpatient      Other chest pain      Syncope and collapse      Chest pain, unspecified      Dec 09, 2017 [facility]      0. CHEST PAIN      Dec 01, 2017 [facility] [place] Medical Surgical      1. Chest pain, unspecified      1. Precordial pain      2. Atherosclerotic heart disease of native coronary artery witho      3. Essential (primary) hypertension 4. Hyperlipidemia, unspecified      5. Generalized anxiety disorder      6. Major depressive disorder, single episode, unspecified      7. Post-traumatic stress disorder, unspecified      8. Nicotine dependence, other tobacco product, uncomplicated      9. Old myocardial infarction          Care Team  Rebekah Zackery Specialty Phone Fax Service Dates   Buzzy Han , MD Family Medicine [phone]  Apr 17, 2018 - Current    [facility], JUSTIN , D.O. Internal Medicine   Current    Nelida Meuse, MA, LPC, NCC Counselor: Professional [phone] Current    Madalyn Rob, LMFTA Marriage and Family Therapist [phone]  Current    Dorann Lodge LMFT Marriage and Family Therapist [phone] Current    Buzzy Han Primary Care  [phone] Apr 17, 2018 - Current    DOCTOR MISC Primary Care   Current    [facility] Primary Care   Current    BOCK,CINDY L Case or Care Manager   Current    Jaynie Crumble Primary Care   Current    Ella Jubilee Primary Care [phone] Current    [facility] Nurse[facility]ise Primary Care [phone]  Current    Dimitri Ped Primary Care [phone] Feb 26, 2016 - Current    Colvin Caroli Primary Care   Current    [facility] Primary Care   Current    [facility] Primary Care   Current    PCP 0O Primary Care   Current    [facility] Primary Care   Current    [facility] EPIC ADMINISTRATION Primary Care   Current    [facility] Primary Care  [phone] Current      Collective Portal  This patient has registered at the [facility] Emergency Department   For more information visit: [email or web address]   PLEASE NOTE:     1.   Any care recommendations and other clinical information are provided as guidelines or for historical purposes  only, and providers should exercise their own clinical judgment when providing care.    2.   You may only use this information for purposes of treatment, payment or health care operations activities, and subject to the limitations of applicable [employer] Polic[employer].    3.   You should consult directly with the organization that provided a care guideline or other clinical history with any questions about additional information or accuracy or completeness of information provided.    ? 2020 [employer] - https://craig.com/

## 2018-10-19 LAB — Troponin I: TROPONIN INTERPRETATION: NEGATIVE ng/mL (ref <0.1)

## 2018-10-19 LAB — Basic Metabolic Panel
GFR ESTIMATE FOR AFRICAN AMERICAN: >89 mL/min/{1.73_m2} (ref 8–19)
GFR ESTIMATE FOR NON-AFRICAN AMERICAN: >89 mL/min/{1.73_m2} (ref 7–22)

## 2018-10-19 LAB — CK, Total: CREATINE PHOSPHOKINASE, TOTAL: 164 U/L (ref 63–473)

## 2018-10-19 NOTE — ED Notes
Pt updated on plan of care (awaiting lab results and MD review). Pt denies pain or any discomfort at this time. Pt on continuous pulse ox and cardiac monitor. Will continue to monitor.

## 2018-11-15 LAB — LIPID PROFILE (EXT)
Chol/HDL Ratio (EXT): 3.5
Cholesterol (EXT): 124 mg/dL (ref <200)
HDL Cholesterol (EXT): 35 mg/dL — ABNORMAL LOW (ref >39)
LDL Cholesterol, CALC (EXT): 69 mg/dL (ref <130)
LDL/HDL Ratio (EXT): 2
Triglycerides (EXT): 102 mg/dL (ref <150)
VLDL Cholesterol (EXT): 20 mg/dL (ref 10–40)

## 2018-11-15 LAB — HEMOGLOBIN A1C: HEMOGLOBIN A1C % (INT/EXT): 5.9 % — ABNORMAL HIGH (ref <5.7)

## 2018-11-15 LAB — HEMOGLOBIN A1C CARE EVERYWHERE: HEMOGLOBIN A1C CARE EVERYWHERE: 5.9 % — ABNORMAL HIGH (ref <5.7)

## 2018-11-15 LAB — COVID-19 CARE EVERYWHERE: COVID-19 CARE EVERYWHERE: NOT DETECTED

## 2018-11-15 LAB — HEMOGLOBIN A1C - EXTERNAL: Hemoglobin A1C: 5.9 % — ABNORMAL HIGH (ref <5.7)

## 2018-12-29 LAB — BMP (EXT)
Anion Gap (EXT): 12 mmol/L (ref 4–15)
BUN (EXT): 17 mg/dL (ref 7–20)
CO2 (EXT): 21 mmol/L — ABNORMAL LOW (ref 22–30)
CalciumCalcium (EXT): 8.6 mg/dL (ref 8.4–10.2)
Chloride (EXT): 106 mmol/L (ref 98–109)
Creatinine (EXT): 0.74 mg/dL (ref 0.66–1.25)
Glucose (EXT): 100 mg/dL (ref 74–100)
Potassium (EXT): 3.9 mmol/L (ref 3.5–5.5)
Sodium (EXT): 139 mmol/L (ref 137–145)
eGFR - Creat MDRD (EXT): >90 mL/min/1.73 "square meters" (ref >=60)
eGFR - Creat MDRD (EXT): >90 mL/min/1.73 "square meters" (ref >=60)

## 2019-01-06 LAB — BMP (EXT)
Anion Gap (EXT): 11 mmol/L (ref 4–15)
BUN (EXT): 16 mg/dL (ref 7–20)
CO2 (EXT): 21 mmol/L — ABNORMAL LOW (ref 22–30)
CalciumCalcium (EXT): 9 mg/dL (ref 8.4–10.2)
Chloride (EXT): 107 mmol/L (ref 98–109)
Creatinine (EXT): 0.65 mg/dL — ABNORMAL LOW (ref 0.66–1.25)
Glucose (EXT): 100 mg/dL (ref 74–100)
Potassium (EXT): 4.2 mmol/L (ref 3.5–5.5)
Sodium (EXT): 139 mmol/L (ref 137–145)
eGFR - Creat MDRD (EXT): >90 mL/min/1.73 "square meters" (ref >=60)
eGFR - Creat MDRD (EXT): >90 mL/min/1.73 "square meters" (ref >=60)
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
## 2019-02-04 NOTE — ED Notes (Signed)
 Formatting of this note might be different from the original.  Pt with multiple medical complaints of CP, with bilateral vision loss with movement, ear ringing when speaking, frontal HA with inspiration and bilateral foot pain when walking. Pt states he is from out of town and unsure how to solve his issues.   Electronically signed by Alla Feeling, RN at 02/04/2019  5:32 PM CST

## 2019-02-10 LAB — CMP (EXT)
ALT/SGPT (EXT): 28 U/L (ref 16–62)
AST/SGOT (EXT): 27 U/L (ref 7–34)
Albumin (EXT): 4 g/dL (ref 3.4–5.0)
Alkaline Phosphatase (EXT): 88 U/L (ref 50–136)
Anion Gap (EXT): 4 mmol/L — ABNORMAL LOW (ref 6–16)
BUN (EXT): 10 mg/dL (ref 7–18)
BUN/CREAT Ratio (EXT): 14.3 (ref 11.7–13.9)
Bilirubin, Total (EXT): 0.2 mg/dL (ref 0.0–1.0)
CO2 (EXT): 25 mmol/L (ref 21–32)
CalciumCalcium (EXT): 8.7 mg/dL (ref 8.5–10.1)
Chloride (EXT): 107 mmol/L (ref 98–107)
Creatinine (EXT): 0.7 mg/dL (ref 0.60–1.30)
Glucose (EXT): 89 mg/dL (ref 70–110)
Potassium (EXT): 4.3 mmol/L (ref 3.5–5.0)
Protein (EXT): 7.1 g/dL (ref 6.4–8.2)
Sodium (EXT): 136 mmol/L (ref 135–145)
eGFR - Creat MDRD (EXT): >60 mL/min/{1.73_m2} (ref >=60.0)
eGFR - Creat MDRD (EXT): >60 mL/min/{1.73_m2} (ref >=60.0)

## 2019-02-24 ENCOUNTER — Inpatient Hospital Stay
Admit: 2019-02-24 | Discharge: 2019-02-25 | Disposition: A | Discharge status: Home / Self Care | Attending: Emergency Medicine

## 2019-02-24 ENCOUNTER — Emergency Department: Admit: 2019-02-24

## 2019-02-24 DIAGNOSIS — R0789 Other chest pain: Secondary | ICD-10-CM

## 2019-02-24 LAB — CBC WITH AUTO DIFFERENTIAL
Basophils %: 1 % (ref 0–1)
Basophils Absolute: 0 10*3/uL (ref 0.0–0.1)
Eosinophils %: 2 % (ref 0–7)
Eosinophils Absolute: 0.1 10*3/uL (ref 0.0–0.4)
Granulocyte Absolute Count: 0 10*3/uL (ref 0.00–0.04)
Hematocrit: 36 % — ABNORMAL LOW (ref 36.6–50.3)
Hemoglobin: 11.4 g/dL — ABNORMAL LOW (ref 12.1–17.0)
Immature Granulocytes: 0 % (ref 0.0–0.5)
Lymphocytes %: 20 % (ref 12–49)
Lymphocytes Absolute: 1.1 10*3/uL (ref 0.8–3.5)
MCH: 24.5 PG — ABNORMAL LOW (ref 26.0–34.0)
MCHC: 31.7 g/dL (ref 30.0–36.5)
MCV: 77.4 FL — ABNORMAL LOW (ref 80.0–99.0)
MPV: 9.3 FL (ref 8.9–12.9)
Monocytes %: 12 % (ref 5–13)
Monocytes Absolute: 0.7 10*3/uL (ref 0.0–1.0)
NRBC Absolute: 0 10*3/uL (ref 0.00–0.01)
Neutrophils %: 65 % (ref 32–75)
Neutrophils Absolute: 3.7 10*3/uL (ref 1.8–8.0)
Nucleated RBCs: 0 PER 100 WBC
Platelets: 351 10*3/uL (ref 150–400)
RBC: 4.65 M/uL (ref 4.10–5.70)
RDW: 18.2 % — ABNORMAL HIGH (ref 11.5–14.5)
WBC: 5.6 10*3/uL (ref 4.1–11.1)

## 2019-02-24 LAB — BASIC METABOLIC PANEL
Anion Gap: 3 mmol/L — ABNORMAL LOW (ref 5–15)
BUN: 19 MG/DL (ref 6–20)
Bun/Cre Ratio: 22 — ABNORMAL HIGH (ref 12–20)
CO2: 26 mmol/L (ref 21–32)
Calcium: 8.7 MG/DL (ref 8.5–10.1)
Chloride: 111 mmol/L — ABNORMAL HIGH (ref 97–108)
Creatinine: 0.88 MG/DL (ref 0.70–1.30)
EGFR IF NonAfrican American: >60 mL/min/{1.73_m2} (ref >60)
GFR African American: >60 mL/min/{1.73_m2} (ref >60)
Glucose: 98 mg/dL (ref 65–100)
Potassium: 3.8 mmol/L (ref 3.5–5.1)
Sodium: 140 mmol/L (ref 136–145)

## 2019-02-24 LAB — POC TROPONIN-I
POC Troponin I: <0.04 ng/mL (ref 0.00–0.08)
Troponin-I (POC): <0.04 ng/mL (ref 0.00–0.08)

## 2019-02-24 LAB — TROPONIN: Troponin I: <0.05 ng/mL (ref <0.05)

## 2019-02-24 LAB — D-DIMER, QUANTITATIVE: D-Dimer, Quant: 0.25 mg/L FEU (ref 0.00–0.65)

## 2019-02-24 LAB — METABOLIC PANEL, BASIC
Anion gap: 3 mmol/L — ABNORMAL LOW (ref 5–15)
BUN/Creatinine ratio: 22 — ABNORMAL HIGH (ref 12–20)
BUN: 19 MG/DL (ref 6–20)
CO2: 26 mmol/L (ref 21–32)
Calcium: 8.7 MG/DL (ref 8.5–10.1)
Chloride: 111 mmol/L — ABNORMAL HIGH (ref 97–108)
Creatinine: 0.88 MG/DL (ref 0.70–1.30)
GFR est AA: >60 mL/min/{1.73_m2} (ref >60)
GFR est non-AA: >60 mL/min/{1.73_m2} (ref >60)
Glucose: 98 mg/dL (ref 65–100)
Potassium: 3.8 mmol/L (ref 3.5–5.1)
Sodium: 140 mmol/L (ref 136–145)

## 2019-02-24 LAB — CBC WITH AUTOMATED DIFF
ABS. BASOPHILS: 0 10*3/uL (ref 0.0–0.1)
ABS. EOSINOPHILS: 0.1 10*3/uL (ref 0.0–0.4)
ABS. IMM. GRANS.: 0 10*3/uL (ref 0.00–0.04)
ABS. LYMPHOCYTES: 1.1 10*3/uL (ref 0.8–3.5)
ABS. MONOCYTES: 0.7 10*3/uL (ref 0.0–1.0)
ABS. NEUTROPHILS: 3.7 10*3/uL (ref 1.8–8.0)
ABSOLUTE NRBC: 0 10*3/uL (ref 0.00–0.01)
BASOPHILS: 1 % (ref 0–1)
EOSINOPHILS: 2 % (ref 0–7)
HCT: 36 % — ABNORMAL LOW (ref 36.6–50.3)
HGB: 11.4 g/dL — ABNORMAL LOW (ref 12.1–17.0)
IMMATURE GRANULOCYTES: 0 % (ref 0.0–0.5)
LYMPHOCYTES: 20 % (ref 12–49)
MCH: 24.5 PG — ABNORMAL LOW (ref 26.0–34.0)
MCHC: 31.7 g/dL (ref 30.0–36.5)
MCV: 77.4 FL — ABNORMAL LOW (ref 80.0–99.0)
MONOCYTES: 12 % (ref 5–13)
MPV: 9.3 FL (ref 8.9–12.9)
NEUTROPHILS: 65 % (ref 32–75)
NRBC: 0 PER 100 WBC
PLATELET: 351 10*3/uL (ref 150–400)
RBC: 4.65 M/uL (ref 4.10–5.70)
RDW: 18.2 % — ABNORMAL HIGH (ref 11.5–14.5)
WBC: 5.6 10*3/uL (ref 4.1–11.1)

## 2019-02-24 LAB — TROPONIN I: Troponin-I, Qt.: <0.05 ng/mL (ref <0.05)

## 2019-02-24 LAB — SAMPLES BEING HELD

## 2019-02-24 LAB — D DIMER: D-dimer: 0.25 mg/L FEU (ref 0.00–0.65)

## 2019-02-24 MED ORDER — NITROGLYCERIN 2 % TRANSDERMAL OINTMENT
2 % | Freq: Two times a day (BID) | TRANSDERMAL | Status: DC
Start: 2019-02-24 — End: 2019-02-24

## 2019-02-24 MED ORDER — ONDANSETRON (PF) 4 MG/2 ML INJECTION
4 mg/2 mL | INTRAMUSCULAR | Status: AC
Start: 2019-02-24 — End: 2019-02-24
  Administered 2019-02-24: 21:00:00 via INTRAVENOUS

## 2019-02-24 MED ORDER — NITROGLYCERIN 2 % TRANSDERMAL OINTMENT
2 % | TRANSDERMAL | Status: AC
Start: 2019-02-24 — End: 2019-02-24
  Administered 2019-02-24: 22:00:00 via TOPICAL

## 2019-02-24 MED FILL — NITRO-BID 2 % TRANSDERMAL OINTMENT: 2 % | TRANSDERMAL | Qty: 1

## 2019-02-24 MED FILL — ONDANSETRON (PF) 4 MG/2 ML INJECTION: 4 mg/2 mL | INTRAMUSCULAR | Qty: 2

## 2019-02-24 NOTE — Progress Notes (Signed)
[facility]: MED RECONCILIATION    Comments/Recommendations:   Reviewed PTA medications with patient who confirms list. This list is consistent with [facility] ED discharge med list in chart.   Patient does not life in this area but would prefer to fill Rx at a local [facility]. Added [address] as preferred pharmacy per pt preference.  Confirmed allergies toradol/tramadol    Medications adjusted:    ASA 81 mg daily  Atorvastatin 10 mg daily  Lisinopril 20 mg daily  Nitrostat- Patient did take today    Information obtained from: Patient, Chart review    Allergies: Toradol [ketorolac tromethamine] and Tramadol    Prior to Admission Medications:     Medication Documentation Review Audit       Reviewed by Taylor, Brandon, PHARMD (Pharmacist) on 02/24/19 at 1652      Medication Sig Documenting Provider Last Dose Status Taking?   aspirin 81 mg chewable tablet Take 81 mg by mouth daily. Other, Phys, MD 02/24/2019 Active Yes   atorvastatin (LIPITOR) 10 mg tablet Take 10 mg by mouth daily. Provider, Historical 02/24/2019 Active Yes   lisinopriL (PRINIVIL, ZESTRIL) 20 mg tablet Take 20 mg by mouth daily. Provider, Historical 02/24/2019 Active Yes   nitroglycerin (NITROSTAT) 0.4 mg SL tablet Take 1 Tab by mouth every five (5) minutes as needed for Chest Pain. Sit/Lay down then put one tab under the tongue every 5 minutes as needed for chest pain for 3 doses Stratton, Dwayne E, MD 02/24/2019 Active Yes                  Thanks,  Brandon Taylor, PHARMD   Contact: [phone]

## 2019-02-24 NOTE — ED Triage Notes (Addendum)
Patient arrives via EMS. Was shopping at [employer] and had 10/10 left sided chest pain radiating to left side of back and left shoulder. Reports that he "passed out" Per EMS, witness did not see patient lose consciousness.     Further reports headache and dizziness, and blurred vision in right eye. States blurred vision began 30 minutes ago. Reports tingling on left side of body.     Patient took 4 x 81mg aspirin PTA. Reports taking nitro pill PTA.     Patient A&O x 4. Denies weakness.     EKG en route: Sinus Tach, no ST elevation or depression.     Hx of non-stemi with angioplasty and stroke.   Patient is current smoker.

## 2019-02-24 NOTE — ED Provider Notes (Signed)
Date of Service:  02/24/2019    Patient:  Miguel Carr    Chief Complaint:  Chest Pain and Headache       HPI:  Miguel Carr is a 48 y.o.  male who presents for evaluation of chest discomfort.  Patient states that he was walking at [facility] and had acute onset chest pain, 10 out of 10 left-sided with radiation to the jaw and arm.  He states that he felt lightheaded and dizzy when patient had a syncopal episode; however, EMS reports that no bystanders could validate this complaint and said he did not pass out.  He resides here with the same complaints.  Patient also notes history of stroke with previous MI and previous stents.  Patient otherwise denies other acute complaints or modifying factors pain is present.           Past Medical History:   Diagnosis Date   ??? Hypertension    ??? Myocardial infarct (HCC) 2013       No past surgical history on file.      No family history on file.    Social History     Socioeconomic History   ??? Marital status: DIVORCED     Spouse name: Not on file   ??? Number of children: Not on file   ??? Years of education: Not on file   ??? Highest education level: Not on file   Occupational History   ??? Not on file   Social Needs   ??? Financial resource strain: Not on file   ??? Food insecurity     Worry: Not on file     Inability: Not on file   ??? Transportation needs     Medical: Not on file     Non-medical: Not on file   Tobacco Use   ??? Smoking status: Never Smoker   Substance and Sexual Activity   ??? Alcohol use: No   ??? Drug use: No     Comment: "used to use medical marijuana"   ??? Sexual activity: Not on file   Lifestyle   ??? Physical activity     Days per week: Not on file     Minutes per session: Not on file   ??? Stress: Not on file   Relationships   ??? Social Wellsite geologist on phone: Not on file     Gets together: Not on file     Attends religious service: Not on file     Active member of club or organization: Not on file     Attends meetings of clubs or organizations: Not on file      Relationship status: Not on file   ??? Intimate partner violence     Fear of current or ex partner: Not on file     Emotionally abused: Not on file     Physically abused: Not on file     Forced sexual activity: Not on file   Other Topics Concern   ??? Not on file   Social History Narrative   ??? Not on file         ALLERGIES: Toradol [ketorolac tromethamine] and Tramadol    Review of Systems   Constitutional: Negative for fever.   HENT: Negative for hearing loss.    Eyes: Negative for visual disturbance.   Respiratory: Negative for shortness of breath.    Cardiovascular: Positive for chest pain.   Gastrointestinal: Negative for abdominal pain.   Genitourinary: Negative for  flank pain.   Musculoskeletal: Negative for back pain.   Skin: Negative for rash.   Neurological: Positive for dizziness, syncope and light-headedness.       Vitals:    02/24/19 1529   BP: 124/73   Pulse: 92   Resp: 17   Temp: 98.9 ??F (37.2 ??C)   SpO2: 95%   Weight: 83.9 kg (185 lb)   Height: 6\' 1"  (1.854 m)            Physical Exam  Vitals signs and nursing note reviewed.   Constitutional:       General: He is not in acute distress.     Appearance: He is well-developed. He is not ill-appearing, toxic-appearing or diaphoretic.   HENT:      Head: Normocephalic and atraumatic.   Eyes:      Conjunctiva/sclera: Conjunctivae normal.      Pupils: Pupils are equal, round, and reactive to light.   Neck:      Musculoskeletal: Neck supple.   Cardiovascular:      Rate and Rhythm: Normal rate and regular rhythm.      Heart sounds: Normal heart sounds. No murmur. No friction rub.   Pulmonary:      Effort: Pulmonary effort is normal. No respiratory distress.      Breath sounds: Normal breath sounds. No decreased breath sounds, wheezing or rhonchi.   Chest:      Chest wall: No mass or tenderness.   Abdominal:      General: There is no distension.      Palpations: Abdomen is soft.      Tenderness: There is no abdominal tenderness.   Musculoskeletal:          General: No tenderness or deformity.      Right lower leg: He exhibits no tenderness.      Left lower leg: He exhibits no tenderness.   Skin:     General: Skin is warm and dry.      Capillary Refill: Capillary refill takes less than 2 seconds.      Findings: No rash.   Neurological:      Mental Status: He is alert and oriented to person, place, and time.   Psychiatric:         Behavior: Behavior normal.         Thought Content: Thought content normal.         Judgment: Judgment normal.          MDM  ED Course as of Feb 23 1937   Thu Feb 24, 2019   1647 Troponin-I, Qt.: <0.05 [GG]   1647 D-dimer: 0.25 [GG]   1652 EKG 1530  NSR 89  Normal axis and intervals  No acute ischemic changes    [GG]      ED Course User Index  [GG] Sherril CroonGoldman, Daylyn Christine S, DO     VITAL SIGNS:  Patient Vitals for the past 4 hrs:   Temp Pulse Resp BP SpO2   02/24/19 1851 98.4 ??F (36.9 ??C) 80 16 128/61 95 %   02/24/19 1730 ??? 82 12 107/64 94 %         LABS:  Recent Results (from the past 6 hour(s))   SAMPLES BEING HELD    Collection Time: 02/24/19  3:42 PM   Result Value Ref Range    SAMPLES BEING HELD [id number]     COMMENT        Add-on orders for these samples will be processed based  on acceptable specimen integrity and analyte stability, which may vary by analyte.   D DIMER    Collection Time: 02/24/19  3:42 PM   Result Value Ref Range    D-dimer 0.25 0.00 - 0.65 mg/L FEU   METABOLIC PANEL, BASIC    Collection Time: 02/24/19  3:42 PM   Result Value Ref Range    Sodium 140 136 - 145 mmol/L    Potassium 3.8 3.5 - 5.1 mmol/L    Chloride 111 (H) 97 - 108 mmol/L    CO2 26 21 - 32 mmol/L    Anion gap 3 (L) 5 - 15 mmol/L    Glucose 98 65 - 100 mg/dL    BUN 19 6 - 20 MG/DL    Creatinine 1.61 0.96 - 1.30 MG/DL    BUN/Creatinine ratio 22 (H) 12 - 20      GFR est AA >60 >60 ml/min/1.63m2    GFR est non-AA >60 >60 ml/min/1.104m2    Calcium 8.7 8.5 - 10.1 MG/DL   CBC WITH AUTOMATED DIFF    Collection Time: 02/24/19  3:42 PM   Result Value Ref Range     WBC 5.6 4.1 - 11.1 K/uL    RBC 4.65 4.10 - 5.70 M/uL    HGB 11.4 (L) 12.1 - 17.0 g/dL    HCT 04.5 (L) 40.9 - 50.3 %    MCV 77.4 (L) 80.0 - 99.0 FL    MCH 24.5 (L) 26.0 - 34.0 PG    MCHC 31.7 30.0 - 36.5 g/dL    RDW 81.1 (H) 91.4 - 14.5 %    PLATELET 351 150 - 400 K/uL    MPV 9.3 8.9 - 12.9 FL    NRBC 0.0 0 PER 100 WBC    ABSOLUTE NRBC 0.00 0.00 - 0.01 K/uL    NEUTROPHILS 65 32 - 75 %    LYMPHOCYTES 20 12 - 49 %    MONOCYTES 12 5 - 13 %    EOSINOPHILS 2 0 - 7 %    BASOPHILS 1 0 - 1 %    IMMATURE GRANULOCYTES 0 0.0 - 0.5 %    ABS. NEUTROPHILS 3.7 1.8 - 8.0 K/UL    ABS. LYMPHOCYTES 1.1 0.8 - 3.5 K/UL    ABS. MONOCYTES 0.7 0.0 - 1.0 K/UL    ABS. EOSINOPHILS 0.1 0.0 - 0.4 K/UL    ABS. BASOPHILS 0.0 0.0 - 0.1 K/UL    ABS. IMM. GRANS. 0.0 0.00 - 0.04 K/UL    DF AUTOMATED     TROPONIN I    Collection Time: 02/24/19  3:42 PM   Result Value Ref Range    Troponin-I, Qt. <0.05 <0.05 ng/mL   POC TROPONIN-I    Collection Time: 02/24/19  6:10 PM   Result Value Ref Range    Troponin-I (POC) <0.04 0.00 - 0.08 ng/mL        IMAGING:  CT HEAD WO CONT   Final Result   IMPRESSION: No acute intracranial hemorrhage, mass or infarct.           XR CHEST PA LAT   Final Result   IMPRESSION:   No acute process.                Medications During Visit:  Medications   ondansetron (ZOFRAN) injection 4 mg (4 mg IntraVENous Given 02/24/19 1615)   nitroglycerin (NITROBID) 2 % ointment 0.5 Inch (0.5 Inches Topical Given 02/24/19 1648)         DECISION  MAKING:  Miguel Carr is a 48 y.o. male who comes in as above.  Here, patient appears well.  Very minimal relief with the medicines provided here, however later, patient had total resolution of his discomfort.  Second troponin unremarkable.  Spoke with patient, he feels comfortable to be discharged home.  Patient is transiently here in [place] but I have provided him with follow-up information if he still here.  He has aspirin, cell phone to call 911 if he needs it and he has transportation from this  facility.  Patient discharged in stable condition.      IMPRESSION:  1. Chest pain, unspecified type        DISPOSITION:  Discharged      Current Discharge Medication List           Follow-up Information     Follow up With Specialties Details [facility] [place]  Schedule an appointment as soon as possible for a visit   [facility]  [phone]    Alvina Chou, MD Cardiology Schedule an appointment as soon as possible for a visit   [address]  [place] [address]  [phone]              The patient is asked to follow-up with their primary care provider in the next several days.  They are to call tomorrow for an appointment.  The patient is asked to return promptly for any increased concerns or worsening of symptoms.  They can return to this emergency department or any other emergency department.    Procedures                           EKG 1530  NSR 89  Normal axis and intervals  No acute ischemic change

## 2019-02-24 NOTE — ED Notes (Signed)
Patient does not appear to be in any acute distress/shows no evidence of clinical instability at this time.     Provider has reviewed discharge instructions with the patient/family.  The patient/family verbalized understanding instructions as well as need for follow up for any further symptoms.  ??  Discharge papers given, education provided, and any questions answered. Patient discharged by provider.    Patient awaiting transport.

## 2019-02-24 NOTE — Progress Notes (Signed)
[facility]: MED RECONCILIATION    Comments/Recommendations:   Reviewed PTA medications with patient who confirms list. This list is consistent with [facility] ED discharge med list in chart.   Patient does not life in this area but would prefer to fill Rx at a local [facility]. Added [address] as preferred pharmacy per pt preference.  Confirmed allergies toradol/tramadol    Medications adjusted:    ASA 81 mg daily  Atorvastatin 10 mg daily  Lisinopril 20 mg daily  Nitrostat- Patient did take today    Information obtained from: Patient, Chart review    Allergies: Toradol [ketorolac tromethamine] and Tramadol    Prior to Admission Medications:     Medication Documentation Review Audit       Reviewed by Suszanne Finch, PHARMD (Pharmacist) on 02/24/19 at 1652      Medication Sig Documenting Provider Last Dose Status Taking?   aspirin 81 mg chewable tablet Take 81 mg by mouth daily. Other, Phys, MD 02/24/2019 Active Yes   atorvastatin (LIPITOR) 10 mg tablet Take 10 mg by mouth daily. Provider, Historical 02/24/2019 Active Yes   lisinopriL (PRINIVIL, ZESTRIL) 20 mg tablet Take 20 mg by mouth daily. Provider, Historical 02/24/2019 Active Yes   nitroglycerin (NITROSTAT) 0.4 mg SL tablet Take 1 Tab by mouth every five (5) minutes as needed for Chest Pain. Sit/Lay down then put one tab under the tongue every 5 minutes as needed for chest pain for 3 doses Jule Ser, MD 02/24/2019 Active Yes                  Thanks,  Suszanne Finch, PHARMD   Contact: [phone]

## 2019-02-24 NOTE — ED Notes (Signed)
Patient does not appear to be in any acute distress/shows no evidence of clinical instability at this time.     Provider has reviewed discharge instructions with the patient/family.  The patient/family verbalized understanding instructions as well as need for follow up for any further symptoms.    Discharge papers given, education provided, and any questions answered. Patient discharged by provider.    Patient awaiting transport.

## 2019-02-24 NOTE — ED Provider Notes (Signed)
Date of Service:  02/24/2019    Patient:  Miguel Carr    Chief Complaint:  Chest Pain and Headache       HPI:  Miguel Carr is a 48 y.o.  male who presents for evaluation of chest discomfort.  Patient states that he was walking at [facility] and had acute onset chest pain, 10 out of 10 left-sided with radiation to the jaw and arm.  He states that he felt lightheaded and dizzy when patient had a syncopal episode; however, EMS reports that no bystanders could validate this complaint and said he did not pass out.  He resides here with the same complaints.  Patient also notes history of stroke with previous MI and previous stents.  Patient otherwise denies other acute complaints or modifying factors pain is present.           Past Medical History:   Diagnosis Date   ??? Hypertension    ??? Myocardial infarct (HCC) 2013       No past surgical history on file.      No family history on file.    Social History     Socioeconomic History   ??? Marital status: DIVORCED     Spouse name: Not on file   ??? Number of children: Not on file   ??? Years of education: Not on file   ??? Highest education level: Not on file   Occupational History   ??? Not on file   Social Needs   ??? Financial resource strain: Not on file   ??? Food insecurity     Worry: Not on file     Inability: Not on file   ??? Transportation needs     Medical: Not on file     Non-medical: Not on file   Tobacco Use   ??? Smoking status: Never Smoker   Substance and Sexual Activity   ??? Alcohol use: No   ??? Drug use: No     Comment: "used to use medical marijuana"   ??? Sexual activity: Not on file   Lifestyle   ??? Physical activity     Days per week: Not on file     Minutes per session: Not on file   ??? Stress: Not on file   Relationships   ??? Social Wellsite geologist on phone: Not on file     Gets together: Not on file     Attends religious service: Not on file     Active member of club or organization: Not on file     Attends meetings of clubs or organizations: Not on file      Relationship status: Not on file   ??? Intimate partner violence     Fear of current or ex partner: Not on file     Emotionally abused: Not on file     Physically abused: Not on file     Forced sexual activity: Not on file   Other Topics Concern   ??? Not on file   Social History Narrative   ??? Not on file         ALLERGIES: Toradol [ketorolac tromethamine] and Tramadol    Review of Systems   Constitutional: Negative for fever.   HENT: Negative for hearing loss.    Eyes: Negative for visual disturbance.   Respiratory: Negative for shortness of breath.    Cardiovascular: Positive for chest pain.   Gastrointestinal: Negative for abdominal pain.   Genitourinary: Negative for  flank pain.   Musculoskeletal: Negative for back pain.   Skin: Negative for rash.   Neurological: Positive for dizziness, syncope and light-headedness.       Vitals:    02/24/19 1529   BP: 124/73   Pulse: 92   Resp: 17   Temp: 98.9 ??F (37.2 ??C)   SpO2: 95%   Weight: 83.9 kg (185 lb)   Height:  (1.854 m)            Physical Exam  Vitals signs and nursing note reviewed.   Constitutional:       General: He is not in acute distress.     Appearance: He is well-developed. He is not ill-appearing, toxic-appearing or diaphoretic.   HENT:      Head: Normocephalic and atraumatic.   Eyes:      Conjunctiva/sclera: Conjunctivae normal.      Pupils: Pupils are equal, round, and reactive to light.   Neck:      Musculoskeletal: Neck supple.   Cardiovascular:      Rate and Rhythm: Normal rate and regular rhythm.      Heart sounds: Normal heart sounds. No murmur. No friction rub.   Pulmonary:      Effort: Pulmonary effort is normal. No respiratory distress.      Breath sounds: Normal breath sounds. No decreased breath sounds, wheezing or rhonchi.   Chest:      Chest wall: No mass or tenderness.   Abdominal:      General: There is no distension.      Palpations: Abdomen is soft.      Tenderness: There is no abdominal tenderness.   Musculoskeletal:         General: No  tenderness or deformity.      Right lower leg: He exhibits no tenderness.      Left lower leg: He exhibits no tenderness.   Skin:     General: Skin is warm and dry.      Capillary Refill: Capillary refill takes less than 2 seconds.      Findings: No rash.   Neurological:      Mental Status: He is alert and oriented to person, place, and time.   Psychiatric:         Behavior: Behavior normal.         Thought Content: Thought content normal.         Judgment: Judgment normal.          MDM  ED Course as of Feb 23 1937   Thu Feb 24, 2019   1647 Troponin-I, Qt.: <0.05 [GG]   1647 D-dimer: 0.25 [GG]   1652 EKG 1530  NSR 89  Normal axis and intervals  No acute ischemic changes    [GG]      ED Course User Index  [GG] Sherril Croon, DO     VITAL SIGNS:  Patient Vitals for the past 4 hrs:   Temp Pulse Resp BP SpO2   02/24/19 1851 98.4 ??F (36.9 ??C) 80 16 128/61 95 %   02/24/19 1730 -- 82 12 107/64 94 %         LABS:  Recent Results (from the past 6 hour(s))   SAMPLES BEING HELD    Collection Time: 02/24/19  3:42 PM   Result Value Ref Range    SAMPLES BEING HELD [email or web address]     COMMENT        Add-on orders for these samples will be processed based  on acceptable specimen integrity and analyte stability, which may vary by analyte.   D DIMER    Collection Time: 02/24/19  3:42 PM   Result Value Ref Range    D-dimer 0.25 0.00 - 0.65 mg/L FEU   METABOLIC PANEL, BASIC    Collection Time: 02/24/19  3:42 PM   Result Value Ref Range    Sodium 140 136 - 145 mmol/L    Potassium 3.8 3.5 - 5.1 mmol/L    Chloride 111 (H) 97 - 108 mmol/L    CO2 26 21 - 32 mmol/L    Anion gap 3 (L) 5 - 15 mmol/L    Glucose 98 65 - 100 mg/dL    BUN 19 6 - 20 MG/DL    Creatinine 1.610.88 0.960.70 - 1.30 MG/DL    BUN/Creatinine ratio 22 (H) 12 - 20      GFR est AA >60 >60 ml/min/1.1373m2    GFR est non-AA >60 >60 ml/min/1.4273m2    Calcium 8.7 8.5 - 10.1 MG/DL   CBC WITH AUTOMATED DIFF    Collection Time: 02/24/19  3:42 PM   Result Value Ref Range    WBC 5.6 4.1 -  11.1 K/uL    RBC 4.65 4.10 - 5.70 M/uL    HGB 11.4 (L) 12.1 - 17.0 g/dL    HCT 04.536.0 (L) 40.936.6 - 50.3 %    MCV 77.4 (L) 80.0 - 99.0 FL    MCH 24.5 (L) 26.0 - 34.0 PG    MCHC 31.7 30.0 - 36.5 g/dL    RDW 81.118.2 (H) 91.411.5 - 14.5 %    PLATELET 351 150 - 400 K/uL    MPV 9.3 8.9 - 12.9 FL    NRBC 0.0 0 PER 100 WBC    ABSOLUTE NRBC 0.00 0.00 - 0.01 K/uL    NEUTROPHILS 65 32 - 75 %    LYMPHOCYTES 20 12 - 49 %    MONOCYTES 12 5 - 13 %    EOSINOPHILS 2 0 - 7 %    BASOPHILS 1 0 - 1 %    IMMATURE GRANULOCYTES 0 0.0 - 0.5 %    ABS. NEUTROPHILS 3.7 1.8 - 8.0 K/UL    ABS. LYMPHOCYTES 1.1 0.8 - 3.5 K/UL    ABS. MONOCYTES 0.7 0.0 - 1.0 K/UL    ABS. EOSINOPHILS 0.1 0.0 - 0.4 K/UL    ABS. BASOPHILS 0.0 0.0 - 0.1 K/UL    ABS. IMM. GRANS. 0.0 0.00 - 0.04 K/UL    DF AUTOMATED     TROPONIN I    Collection Time: 02/24/19  3:42 PM   Result Value Ref Range    Troponin-I, Qt. <0.05 <0.05 ng/mL   POC TROPONIN-I    Collection Time: 02/24/19  6:10 PM   Result Value Ref Range    Troponin-I (POC) <0.04 0.00 - 0.08 ng/mL        IMAGING:  CT HEAD WO CONT   Final Result   IMPRESSION: No acute intracranial hemorrhage, mass or infarct.           XR CHEST PA LAT   Final Result   IMPRESSION:   No acute process.                Medications During Visit:  Medications   ondansetron (ZOFRAN) injection 4 mg (4 mg IntraVENous Given 02/24/19 1615)   nitroglycerin (NITROBID) 2 % ointment 0.5 Inch (0.5 Inches Topical Given 02/24/19 1648)         DECISION  MAKING:  Miguel Carr is a 48 y.o. male who comes in as above.  Here, patient appears well.  Very minimal relief with the medicines provided here, however later, patient had total resolution of his discomfort.  Second troponin unremarkable.  Spoke with patient, he feels comfortable to be discharged home.  Patient is transiently here in [place] but I have provided him with follow-up information if he still here.  He has aspirin, cell phone to call 911 if he needs it and he has transportation from this facility.   Patient discharged in stable condition.      IMPRESSION:  1. Chest pain, unspecified type        DISPOSITION:  Discharged      Current Discharge Medication List           Follow-up Information     Follow up With Specialties Details Why [facility]  Schedule an appointment as soon as possible for a visit   [place]  [phone]    Alvina Chou, MD Cardiology Schedule an appointment as soon as possible for a visit   [address]  [place] [address]  [phone]              The patient is asked to follow-up with their primary care provider in the next several days.  They are to call tomorrow for an appointment.  The patient is asked to return promptly for any increased concerns or worsening of symptoms.  They can return to this emergency department or any other emergency department.    Procedures                           EKG 1530  NSR 89  Normal axis and intervals  No acute ischemic change

## 2019-02-24 NOTE — ED Notes (Signed)
Patient arrives via EMS. Was shopping at [employer] and had 10/10 left sided chest pain radiating to left side of back and left shoulder. Reports that he "passed out" Per EMS, witness did not see patient lose consciousness.     Further reports headache and dizziness, and blurred vision in right eye. States blurred vision began 30 minutes ago. Reports tingling on left side of body.     Patient took 4 x 81mg  aspirin PTA. Reports taking nitro pill PTA.     Patient A&O x 4. Denies weakness.     EKG en route: Sinus Tach, no ST elevation or depression.     Hx of non-stemi with angioplasty and stroke.   Patient is current smoker.

## 2019-02-25 ENCOUNTER — Emergency Department: Admit: 2019-02-25

## 2019-02-25 ENCOUNTER — Inpatient Hospital Stay
Admit: 2019-02-25 | Discharge: 2019-02-25 | Disposition: A | Discharge status: Home / Self Care | Attending: Emergency Medicine

## 2019-02-25 DIAGNOSIS — G43409 Hemiplegic migraine, not intractable, without status migrainosus: Secondary | ICD-10-CM

## 2019-02-25 LAB — CBC WITH AUTO DIFFERENTIAL
Basophils %: 1 % (ref 0–1)
Basophils Absolute: 0 10*3/uL (ref 0.0–0.1)
Eosinophils %: 3 % (ref 0–7)
Eosinophils Absolute: 0.2 10*3/uL (ref 0.0–0.4)
Granulocyte Absolute Count: 0 10*3/uL (ref 0.00–0.04)
Hematocrit: 34.9 % — ABNORMAL LOW (ref 36.6–50.3)
Hemoglobin: 10.9 g/dL — ABNORMAL LOW (ref 12.1–17.0)
Immature Granulocytes: 1 % — ABNORMAL HIGH (ref 0.0–0.5)
Lymphocytes %: 17 % (ref 12–49)
Lymphocytes Absolute: 1 10*3/uL (ref 0.8–3.5)
MCH: 24.3 PG — ABNORMAL LOW (ref 26.0–34.0)
MCHC: 31.2 g/dL (ref 30.0–36.5)
MCV: 77.9 FL — ABNORMAL LOW (ref 80.0–99.0)
MPV: 9.5 FL (ref 8.9–12.9)
Monocytes %: 11 % (ref 5–13)
Monocytes Absolute: 0.7 10*3/uL (ref 0.0–1.0)
NRBC Absolute: 0 10*3/uL (ref 0.00–0.01)
Neutrophils %: 67 % (ref 32–75)
Neutrophils Absolute: 4.2 10*3/uL (ref 1.8–8.0)
Nucleated RBCs: 0 PER 100 WBC
Platelets: 344 10*3/uL (ref 150–400)
RBC: 4.48 M/uL (ref 4.10–5.70)
RDW: 18.1 % — ABNORMAL HIGH (ref 11.5–14.5)
WBC: 6.1 10*3/uL (ref 4.1–11.1)

## 2019-02-25 LAB — COMPREHENSIVE METABOLIC PANEL
ALT: 30 U/L (ref 12–78)
AST: 25 U/L (ref 15–37)
Albumin/Globulin Ratio: 1.2 (ref 1.1–2.2)
Albumin: 3.7 g/dL (ref 3.5–5.0)
Alkaline Phosphatase: 88 U/L (ref 45–117)
Anion Gap: 6 mmol/L (ref 5–15)
BUN: 14 MG/DL (ref 6–20)
Bun/Cre Ratio: 22 — ABNORMAL HIGH (ref 12–20)
CO2: 22 mmol/L (ref 21–32)
Calcium: 8.4 MG/DL — ABNORMAL LOW (ref 8.5–10.1)
Chloride: 108 mmol/L (ref 97–108)
Creatinine: 0.63 MG/DL — ABNORMAL LOW (ref 0.70–1.30)
EGFR IF NonAfrican American: >60 mL/min/{1.73_m2} (ref >60)
GFR African American: >60 mL/min/{1.73_m2} (ref >60)
Globulin: 3.1 g/dL (ref 2.0–4.0)
Glucose: 99 mg/dL (ref 65–100)
Potassium: 4.1 mmol/L (ref 3.5–5.1)
Sodium: 136 mmol/L (ref 136–145)
Total Bilirubin: 0.2 MG/DL (ref 0.2–1.0)
Total Protein: 6.8 g/dL (ref 6.4–8.2)

## 2019-02-25 LAB — EKG 12-LEAD
Atrial Rate: 71 {beats}/min
Diagnosis: NORMAL
P Axis: 54 degrees
P-R Interval: 132 ms
Q-T Interval: 384 ms
QRS Duration: 92 ms
QTc Calculation (Bazett): 417 ms
R Axis: 59 degrees
T Axis: 53 degrees
Ventricular Rate: 71 {beats}/min

## 2019-02-25 LAB — TROPONIN: Troponin I: <0.05 ng/mL (ref <0.05)

## 2019-02-25 LAB — PROTIME-INR
INR: 1.1 (ref 0.9–1.1)
Protime: 11.2 s — ABNORMAL HIGH (ref 9.0–11.1)

## 2019-02-25 LAB — METABOLIC PANEL, COMPREHENSIVE
A-G Ratio: 1.2 (ref 1.1–2.2)
ALT (SGPT): 30 U/L (ref 12–78)
AST (SGOT): 25 U/L (ref 15–37)
Albumin: 3.7 g/dL (ref 3.5–5.0)
Alk. phosphatase: 88 U/L (ref 45–117)
Anion gap: 6 mmol/L (ref 5–15)
BUN/Creatinine ratio: 22 — ABNORMAL HIGH (ref 12–20)
BUN: 14 MG/DL (ref 6–20)
Bilirubin, total: 0.2 MG/DL (ref 0.2–1.0)
CO2: 22 mmol/L (ref 21–32)
Calcium: 8.4 MG/DL — ABNORMAL LOW (ref 8.5–10.1)
Chloride: 108 mmol/L (ref 97–108)
Creatinine: 0.63 MG/DL — ABNORMAL LOW (ref 0.70–1.30)
GFR est AA: >60 mL/min/{1.73_m2} (ref >60)
GFR est non-AA: >60 mL/min/{1.73_m2} (ref >60)
Globulin: 3.1 g/dL (ref 2.0–4.0)
Glucose: 99 mg/dL (ref 65–100)
Potassium: 4.1 mmol/L (ref 3.5–5.1)
Protein, total: 6.8 g/dL (ref 6.4–8.2)
Sodium: 136 mmol/L (ref 136–145)

## 2019-02-25 LAB — PROTHROMBIN TIME + INR
INR: 1.1 (ref 0.9–1.1)
Prothrombin time: 11.2 s — ABNORMAL HIGH (ref 9.0–11.1)

## 2019-02-25 LAB — CBC WITH AUTOMATED DIFF
ABS. BASOPHILS: 0 10*3/uL (ref 0.0–0.1)
ABS. EOSINOPHILS: 0.2 10*3/uL (ref 0.0–0.4)
ABS. IMM. GRANS.: 0 10*3/uL (ref 0.00–0.04)
ABS. LYMPHOCYTES: 1 10*3/uL (ref 0.8–3.5)
ABS. MONOCYTES: 0.7 10*3/uL (ref 0.0–1.0)
ABS. NEUTROPHILS: 4.2 10*3/uL (ref 1.8–8.0)
ABSOLUTE NRBC: 0 10*3/uL (ref 0.00–0.01)
BASOPHILS: 1 % (ref 0–1)
EOSINOPHILS: 3 % (ref 0–7)
HCT: 34.9 % — ABNORMAL LOW (ref 36.6–50.3)
HGB: 10.9 g/dL — ABNORMAL LOW (ref 12.1–17.0)
IMMATURE GRANULOCYTES: 1 % — ABNORMAL HIGH (ref 0.0–0.5)
LYMPHOCYTES: 17 % (ref 12–49)
MCH: 24.3 PG — ABNORMAL LOW (ref 26.0–34.0)
MCHC: 31.2 g/dL (ref 30.0–36.5)
MCV: 77.9 FL — ABNORMAL LOW (ref 80.0–99.0)
MONOCYTES: 11 % (ref 5–13)
MPV: 9.5 FL (ref 8.9–12.9)
NEUTROPHILS: 67 % (ref 32–75)
NRBC: 0 PER 100 WBC
PLATELET: 344 10*3/uL (ref 150–400)
RBC: 4.48 M/uL (ref 4.10–5.70)
RDW: 18.1 % — ABNORMAL HIGH (ref 11.5–14.5)
WBC: 6.1 10*3/uL (ref 4.1–11.1)

## 2019-02-25 LAB — EKG, 12 LEAD, INITIAL
Atrial Rate: 71 {beats}/min
Calculated P Axis: 54 degrees
Calculated R Axis: 59 degrees
Calculated T Axis: 53 degrees
Diagnosis: NORMAL
P-R Interval: 132 ms
Q-T Interval: 384 ms
QRS Duration: 92 ms
QTC Calculation (Bezet): 417 ms
Ventricular Rate: 71 {beats}/min

## 2019-02-25 LAB — TROPONIN I: Troponin-I, Qt.: <0.05 ng/mL (ref <0.05)

## 2019-02-25 MED ORDER — DIPHENHYDRAMINE HCL 50 MG/ML IJ SOLN
50 mg/mL | INTRAMUSCULAR | Status: AC
Start: 2019-02-25 — End: 2019-02-25
  Administered 2019-02-25: 17:00:00 via INTRAVENOUS

## 2019-02-25 MED ORDER — KETOROLAC TROMETHAMINE 30 MG/ML INJECTION
30 mg/mL (1 mL) | INTRAMUSCULAR | Status: AC
Start: 2019-02-25 — End: 2019-02-25
  Administered 2019-02-25: 17:00:00 via INTRAVENOUS

## 2019-02-25 MED ORDER — PROCHLORPERAZINE EDISYLATE 5 MG/ML INJECTION
5 mg/mL | INTRAMUSCULAR | Status: AC
Start: 2019-02-25 — End: 2019-02-25
  Administered 2019-02-25: 17:00:00 via INTRAVENOUS

## 2019-02-25 MED ORDER — DEXAMETHASONE SODIUM PHOSPHATE 4 MG/ML IJ SOLN
4 mg/mL | INTRAMUSCULAR | Status: AC
Start: 2019-02-25 — End: 2019-02-25
  Administered 2019-02-25: 17:00:00 via INTRAVENOUS

## 2019-02-25 MED ORDER — IOPAMIDOL 76 % IV SOLN
37076 mg iodine /mL (76 %) | Freq: Once | INTRAVENOUS | Status: AC
Start: 2019-02-25 — End: 2019-02-25
  Administered 2019-02-25: 16:00:00 via INTRAVENOUS

## 2019-02-25 MED FILL — DIPHENHYDRAMINE HCL 50 MG/ML IJ SOLN: 50 mg/mL | INTRAMUSCULAR | Qty: 1

## 2019-02-25 MED FILL — KETOROLAC TROMETHAMINE 30 MG/ML INJECTION: 30 mg/mL (1 mL) | INTRAMUSCULAR | Qty: 1

## 2019-02-25 MED FILL — DEXAMETHASONE SODIUM PHOSPHATE 4 MG/ML IJ SOLN: 4 mg/mL | INTRAMUSCULAR | Qty: 5

## 2019-02-25 MED FILL — PROCHLORPERAZINE EDISYLATE 5 MG/ML INJECTION: 5 mg/mL | INTRAMUSCULAR | Qty: 2

## 2019-02-25 NOTE — ED Notes (Signed)
Pt now moving LUE and talking on the phone without slurred speech.

## 2019-02-25 NOTE — ED Notes (Signed)
1041 Evaluation via tele neurology

## 2019-02-25 NOTE — ED Notes (Signed)
Transportation arranged by case management. Skin warm and dry. Respirations even and unlabored. In no apparent distress at this time. Alert and interacting appropriately. Ambulatory to ED waiting room with a steady gait and all of his belongings.

## 2019-02-25 NOTE — ED Notes (Signed)
Pt is a difficulty stick - ED tech at bedside for US guided blood draw.

## 2019-02-25 NOTE — ED Provider Notes (Signed)
EMERGENCY DEPARTMENT HISTORY AND PHYSICAL EXAM      Date: 02/25/2019  Patient Name: Miguel Carr  Patient Age and Sex: 48 y.o. male     History of Presenting Illness     Chief Complaint   Patient presents with   ??? Altered mental status     Presents to the ED via EMS. Pt found walking down the highway confused, HA and reported "difficulty focusing". On the way to the ED, EMS noted Lt sided weakness, facial droop, slurred speech. History of TPA multiple times. Last known well-time 10 am. BG 105 pta.       History Provided By: Patient, ems    HPI: Miguel Carr is a 48 year old male with a reported history of hypertension, stroke, presenting for left-sided weakness and dysarthria.  According to patient about 20 minutes prior to arrival started to have acute onset of left-sided weakness as well as dysarthria.  Patient states that he was walking this morning and talking normally.  Patient states that after his prior strokes where he got TPA, his symptoms went back to normal.  Patient denies any nausea, vomiting.  Is having a terrible headache.  Glucose was 105 in route.    There are no other complaints, changes, or physical findings at this time.    PCP: None    Current Facility-Administered Medications on File Prior to Encounter   Medication Dose Route Frequency Provider Last Rate Last Dose   ??? [COMPLETED] ondansetron (ZOFRAN) injection 4 mg  4 mg IntraVENous NOW Myles Rosenthal, DO   4 mg at 02/24/19 1615   ??? [COMPLETED] nitroglycerin (NITROBID) 2 % ointment 0.5 Inch  0.5 Inch Topical NOW Myles Rosenthal, DO   0.5 Inch at 02/24/19 1648   ??? [DISCONTINUED] nitroglycerin (NITROBID) 2 % ointment 0.5 Inch  0.5 Inch Topical BID Myles Rosenthal, DO         Current Outpatient Medications on File Prior to Encounter   Medication Sig Dispense Refill   ??? atorvastatin (LIPITOR) 10 mg tablet Take 10 mg by mouth daily.     ??? lisinopriL (PRINIVIL, ZESTRIL) 20 mg tablet Take 20 mg by mouth daily.      ??? aspirin 81 mg chewable tablet Take 81 mg by mouth daily.     ??? nitroglycerin (NITROSTAT) 0.4 mg SL tablet Take 1 Tab by mouth every five (5) minutes as needed for Chest Pain. Sit/Lay down then put one tab under the tongue every 5 minutes as needed for chest pain for 3 doses 1 Bottle 0       Past History     Past Medical History:  Past Medical History:   Diagnosis Date   ??? Hypertension    ??? Myocardial infarct [facility]) 2013       Past Surgical History:  History reviewed. No pertinent surgical history.    Family History:  History reviewed. No pertinent family history.    Social History:  Social History     Tobacco Use   ??? Smoking status: Never Smoker   Substance Use Topics   ??? Alcohol use: No   ??? Drug use: No     Comment: "used to use medical marijuana"       Allergies:  Allergies   Allergen Reactions   ??? Toradol [Ketorolac Tromethamine] Rash   ??? Tramadol Rash         Review of Systems   Review of Systems   Constitutional: Negative for chills and fever.   Respiratory:  Negative for cough and shortness of breath.    Cardiovascular: Negative for chest pain.   Gastrointestinal: Negative for abdominal pain, constipation, diarrhea, nausea and vomiting.   Genitourinary: Negative for dysuria, frequency and hematuria.   Neurological: Positive for speech difficulty, weakness and headaches. Negative for numbness.   All other systems reviewed and are negative.       Physical Exam   Physical Exam  Vitals signs and nursing note reviewed.   Constitutional:       Appearance: He is well-developed.   HENT:      Head: Normocephalic and atraumatic.      Nose: Nose normal.      Mouth/Throat:      Mouth: Mucous membranes are moist.   Eyes:      Extraocular Movements: Extraocular movements intact.      Conjunctiva/sclera: Conjunctivae normal.   Neck:      Musculoskeletal: Normal range of motion and neck supple.   Cardiovascular:      Rate and Rhythm: Normal rate and regular rhythm.   Pulmonary:       Effort: Pulmonary effort is normal. No respiratory distress.      Breath sounds: Normal breath sounds.   Abdominal:      General: There is no distension.      Palpations: Abdomen is soft.      Tenderness: There is no abdominal tenderness.   Musculoskeletal: Normal range of motion.   Skin:     General: Skin is warm and dry.   Neurological:      Mental Status: He is alert and oriented to person, place, and time.      Comments: Patient has a very slurred speech.  Has left-sided facial droop.  3 out of 5 strength in left upper extremity and left lower extremity.   Psychiatric:         Mood and Affect: Mood normal.          Diagnostic Study Results     Labs -     Recent Results (from the past 12 hour(s))   EKG, 12 LEAD, INITIAL    Collection Time: 02/25/19 10:52 AM   Result Value Ref Range    Ventricular Rate 71 BPM    Atrial Rate 71 BPM    P-R Interval 132 ms    QRS Duration 92 ms    Q-T Interval 384 ms    QTC Calculation (Bezet) 417 ms    Calculated P Axis 54 degrees    Calculated R Axis 59 degrees    Calculated T Axis 53 degrees    Diagnosis       Normal sinus rhythm  Normal ECG  When compared with ECG of 24-Feb-2019 15:30,  MANUAL COMPARISON REQUIRED, DATA IS UNCONFIRMED     CBC WITH AUTOMATED DIFF    Collection Time: 02/25/19 11:23 AM   Result Value Ref Range    WBC 6.1 4.1 - 11.1 K/uL    RBC 4.48 4.10 - 5.70 M/uL    HGB 10.9 (L) 12.1 - 17.0 g/dL    HCT 34.9 (L) 36.6 - 50.3 %    MCV 77.9 (L) 80.0 - 99.0 FL    MCH 24.3 (L) 26.0 - 34.0 PG    MCHC 31.2 30.0 - 36.5 g/dL    RDW 18.1 (H) 11.5 - 14.5 %    PLATELET 344 150 - 400 K/uL    MPV 9.5 8.9 - 12.9 FL    NRBC 0.0 0 PER 100 WBC  ABSOLUTE NRBC 0.00 0.00 - 0.01 K/uL    NEUTROPHILS 67 32 - 75 %    LYMPHOCYTES 17 12 - 49 %    MONOCYTES 11 5 - 13 %    EOSINOPHILS 3 0 - 7 %    BASOPHILS 1 0 - 1 %    IMMATURE GRANULOCYTES 1 (H) 0.0 - 0.5 %    ABS. NEUTROPHILS 4.2 1.8 - 8.0 K/UL    ABS. LYMPHOCYTES 1.0 0.8 - 3.5 K/UL    ABS. MONOCYTES 0.7 0.0 - 1.0 K/UL     ABS. EOSINOPHILS 0.2 0.0 - 0.4 K/UL    ABS. BASOPHILS 0.0 0.0 - 0.1 K/UL    ABS. IMM. GRANS. 0.0 0.00 - 0.04 K/UL    DF AUTOMATED     METABOLIC PANEL, COMPREHENSIVE    Collection Time: 02/25/19 11:23 AM   Result Value Ref Range    Sodium 136 136 - 145 mmol/L    Potassium 4.1 3.5 - 5.1 mmol/L    Chloride 108 97 - 108 mmol/L    CO2 22 21 - 32 mmol/L    Anion gap 6 5 - 15 mmol/L    Glucose 99 65 - 100 mg/dL    BUN 14 6 - 20 MG/DL    Creatinine 0.63 (L) 0.70 - 1.30 MG/DL    BUN/Creatinine ratio 22 (H) 12 - 20      GFR est AA >60 >60 ml/min/1.21m    GFR est non-AA >60 >60 ml/min/1.770m   Calcium 8.4 (L) 8.5 - 10.1 MG/DL    Bilirubin, total 0.2 0.2 - 1.0 MG/DL    ALT (SGPT) 30 12 - 78 U/L    AST (SGOT) 25 15 - 37 U/L    Alk. phosphatase 88 45 - 117 U/L    Protein, total 6.8 6.4 - 8.2 g/dL    Albumin 3.7 3.5 - 5.0 g/dL    Globulin 3.1 2.0 - 4.0 g/dL    A-G Ratio 1.2 1.1 - 2.2     PROTHROMBIN TIME + INR    Collection Time: 02/25/19 11:23 AM   Result Value Ref Range    INR 1.1 0.9 - 1.1      Prothrombin time 11.2 (H) 9.0 - 11.1 sec   TROPONIN I    Collection Time: 02/25/19 11:23 AM   Result Value Ref Range    Troponin-I, Qt. <0.05 <0.05 ng/mL       Radiologic Studies -   CTA CODE NEURO HEAD AND NECK W CONT   Final Result   IMPRESSION:   1. No acute process. No large vessel occlusion, arterial dissection, or   flow-limiting stenosis.    2. Technical factors as above regarding perfusion analysis with no definite   abnormality. If clinically indicated consider MRI.      CT CODE NEURO PERF W CBF   Final Result   IMPRESSION:   1. No acute process. No large vessel occlusion, arterial dissection, or   flow-limiting stenosis.    2. Technical factors as above regarding perfusion analysis with no definite   abnormality. If clinically indicated consider MRI.      CT CODE NEURO HEAD WO CONTRAST   Final Result   IMPRESSION: No acute process or change compared to the prior exam.              CT Results  (Last 48 hours)                02/25/19 1041  CTA CODE  NEURO HEAD AND NECK W CONT Final result    Impression:  IMPRESSION:   1. No acute process. No large vessel occlusion, arterial dissection, or   flow-limiting stenosis.    2. Technical factors as above regarding perfusion analysis with no definite   abnormality. If clinically indicated consider MRI.       Narrative:  INDICATION: Code Stroke       EXAMINATION:  CT ANGIOGRAPHY HEAD AND NECK       COMPARISON: CT head earlier today       TECHNIQUE:  Following the uneventful administration of  intravenous contrast,   axial CT angiography of the head and neck was performed.  3D image   postprocessing was performed.  CT dose reduction was achieved through use of a   standardized protocol tailored for this examination and automatic exposure   control for dose modulation.    Cerebral perfusion analysis using computed tomography with contrast   administration, including post processing of parametric maps with the   termination of cerebral blood flow, cerebral blood volume, and mean transit   time.       FINDINGS:       CTA NECK       Aortic Arch: Patent.   Right Common Carotid Artery: Patent.   Right Internal Carotid Artery: Patent.   NASCET Right: 0-25%.   Left Common Carotid Artery: Patent.   Left Internal Carotid Artery: Patent.   NASCET Left: 0-25%.   Carotid stenosis determined using NASCET criteria.   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 Left   vertebral artery is dominant and patent.   Left Vertebral Artery: Patent.   Cervical Soft Tissues: No significant abnormality.   Lung Apices: No significant abnormality.   Bones: No destructive bone lesion.   Additional Comments: N/A.           CTA HEAD       Posterior Circulation: No flow limiting stenosis or occlusion.   Anterior Circulation: No flow limiting stenosis or occlusion.    Additional Comments: No evidence of aneurysm or vascular malformation.       CT PERFUSION:   Technically Limited perfusion examination, with no definite abnormality   (quantitative measurement felt to be inaccurate).     RCBF < 30% = 0 cc.     Tmax > 6 seconds = 73 cc.    Mismatch volume = 73 cc.           02/25/19 1041  CT CODE NEURO PERF W CBF Final result    Impression:  IMPRESSION:   1. No acute process. No large vessel occlusion, arterial dissection, or   flow-limiting stenosis.    2. Technical factors as above regarding perfusion analysis with no definite   abnormality. If clinically indicated consider MRI.       Narrative:  INDICATION: Code Stroke       EXAMINATION:  CT ANGIOGRAPHY HEAD AND NECK       COMPARISON: CT head earlier today       TECHNIQUE:  Following the uneventful administration of  intravenous contrast,   axial CT angiography of the head and neck was performed.  3D image   postprocessing was performed.  CT dose reduction was achieved through use of a   standardized protocol tailored for this examination and automatic exposure   control for dose modulation.    Cerebral  perfusion analysis using computed tomography with contrast   administration, including post processing of parametric maps with the   termination of cerebral blood flow, cerebral blood volume, and mean transit   time.       FINDINGS:       CTA NECK       Aortic Arch: Patent.   Right Common Carotid Artery: Patent.   Right Internal Carotid Artery: Patent.   NASCET Right: 0-25%.   Left Common Carotid Artery: Patent.   Left Internal Carotid Artery: Patent.   NASCET Left: 0-25%.   Carotid stenosis determined using NASCET criteria.   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 Left   vertebral artery is dominant and patent.   Left Vertebral Artery: Patent.   Cervical Soft Tissues: No significant abnormality.    Lung Apices: No significant abnormality.   Bones: No destructive bone lesion.   Additional Comments: N/A.           CTA HEAD       Posterior Circulation: No flow limiting stenosis or occlusion.   Anterior Circulation: No flow limiting stenosis or occlusion.   Additional Comments: No evidence of aneurysm or vascular malformation.       CT PERFUSION:   Technically Limited perfusion examination, with no definite abnormality   (quantitative measurement felt to be inaccurate).     RCBF < 30% = 0 cc.     Tmax > 6 seconds = 73 cc.    Mismatch volume = 73 cc.           02/25/19 1027  CT CODE NEURO HEAD WO CONTRAST Final result    Impression:  IMPRESSION: No acute process or change compared to the prior exam.               Narrative:  EXAM: CT CODE NEURO HEAD WO CONTRAST       INDICATION: Acute left-sided weakness, slurred speech       COMPARISON: 02/24/2019.       CONTRAST: None.       TECHNIQUE: Unenhanced CT of the head was performed using 5 mm images. Brain and   bone windows were generated. Coronal and sagittal reformats. CT dose reduction   was achieved through use of a standardized protocol tailored for this   examination and automatic exposure control for dose modulation.         FINDINGS:   The ventricles and sulci are normal in size, shape and configuration.. There is   no significant white matter disease. There is no intracranial hemorrhage,   extra-axial collection, or mass effect. The basilar cisterns are open. No CT   evidence of acute infarct.       The bone windows demonstrate no abnormalities. The visualized portions of the   paranasal sinuses and mastoid air cells are clear.           02/24/19 1634  CT HEAD WO CONT Final result    Impression:  IMPRESSION: No acute intracranial hemorrhage, mass or infarct.            Narrative:  INDICATION: reported syncope. Chest pain, headache.       Exam: Noncontrast CT of the brain is performed with 5 mm collimation.        CT dose reduction was achieved with the use of the standardized protocol   tailored for this examination and automatic  exposure control for dose   modulation.       FINDINGS: There is no acute intracranial hemorrhage, mass, mass effect or   herniation. Ventricular system is normal. The gray-white matter differentiation   is well-preserved. The mastoid air cells are well pneumatized. The visualized   paranasal sinuses are normal.               CXR Results  (Last 48 hours)               02/24/19 1609  XR CHEST PA LAT Final result    Impression:  IMPRESSION:   No acute process.            Narrative:  INDICATION: CP       COMPARISON: May 15, 2016 and June 06, 2014       FINDINGS:       Frontal and lateral views of the chest demonstrate a normal cardiomediastinal   silhouette. The lungs are adequately expanded.  There is no edema, effusion,   consolidation, or pneumothorax. Mild chronic superior endplate compression of   T8, unchanged. Mild T4 compression also likely unchanged/chronic.                   Medical Decision Making   I am the first provider for this patient.    I reviewed the vital signs, available nursing notes, past medical history, past surgical history, family history and social history.    Vital Signs-Reviewed the patient's vital signs.  Patient Vitals for the past 12 hrs:   Temp Pulse Resp BP SpO2   02/25/19 1137 ??? 66 ??? 103/63 97 %   02/25/19 1100 ??? 70 ??? 124/65 99 %   02/25/19 1049 98.7 ??F (37.1 ??C) 65 13 111/75 100 %       Records Reviewed: Nursing Notes and Old Medical Records    Provider Notes (Medical Decision Making):   Patient presenting with left-sided weakness as well as dysarthria that is acute.  Differential includes complicated migraine, ischemic versus hemorrhagic stroke, Todd's paralysis. Code s alerted.  Most likely 2/2 migraine.    ED Course:   Initial assessment performed. The patients presenting problems have been discussed, and they are in agreement with the care plan formulated and  outlined with them.  I have encouraged them to ask questions as they arise throughout their visit.    ED Course as of Feb 25 1347   Fri Feb 25, 2019   1028 Per chart review, patient has been seen at different hospitals with similar complaints and has gotten TPA at least 4 times.  Per recent neurology note from 2/14 2020, patient has received TPA at least 4 times and CT, CTA and MRI were all negative.  Neurology was concerned about a stroke mimic and likely psychosomatic in nature less likely complicated migraine.  He was also seen in [place] in February 2020 with similar symptoms and got TPA at that time.    [JS]   4 Spoke with Tele neurologist who agrees that given patient's history and no evidence of CVAs on MRIs in the past and concern for possible complicated migraine, will hold off on TPA at this time.    [JS]   33 Spoke again with neurology who states that she agrees that this is likely a complex migraine and patient even states that this is similar to his migraines in the past.  She would recommend [place] treating him for the migraine and if his symptoms get  better, then discharging him home.  However if his symptoms remain, then keeping him under observation.    [JS]   9518 Is much better.  His headache is improved, and he is able to move his left arm and left leg without any difficulty.  Patient is now speaking on the phone.  According to nurse, patient has been trying to find a homeless shelter to stay at.  In speaking with him for a long period of time, patient was on his way from [place] to [place] via the [place].  Had a medical problem while they were in [place] so got dropped off there and then mated to [place] but now needs to reschedule his [employer] bus.  Will get case management on board.  Otherwise, patient is safe to be discharged home.  Lab work and CTs all negative.    [JS]      ED Course User Index  [JS] Vernell Morgans, MD     Critical Care Time:   0  Disposition:   Discharge Note:  The patient has been re-evaluated and is ready for discharge. Reviewed available results with patient. Counseled patient on diagnosis and care plan. Patient has expressed understanding, and all questions have been answered. Patient agrees with plan and agrees to follow up as recommended, or to return to the ED if their symptoms worsen. Discharge instructions have been provided and explained to the patient, along with reasons to return to the ED.      PLAN:  Discharge Medication List as of 02/25/2019 12:01 PM      1.   2.   Follow-up Information     Follow up With Specialties Details Why Contact Info    [facility] EMERGENCY DEPT Emergency Medicine  If symptoms worsen [address]  [place]  [phone]        3.  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        Please note that this dictation was completed with Dragon, the computer voice recognition software.  Quite often unanticipated grammatical, syntax, homophones, and other interpretive errors are inadvertently transcribed by the computer software.  Please disregard these errors.  Please excuse any errors that have escaped final proofreading.  Thank you.

## 2019-02-25 NOTE — ED Notes (Signed)
1041 Evaluation via tele neurology

## 2019-02-25 NOTE — ED Notes (Signed)
Pt is a difficulty stick - ED tech at bedside for US guided blood draw.

## 2019-02-25 NOTE — ED Provider Notes (Signed)
ED Provider Notes by Vernell Morgans, MD at 02/25/19 [place]                Author: Vernell Morgans, MD  Service: EMERGENCY  Author Type: Physician       Filed: 02/25/19 1348  Date of Service: 02/25/19 1029  Status: Signed          Editor: Vernell Morgans, MD (Physician)               EMERGENCY DEPARTMENT HISTORY AND PHYSICAL EXAM           Date: 02/25/2019   Patient Name: Miguel Carr   Patient Age and Sex: 48 y.o.  male         History of Presenting Illness          Chief Complaint       Patient presents with        ?  Altered mental status             Presents to the ED via EMS. Pt found walking down the highway confused, HA and reported "difficulty focusing". On the way to the ED, EMS noted Lt  sided weakness, facial droop, slurred speech. History of TPA multiple times. Last known well-time 10 am. BG 105 pta.           History Provided By: Patient, ems      HPI: Miguel Carr is a 48 year old  male with a reported history of hypertension, stroke, presenting for left-sided weakness and dysarthria.  According to patient about 20 minutes prior to arrival started to have acute onset of left-sided weakness as well as dysarthria.  Patient states  that he was walking this morning and talking normally.  Patient states that after his prior strokes where he got TPA, his symptoms went back to normal.  Patient denies any nausea, vomiting.  Is having a terrible headache.  Glucose was 105 in route.      There are no other complaints, changes, or physical findings at this time.      PCP: None        Current Facility-Administered Medications on File Prior to Encounter             Medication  Dose  Route  Frequency  Provider  Last Rate  Last Dose              ?  [COMPLETED] ondansetron (ZOFRAN) injection 4 mg   4 mg  IntraVENous  NOW  Madolyn Frieze S, DO     4 mg at 02/24/19 1615     ?  [COMPLETED] nitroglycerin (NITROBID) 2 % ointment 0.5 Inch   0.5 Inch  Topical  NOW  Myles Rosenthal, DO     0.5 Inch at 02/24/19  1648              ?  [DISCONTINUED] nitroglycerin (NITROBID) 2 % ointment 0.5 Inch   0.5 Inch  Topical  BID  Myles Rosenthal, DO                Current Outpatient Medications on File Prior to Encounter          Medication  Sig  Dispense  Refill           ?  atorvastatin (LIPITOR) 10 mg tablet  Take 10 mg by mouth daily.         ?  lisinopriL (PRINIVIL, ZESTRIL) 20 mg tablet  Take 20 mg by mouth daily.         ?  aspirin 81 mg chewable tablet  Take 81 mg by mouth daily.               ?  nitroglycerin (NITROSTAT) 0.4 mg SL tablet  Take 1 Tab by mouth every five (5) minutes as needed for Chest Pain. Sit/Lay down then put one tab under the tongue every 5 minutes as needed for chest  pain for 3 doses  1 Bottle  0             Past History        Past Medical History:     Past Medical History:        Diagnosis  Date         ?  Hypertension           ?  Myocardial infarct [facility])  2013           Past Surgical History:   History reviewed. No pertinent surgical history.      Family History:   History reviewed. No pertinent family history.      Social History:     Social History          Tobacco Use         ?  Smoking status:  Never Smoker       Substance Use Topics         ?  Alcohol use:  No     ?  Drug use:  No             Comment: "used to use medical marijuana"           Allergies:     Allergies        Allergen  Reactions         ?  Toradol [Ketorolac Tromethamine]  Rash         ?  Tramadol  Rash                Review of Systems     Review of Systems    Constitutional: Negative for chills and fever.    Respiratory: Negative for cough and shortness of breath.     Cardiovascular: Negative for chest pain.    Gastrointestinal: Negative for abdominal pain, constipation, diarrhea, nausea and vomiting.    Genitourinary: Negative for dysuria, frequency and hematuria.    Neurological: Positive for speech difficulty, weakness  and headaches. Negative for numbness.    All other systems reviewed and are negative.            Physical  Exam     Physical Exam   Vitals signs and nursing note reviewed.   Constitutional:        Appearance: He is well-developed.   HENT :       Head: Normocephalic and atraumatic.      Nose: Nose normal.      Mouth/Throat:      Mouth: Mucous membranes are moist.    Eyes:       Extraocular Movements: Extraocular movements intact.      Conjunctiva/sclera: Conjunctivae normal.    Neck:       Musculoskeletal: Normal range of motion and neck supple.   Cardiovascular :       Rate and Rhythm: Normal rate and regular rhythm.   Pulmonary :       Effort: Pulmonary effort is normal. No respiratory distress.      Breath sounds: Normal breath sounds.    Abdominal:  General: There is no distension.      Palpations: Abdomen is soft.      Tenderness: There is no abdominal tenderness.     Musculoskeletal: Normal range of motion.    Skin:      General: Skin is warm and dry.   Neurological :       Mental Status: He is alert and oriented to person, place, and time.      Comments: Patient has a very slurred speech.  Has left-sided facial droop.  3 out of 5 strength in left upper extremity  and left lower extremity.    Psychiatric:         Mood and Affect: Mood normal.                Diagnostic Study Results        Labs -         Recent Results (from the past 12 hour(s))     EKG, 12 LEAD, INITIAL          Collection Time: 02/25/19 10:52 AM         Result  Value  Ref Range            Ventricular Rate  71  BPM       Atrial Rate  71  BPM       P-R Interval  132  ms       QRS Duration  92  ms       Q-T Interval  384  ms       QTC Calculation (Bezet)  417  ms       Calculated P Axis  54  degrees       Calculated R Axis  59  degrees       Calculated T Axis  53  degrees       Diagnosis                 Normal sinus rhythm   Normal ECG   When compared with ECG of 24-Feb-2019 15:30,   MANUAL COMPARISON REQUIRED, DATA IS UNCONFIRMED          CBC WITH AUTOMATED DIFF          Collection Time: 02/25/19 11:23 AM         Result  Value  Ref Range             WBC  6.1  4.1 - 11.1 K/uL       RBC  4.48  4.10 - 5.70 M/uL       HGB  10.9 (L)  12.1 - 17.0 g/dL       HCT  34.9 (L)  36.6 - 50.3 %       MCV  77.9 (L)  80.0 - 99.0 FL       MCH  24.3 (L)  26.0 - 34.0 PG       MCHC  31.2  30.0 - 36.5 g/dL       RDW  18.1 (H)  11.5 - 14.5 %       PLATELET  344  150 - 400 K/uL       MPV  9.5  8.9 - 12.9 FL       NRBC  0.0  0 PER 100 WBC       ABSOLUTE NRBC  0.00  0.00 - 0.01 K/uL       NEUTROPHILS  67  32 - 75 %  LYMPHOCYTES  17  12 - 49 %       MONOCYTES  11  5 - 13 %       EOSINOPHILS  3  0 - 7 %       BASOPHILS  1  0 - 1 %       IMMATURE GRANULOCYTES  1 (H)  0.0 - 0.5 %       ABS. NEUTROPHILS  4.2  1.8 - 8.0 K/UL       ABS. LYMPHOCYTES  1.0  0.8 - 3.5 K/UL       ABS. MONOCYTES  0.7  0.0 - 1.0 K/UL       ABS. EOSINOPHILS  0.2  0.0 - 0.4 K/UL       ABS. BASOPHILS  0.0  0.0 - 0.1 K/UL       ABS. IMM. GRANS.  0.0  0.00 - 0.04 K/UL       DF  AUTOMATED          METABOLIC PANEL, COMPREHENSIVE          Collection Time: 02/25/19 11:23 AM         Result  Value  Ref Range            Sodium  136  136 - 145 mmol/L       Potassium  4.1  3.5 - 5.1 mmol/L       Chloride  108  97 - 108 mmol/L       CO2  22  21 - 32 mmol/L       Anion gap  6  5 - 15 mmol/L       Glucose  99  65 - 100 mg/dL       BUN  14  6 - 20 MG/DL       Creatinine  0.63 (L)  0.70 - 1.30 MG/DL       BUN/Creatinine ratio  22 (H)  12 - 20         GFR est AA  >60  >60 ml/min/1.21m       GFR est non-AA  >60  >60 ml/min/1.711m      Calcium  8.4 (L)  8.5 - 10.1 MG/DL       Bilirubin, total  0.2  0.2 - 1.0 MG/DL       ALT (SGPT)  30  12 - 78 U/L       AST (SGOT)  25  15 - 37 U/L       Alk. phosphatase  88  45 - 117 U/L       Protein, total  6.8  6.4 - 8.2 g/dL       Albumin  3.7  3.5 - 5.0 g/dL       Globulin  3.1  2.0 - 4.0 g/dL       A-G Ratio  1.2  1.1 - 2.2         PROTHROMBIN TIME + INR          Collection Time: 02/25/19 11:23 AM         Result  Value  Ref Range            INR  1.1  0.9 - 1.1         Prothrombin time  11.2  (H)  9.0 - 11.1 sec       TROPONIN I          Collection Time: 02/25/19 11:23  AM         Result  Value  Ref Range            Troponin-I, Qt.  <0.05  <0.05 ng/mL           Radiologic Studies -      CTA CODE NEURO HEAD AND NECK W CONT       Final Result     IMPRESSION:     1. No acute process. No large vessel occlusion, arterial dissection, or     flow-limiting stenosis.      2. Technical factors as above regarding perfusion analysis with no definite     abnormality. If clinically indicated consider MRI.            CT CODE NEURO PERF W CBF       Final Result     IMPRESSION:     1. No acute process. No large vessel occlusion, arterial dissection, or     flow-limiting stenosis.      2. Technical factors as above regarding perfusion analysis with no definite     abnormality. If clinically indicated consider MRI.            CT CODE NEURO HEAD WO CONTRAST       Final Result     IMPRESSION: No acute process or change compared to the prior exam.                           CT Results   (Last 48 hours)                                    02/25/19 1041    CTA CODE NEURO HEAD AND NECK W CONT  Final result            Impression:    IMPRESSION:      1. No acute process. No large vessel occlusion, arterial dissection, or      flow-limiting stenosis.       2. Technical factors as above regarding perfusion analysis with no definite      abnormality. If clinically indicated consider MRI.                       Narrative:    INDICATION: Code Stroke             EXAMINATION:  CT ANGIOGRAPHY HEAD AND NECK             COMPARISON: CT head earlier today             TECHNIQUE:  Following the uneventful administration of  intravenous contrast,      axial CT angiography of the head and neck was performed.  3D image      postprocessing was performed.  CT dose reduction was achieved through use of a      standardized protocol tailored for this examination and automatic exposure      control for dose modulation.       Cerebral perfusion analysis  using computed tomography with contrast      administration, including post processing of parametric maps with the      termination of cerebral blood flow, cerebral blood volume, and mean transit      time.             FINDINGS:  CTA NECK             Aortic Arch: Patent.      Right Common Carotid Artery: Patent.      Right Internal Carotid Artery: Patent.      NASCET Right: 0-25%.      Left Common Carotid Artery: Patent.      Left Internal Carotid Artery: Patent.      NASCET Left: 0-25%.      Carotid stenosis determined using NASCET criteria.      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 Left      vertebral artery is dominant and patent.      Left Vertebral Artery: Patent.      Cervical Soft Tissues: No significant abnormality.      Lung Apices: No significant abnormality.      Bones: No destructive bone lesion.      Additional Comments: N/A.                    CTA HEAD             Posterior Circulation: No flow limiting stenosis or occlusion.      Anterior Circulation: No flow limiting stenosis or occlusion.      Additional Comments: No evidence of aneurysm or vascular malformation.             CT PERFUSION:      Technically Limited perfusion examination, with no definite abnormality      (quantitative measurement felt to be inaccurate).        RCBF < 30% = 0 cc.        Tmax > 6 seconds = 73 cc.       Mismatch volume = 73 cc.                               02/25/19 1041    CT CODE NEURO PERF W CBF  Final result            Impression:    IMPRESSION:      1. No acute process. No large vessel occlusion, arterial dissection, or      flow-limiting stenosis.       2. Technical factors as above regarding perfusion analysis with no definite      abnormality. If clinically indicated consider MRI.                       Narrative:    INDICATION: Code Stroke             EXAMINATION:  CT ANGIOGRAPHY HEAD  AND NECK             COMPARISON: CT head earlier today             TECHNIQUE:  Following the uneventful administration of  intravenous contrast,      axial CT angiography of the head and neck was performed.  3D image      postprocessing was performed.  CT dose reduction was achieved through use of a      standardized protocol tailored for this examination and automatic exposure      control for dose modulation.       Cerebral perfusion analysis using computed tomography with contrast      administration,  including post processing of parametric maps with the      termination of cerebral blood flow, cerebral blood volume, and mean transit      time.             FINDINGS:             CTA NECK             Aortic Arch: Patent.      Right Common Carotid Artery: Patent.      Right Internal Carotid Artery: Patent.      NASCET Right: 0-25%.      Left Common Carotid Artery: Patent.      Left Internal Carotid Artery: Patent.      NASCET Left: 0-25%.      Carotid stenosis determined using NASCET criteria.      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 Left      vertebral artery is dominant and patent.      Left Vertebral Artery: Patent.      Cervical Soft Tissues: No significant abnormality.      Lung Apices: No significant abnormality.      Bones: No destructive bone lesion.      Additional Comments: N/A.                    CTA HEAD             Posterior Circulation: No flow limiting stenosis or occlusion.      Anterior Circulation: No flow limiting stenosis or occlusion.      Additional Comments: No evidence of aneurysm or vascular malformation.             CT PERFUSION:      Technically Limited perfusion examination, with no definite abnormality      (quantitative measurement felt to be inaccurate).        RCBF < 30% = 0 cc.        Tmax > 6 seconds = 73 cc.       Mismatch volume = 73 cc.                                02/25/19 1027    CT CODE NEURO HEAD WO CONTRAST  Final result            Impression:    IMPRESSION: No acute process or change compared to the prior exam.                                Narrative:    EXAM: CT CODE NEURO HEAD WO CONTRAST             INDICATION: Acute left-sided weakness, slurred speech             COMPARISON: 02/24/2019.             CONTRAST: None.             TECHNIQUE: Unenhanced CT of the head was performed using 5 mm images. Brain and      bone windows were generated. Coronal and sagittal reformats. CT dose reduction      was achieved through use of a standardized protocol tailored for this      examination  and automatic exposure control for dose modulation.               FINDINGS:      The ventricles and sulci are normal in size, shape and configuration.. There is      no significant white matter disease. There is no intracranial hemorrhage,      extra-axial collection, or mass effect. The basilar cisterns are open. No CT      evidence of acute infarct.             The bone windows demonstrate no abnormalities. The visualized portions of the      paranasal sinuses and mastoid air cells are clear.                          02/24/19 1634    CT HEAD WO CONT  Final result            Impression:    IMPRESSION: No acute intracranial hemorrhage, mass or infarct.                               Narrative:    INDICATION: reported syncope. Chest pain, headache.             Exam: Noncontrast CT of the brain is performed with 5 mm collimation.             CT dose reduction was achieved with the use of the standardized protocol      tailored for this examination and automatic exposure control for dose      modulation.             FINDINGS: There is no acute intracranial hemorrhage, mass, mass effect or      herniation. Ventricular system is normal. The gray-white matter differentiation      is well-preserved. The mastoid air cells are well pneumatized. The visualized      paranasal sinuses are normal.                                  CXR Results   (Last 48 hours)                                    02/24/19 1609    XR CHEST PA LAT  Final result            Impression:    IMPRESSION:      No acute process.                               Narrative:    INDICATION: CP             COMPARISON: May 15, 2016 and June 06, 2014             FINDINGS:             Frontal and lateral views of the chest demonstrate a normal cardiomediastinal      silhouette. The lungs are adequately expanded.  There is no edema, effusion,      consolidation, or pneumothorax. Mild chronic superior endplate compression of      T8, unchanged. Mild T4 compression also likely unchanged/chronic.  Medical Decision Making     I am the first provider for this patient.      I reviewed the vital signs, available nursing notes, past medical history, past surgical history, family history and social history.      Vital Signs-Reviewed the patient's vital signs.   Patient Vitals for the past 12 hrs:            Temp  Pulse  Resp  BP  SpO2            02/25/19 1137  --  66  --  103/63  97 %            02/25/19 1100  --  70  --  124/65  99 %            02/25/19 1049  98.7 ??F (37.1 ??C)  65  13  111/75  100 %           Records Reviewed: Nursing Notes and Old Medical Records      Provider Notes (Medical Decision Making):    Patient presenting with left-sided weakness as well as dysarthria that is acute.  Differential includes complicated migraine, ischemic versus hemorrhagic stroke, Todd's paralysis. Code s alerted.  Most  likely 2/2 migraine.      ED Course:    Initial assessment performed. The patients presenting problems have been discussed, and they are in agreement with the care plan formulated and outlined with them.  I have encouraged them to ask questions as they arise throughout their visit.        ED Course as of Feb 25 1347       Fri Feb 25, 2019        1028  Per chart review, patient has been seen at different hospitals with  similar complaints and has gotten TPA at least 4 times.  Per recent neurology note  from 2/14 2020, patient has received TPA at least 4 times and CT, CTA and MRI were all negative.  Neurology was concerned about a stroke mimic and likely psychosomatic in nature less likely complicated migraine.  He was also seen in [place] in February  2020 with similar symptoms and got TPA at that time.     [JS]        4  Spoke with Tele neurologist who agrees that given patient's history and no evidence of CVAs on MRIs in the past and concern for possible complicated  migraine, will hold off on TPA at this time.     [JS]     25  Spoke again with neurology who states that she agrees that this is likely a complex migraine and patient even states that this is similar to his migraines  in the past.  She would recommend [place] treating him for the migraine and if his symptoms get better, then discharging him home.  However if his symptoms remain, then keeping him under observation.     [JS]     2952  Is much better.  His headache is improved, and he is able to move his left arm and left leg without any difficulty.  Patient is now speaking on the  phone.  According to nurse, patient has been trying to find a homeless shelter to stay at.  In speaking with him for a long period of time, patient was on his way from [place] to [place] via the [place].  Had a medical problem while they were  in [place] so got  dropped off there and then mated to [place] but now needs to reschedule his [employer] bus.  Will get case management on board.  Otherwise, patient is safe to be discharged home.  Lab work and CTs all negative.     [JS]              ED Course User Index   [JS] Vernell Morgans, MD        Critical Care Time:    0   Disposition:   Discharge Note:   The patient has been re-evaluated and is ready for discharge. Reviewed available results with patient. Counseled patient on diagnosis and care plan. Patient has expressed  understanding, and all questions  have been answered. Patient agrees with plan and agrees to follow up as recommended, or to return to the ED if their symptoms worsen. Discharge instructions have been provided and explained to the patient, along with reasons to return to the ED.        PLAN:     Discharge Medication List as of 02/25/2019 12:01 PM            1.     2.      Follow-up Information               Follow up With  Specialties  Details  Why  Contact Info              [facility] EMERGENCY DEPT  Emergency Medicine    If symptoms worsen  [address]   [place]   [phone]             3.  Return to ED if worse            Diagnosis        Clinical Impression:       1.  Hemiplegic migraine without status migrainosus, not intractable            Attestations:   Vernell Morgans, M.D.              Please note that this dictation was completed with Dragon, the computer voice recognition software.  Quite often unanticipated grammatical, syntax, homophones, and other interpretive errors are inadvertently  transcribed by the computer software.  Please disregard these errors.  Please excuse any errors that have escaped final proofreading.  Thank you.

## 2019-02-25 NOTE — ED Notes (Signed)
Transportation arranged by case management. Skin warm and dry. Respirations even and unlabored. In no apparent distress at this time. Alert and interacting appropriately. Ambulatory to ED waiting room with a steady gait and all of his belongings.

## 2019-02-25 NOTE — ED Notes (Signed)
Pt now moving LUE and talking on the phone without slurred speech.

## 2019-02-27 ENCOUNTER — Ambulatory Visit: Admitting: Cardiovascular Disease

## 2019-02-27 ENCOUNTER — Emergency Department
Admit: 2019-02-27 | Disposition: A | Discharge status: Other Acute Inpatient Hospital | Source: Ambulatory Visit | Attending: Emergency Medicine | Admitting: Emergency Medicine

## 2019-02-27 ENCOUNTER — Observation Stay
Admit: 2019-02-27 | Discharge: 2019-02-28 | Discharge status: Against Medical Advice | Payer: No Typology Code available for payment source

## 2019-02-27 LAB — HX TOXICOLOGY-DRUG, SERUM
HX ACETAMINOPHEN: <3 ug/mL — ABNORMAL LOW (ref 10–20)
HX BARBITUATES QL, SERUM: NOT DETECTED
HX BENZODIAZEPINES QL, SERUM: NOT DETECTED
HX ETHANOL: <10 mg/dL
HX SALICYLATE: <4 mg/dL — ABNORMAL LOW (ref 15.0–29.9)
HX TCA: NOT DETECTED

## 2019-02-27 LAB — HX HEM-ROUTINE
HX BASO #: 0 10*3/uL (ref 0.0–0.2)
HX BASO: 1 %
HX EOSIN #: 0.2 10*3/uL (ref 0.0–0.5)
HX EOSIN: 4 %
HX HCT: 36.5 % — ABNORMAL LOW (ref 37.0–47.0)
HX HGB: 11.4 g/dL — ABNORMAL LOW (ref 13.5–16.0)
HX IMMATURE GRANULOCYTE#: 0 10*3/uL (ref 0.0–0.1)
HX IMMATURE GRANULOCYTE: 0 %
HX LYMPH #: 1.1 10*3/uL (ref 1.0–4.0)
HX LYMPH: 19 %
HX MCH: 24.4 pg — ABNORMAL LOW (ref 26.0–34.0)
HX MCHC: 31.2 g/dL — ABNORMAL LOW (ref 32.0–36.0)
HX MCV: 78.2 fL — ABNORMAL LOW (ref 80.0–98.0)
HX MONO #: 0.5 10*3/uL (ref 0.2–0.8)
HX MONO: 9 %
HX MPV: 9.6 fL (ref 9.1–11.7)
HX NEUT #: 3.9 10*3/uL (ref 1.5–7.5)
HX NRBC #: 0 10*3/uL
HX NUCLEATED RBC: 0 %
HX PLT: 386 10*3/uL (ref 150–400)
HX RBC BLOOD COUNT: 4.67 M/uL (ref 4.20–5.50)
HX RDW: 18.3 % — ABNORMAL HIGH (ref 11.5–14.5)
HX SEG NEUT: 67 %
HX WBC: 5.8 10*3/uL (ref 4.0–11.0)

## 2019-02-27 LAB — HX CHEM-ENZ-FRAC: HX TROPONIN I: 0.01 ng/mL (ref 0.00–0.03)

## 2019-02-27 LAB — HX CHEM-PANELS
HX ANION GAP: 8 (ref 3–14)
HX BLOOD UREA NITROGEN: 12 mg/dL (ref 6–24)
HX CHLORIDE (CL): 109 meq/L (ref 98–110)
HX CO2: 21 meq/L (ref 20–30)
HX CREATININE (CR): 0.77 mg/dL (ref 0.57–1.30)
HX GFR, AFRICAN AMERICAN: 125 mL/min/{1.73_m2}
HX GFR, NON-AFRICAN AMERICAN: 108 mL/min/{1.73_m2}
HX GLUCOSE: 112 mg/dL (ref 70–139)
HX POTASSIUM (K): 3.8 meq/L (ref 3.6–5.1)
HX SODIUM (NA): 138 meq/L (ref 135–145)

## 2019-02-27 LAB — HX COAGULATION
HX INR PT: 1 (ref 0.9–1.3)
HX PROTHROMBIN TIME: 11.8 s (ref 9.7–14.0)
HX PTT: 30.4 s (ref 25.7–35.7)

## 2019-02-27 LAB — HX TRANSFUSION

## 2019-02-27 LAB — HX DIABETES: HX GLUCOSE: 112 mg/dL (ref 70–139)

## 2019-02-27 LAB — EKG 12-LEAD
Atrial Rate: 89 {beats}/min
Diagnosis: NORMAL
P Axis: 63 degrees
P-R Interval: 140 ms
Q-T Interval: 348 ms
QRS Duration: 100 ms
QTc Calculation (Bazett): 423 ms
R Axis: 43 degrees
T Axis: 20 degrees
Ventricular Rate: 89 {beats}/min

## 2019-02-27 LAB — EKG, 12 LEAD, INITIAL
Atrial Rate: 89 {beats}/min
Calculated P Axis: 63 degrees
Calculated R Axis: 43 degrees
Calculated T Axis: 20 degrees
Diagnosis: NORMAL
P-R Interval: 140 ms
Q-T Interval: 348 ms
QRS Duration: 100 ms
QTC Calculation (Bezet): 423 ms
Ventricular Rate: 89 {beats}/min

## 2019-02-27 NOTE — ED Provider Notes (Signed)
 .  .  Name: Austin Klein, Austin Klein  MRN: [id number]  Age: 48 yrs  Sex: Male  DOB: May 19, 1970  Arrival Date: 02/27/2019  Arrival Time: 16:33  Account#: [id number]  .  Working Diagnosis: Chest pain, unspecified  PCP: No pcp  .  HPI:  12/13  20:41 This 48 yrs old White Male presents to ER via EMS with          fw2        complaints of Chest Pain.  17:16 The patient is a 48 year old male PMH HLD, headache, substance  am47        use and MI who presents for evaluation of chest pain. The        patient reports about 30 minutes ago while sitting at the [employer] store this evening he developed a sudden onset left sided        chest pain described as a medicine ball thrown at his chest        that radiates to his left arm. He reports he suddenly became        extremely dizzy with black speckles and "blacked out". Unclear        if patient completely lost consciousness but reports he "came        to" when bystanders were calling EMS. He reports mild nausea        but denies diaphoresis, shortness of breath, vomiting, or        abdominal pain.  Marland Kitchen  Historical:  - Allergies: toradol and tramadol and tape;  - Home Meds: Aspirin Oral; Lipitor Oral; Lisinopril Oral;  - PMHx: Headache; High Cholesterol; Angina pectoris;  - PSHx: Coronary Angioplasty;  - Social history: Smoking status: Patient uses tobacco    products, current every day smoker. Patient uses Marijuana    Patient/guardian denies using alcohol.  - The history from nurses notes was reviewed: and I agree with    what is documented.  .  .  ROS:  17:19 Constitutional: Negative for fever, chills, and weight loss,    am47        Eyes: Negative for injury, pain, redness, and discharge, ENT:        Negative for injury, pain, and discharge, Cardiovascular:        Positive for chest pain. Negative for palpitations and edema,        Respiratory: Negative for shortness of breath, cough, wheezing,        and pleuritic chest pain, Abdomen/GI: Negative for abdominal        pain, nausea,  vomiting, diarrhea, and constipation, GU:        Negative for injury, bleeding, discharge, and swelling,        MS/Extremity: Negative for injury and deformity, Skin: Negative        for injury, rash, and discoloration, Neuro: Negative for        headache, weakness, numbness, tingling, and seizure,        Allergy/Immunology: Negative for rash or hives Endocrine:        Negative for weight changes Hematologic/Lymphatic: Negative for  .  Name:Austin Klein  [id number]  [id number]  Page 1 of 7  %%PAGE  .  Name: Klein, Austin  MRN: [id number]  Age: 62 yrs  Sex: Male  DOB: September 10, 1970  Arrival Date: 02/27/2019  Arrival Time: 16:33  Account#: [id number]  .  Working Diagnosis: Chest pain, unspecified  PCP: No pcp  .  unusual bruising  .  Vital Signs:  16:34 BP 119 / 68; Pulse 95; Resp 18; Temp 36.4(O); Pulse Ox 95% on   ji2        R/A; Weight 86.18 kg; Height 6 ft. 1 in. (185.42 cm);  17:38 BP 109 / 56;                                                    az8  20:57 BP 106 / 76; Pulse 70; Resp 14; Pulse Ox 96% on R/A;            dc25  16:34 Body Mass Index 25.07 (86.18 kg, 185.42 cm)                     ji2  .  Glasgow Coma Score:  16:34 Eye Response: spontaneous(4). Verbal Response: oriented(5).     ji2        Motor Response: obeys commands(6). Total: 15.  .  Exam:  17:20 Constitutional:  Well developed,  unkempt male whois awake,     am47        alert and oriented Head/Face:  Normocephalic, atraumatic. Eyes:         conjugate gaze.  pupils equal, round and reactive. conjuntiva        clear ENT:  Moist mucous membranes Neck:  supple Respiratory:        Clear to auscultation throughout with no increased work of        breathing Cardiovascular:  Regular rate and rhythm Abdomen/GI:        Soft, non-tender, non-distended with no rebound or guarding.        Skin:  warm and dry MS/ Extremity:  No obvious deformity or        asymmetry. No edema, erythema or cyanosis Neuro:  Awake and        alert, GCS 15, oriented to  person, place, time, and situation.        Cranial nerves II-XII grossly intact.  Motor strength 5/5 in        all extremities.light touch intact.  Cerebellar exam normal.  .  MDM:  20:41 The patient was not given aspirin in the Emergency Department.  fw2        The patient has taken aspirin within the past 24 hours.  20:41 Counseling: I had a detailed discussion with the patient and/or fw2        guardian regarding: the historical points, exam findings, and        any diagnostic results supporting the admission diagnosis.  20:41 Data reviewed: EKG, lab test result(s), nurses notes, old       fw2        medical records, radiologic studies, vital signs.  .  12/13  16:39 Order name: BUN (Blood Urea Nitrogen); Complete Time: 20:43     ji2  12/13  16:39 Order name: CBC/Diff (With Plt); Complete Time: 20:43           ji2  12/13  16:39 Order name: CR (Creatinine); Complete Time: 20:43               ji2  .  Name:Klein, Austin  [id number]  [id number]  Page 2 of 7  %%PAGE  .  Name: Klein, Austin  MRN: [id number]  Age: 31 yrs  Sex: Male  DOB: 1970-07-15  Arrival Date: 02/27/2019  Arrival Time: 16:33  Account#: [id number]  .  Working Diagnosis: Chest pain, unspecified  PCP: No pcp  .  12/13  16:39 Order name: GLU (Glucose); Complete Time: 20:43                 ji2  12/13  16:39 Order name: LYTES (Na, K, Cl, Co2); Complete Time: 20:43        ji2  12/13  16:39 Order name: PT (Prothrombin Time With INR); Complete Time: 20:43ji2  12/13  16:39 Order name: PTT; Complete Time: 20:43                           ji2  12/13  16:39 Order name: Troponin I; Complete Time: 20:43                    ji2  12/13  16:41 Order name: Wyvonnia Dusky; Complete Time: 16:54             dispa  t  12/13  16:42 Order name: [facility]; Complete Time: 16:54                 dispa  t  12/13  17:20 Order name: Blood Bank Hold; Complete Time: 20:43               dispa  t  12/13  17:23 Order name: GFR, AA; Complete Time: 20:43                        dispa  t  12/13  17:23 Order name: GFR, NAA; Complete Time: 20:43                      dispa  t  12/13  17:26 Order name: Serum Drug Screen; Complete Time: 20:43             dispa  t  12/13  16:39 Order name: Adult EKG (order using folder); Complete Time: 16:41ji2  12/13  16:39 Order name: Cardiac Monitor; Complete Time: 16:52               ji2  12/13  16:39 Order name: EKG (order using folder); Complete Time: 16:59      ji2  12/13  16:39 Order name: Pulse Oximetry Continuous; Complete Time: 16:52     ji2  12/13  16:59 Order name: ADD Tox Screen - Serum; Complete Time: 17:01        am47  12/13  16:59 Order name: ADD Tox Screen - Urine; Complete Time: 17:01        am47  12/13  17:28 Order name: COVID-19 PCR (Asymptomatic patients)                am47  12/13  18:14 Order name: Patient Belongings List; Complete Time: 18:28       kb30  .  Dispensed Medications:  17:38 Drug: Nitroglycerin Tablet 0.4mg  0.4 mg Route: Sublingual;      az8  .  Marland Kitchen  Name:Guallpa, Ethin  [id number]  [id number]  Page 3 of 7  %%PAGE  .  Name: Fernandez, Kenley  MRN: [id number]  Age: 72 yrs  Sex: Male  DOB: 06/22/1970  Arrival Date: 02/27/2019  Arrival Time: 16:33  Account#: [id number]  .  Working Diagnosis: Chest pain, unspecified  PCP: No pcp  .  Marland Kitchen  Attending Notes:  16:47  HEART score History- Suspicion: Moderate (1 pt) Age 22- 10 (1   fw2        pt) Risk factors >or= 3 (2 pts) Total Score 4= Score 0-3 with        0.9-1.7% MACE 30 days.  17:25 Attestation: Assessment and care plan reviewed with             fw2/k  n12        resident/midlevel provider. See their note for details.        Physician Assistant's history reviewed, patient interviewed and        examined. Attending HPI: Social History: Smoker Drinks alcohol        Patient admits to Marijuana Use. Family History: Family member        with similar symptoms HPI: 48 y/o male w a PMHx of HA's, HLD,        Bipolar disorder, Atherosclerosis, Angina pectoris, and CAD s/p        stenting  presents via EMS to the ED for evaluation of chest        pain. Per EMS, pt was on the green line and was sitting in an        [employer] store for some time where he had a sudden onset of left        sided chest pain w/ left arm and leg radiation and subsequently        took 324 mgs of ASA 30 minutes ago PTA. Pt reports during the        episode, he experienced sudden dizziness, felt as if he had        stood up too fast, and reports he saw black spots. He        characterizes his CP during the incident as if he had been hit        in the chest with a medicine ball and rates it a 8/10 on the        pain scale. During my visit, he notes no improvement in his        constant CP but does notice improvement in his dizziness.        Additionally, pt is unsure if he had a syncopal episode in the        store. Pt reports he is currently moving from [place] and is        staying at a friend's place currently. Pt denies any N/V/D,        SOB, or any other complaints at this time. Pt also states that        this is a similar pain to his last heart related problems. Of        note, pt last tested negative for COVID19 on December 1st.  17:59 Attending ROS Cardiovascular: Positive for chest pain,          fw2/k  n12        Respiratory: Negative for shortness of breath, Abdomen/GI:        Negative for nausea, vomiting, diarrhea, Neuro: Positive for        dizziness, visual changes, All other systems are negative.        Attending Exam: My personal exam reveals Head: NC/AT Eyes: 2mm        pupils, minimally reactive, EOMI, no nystagmus ENT: Moist        mucous membranes, OP clear, TMs clear b/l. Neck: Soft and  supple, no JVD, full ROM, no adenopathy, no c-spine tenderness.        Chest: No palpable reproducible left sided chest wall        discomfort, no e/o trauma. Lungs: CTAB. CVS: RRR, normal S1 and        S2, no m/r. Abdomen: Soft, nontender, no palpable masses, no        guarding or rebound. Genitourinary: Normal penis and  testes, no        inguinal adenopathy. Extremities: Full ROM of all joints, no  .  Name:Stcharles, [name]  [id number]  [id number]  Page 4 of 7  %%PAGE  .  Name: Keny, Donald  MRN: [id number]  Age: 32 yrs  Sex: Male  DOB: [birth year]  Arrival Date: 02/27/2019  Arrival Time: 16:33  Account#: [id number]  .  Working Diagnosis: Chest pain, unspecified  PCP: No pcp  .        e/o trauma to LE, no edema, no palpable reproducible upper        extremity discomfort. Back: No palpable reproducible back        discomfort, no e/o trauma. Skin: Intact, no e/o cellulitis.        Neuro: Awake and alert, MAE, no facial asymmetry, no gross        focal deficits. I have reviewed Nurses Notes, Old Records in:        [facility]. the EMS run sheet.  18:24 Lab/Ancillary show: Labs were reviewed and interpreted by me:   fw2/k  n12        Negative Trop, Prothrombin time of 11.8, 11.4 HGB, HCT 36.5 EKG        interpreted by me and shows: Sinus at 85 bpm, intervals of        0.12, 0.08, 0.42, no acute ischemia. ED Course: 48 y/o male        presents via EMS to the ED for evaluation of chest pain. My        physical exam is notable for no JVD, no palpable reproducible        upper extremity discomfort, back discomfort, or left sided        chest discomfort as documented. Significant lab findings        include a negative Trop, Prothrombin time of 11.8, 11.4 HGB,        and HCT 36.5. EKG shows a sinus at 85 bpm, intervals of 0.12,        0.08, 0.42, no acute ischemia. While in my care, the pt was        given NTG to good effect. A Heart Score of 4 was calculated        according to the pt's extensive cardiac history and family        history and at this time the pt meets admission criteria. I        discussed plans of admission with the pt for further treatment        and continuance of care to which he understood and was        agreeable to. My Working Impression: 1. Cardiac related chest        pain.  18:26 Scribe Scribe Chart Complete Scribe  chart completed for Dr.     fw2/k  Levada Schilling. I, Chyrl Civatte, am scribing for, and under the        direction of, Dr. Anola Gurney. Electronically signed by: Olene Craven  Najdoski, 02/27/19.  20:40 Attending chart complete and electronically signed: Dwana Curd MD. I have personally performed the services described in        this documentation, as written by my scribe above in my        presence. It is both accurate and complete when signed by me. I        have performed any pertinent edits.  .  Disposition Summary:  02/27/19 17:28  Hospitalization Ordered        Hospitalization Status: Observation                             am47        Provider: Renaldo Harrison        Location: Telemetry                                             am47        Condition: Stable                                               am47        Problem: new                                                    am47  .  Name:Lwin, Sloane  [id number]  [id number]  Page 5 of 7  %%PAGE  .  Name: Khoa, Opdahl  MRN: [id number]  Age: 21 yrs  Sex: Male  DOB: 1970/11/08  Arrival Date: 02/27/2019  Arrival Time: 16:33  Account#: [id number]  .  Working Diagnosis: Chest pain, unspecified  PCP: No pcp  .        Symptoms: are unchanged                                         am47        Bed/Room Type: Regular                                          am47        Room Assignment: [facility] 6(02/27/19 20:11)                        hy2        Diagnosis          - Chest pain, unspecified  OZ30        Forms:          - Archivist          - Fax Summary                                                 am47  Signatures:  Dispatcher, [employer]                          dispa  Beryl Meager                         BSN  hy2  Mayer Camel                           MD   fw2  Hassan Buckler                         RN   [address], [place]                          kb30  Timoleon, Djinnie                            Pittman, Amy                           PA-C am47  Cecille Amsterdam                         Sec  [address] Scribe                   Scribkn12  Rexanne Mano                                 [phone]  .  Corrections: (The following items were deleted from the chart)  16:36 16:34 PMHx: Hypertension; ji2                                   ji2  18:08 17:25 Attending HPI: Social History: Smoker Drinks alcohol      fw2/k  n12        Patient admits to Marijuana Use. Family History: Family member        with similar symptoms HPI: 48 y/o male w a PMHx of HA's, HLD,        Bipolar disorder, Atherosclerosis, Angina pectoris, and CAD s/p        stenting presents via EMS to the ED for evaluation for chest        pain. Per EMS, pt was on the green line and was sitting in an        [employer] store for some time where he had a sudden  onset of left        sided chest pain w/ left arm and leg radiation and subsequently        took 324 mgs of ASA 30 minutes ago PTA. Pt reports during the        episode, he experienced sudden dizziness, felt as if he had        stood up too fast, and reports he saw black spots. He        characterizes his CP during the incident as if he had been hit        in the chest with a medicine ball and rates it a 8/10 on the        pain scale. During my visit, he notes no improvement in his        constant CP but does notice improvement in his dizziness.        Additionally, pt is unsure if he had a syncopal episode in the        store. Pt reports he is currently moving from [place] and is        staying at a friend's place currently. Pt denies any N/V/D,        SOB, or any other complaints at this time. Pt also states that        this is a similar pain to his last heart related problems. Of  .  Name:Uyeno, Kashmir  [id number]  [id number]  Page 6 of 7  %%PAGE  .  Name: Sekou, Zuckerman  MRN:  [id number]  Age: 63 yrs  Sex: Male  DOB: 1970/10/01  Arrival Date: 02/27/2019  Arrival Time: 16:33  Account#: [id number]  .  Working Diagnosis: Chest pain, unspecified  PCP: No pcp  .        note, pt last tested negative for COVID19 on December 1st        fw2/kn12  18:08 17:59 Attending Exam: My personal exam reveals Head: NC/AT      fw2/k  n12        Eyes: 2mm pupils, minimally reactive, EOMI, no nystagmus ENT:        Moist mucous membranes, OP clear, TMs clear b/l. Neck: Soft and        supple, full ROM, no adenopathy, no c-spine tenderness. Chest:        No palpable tenderness, no e/o trauma. Lungs: CTAB. CVS: RRR,        normal S1 and S2, no m/r. Abdomen: Soft, nontender, no palpable        masses, no guarding or rebound. Genitourinary: Normal penis and        testes, no inguinal adenopathy. Extremities: Full ROM of all        joints, no e/o trauma to LE, no edema. Back: No palpable        tenderness, no e/o trauma. Skin: Intact, no e/o cellulitis.        Neuro: Awake and alert, MAE, no facial asymmetry, no gross        focal deficits fw2/kn12  18:14 17:28 am47                                                      kb30  20:11 18:14 *PENDNG BED* kb30  hy2  .  Document is preliminary until electronically or manually signed by the atte  nding physician  .  .  .  .  .  .  .  .  .  .  .  .  .  .  .  .  .  .  .  .  .  .  .  .  .  Name:Bruney, Anvay  [id number]  [id number]  Page 7 of 7  .  %%END

## 2019-02-27 NOTE — ED Provider Notes (Signed)
 .  .  Name: Austin Klein, Austin Klein  MRN: [id number]  Age: 48 yrs  Sex: Male  DOB: 09/16/1970  Arrival Date: 02/27/2019  Arrival Time: 16:33  Account#: [id number]  Bed Pending Adult  PCP: No pcp  Chief Complaint: Chest Pain  .  Presentation:  12/13  16:33 Presenting complaint: EMS states: coming from [employer] store chest  ji2        pain w/l arm radiation took ASA 324, +MI in past, and a stroke,        denies any cough or fevers.  16:33 Method Of Arrival: EMS: [facility]                              ji2  16:33 Acuity: Adult 2                                                 ji2  .  Historical:  - Allergies:  16:34 toradol and tramadol and tape;                                  ji2  - Home Meds:  16:34 Aspirin Oral [Active]; Lipitor Oral [Active]; Lisinopril Oral   ji2        [Active];  - PMHx:  16:34 Headache; High Cholesterol; Angina pectoris;                    ji2  - PSHx:  16:34 Coronary Angioplasty;                                           ji2  .  - Social history: Smoking status: Patient uses tobacco    products, current every day smoker. Patient uses Marijuana    Patient/guardian denies using alcohol.  - The history from nurses notes was reviewed: and I agree with    what is documented.  .  .  Screening:  16:34 SEPSIS SCREENING SIRS Criteria (> = 2) No. Safety screen:       ji2        Patient feels safe. Fall Risk None identified. Exposure        Risk/Travel Screening: COVID Symptomsquestion None. Known COVID 19        exposurequestion No. DPH requests Isolationquestion(COVID) No. Have you        tested + for COVIDquestion No.  .  Vital Signs:  16:34 BP 119 / 68; Pulse 95; Resp 18; Temp 36.4(O); Pulse Ox 95% on   ji2        R/A; Weight 86.18 kg; Height 6 ft. 1 in. (185.42 cm);  17:38 BP 109 / 56;                                                    az8  20:57 BP 106 / 76; Pulse 70; Resp 14; Pulse Ox 96% on R/A;            dc25  16:34 Body  Mass Index 25.07 (86.18 kg, 185.42 cm)                     ji2  .  Glasgow Coma  Score:  16:34 Eye Response: spontaneous(4). Verbal Response: oriented(5).     ji2  .  Name:Austin Klein  [id number]  [id number]  Page 1 of 4  %%PAGE  .  Name: Austin Klein  MRN: [id number]  Age: 30 yrs  Sex: Male  DOB: 1970-03-27  Arrival Date: 02/27/2019  Arrival Time: 16:33  Account#: [id number]  Bed Pending Adult  PCP: No pcp  Chief Complaint: Chest Pain  .        Motor Response: obeys commands(6). Total: 15.  .  Triage Assessment:  16:34 General: Appears comfortable, Behavior is anxious, appropriate  ji2        for age, cooperative. Pain: Complains of pain in chest. Neuro:        Eye opening: Spontaneously Level on consciousness: Sustained        Attention Verbal Response: Orientation: Oriented x 3 Speech:        Clear. Respiratory: Airway is patent Respiratory pattern is        regular, symmetrical. Skin: Skin is pink, warm / dry.  .  Assessment:  17:18 Reassessment: Pt presents to ED c/o chest pain that began today az8        30 mins ago while at store. pt states he was sitting speaking w        someone when pain began, radiating to L arm. at worst 10/10        pain. currently 7/10. c/o sob, denies n/v/diarrhea. On cont        cardiac monitor at this time, ekg done, iv placed. md at        bedside to assess. will cont to monitor. General: Appears        uncomfortable, unkempt, Behavior is cooperative. Pain:        Complains of pain in chest Pain began 30 min ago. Neuro: Eye        opening: Spontaneously Level on consciousness: Sustained        Attention Verbal Response: Orientation: Oriented x 3.  19:15 Reassessment: Received report from Alyssa RN, assumed care of   dc25        pt. Introduced myself to pt. Pt requesting pain medication and        and food and drink. Will discuss with provider.  21:18 Respiratory: Airway is patent Respiratory effort is even,       dc25        unlabored, Respiratory pattern is regular, symmetrical.  .  Observations:  16:33 Patient arrived in ED.                                           ji2  16:34 Triage Completed.                                               ji2  16:39 Patient Visited By: Mayer Camel                               fw2  17:20  Patient Visited By: Melany Guernsey  17:27 Registration completed.                                         dt6  17:27 Patient Visited By: Michel Harrow                           dt6  18:14 Patient assigned to A1                                          kb30  20:11 Patient assigned to A1                                          hy2  .  Procedure:  16:58 EKG done. (by ED staff). Old EKG Obtained Reviewed By: Resa Miner MD.  17:20 Inserted peripheral IV: 18 gauge in left antecubital area.      az8  18:10 COVID-19 PCR (Asymptomatic patients) Sent.                      MM4  .  Name:Austin Klein  [id number]  [id number]  Page 2 of 4  %%PAGE  .  Name: Austin Klein  MRN: [id number]  Age: 60 yrs  Sex: Male  DOB: 24-Mar-1970  Arrival Date: 02/27/2019  Arrival Time: 16:33  Account#: [id number]  Bed Pending Adult  PCP: No pcp  Chief Complaint: Chest Pain  .  Marland Kitchen  Dispensed Medications:  17:38 Drug: Nitroglycerin Tablet 0.4mg  0.4 mg Route: Sublingual;      az8  .  Marland Kitchen  Medication:  19:26 Admission Order Medication given: Tylenol 650 mg PO per orders  dc25        in [place].  Marland Kitchen  Interventions:  17:06 ECG/EKG Scanned into Chart                                      dw  17:20 Armband on Placed in gown Call light in reach Bed in low        az8        position Side Rail up X 2. Cardiac monitor on. Pulse ox on.        NIBP on.  17:33 Demo Sheet Scanned into Chart                                   dw  18:20 EMS Sheet Scanned into Chart                                    dw  18:28 Patient Belongings Scanned into Chart  dw  18:29 Property Checklist done Valuables Left with patient.            dn7  19:25 Warm blanket given.                                             dc25  21:01 Other:  TICKET TO RIDE Scanned into Chart                        dw  .  Outcome:  17:28 Decision to Hospitalize by Provider.                            am47  20:58 Admitted to [facility] 6 via stretcher, with chart. Condition:       dc25        stable. Chart Status Nursing note complete and electronically        signed.  21:20 Patient left the ED.                                            dc25  .  Corrections: (The following items were deleted from the chart)  16:36 16:34 PMHx: Hypertension; Floria Raveling  .  Signatures:  [place] Charon, Delta                                  dw  Ronks, Dalton                         BSN  hy2  Mayer Camel                           MD   fw2  Hassan Buckler                        RN   [address], [place]                          [address]                               jr26  Rhine, Djinnie                            dt6  [place], [place]                           PA-C am47  Chase Picket                         BSN  dc25  [place], [place]  Daria Pastures  Name:DegarmoIrwin, Toran  [id number]  [id number]  Page 3 of 4  %%PAGE  .  Name: Shlok, Raz  MRN: [id number]  Age: 60 yrs  Sex: Male  DOB: 04-13-1970  Arrival Date: 02/27/2019  Arrival Time: 16:33  Account#: [id number]  Bed Pending Adult  PCP: No pcp  Chief Complaint: Chest Pain  .  Lauralee Evener                               dn7  .  .  .  .  .  .  .  .  .  .  .  .  .  .  .  .  .  .  .  .  .  .  .  .  .  .  .  .  .  .  .  .  .  .  .  .  .  .  .  .  .  .  Name:Lanyon, Dorn  [id number]  [id number]  Page 4 of 4  .  %%END

## 2019-02-28 LAB — HX CHEM-ENZ-FRAC: HX TROPONIN I: 0.01 ng/mL (ref 0.00–0.03)

## 2019-02-28 LAB — HX MICRO-RESP VIRAL PANEL: HX COVID-19 (SARS-COV-2) ADMIT SCREEN: NEGATIVE

## 2019-03-20 LAB — BMP (EXT)
Anion Gap (EXT): 11 mmol/L (ref 4–15)
BUN (EXT): 14 mg/dL (ref 7–20)
CO2 (EXT): 21 mmol/L — ABNORMAL LOW (ref 22–30)
CalciumCalcium (EXT): 9.2 mg/dL (ref 8.4–10.2)
Chloride (EXT): 108 mmol/L (ref 98–109)
Creatinine (EXT): 0.67 mg/dL (ref 0.66–1.25)
Glucose (EXT): 99 mg/dL (ref 74–100)
Potassium (EXT): 4.1 mmol/L (ref 3.5–5.5)
Sodium (EXT): 140 mmol/L (ref 137–145)
eGFR - Creat MDRD (EXT): >90 mL/min/1.73 "square meters" (ref >=60)
eGFR - Creat MDRD (EXT): >90 mL/min/1.73 "square meters" (ref >=60)

## 2019-03-21 LAB — BMP (EXT)
Anion Gap (EXT): 7 mmol/L (ref 4–15)
BUN (EXT): 15 mg/dL (ref 7–20)
CO2 (EXT): 23 mmol/L (ref 22–30)
CalciumCalcium (EXT): 8.9 mg/dL (ref 8.4–10.2)
Chloride (EXT): 108 mmol/L (ref 98–109)
Creatinine (EXT): 0.57 mg/dL — ABNORMAL LOW (ref 0.66–1.25)
Glucose (EXT): 101 mg/dL — ABNORMAL HIGH (ref 74–100)
Potassium (EXT): 4.2 mmol/L (ref 3.5–5.5)
Sodium (EXT): 138 mmol/L (ref 137–145)
eGFR - Creat MDRD (EXT): >90 mL/min/1.73 "square meters" (ref >=60)
eGFR - Creat MDRD (EXT): >90 mL/min/1.73 "square meters" (ref >=60)

## 2019-03-22 LAB — HEMOGLOBIN A1C - EXTERNAL
Hemoglobin A1C: 5.8 % — ABNORMAL HIGH (ref 4.0–5.6)
eAG Estimated Ave Glucose: 120 mg/dL — NL

## 2019-04-15 ENCOUNTER — Ambulatory Visit: Payer: MEDICAID

## 2019-04-15 ENCOUNTER — Inpatient Hospital Stay
Admit: 2019-04-15 | Discharge: 2019-04-15 | Disposition: A | Discharge status: Home / Self Care | Payer: Medicaid HMO | Source: Home / Self Care

## 2019-04-15 LAB — APTT: APTT: 31.1 s (ref 24.4–36.2)

## 2019-04-15 LAB — Troponin I: TROPONIN INTERPRETATION: NEGATIVE ng/mL (ref <0.1)

## 2019-04-15 LAB — CK, Total: CREATINE PHOSPHOKINASE, TOTAL: 220 U/L (ref 63–473)

## 2019-04-15 LAB — Prothrombin Time Panel: PROTHROMBIN TIME: 14.7 s — ABNORMAL HIGH (ref 11.5–14.4)

## 2019-04-15 LAB — Basic Metabolic Panel
GFR ESTIMATE FOR NON-AFRICAN AMERICAN: >89 mL/min/{1.73_m2} (ref 7–22)
SODIUM: 137 mmol/L (ref 135–146)

## 2019-04-15 LAB — CBC: MEAN CORPUSCULAR VOLUME: 76.3 fL — ABNORMAL LOW (ref 79.3–98.6)

## 2019-04-15 LAB — Differential Automated: BASOPHIL PERCENT, AUTO: 0.5 (ref 0.00–0.50)

## 2019-04-15 MED ADMIN — ONDANSETRON HCL 4 MG/2ML IJ SOLN: 4 mg | INTRAVENOUS | @ 20:00:00 | Stop: 2019-04-15 | NDC 60505613005

## 2019-04-15 MED ADMIN — ACETAMINOPHEN 325 MG PO TABS: 650 mg | ORAL | @ 20:00:00 | Stop: 2019-04-15 | NDC 00904677361

## 2019-04-15 NOTE — ED Provider Notes
[facility]  Emergency Department Service Report    [name] [name] 49 y.o. male , presents with Chest Pain      Triage   Arrived on 04/15/2019 at 11:11 AM   Arrived by SM 4 [70]    ED Triage Vitals   Temp Temp Source BP Heart Rate Resp SpO2 O2 Device Pain Score Weight   04/15/19 1120 04/15/19 1120 04/15/19 1120 04/15/19 1120 04/15/19 1120 04/15/19 1120 04/15/19 1120 04/15/19 1113 04/15/19 1114   36.7 ?C (98.1 ?F) Oral 111/73 92 23 93 % None (Room air) Five 86.2 kg (190 lb)       Pre hospital care:  Interventions  Prehospital 12 Lead EKG Reading: NSr  Medications: ASA    Allergies   Allergen Reactions   ? Ketorolac Tromethamine    ? Tramadol    ? Plastic Tape Rash       History   Patient is a 49 y.o. male with hx of heart attack, HLD, HTN, Stroke, and TIA who [place] d/t acute onset L sided chest pain. Chest pain is mild, intermittent, unchanged, with no known alleviating factors or exacerbating factors. Pt reports to live in a hostel.     The history is provided by the patient. No language interpreter was used.   Chest Pain  The chest pain began less than 1 hour ago. Chest pain occurs intermittently. The chest pain is unchanged. The severity of the chest pain is mild. The pain does not radiate. Exacerbated by: no known exacerbants. He tried nothing for the symptoms. There are no known risk factors.              Past Medical History:   Diagnosis Date   ? Heart attack (HCC/RAF)    ? Hyperlipidemia    ? Hypertension    ? Stroke (HCC/RAF)    ? TIA (transient ischemic attack)         Past Surgical History:   Procedure Laterality Date   ? BACK SURGERY     ? CRANIOPLASTY FOR CRANIAL DEFECT     ? KNEE JOINT MANIPULATION          Past Family History   Family history reviewed by me and there is no pertinent past family history related to the patient's current case and/or care.     Past Social History he reports that he has been smoking cigarettes. He has been smoking about 0.06 packs per day. He has never used smokeless tobacco. He reports current drug use. Drug: Marijuana. He reports previously being sexually active. He reports that he does not drink alcohol.     Review of Systems   Cardiovascular: Positive for chest pain.   All other systems reviewed and are negative.      Physical Exam   Physical Exam  Vitals signs and nursing note reviewed.   Constitutional:       General: He is not in acute distress.     Appearance: He is well-developed.   HENT:      Head: Normocephalic and atraumatic.   Eyes:      Conjunctiva/sclera: Conjunctivae normal.   Neck:      Musculoskeletal: Normal range of motion and neck supple.   Cardiovascular:      Rate and Rhythm: Normal rate and regular rhythm.   Pulmonary:      Effort: Pulmonary effort is normal. No respiratory distress.      Breath sounds: Normal breath sounds.  Abdominal:      Palpations: Abdomen is soft.      Tenderness: There is no abdominal tenderness. There is no guarding or rebound.   Musculoskeletal: Normal range of motion.   Lymphadenopathy:      Cervical: No cervical adenopathy.   Skin:     General: Skin is warm and dry.   Neurological:      Mental Status: He is alert.      Sensory: No sensory deficit.      Comments: Moving all four extremities.   Psychiatric:         Behavior: Behavior normal.         ED Course          Laboratory Results   Labs Reviewed - No data to display    Imaging Results     No orders to display       Administered Medications     Medication Administration from 04/15/2019 1111 to 04/15/2019 1143       Date/Time Order Dose Route Action Action by Comments     04/15/2019 1133 ondansetron 4 mg/2 mL inj 4 mg 4 mg Intravenous Given Randol Kern, RN      04/15/2019 1133 acetaminophen tab 650 mg 650 mg Oral Given Matthes, Landry Mellow, RN           Procedures   Procedures    MDM  ED Course:  Nursing note reviewed. Previous medical records were obtained and reviewed by myself. They reveal a history of heart attack, HLD, HTN, stroke, and TIA.    1119: I visited the patient to obtain history and perform the physical exam. IV access was secured. Orders placed for lab analysis, XR chest ap portable (1 View) Patient will be administered acetaminophen 650 mg oral and ondansetron 4mg  IV.    Laboratory Results (as interpreted by me):  ***    Radiology Results (as interpreted by me):   ***    Medical Decision Making:  [name] [name] presents to the ED today with chief complaint of chest pain.  On exam, the patient exhibits no acute physical exam findings.    Given the patient's physical exam, laboratory analysis, and imaging studies, my impression is the patient is experiencing an episode of ***. Therefore, I feel comfortable discharging the patient home with close outpatient follow up.    Discharge Instructions:  I advised the patient to follow up with their primary care physician on an outpatient basis over the next 24-48 hours, and recommended prompt  return to the emergency department if they have additional concerns or note worsening symptoms.  The patient indicates understanding of these issues and agrees with the plan.  A copy of the ED  workup results was provided to the patient as well.    The patient was evaluated in the context of a global pandemic, which necessitated consideration that the patient may be at risk for infection with COVID-19. Institutional protocols that pertain to patients at risk for COVID-19 are in a state of rapid change based on the CDC along with federal and state regulatory bodies. These policies and algorithms were followed and met standards of care during the patients care in the emergency department.    Clinical Impression   No diagnosis found.    Prescriptions     New Prescriptions    No medications on file       Disposition and Follow-up   Disposition:     No future appointments. Follow up with:  No follow-up provider specified.    Return precautions are specified on After Visit Summary.    The documentation on this chart was performed by Sharlyne Cai, scribed for Pablo Ledger, MD    04/15/2019 11:38 AM     ***

## 2019-04-15 NOTE — ED Notes
COLLECTIVE?NOTIFICATION?04/15/2019 11:11?Leblond, Kassem?MRN: [id number]    [name] patient encounter information:   XBJ:[id number]  Account [id number]  Billing Account [id number]      Criteria Met      6 Visits in 180 Days    Security and Safety  No recent Security Events currently on file    ED Care Guidelines  There are currently no ED Care Guidelines for this patient. Please check your facility's medical records system.  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  drug seeking behaviors + drug screen for THC and opiates.   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            E.D. Visit Count (12 mo.)  Facility Visits   [facility] 1   [facility] [address] [facility] 2   [facility] 1   [facility] 1   [facility] 1   [facility] 1   [facility] 1   [facility] 1   [facility] 1   [facility] 1   [facility] 1   [facility] 1   [place] [facility] 1   [facility] 1   [facility] 2   [facility] 1   [facility] 1   [facility] 2   [facility] 1   [facility] 1   [facility] 1   [facility] 2   [facility] 1   [facility] 1   [facility] 1   [facility] 2   [facility] 1   [facility] 1   [facility] 1   [facility] 1   Total 36   Note: Visits indicate total known visits.     Recent Emergency Department Visit Summary  Showing 10 most recent visits out of 36 in the past 12 months  Date [facility] Type Diagnoses or Chief Complaint   Apr 15, 2019 [facility] [place] [facility]     Feb 28, 2019 [facility] [place] Emergency      Chet pain      Chest Pain      Chest pain, unspecified      Feb 27, 2019 [facility] [place] [place] Emergency      1. Chest pain, unspecified      2. Atherosclerotic heart disease of native coronary artery with unspecified angina pectoris      3. Essential (primary) hypertension  4. Bipolar disorder, unspecified      5. Encounter for observation for suspected exposure to other biological agents ruled out      6. Hyperlipidemia, unspecified      7. Cannabis use, unspecified, uncomplicated      8. Nicotine dependence, cigarettes, uncomplicated      9. Long term (current) use of aspirin      10. Coronary angioplasty status      Feb 26, 2019 [facility] [place] [place] [facility]      Right lower quadrant pain      Nausea with vomiting, unspecified      Long term (current) use of aspirin      Essential (primary) hypertension      Nicotine dependence, unspecified, uncomplicated      Feb 25, 2019 [facility] [place] [place] [facility]      Hemiplegic migraine, not intractable, without status migrainosus      Feb 24, 2019 [facility] [place] [place] [facility]      Chest pain, unspecified      Feb 22, 2019 [facility] [place] [place] [facility]  Chief Complaint: Chest pain Nov 18, 2018 [place] [place]. [place] [facility]      Chest Pain      Chest pain, unspecified      Essential (primary) hypertension      Nov 01, 2018 [facility] [place] [facility]  Chief Complaint: HEADACHE    Oct 31, 2018 [facility] [place]. [place] Emergency  Chief Complaint: *Headache        Recent Inpatient Visit Summary  Date Facility [facility] Type Diagnoses or Chief Complaint   Nov 14, 2018 [facility] [place] [facility]      Chest pain, unspecified      Syncope and collapse      Nov 01, 2018 [facility]  Chief Complaint: HEADACHE    Oct 31, 2018 [facility] [place]      1. Syncope and collapse      2. Hemiplegia and hemiparesis following cerebral infarction affecting left non-dominant side      3. Headache      4. Old myocardial infarction      Oct 30, 2018 [facility]      1. Hemiplegia, unspecified affecting left nondominant side      1. Nontraumatic intracranial hemorrhage, unspecified      2. Migraine with aura, not intractable, without status migrainosus      3. 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 Aphasia      5. Nicotine dependence, cigarettes, uncomplicated      6. Anemia, unspecified      7. Dissociative and conversion disorder, unspecified      8. Facial weakness      9. Essential (primary) hypertension      Oct 28, 2018 [place]      Chest pain, unspecified      Atherosclerotic heart disease of native coronary artery without angina pectoris      Essential (primary) hypertension      Hyperlipidemia, unspecified      Personal history of transient ischemic attack (TIA), and cerebral infarction without residual deficits      Sep 21, 2018 [facility] [place] Inpatient      Weakness      Essential (primary) hypertension      Bipolar disorder, unspecified      Anesthesia of skin Atherosclerotic heart disease of native coronary artery without angina pectoris      Status post administration of tPA (rtPA) in a different facility within the last 24 hours prior to admission to current facility      Sep 17, 2018 [facility] [place] Inpatient         Care Team  [facility] Phone Fax Service Dates   Buzzy Han , MD Family Medicine [phone]  Apr 17, 2018 - Current    DORFMAN, JUSTIN , D.O. Internal Medicine   Current    Nelida Meuse, MA, LPC, NCC Counselor: Professional [phone]  Current    Madalyn Rob, LMFTA Marriage and Family Therapist [phone]  Current    Dorann Lodge LMFT Marriage and Family Therapist [phone] Current    [facility] [phone] Apr 17, 2018 - Current    [facility] Primary Care   Current    [employer] [facility] Primary Care   Current    BOCK,CINDY L Case or Care Manager   Current    Jaynie Crumble Primary Care   Current    Ella Jubilee Primary Care [phone] Current    [facility] [phone]  Current    Dimitri Ped Primary Care [phone] Feb 26, 2016 - Current    [facility] Caroli Primary Care   Current    [facility] Primary Care   Current    [facility] Primary Care   Current    [facility] Primary Care   Current    [facility] Primary Care   Current    [facility] Primary Care   Current    [facility] Primary Care  [phone] Current      Collective Portal  This patient has registered at the [facility] Emergency Department   For more information visit: [email or web address]   PLEASE NOTE: 1.   Any care recommendations and other clinical information are provided as guidelines or for historical purposes only, and providers should exercise their own clinical judgment when providing care.    2.   You may only use this information for purposes of treatment, payment or health care operations activities, and subject to the limitations of applicable Collective Policies.    3.   You should consult directly with the organization that provided a care guideline or other clinical history with any questions about additional information or accuracy or completeness of information provided.    ? 2021 [place], [employer] - [email or web address]

## 2019-04-15 NOTE — ED Notes
After care instructions discussed at bedside.   Advised patient to follow up w/ [facility] in 3 days.   Patient defensive stating, ''Why isn't another troponin drawn in 4 hrs?'' Advised pt that MD will be called to bedside to discuss, however, patient refusing to wait.   Patient ambulatory w/ a steady gait upon discharge.

## 2019-05-12 LAB — PROTHROMBIN TIME CARE EVERYWHERE
INR CARE EVERYWHERE: 1.1 Ratio
PROTHROMBIN TIME CARE EVERYWHERE: 14.1 s (ref 11.7–14.3)

## 2019-05-13 LAB — COVID-19 CARE EVERYWHERE: COVID-19 CARE EVERYWHERE: NOT DETECTED

## 2019-05-15 LAB — PROTHROMBIN TIME CARE EVERYWHERE
INR CARE EVERYWHERE: 1.1 Ratio
PROTHROMBIN TIME CARE EVERYWHERE: 13.8 s (ref 11.7–14.3)

## 2019-05-15 LAB — APTT CARE EVERYWHERE: APTT CARE EVERYWHERE: 30.7 s (ref 23.8–36.1)

## 2019-05-21 LAB — APTT CARE EVERYWHERE: APTT CARE EVERYWHERE: 34.1 s (ref 24.0–35.0)

## 2019-05-21 LAB — PROTHROMBIN TIME CARE EVERYWHERE
INR CARE EVERYWHERE: 1 (ref 0.9–1.1)
PROTHROMBIN TIME CARE EVERYWHERE: 13.5 s (ref 11.8–14.2)

## 2019-06-02 ENCOUNTER — Inpatient Hospital Stay
Admit: 2019-06-02 | Discharge: 2019-06-03 | Disposition: A | Discharge status: Home / Self Care | Payer: Medicaid HMO | Source: Home / Self Care

## 2019-06-02 ENCOUNTER — Ambulatory Visit: Payer: Medicaid HMO

## 2019-06-02 DIAGNOSIS — I2510 Atherosclerotic heart disease of native coronary artery without angina pectoris: Secondary | ICD-10-CM

## 2019-06-02 DIAGNOSIS — R079 Chest pain, unspecified: Secondary | ICD-10-CM

## 2019-06-02 LAB — Glucose, Whole Blood: GLUCOSE, WHOLE BLOOD: 96 mg/dL (ref 65–99)

## 2019-06-02 LAB — D-Dimer: D-DIMER STAGO: 0.4 ug{FEU}/mL (ref <0.60)

## 2019-06-02 LAB — Electrolyte Panel: SODIUM: 142 mmol/L (ref 135–146)

## 2019-06-02 LAB — Urea Nitrogen: UREA NITROGEN: 17 mg/dL (ref 7–22)

## 2019-06-02 LAB — Extra Gold Top

## 2019-06-02 LAB — COVID-19, Influenza A/B, RSV PCR: RSV PCR: NOT DETECTED

## 2019-06-02 LAB — CREATININE: CREATININE: 0.68 mg/dL (ref 0.60–1.30)

## 2019-06-02 LAB — TSH with reflex FT4, FT3: TSH: 0.75 u[IU]/mL (ref 0.3–4.7)

## 2019-06-02 LAB — CBC: WHITE BLOOD CELL COUNT: 4.52 10*3/uL (ref 4.16–9.95)

## 2019-06-02 LAB — Troponin I: TROPONIN INTERPRETATION: NEGATIVE ng/mL (ref <0.1)

## 2019-06-02 LAB — Prothrombin Time Panel: PROTHROMBIN TIME: 13.7 s (ref 11.5–14.4)

## 2019-06-02 LAB — Extra Red Top (Plastic)

## 2019-06-02 LAB — B-Type Natriuretic Peptide: BNP: 93 pg/mL (ref <100)

## 2019-06-02 LAB — GENERIC EXTERNAL RESULT (UNMAPPED)
Creatinine: 0.68 mg/dL — NL (ref 0.60–1.30)
Hgb A1c - HPLC: 5.6 % — NL (ref <5.7)

## 2019-06-02 MED ADMIN — NITROGLYCERIN 0.4 MG SL SUBL: .4 mg | SUBLINGUAL | @ 19:00:00 | Stop: 2019-06-02 | NDC 68462063925

## 2019-06-02 MED ADMIN — MORPHINE SULFATE (PF) 4 MG/ML IV SOLN: 4 mg | INTRAVENOUS | @ 19:00:00 | Stop: 2019-06-02 | NDC 00641612501

## 2019-06-02 MED ADMIN — ONDANSETRON HCL 4 MG/2ML IJ SOLN: 4 mg | INTRAVENOUS | @ 19:00:00 | Stop: 2019-06-02 | NDC 60505613000

## 2019-06-02 MED ADMIN — NITROGLYCERIN 0.4 MG SL SUBL: .4 mg | SUBLINGUAL | @ 21:00:00 | Stop: 2019-06-03 | NDC 68462063925

## 2019-06-02 NOTE — ED Notes
Pt wants to have food, explained pt that his diet order is NPO except meds, will have trop checked at 5pm, diet status will be adjusted according to lab result. Pt attempted to negotiate with Clinical research associate for food. Charge nurse Calla Kicks talked to pt to enforce the same information. Admit team is aware.

## 2019-06-02 NOTE — Nursing Note
1410: Pt arrived to [facility] from ED.  Pt ambulated from gurney to bed, steady gait.  Pt AOx4.    [id number]: break coverage by Jonette Mate.

## 2019-06-02 NOTE — Consults
06/02/2019 1:16 PM    ED CASE MANAGER NOTES    1255-Patient identified by ER team as transferable.  Per hospitalist, pt will be admitted to Obs service, remain at [facility].  PP made aware.      Rochele Pages RN  ED Case Manager  Office : [phone]  [phone]  Fax : [phone]  Pager ID :[phone]
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
## 2019-06-02 NOTE — ED Notes
COLLECTIVE?NOTIFICATION?06/02/2019 11:05?Vandagriff, [name]?MRN: [id number]    Criteria Met      2 Visits in 30 Days    6 Visits in 180 Days    Security and Safety  No recent Security Events currently on file    ED Care Guidelines  There are currently no ED Care Guidelines for this patient. Please check your facility's medical records system.  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  drug seeking behaviors + drug screen for THC and opiates.   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        Prescription Drug Report (12 Mo.)  PDMP query found no report.      E.D. Visit Count (12 mo.)  Facility Visits   [address] [facility] 1   [facility] 2   [facility] 1   [facility] 1   [facility] 1   [facility] 1   [facility] 1   [facility] 1   [facility] 1   [facility] 1   [facility] 1   [facility] 1   [place] [facility] 1   [facility] 1   [facility] 1   [facility] [address] 2   [facility] 1   [facility] 1   [facility] 2   [facility] 1   [facility] 1 [facility] 2   [facility] 1   [address] [facility] [place] [facility] [place] 1   [facility] 1   [facility] 1   [facility] 1   [facility] [place] [place] 2   [facility] [place] 1   [facility] 1   [facility] 1   [place] [facility] 1   [facility] 1   Total 38   Note: Visits indicate total known visits.     Recent Emergency Department Visit Summary  Showing 10 most recent visits out of 38 in the past 12 months  Date Facility [facility] Type Diagnoses or Chief Complaint   Jun 02, 2019 [name] [place] Emergency     May 28, 2019 [facility] [place] [place] Emergency      1. Chest pain, unspecified      2. Nicotine dependence, cigarettes, uncomplicated      3. Anemia, unspecified      4. Atherosclerotic heart disease of native coronary artery without angina pectoris  May 22, 2019 [facility] [place] [place] Emergency      chest pain, HA, Dizzy      Chest Pain      Iron deficiency anemia, unspecified      Chest pain, unspecified      May 21, 2019 [facility] [place]. [place] Emergency      poss stroke      Cerebrovascular Accident      Syncope      Procedure and treatment not carried out because of patient's decision for other reasons      Syncope and collapse      May 19, 2019 [place] [facility] [place] [place] Emergency      Chest pain, unspecified      May 16, 2019 [facility] [place] Emergency      1. Chest pain, unspecified      2. Cervicalgia      2. Essential (primary) hypertension      3. Hyperlipidemia, unspecified      3. Jaw pain      4. Nicotine dependence, cigarettes, uncomplicated      5. Allergy status to narcotic agent      6. Other nonmedicinal substance allergy status      May 04, 2019 [facility] [place] [facility] [place] [place] Emergency      1. chest pain      1. Chest pain, unspecified      3. Chest pain      Apr 28, 2019 [facility]. [place] Emergency      1. CVA      2. Essential (primary) hypertension      3. Patient's noncompliance with other medical treatment and regimen      4. Other stimulant abuse, in remission      5. Personal history of transient ischemic attack (TIA), and cerebral infarction without residual deficits      6. Cerebral infarction, unspecified      Apr 25, 2019 [facility] [place] [place] Emergency      1. Cerebral infarction, unspecified      2. Hemiplegia, unspecified affecting left nondominant side      3. Dizziness and giddiness      4. Facial weakness      5. Slurred speech      6. Other chest pain      7. Hypertensive heart disease with heart failure      8. Heart failure, unspecified      9. Atherosclerotic heart disease of native coronary artery without angina pectoris      10. Anxiety disorder, unspecified      Apr 24, 2019 [facility] Millard - [facility] [place] [facility]      1. Chest pain, unspecified      2. Nicotine dependence, cigarettes, uncomplicated      3. Nausea      4. Old myocardial infarction      5. Hypertensive heart disease with heart failure      6. Heart failure, unspecified      7. Hyperlipidemia, unspecified      8. Chronic obstructive pulmonary disease, unspecified      9. Allergy status to other drugs, medicaments and biological substances      10. Other long term (current) drug therapy          Recent Inpatient Visit Summary  Date Facility [facility] Type Diagnoses or Chief Complaint  Apr 28, 2019 Kaylyn Lim - [employer]. [place] Tele[facility]      2. Patient's noncompliance with other medical treatment and regimen      2. Essential (primary) hypertension      4. Other stimulant abuse, in remission      5. Personal history of transient ischemic attack (TIA), and cerebral infarction without residual deficits      6. Cerebral infarction, unspecified      6. Post-traumatic stress disorder, chronic      6. Weakness      7. Malingerer [conscious simulation]      8. Old myocardial infarction      67. CVA      Apr 25, 2019 [facility] Mill[facility] - [facility]      1. Cerebral infarction, unspecified      2. Hemiplegia, unspecified affecting unspecified side      3. Chronic diastolic (congestive) heart failure      4. Hypertensive heart disease with heart failure      5. Unspecified atrial fibrillation      6. Status post administration of tPA (rtPA) in a different facility within the last 24 hours prior to admission to current facility      7. NIHSS score 10      8. Homelessness      9. Atherosclerotic heart disease of native coronary artery without angina pectoris      10. Facial weakness      Nov 14, 2018 [facility] [place]      Chest pain, unspecified      Syncope and collapse      Nov 01, 2018 [facility]  Chief Complaint: HEADACHE    Oct 31, 2018 [facility] [place] [facility]      1. Syncope and collapse      2. Hemiplegia and hemiparesis following cerebral infarction affecting left non-dominant side      3. Headache      4. Old myocardial infarction      Oct 30, 2018 [facility]      1. Hemiplegia, unspecified affecting left nondominant side      1. Nontraumatic intracranial hemorrhage, unspecified      2. Migraine with aura, not intractable, without status migrainosus      3. 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 Aphasia      5. Nicotine dependence, cigarettes, uncomplicated      6. Anemia, unspecified      7. Dissociative and conversion disorder, unspecified      8. Facial weakness      9. Essential (primary) hypertension      Oct 28, 2018 [place] [place] [facility]      Chest pain, unspecified      Atherosclerotic heart disease of native coronary artery without angina pectoris      Essential (primary) hypertension      Hyperlipidemia, unspecified      Personal history of transient ischemic attack (TIA), and cerebral infarction without residual deficits      Sep 21, 2018 [facility] [place] Inpatient      Weakness      Essential (primary) hypertension      Bipolar disorder, unspecified      Anesthesia of skin      Atherosclerotic heart  disease of native coronary artery without angina pectoris      Status post administration of tPA (rtPA) in a different facility within the last 24 hours prior to admission to current facility      Sep 17, 2018 [facility]  [facility] Phone Fax Service Dates   Buzzy Han , MD Family Medicine [phone] Apr 17, 2018 - Current    DORFMAN, JUSTIN , D.O. Internal Medicine   Current    Nelida Meuse, MA, LPC, NCC Counselor: Professional [phone]  Current    Madalyn Rob, LMFTA Marriage and Family Therapist [phone]  Current    Dorann Lodge LMFT Marriage and Family Therapist [phone] Current    Buzzy Han Primary Care  [phone] Apr 17, 2018 - Current    DOCTOR MISC Primary Care   Current    [facility] Primary Care   Current    BOCK,CINDY L Case or Care Manager   Current    Jaynie Crumble Primary Care   Current    Ella Jubilee Primary Care [phone] Current    [facility] Primary [facility] [phone]  Current    Dimitri Ped Primary Care [phone] Feb 26, 2016 - Current    Colvin Caroli Primary Care   Current    [facility] Primary Care   Current    [facility] Primary Care   Current    PCP 0O Primary Care   Current    [facility] Primary Care   Current    [facility] Primary Care   Current    Levin Erp Primary Care  [phone] Current      Collective Portal  This patient has registered at the [facility] Emergency Department   For more information visit: [email or web address]   PLEASE NOTE:     1.   Any care recommendations and other clinical information are provided as guidelines or for historical purposes only, and providers should exercise their own clinical judgment when providing care.    2.   You may only use this information for purposes of treatment, payment or health care operations activities, and subject to the limitations of applicable Collective Policies.    3.   You should consult directly with the organization that provided a care guideline or other clinical history with any questions about additional information or accuracy or completeness of information provided.    ? 2021 [place], [employer] - [email or web address]

## 2019-06-02 NOTE — H&P
Hospitalist History & Physical    PATIENT NAME:  [name] [name]  AGE: 49 y.o.  MRN:  [id number]  DOB:  07/28/1970    DATE OF ADMISSION: 06/02/2019    DATE OF SERVICE:  06/02/2019    PRIMARY CARE PHYSICIAN: Pcp, No, MD  ADMITTING PHYSICIAN: Salley Scarlet. Jahmiyah Dullea, MD    CHIEF COMPLAINT: Chest pain    HISTORY OF PRESENT ILLNESS:  [name] is a 49 y.o. male with a history of CAD c/b MI s/p PCI, CVA, HTN, HLD, Tourette's, and psych disorder who is presenting with chest pain. The patient states he was visiting a friend at the [facility] and when he was walking up an incline he began to have left sided sharp pressure-like chest pain. Pain is 6-7/10. Associated with shortness of breath and lightheadedness. Denies F/C, palpitations, cough, N/V, abdominal pain, urinary symptoms, and diarrhea. Pt states pain improved with nitroglycerin. Pt states he had an MI 6 years ago, at which time he had a stent placed. 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 He currently lives in [place] but was visiting a friend in [place] and has been staying at a hostel in [place]. Pt is a current daily smoker. Pt endorses marijuana use, otherwise denies illicit drug use.   Per review of Care Everywhere, pt has had multiple ED visits at [facility] and [facility] for complaints of chest pain and neuro complaints.    EMERGENCY DEPARTMENT COURSE:   ED Triage Vitals   Temp Temp Source BP Heart Rate Resp SpO2 O2 Device Pain Score Weight   06/02/19 1113 06/02/19 1113 06/02/19 1113 06/02/19 1113 06/02/19 1113 06/02/19 1113 06/02/19 1115 06/02/19 1113 06/02/19 1113   37 ?C (98.6 ?F) Oral 145/77 80 18 96 % None (Room air) Six 83.9 kg (185 lb)       Medications   nitroglycerin SL tab 0.4 mg (0.4 mg Sublingual Given 06/02/19 1130)   morphine PF 4 mg/mL inj 4 mg (4 mg IV Push Given 06/02/19 1155)   ondansetron 4 mg/2 mL inj 4 mg (4 mg Intravenous Given 06/02/19 1155)   nitroglycerin SL tab 0.4 mg (0.4 mg Sublingual Given 06/02/19 1204)       REVIEW OF SYSTEMS:   Fourteen point ROS was negative other than the pertinent positives and negatives listed above    PAST MEDICAL HISTORY:  Past Medical History:   Diagnosis Date   ? Heart attack (HCC/RAF)    ? Hyperlipidemia    ? Hypertension    ? Stroke (HCC/RAF)    ? TIA (transient ischemic attack)        PAST SURGICAL HISTORY:  Past Surgical History:   Procedure Laterality Date   ? BACK SURGERY     ? CRANIOPLASTY FOR CRANIAL DEFECT     ? KNEE JOINT MANIPULATION         SOCIAL HISTORY:  Social History     Tobacco Use   ? Smoking status: Current Every Day Smoker     Packs/day: 0.06     Types: Cigarettes   ? Smokeless tobacco: Never Used   Substance Use Topics   ? Alcohol use: No       FAMILY HISTORY:  Reviewed and noncontributory    ALLERGIES:  Allergies   Allergen Reactions   ? Ketorolac Tromethamine    ? Tramadol    ? Plastic Tape Rash       OUTPATIENT MEDICATIONS:  Prior to Admission medications  Medication Sig Start Date End Date Taking? Authorizing Provider   aspirin 81 mg chewable tablet Chew 81 mg by mouth daily.    [provider]   Atorvastatin Calcium (LIPITOR PO) Take by mouth.    [provider]   LISINOPRIL PO Take by mouth.    [provider]   nitroglycerin 0.4 mg SL tablet Place 0.4 mg under the tongue every five (5) minutes as needed for Chest pain.    [provider]       PHYSICAL EXAM:  BP 112/85  ~ Pulse 71  ~ Temp 37 ?C (98.6 ?F) (Oral)  ~ Resp 19  ~ Ht 1.829 m (6')  ~ Wt 83.9 kg (185 lb)  ~ SpO2 96%  ~ BMI 25.09 kg/m?   General: NAD. Appears well-developed, well-nourished. A&Ox4.  Eyes: PERRL without icterus. No scleral injection.  HENT: Normocephalic, atraumatic. Oropharynx is clear, mucus membranes are moist.  No oral ulcers noted.  Neck: Supple. Trachea midline. No thyromegaly. No JVD.  CV: Regular rate and rhythm, no murmurs or gallops. No dependent lower extremity edema. 2+ DP/PT pulses equal bilaterally  Respiratory: Distant breath sounds. Clear to auscultation bilaterally without wheezing or rhonchi. Normal respiratory effort.  Abdomen: Soft, nontender and nondistended. Bowel sounds are present and normoactive. No palpable masses.  Musculoskeletal: No cyanosis. Extremities are warm and well-perfused. Normal muscle bulk and tone.  Neurologic: CN's II-XII grossly intact. Sensation intact to light touch throughout. Strength 5/5 globally.  Skin: Normal skin turgor. No rashes appreciated.  Lymphatic: No palpable cervical or supraclavicular appreciated.   Psychiatric: Flat affect. Normal mood. No SI/HI.     LABS/STUDIES: Independently reviewed by me on [facility].  Recent Results (from the past 24 hour(s))   ED INFORMATION EXCHANGE    Collection Time: 06/02/19 11:06 AM   Result Value Ref Range    Emer. Dept. Info Exchange - Care Plan      Emer. Dept. Engineer, mining. Dept. Info Exchange - 30 day Visit Count 7     Emer. Dept. Info Exchange - 180 day Visit Count 17    Urea Nitrogen    Collection Time: 06/02/19 11:16 AM   Result Value Ref Range    Urea Nitrogen 17 7 - 22 mg/dL   CBC without differential    Collection Time: 06/02/19 11:16 AM   Result Value Ref Range    White Blood Cell Count 4.52 4.16 - 9.95 x10E3/uL    Red Blood Cell Count 4.86 4.41 - 5.95 x10E6/uL    Hemoglobin 11.4 (L) 13.5 - 17.1 g/dL    Hematocrit 16.1 (L) 38.5 - 52.0 %    Mean Corpuscular Volume 76.5 (L) 79.3 - 98.6 fL    Mean Corpuscular Hemoglobin 23.5 (L) 26.4 - 33.4 pg    MCH Concentration 30.6 (L) 31.5 - 35.5 g/dL    Red Cell Distribution Width-SD 49.6 (H) 36.9 - 48.3 fL    Red Cell Distribution Width-CV 18.6 (H) 11.1 - 15.5 %    Platelet Count, Auto 364 143 - 398 x10E3/uL    Mean Platelet Volume 9.5 9.3 - 13.0 fL    Nucleated RBC%, automated 0.0 No Ref. Range %    Absolute Nucleated RBC Count 0.00 0.00 - 0.00 x10E3/uL   Troponin    Collection Time: 06/02/19 11:16 AM   Result Value Ref Range    Troponin I <0.04 <0.1 ng/mL    Troponin Interpretation Negative Negative  B-type natriuretic peptide (BNP)    Collection Time: 06/02/19 11:16 AM   Result Value Ref Range    BNP 93 <100 pg/mL   Prothrombin Time Panel    Collection Time: 06/02/19 11:16 AM   Result Value Ref Range    Prothrombin Time 13.7 11.5 - 14.4 seconds    INR 1.1 .   Extra Red Top (Plastic)    Collection Time: 06/02/19 11:24 AM   Result Value Ref Range    Extra Tube Performed    Extra Gold Top    Collection Time: 06/02/19 11:24 AM   Result Value Ref Range    Extra Tube Performed    Electrolyte Panel (Na, K, Cl, CO2)    Collection Time: 06/02/19 11:32 AM   Result Value Ref Range    Sodium 142 135 - 146 mmol/L    Potassium 4.1 3.6 - 5.3 mmol/L    Chloride 110 (H) 96 - 106 mmol/L    Total CO2 21 20 - 30 mmol/L    Anion Gap 11 8 - 19 mmol/L   Creatinine    Collection Time: 06/02/19 11:32 AM   Result Value Ref Range    Creatinine 0.68 0.60 - 1.30 mg/dL   Glucose    Collection Time: 06/02/19 11:32 AM   Result Value Ref Range    Glucose 96 65 - 99 mg/dL   ECG, 12 lead    Collection Time: 06/02/19 12:26 PM   Result Value Ref Range    Ventricular Rate 66 BPM    Atrial Rate 67 BPM    P-R Interval 126 ms    QRS Duration 106 ms    Q-T Interval 404 ms    QTC Calculation (Bezet) 424 ms    P Axis 53 degrees    R Axis 60 degrees    T Axis 51 degrees    Diagnosis Sinus rhythm     Diagnosis Normal ECG     Diagnosis         EKG 06/02/2019 (reviewed and interpreted by me):  NSR, normal axis, no ischemic changes      Results for orders placed during the hospital encounter of 06/02/19   XR chest ap portable (1 view)    Narrative XR CHEST AP 1V PORTABLE      CLINICAL HISTORY: cough/dyspnea.    COMPARISON: April 15, 2019.    FINDINGS:    Normal cardiomediastinal silhouette.  Normal lung volumes. No consolidation.  No pleural effusions. No pneumothorax.   No acute osseous abnormalities.      Impression IMPRESSION:      Clear lungs.         Signed by: Barrie Lyme   06/02/2019 12:13 PM     Outside records reviewed.    Outside TTE 11/15/2018:  1. Normal left ventricular systolic function. LV Ejection Fraction is 64 %. Moderate diastolic dysfunction. Features were consistent with a pseudonormal left ventricular filling pattern, with concomitant abnormal relaxation and increased filling pressure.  2. Mildly dilated right ventricle. Mildly depressed right ventricular systolic function.   Right ventricle not well visualized.  3. There is no significant valvular heart disease.  4. PA systolic pressure could not be determined due to the lack of a tricuspid regurgitation Doppler signal.    ASSESSMENT/PLAN:   Earnest Mcgillis is a 49 y.o. male with a history of CAD c/b MI s/p PCI, CVA, HTN, HLD, Tourette's, and psych disorder who is presenting with chest pain.     Problem List:  #  Chest pain. Low suspicion for ACS. Initial troponin negative, EKG normal. CXR with no acute process. D-dimer negative. Pt has frequent ED presentations for same complaint. Query malingering.  # Microcytic anemia.  # Psych disorder, chronic.  # Tobacco use.    Per patient report:  # CAD c/b MI s/p PCI, chronic, stable.  # History of CVA, no residual deficits.  # HTN, chronic, stable.  # HLD, chronic, stable.  # Tourette's.    PLAN:   - Continuous cardiac monitoring  - Trend troponin/EKG to rule-out ACS  - Continue chronic home medications  - Pt advised he needs to establish care with a PMD, Cardiologist, and Psychiatrist  - Outpatient workup of microcytic anemia  - Pt counseled on tobacco cessation  - SW consult      # FEN: NPO pending ACS rule-out. Electrolyte repletion PRN  # PPX: Not indicated, expected short stay  # Code Status: Full Code      Signed:  Salley Scarlet. Cordarryl Monrreal, MD  06/02/2019 at 1:07 PM

## 2019-06-02 NOTE — Nursing Note
[id number] RN break relief. Charge RN admitting patient at [phone] Pt speaking with nurse practitioner re plan of care and medications.  Pt updated that he is still NPO until second trop is resulted and remains on enhanced iso pending covid results.

## 2019-06-02 NOTE — ED Notes
Admit team at bedside, pt is axox4.

## 2019-06-02 NOTE — Nursing Note
1418 Patient arrived from ED to bed 2. Patient a/o x4, on Tele, NSR, patient upset states he wants to eat and wants door to be closed, educated that door must be closed per policy due to COVID test results pending. Also educated on reason for NPO status, waiting for next Trop blood draw at 1700. Refused CHG bath at moment stated ''will clean up later''  1425 Patient c/o 7/10 chest pain, medicated with nitroglycerin 0.4 mg SL x1, after 5 min patient stated it ''does not work, it did not work in the ED''. Patient declined additional nitroglycerin doses and tylenol. Notified NP Desma Maxim. Endorse report to covering break nurse Thurston Hole.

## 2019-06-02 NOTE — Consults
SW consulted due to concern for homelessness.     Chart review completed. Patient has previously been assessed by SW for similar concern and the patient has historically denied being homeless. CSW met with patient at bedside. Patient A&Ox4, sitting up right in bed watching television. SW informed patient that the reason for the visit is for discharge preporation. Patient states that he lives at [address] [place] [place] [address]. Patient reports that he took the [facility] to [place]. Patient reports that he will likely need assistance with transportation back home. CSW brainstormed transportation options with the patient as SW does not have resources to assist the patient with transportation to [facility]. Patient states that he will plan to take the [employer] back. SW offered to assist by printing scheduled transportation times and the patient declined. Patient has an iPad at bedside and reports the will use the iPad to find pick up times. Patient declined further SW interventions.       Umaima Scholten Evalee Mutton, MPH  Clinical Social Worker III   Pager: [phone]  Phone: [phone]

## 2019-06-03 ENCOUNTER — Telehealth: Payer: Medicaid HMO

## 2019-06-03 LAB — Drugs of Abuse Scn,Ur: AMPHETAMINE/METHAMPHETAMINE: NEGATIVE ng/mL

## 2019-06-03 LAB — Troponin I: TROPONIN I: <0.04 ng/mL (ref <0.1)

## 2019-06-03 LAB — Hgb A1c: HGB A1C - HPLC: 5.6 (ref <5.7)

## 2019-06-03 LAB — Lipid Panel: TRIGLYCERIDES: 64 mg/dL (ref <150)

## 2019-06-03 NOTE — Nursing Note
1910: Patient will be discharged via [facility]. Will call and coordinate transport.  1925: Patient is a homeless male with discharge orders and needs an [place] ride to [place] on [address] [phone]. He needs to go there for a money transfer that his aunt is sending to that [employer]. Paged and asked Curly Shores, Care Coordinator for assistance.  1934:  Curly Shores, Care Coordinator phoned and stated that he will work on his [employer]etto Goad ride to his desired location.  1938: Discharge instruction and education given to the patient.  All questions and concerns addressed at this time.   1947: Curly Shores, Care Coordinator phoned. Benedetto Goad will be at the hospital ED entrance. The [employer]etto Goad driver's names is Elita Quick and he is driving a silver [facility] with the last three plate numbers: [id number].  2000: Patient has been discharged. Patient ambulated to the hospital ED entrance with CCP. All questions and concerns addressed at this time. All belongings with the patient.

## 2019-06-03 NOTE — ED Notes
Homeless Discharge Coordinator  Department of Care Coordination  Phone #: [phone]  Pager #: [phone]        Received a Call from RN Helina advising patient is Homeless and in need of Transportation. [employer] Set up to take patient to the following address:    [address] [place], [place] [address]    Patient stated his Aunt had deposit money for him and he will be able to retrieve it from the [place] at the address above. Patient stated he will use that money to go back to [facility].           Curly Shores [facility] Discharge Coordinator   Department of Care Coordination  E-mail:[email or web address]   Phone #: [phone]  Pager #: [phone]

## 2019-06-03 NOTE — Telephone Encounter
Good morning,    Patient [name] [name] [facility] [id number]    Is the patient being discharged to SNF? No  Clinic/Test/Procedure to be scheduled:f/u with PCP  Doctor Preferences (If applicable):   Time Frame: 1 week  Reason for Appointment/Procedure: recent discharge from hospital for atypical chest pain    PCP  [facility]  ? Per clinic at # [phone], patient would need to call them for PCP assignment, patient not assigned to a specific PCP yet.    Patient has [employer] and assigned to [facility].    Called patient 2x at # [phone] but no answer, phone number not in service. I will try again later.    Thank You    Rogue Jury  [facility] Office  P [phone]  F [phone]

## 2019-06-03 NOTE — Discharge Summary
Discharge Summary           Name: [name] [name] MRN: [id number] DOB: 1970/05/17   Admit Date:   06/02/2019   D/C Date: 06/02/2019 LOS:    AdmittingAttending Salley Scarlet. Cristopher Ciccarelli, MD Discharge Attending Lakin Romer, Salley Scarlet., MD    PCP Aviva Kluver, MD Discharge Provider Salley Scarlet. Kye Silverstein, MD     Inpatient Treatment Team Treatment Team:   Team: Observation, Medicine -   Consults Social Work   Outpatient Care Team No care team member to display     Admission Diagnosis (reason for admission) Discharge Diagnosis (conditions treated during hospitalization)   Past Medical History:   Diagnosis Date   ? CHF (congestive heart failure) (HCC/RAF)    ? Coronary artery disease    ? Heart attack (HCC/RAF)    ? Hyperlipidemia    ? Hypertension    ? Psychiatric disorder    ? Stroke (HCC/RAF)    ? TIA (transient ischemic attack)     # Chest pain (POA)  # Microcytic anemia (POA)  # Tobacco use (POA)     Disposition Discharge Condition   Home or Self care    stable     HPI:   [name] [name] is a 49 y.o. male with a history of CAD c/b MI s/p PCI, CVA, HTN, HLD, Tourette's, and psych disorder who is presenting with chest pain. The patient states he was visiting a friend at the [facility] and when he was walking up an incline he began to have left sided sharp pressure-like chest pain. Pain is 6-7/10. Associated with shortness of breath and lightheadedness. Denies F/C, palpitations, cough, N/V, abdominal pain, urinary symptoms, and diarrhea. Pt states pain improved with nitroglycerin. Pt states he had an MI 6 years ago, at which time he had a stent placed. 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 He currently lives in [place] but was visiting a friend in [place] and has been staying at a hostel in [place]. Pt is a current daily smoker. Pt endorses marijuana use, otherwise denies illicit drug use.   Per review of Care Everywhere, pt has had multiple ED visits at [facility] and [facility] for complaints of chest pain and neuro complaints.    Hospital Course:    The patient was admitted to the medicine observation service for further management. ACS was rule-out. No e/o CHF. CXR with no acute process. D-dimer negative. Pt has frequent ED presentations for same complaint. Query malingering. Pt denies being homeless. Pt noted to have microcytic anemia, which should be worked up as outpatient. Pt advised that he needs to establish care with a PMD, Cardiologist, and Psychiatrist. SW consulted- pt requested assistance with transportation, otherwise denies other resources. On day of discharge the patient was afebrile, hemodynamically stable, satting well on room air, tolerating po, and ambulating without difficulty. Return precautions were given.      Procedures & Operations Performed: None    Relevant Imaging Studies (most recent results only):     CXR:  Clear lungs    Relevant labs:   BMP:   Lab Results   Component Value Date/Time    NA 142 06/02/2019 11:32 AM    K 4.1 06/02/2019 11:32 AM    CL 110 (H) 06/02/2019 11:32 AM    CO2 21 06/02/2019 11:32 AM    BUN 17 06/02/2019 11:16 AM    CREAT 0.68 06/02/2019 11:32 AM    CALCIUM 8.8 04/15/2019 11:24 AM  GLUCOSE 96 06/02/2019 11:32 AM       CBC:   Lab Results   Component Value Date/Time    WBC 4.52 06/02/2019 11:16 AM    HGB 11.4 (L) 06/02/2019 11:16 AM    HCT 37.2 (L) 06/02/2019 11:16 AM    PLT 364 06/02/2019 11:16 AM          Physical Exam: BP 125/79  ~ Pulse 61  ~ Temp 36.8 ?C (98.2 ?F) (Oral)  ~ Resp 12  ~ Ht 1.829 m (6')  ~ Wt 83.9 kg (185 lb)  ~ SpO2 95%  ~ BMI 25.09 kg/m?     General: NAD. A&Ox4.  Eyes: PERRL without icterus. No scleral injection.  HENT: Normocephalic, atraumatic. Oropharynx is clear, mucus membranes are moist.  No oral ulcers noted.  Neck: Supple. Trachea midline.  CV: Regular rate and rhythm, no murmurs or gallops. No dependent lower extremity edema. 2+ DP/PT pulses equal bilaterally  Respiratory: Distant breath sounds. Clear to auscultation bilaterally without wheezing or rhonchi. Normal respiratory effort.  Abdomen: Soft, nontender and nondistended. Bowel sounds are present and normoactive.   Musculoskeletal: No cyanosis. Extremities are warm and well-perfused. Normal muscle bulk and tone.  Neurologic: No focal deficits.  Skin: Normal skin turgor. No acute rashes.      Outpatient Provider To-Do List   - Establish PMD, Cardiologist, and Psychiatrist  - Work-up of microcytic anemia     Pending labs   None   Discharge medications High-risk medications      Medication List      CONTINUE taking these medications    aspirin 81 mg chewable tablet     LIPITOR PO     LISINOPRIL PO     nitroglycerin 0.4 mg SL tablet  Commonly known as: Nitrostat         This patient does not have coumadin on their discharge med list    No controlled substances on discharge med list     Nutrition recommendations & malnutrition assessment   The patient was not consulted or assessed by a Registered Dietitian (RD) during this admission.         Discharge diet orders   Low salt, low fat diet     Rehab assessment   No PT evaluation this encounter             Discharge activity orders    Activity as tolerated   As directed           Requested appointments Scheduled appointments    We will schedule follow up appointment(s) and call you with appointment   time(s)   As directed      Is the patient being discharged to SNF? No  Clinic/Test/Procedure to be scheduled:f/u with PCP  Doctor Preferences (If applicable):   Time Frame: 1 week  Reason for Appointment/Procedure: recent discharge from hospital for   atypical chest pain       No future appointments.     Case Manager/Home Health assessment                             Home Health orders   There are no active discharge home health orders for this encounter.      Goals of Care Note (if new for this encounter)         LACE+ Risk Stratification    Total Score Risk Stratification   0-28 Minimal Risk   29-58 Moderate Risk  59-78 High Risk   79-90 Highest Risk Readmission Score            37       Total Score        3 Male Patient    15 Urgent Admission    3 ED Visits in Previous 6 Months    16 Charlson Score (by age & number of urgent admissions)        Criteria that do not apply:    Discharge Institution    Length of Stay    Alternative Level of Care Status    Elective Admission in Previous Year          Zavannah Deblois K. Hector Venne, MD  06/02/2019 6:33 PM

## 2019-06-03 NOTE — Other
Patients Clinical Goal:   Clinical Goal(s) for the Shift: chest pain free, safety and comfort  Identify possible barriers to advancing the care plan: none  Stability of the patient: Moderately Stable - low risk of patient condition declining or worsening   End of Shift Summary: Received Pt from ED.  Pt AOx4.  Pt on cardiac monitoring, NSR.  Pt c/o chest pain, given nitroglycerin x 1.  Per Pt did not work.  Trop negative x 2 and EKG performed.  Pt has discharge orders.  Will endorse care to oncoming RN.

## 2019-06-05 DIAGNOSIS — I10 Essential (primary) hypertension: Secondary | ICD-10-CM

## 2019-06-05 DIAGNOSIS — I2510 Atherosclerotic heart disease of native coronary artery without angina pectoris: Secondary | ICD-10-CM

## 2019-06-05 DIAGNOSIS — E785 Hyperlipidemia, unspecified: Secondary | ICD-10-CM

## 2019-06-05 DIAGNOSIS — F99 Mental disorder, not otherwise specified: Secondary | ICD-10-CM

## 2019-06-05 DIAGNOSIS — D509 Iron deficiency anemia, unspecified: Secondary | ICD-10-CM

## 2019-06-05 DIAGNOSIS — R079 Chest pain, unspecified: Secondary | ICD-10-CM

## 2019-07-07 LAB — HEMOGLOBIN A1C - EXTERNAL
Estimated Average Glucose: 128 mg/dL — NL
Hemoglobin A1c: 6.1 % — ABNORMAL HIGH (ref <5.7)

## 2019-07-18 ENCOUNTER — Emergency Department
Admission: EM | Admit: 2019-07-18 | Discharge: 2019-07-19 | Disposition: A | Discharge status: Home / Self Care | Payer: MEDICAID | Attending: Emergency Medicine | Admitting: Emergency Medicine

## 2019-07-18 DIAGNOSIS — Z8249 Family history of ischemic heart disease and other diseases of the circulatory system: Secondary | ICD-10-CM | POA: Insufficient documentation

## 2019-07-18 DIAGNOSIS — Z888 Allergy status to other drugs, medicaments and biological substances status: Secondary | ICD-10-CM | POA: Insufficient documentation

## 2019-07-18 DIAGNOSIS — Z885 Allergy status to narcotic agent status: Secondary | ICD-10-CM | POA: Insufficient documentation

## 2019-07-18 DIAGNOSIS — R45851 Suicidal ideations: Secondary | ICD-10-CM | POA: Insufficient documentation

## 2019-07-18 DIAGNOSIS — Z7982 Long term (current) use of aspirin: Secondary | ICD-10-CM | POA: Insufficient documentation

## 2019-07-18 DIAGNOSIS — Z139 Encounter for screening, unspecified: Secondary | ICD-10-CM

## 2019-07-18 DIAGNOSIS — I10 Essential (primary) hypertension: Secondary | ICD-10-CM | POA: Insufficient documentation

## 2019-07-18 DIAGNOSIS — I252 Old myocardial infarction: Secondary | ICD-10-CM | POA: Insufficient documentation

## 2019-07-18 DIAGNOSIS — T402X2A Poisoning by other opioids, intentional self-harm, initial encounter: Secondary | ICD-10-CM | POA: Insufficient documentation

## 2019-07-18 DIAGNOSIS — F209 Schizophrenia, unspecified: Secondary | ICD-10-CM | POA: Insufficient documentation

## 2019-07-18 DIAGNOSIS — F17210 Nicotine dependence, cigarettes, uncomplicated: Secondary | ICD-10-CM | POA: Insufficient documentation

## 2019-07-18 DIAGNOSIS — Z79899 Other long term (current) drug therapy: Secondary | ICD-10-CM | POA: Insufficient documentation

## 2019-07-18 LAB — CBC WITH DIFF, BLOOD
ANC-Automated: 3.6 10*3/uL (ref 1.6–7.0)
Abs Basophils: 0 10*3/uL
Abs Eosinophils: 0.1 10*3/uL (ref 0.0–0.5)
Abs Lymphs: 1.4 10*3/uL (ref 0.8–3.1)
Abs Monos: 0.5 10*3/uL (ref 0.2–0.8)
Basophils: 0 %
Eosinophils: 3 %
Hct: 36.7 % — ABNORMAL LOW (ref 40.0–50.0)
Hgb: 11.7 gm/dL — ABNORMAL LOW (ref 13.7–17.5)
Lymphocytes: 25 %
MCH: 24.3 pg — ABNORMAL LOW (ref 26.0–32.0)
MCHC: 31.9 g/dL — ABNORMAL LOW (ref 32.0–36.0)
MCV: 76.3 um3 — ABNORMAL LOW (ref 79.0–95.0)
MPV: 9 fL — ABNORMAL LOW (ref 9.4–12.4)
Monocytes: 9 %
Plt Count: 342 10*3/uL (ref 140–370)
RBC: 4.81 10*6/uL (ref 4.60–6.10)
RDW: 19 % — ABNORMAL HIGH (ref 12.0–14.0)
Segs: 63 %
WBC: 5.6 10*3/uL (ref 4.0–10.0)

## 2019-07-18 LAB — COMPREHENSIVE METABOLIC PANEL, BLOOD
ALT (SGPT): 15 U/L (ref 0–41)
AST (SGOT): 17 U/L (ref 0–40)
Albumin: 4.3 g/dL (ref 3.5–5.2)
Alkaline Phos: 99 U/L (ref 40–129)
Anion Gap: 11 mmol/L (ref 7–15)
BUN: 14 mg/dL (ref 6–20)
Bicarbonate: 24 mmol/L (ref 22–29)
Bilirubin, Tot: 0.21 mg/dL (ref <1.2)
Calcium: 8.8 mg/dL (ref 8.5–10.6)
Chloride: 107 mmol/L (ref 98–107)
Creatinine: 0.77 mg/dL (ref 0.67–1.17)
GFR: >60 mL/min
Glucose: 129 mg/dL — ABNORMAL HIGH (ref 70–99)
Potassium: 3.8 mmol/L (ref 3.5–5.1)
Sodium: 142 mmol/L (ref 136–145)
Total Protein: 6.7 g/dL (ref 6.0–8.0)

## 2019-07-18 LAB — SALICYLATES, SERUM: Salicylates: <1 mg/dL (ref 15–30)

## 2019-07-18 LAB — ACETAMINOPHEN, BLOOD: Acetaminophen: <5 ug/mL — ABNORMAL LOW (ref 10–30)

## 2019-07-18 NOTE — EMS Narrative (Addendum)
Alert:  [id number]  [id number]  [id number]  M7  ALS-Paramedic  [address]  [id number]  [phone]    CC:  2021-05-03T17:33:51.000-07:00    Impression:  [place] Si)    HPI:Pt Age: 49 Years; Gender: Male;Medical History: Hypertension (HTN),   Blood: Hyperlipidemia (high cholesterol), Chest pain, unspecified-R07.9,   Other chest pain-R07.89, Allergy status to analgesic agent status-Z88.6,   Tobacco use-Z72.0, Suicidal ideations-R45.851, Nausea-R11.0, Athscl heart   disease of native coronary artery w/o ang pctrs-I25.10, Presence of   coronary angioplasty implant and graft-Z95.5, Prsnl hx of TIA (TIA), and   cereb infrc w/o resid deficits-Z86.73, Syncope and collapse-R55, 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, Fever, unspecified-R50.9, Nicotine dependence,   cigarettes, uncomplicated-F17.210, Malingerer [conscious   simulation]-Z76.5, Generalized abdominal pain-R10.84, Iron deficiency   anemia, unspecified-D50.9; Pt Medication: Aspirin 81 MG Enteric Coated   Tablet, Lisinopril 10 Mg Or Tabs, Aspirin 81 Mg Or Chew, atorvastatin 20 MG   Oral Tablet, Nitroglycerin 0.4 Mg Sl Subl, Tetrahydrozoline hydrochloride   0.5 MG/ML / Zinc Sulfate 2.5 MG/ML Ophthalmic Solution; Pt Allergies:   Diphenhydramine, Ketorolac, Tramadol, ;    Medical History:  I10 - Essential (primary) hypertension  R45.851 - Suicidal ideations  Z95.5 - Presence of coronary angioplasty implant and graft  R07.89 - Other chest pain  Z76.5 - Malingerer [conscious simulation]  Z86.73 - Personal history of transient ischemic attack (TIA), and cerebral   infarction without residual deficits  Z72.0 - Tobacco use  I25.2 - Old myocardial infarction  R50.9 - Fever, unspecified  R55 - Syncope and collapse  R11.0 - Nausea  D50.9 - Iron deficiency anemia, unspecified  R07.9 - Chest pain, unspecified  I25.10 - Atherosclerotic heart disease of native coronary artery without   angina pectoris  R10.84 - Generalized abdominal pain  Z88.6 - Allergy status to analgesic  agent  F17.210 - Nicotine dependence, cigarettes, uncomplicated  E78.5 - Hyperlipidemia, unspecified    Allergies to Medications:  3498 - diphenhydramine  29528 - tramadol  35827 - ketorolac    Environmental and Food Allergies:    Current Medications:  413244 - aspirin 81 MG Delayed Release Oral Tablet  314076 - lisinopril 10 MG Oral Tablet  010272 - aspirin 81 MG Chewable Tablet  617310 - atorvastatin 20 MG Oral Tablet  536644 - nitroglycerin 0.4 MG Sublingual Tablet  0347425 - tetrahydrozoline hydrochloride 0.5 MG/ML / zinc sulfate 2.5 MG/ML   Ophthalmic Solution    Assessment:    Cardiac Arrest:  No    EMS Airway:    EMS Medications:    EMS Procedures:  Date/Time Procedure Performed: 2021-05-03T17:45:00.000-07:00  Procedure: [id number] - Ambulatory continuous glucose monitoring of   interstitial tissue fluid  Date/Time Procedure Performed: 2021-05-03T17:45:00.000-07:00  Procedure Performed Prior to this Unit's EMS Care: No  Procedure: [id number] - 3 lead electrocardiographic monitoring  Date/Time Procedure Performed: 2021-05-03T17:45:00.000-07:00  Procedure Performed Prior to this Unit's EMS Care: No  Procedure: [id number] - Pulse oximetry  Date/Time Procedure Performed: 2021-05-03T17:45:00.000-07:00  Procedure: [id number] - Digital respired carbon dioxide monitoring  Date/Time Procedure Performed: 2021-05-03T17:50:00.000-07:00  Procedure Performed Prior to this Unit's EMS Care: No  Procedure: [id number] - Catheterization of vein  Vascular Access Location: Forearm-Right    EMS Exam:    Patient Demographics:  Last Name: [name]  First Name: [name] [name]  Patient's Home Address: [name]  Patient's Home [place]: [id number]  Patient's Home [place]: [phone]  Patient's Home State: 06  Patient's Home ZIP Code: [id number]  Patient's Country of Residence: [place]  Gender: Male  Race: White  Age: 40  Age Units: Years    Demographics Practitioner:    Advanced Directives:    Demographics Payment:    Incident Times:  Unit Notified by Dispatch  Date/Time: 2021-05-03T17:26:12.000-07:00  Unit En Route Date/Time: 2021-05-03T17:26:26.000-07:00  Unit Arrived on Scene Date/Time: 2021-05-03T17:33:56.000-07:00  Arrived at Patient Date/Time: 2021-05-03T17:33:48.000-07:00  Unit Left Scene Date/Time: 2021-05-03T17:46:26.000-07:00  Patient Arrived at Destination Date/Time: 2021-05-03T17:58:36.000-07:00

## 2019-07-18 NOTE — ED Provider Notes (Signed)
Emergency Department Note  [facility] electronic medical record reviewed for pertinent medical history.     Nursing Triage Note:   Chief Complaint   Patient presents with   . Suicidal Thinking     Pt [facility] from [facility] for SI. Pt initially voluntary at [facility] report he took morphine to kill himself and was sent here on a 5150. Pt alert/oriented, calm/cooperative.        HPI:   49 year old male with a PMH significant for schizophrenia and multiple suicide attempts who presents as a transfer from [facility] for evaluation after attempted morphine overdose.  He reports that he took 5 5 mg morphine pills and then presented voluntarily to [facility] after which point he was transferred to [facility] ER for medical clearance.     He states that he constantly hears voices telling him to kill his father who is incarcerated in [place] (reportedly as retrubution for killing his mother when he was a child).  He denies any visual hallucinations and is not responding to internal stimuli.  Reports that he has had numerous previous suicide attempts.  He says that he has tried every psychiatric medication and was able to list them, including clozapine, none of which have helped with his symptoms.  He does tell me that he has a plan to blow up the not the [facility], though denies wanting to hurt anybody.  He says that he would wanted to be empty first just wants to blow up the building because of how he was treated there while he was admitted years ago.  Police report filed as described below.    Past Medical History:   Diagnosis Date   . ACS (acute coronary syndrome) (CMS-HCC)    . Heart murmur    . Hypertension    . Myocardial infarction        Past Surgical History:   Procedure Laterality Date   . back surgery     . knee surgery     . surgery to the skull  from fracture       Family History:    Family History   Problem Relation Name Age of Onset   . Heart Attack Father       Social History:    Social History     Tobacco Use   . Smoking status: Current  Every Day Smoker     Packs/day: 0.25     Years: 25.00     Pack years: 6.25   . Smokeless tobacco: Never Used   Substance Use Topics   . Alcohol use: No   . Drug use: Not on file       Medications:   Prior to Admission Medications   Prescriptions Last Dose Informant Patient Reported? Taking?   aspirin 81 MG chewable tablet   No No   Sig: Take 1 tablet by mouth daily.   atorvastatin (LIPITOR) 20 MG tablet   No No   Sig: Take 1 tablet by mouth every evening.   lisinopril (PRINIVIL, ZESTRIL) 10 MG tablet   Yes No   Sig: Take 10 mg by mouth nightly.      Facility-Administered Medications: None       Allergies: Toradol and Tramadol    Review of Systems Positives in bold  Constitutional: diaphoresis, chills and fever.   HENT: congestion and rhinorrhea.    Respiratory: cough and shortness of breath.    Cardiovascular: chest pain, palpitations and leg swelling.   Gastrointestinal: abdominal distention,  abdominal pain, constipation, diarrhea, nausea and vomiting.   Skin: rash and wound.   Neurological: dizziness, seizures, syncope, weakness, light-headedness and headaches.   Psychiatric/Behavioral: SI/SA, AVH, confusion and sleep disturbance.     Physical Exam:   07/18/19  1944 07/18/19  2115 07/19/19  0035   BP: 116/75  120/76   Pulse: 74  72   Resp: 17  18   Temp: 97.1 F (36.2 C)     SpO2: 97% 97% 98%       Nursing note and vitals reviewed. Pt not hypoxic.  Gen: Patient is in NAD, non-toxic appearing, cooperative, A/Ox4  HEENT: EOMI, MMM, no conjunctival injection, b/l sclera anicteric. Pupils 3-36mm bilaterally and reactive.  Cardiovascular: RRR no m/r/g, normal heart sounds  Pulmonary/Chest: CTAB, no increased WOB, no respiratory distress, no wheezes/rhonchi/rales. Respiratory rate 17.  Abdominal: Soft. +BS, no r/g, no masses, ND/NT  Extr/MSK: Well perfused, distal pulses intact. No tenderness. FROM.   Neuro: No evidence of facial droop, normal speech, mentation appropriate  Psychiatric: Normal affect. Mood not labile  nor depressed.     Impression & Initial ED Plan:  49 year old  male presents for medical clearance after attempted suicide attempt with 25 mg of p.o. morphine as described above.  Patient does endorse ongoing SI.  Patient reported that he wanted to blow up the not the [facility] as well as to hurt his father who is reportedly incarcerated in [place].  [facility] police notified and in-person report given to officer.  Patient not actively responding to internal stimuli.  He was calm and cooperative throughout his ED stay.    In terms of medical clearance, patient is fully alert and oriented with normal vitals any normal respiratory rate.  He does not exhibit an opioid overdose toxidrome.  His pupils are normal and reactive bilaterally.  The remainder of his medical screening exam was negative.  Screening labs were obtained as described above which were negative for acute electrolyte abnormalities, significant anemia, or leukocytosis.  Acetaminophen and salicylates negative.  No further intervention or workup indicated this time.  Patient stable for transfer back to [facility].    I have discussed my evaluation and care plan for the patient with the attending physician Dr. Eliberto Ivory.    [place] Searles Royston Cowper, MD  Emergency Medicine, PGY-1  [facility]       [place], Andreas Ohm, MD  Resident  07/19/19 6811       Rennis Petty, MD  07/21/19 [phone]

## 2019-07-18 NOTE — Discharge Instructions (Signed)
Patient is stable for transfer back to [facility]

## 2019-07-18 NOTE — ED Provider Notes (Signed)
49 year old male with extensive psychiatric history presents for SI, voluntary to [facility], here for medical clearance. Patient endorses taking 5 morphine tablets, 5 mg each with the intention to die. He expresses feeling very suicidal and that life is not worth living with his depressive symptoms.    On exam, restricted affect. Alert, oriented, answers questions appropriately. Endorses SI.     Patient medically cleared, acetaminophen level undetectable. Appropriate for transfer back to [facility].     Rennis Petty, MD  07/18/19 [phone]

## 2019-07-18 NOTE — ED Notes (Signed)
MD at bedside. 

## 2019-07-18 NOTE — ED Notes (Signed)
Pt provided w/ food and milk. Pt calm and cooperative at this time. RR even/unlabored w/ symmetrical chest expansion. Sitter at [employer] within [employer] of sight. Safety and comfort measures addressed. Will continue to monitor.

## 2019-07-18 NOTE — ED Notes (Signed)
Bed: 14A  Expected date:   Expected time:   Means of arrival:   Comments:  ETOH

## 2019-07-19 LAB — HCV ANTIBODY WITH REFLEX QUANT: Hepatitis C Ab: NONREACTIVE

## 2019-07-19 NOTE — ED Notes (Addendum)
Discharge instructions reviewed with pt. Pt instructed to follow up care and return if acute change in status. Pt verbalizes understanding and denies further questions at this time. AVS signed by pt and this RN and placed in admission bin. Pt  out of ED w/ EMT transport back to [facility]. No PIV in place upon d/c. Sehili and white checkered armband removed.     RN to RN report given to [place] at [facility].

## 2019-08-19 LAB — PROTHROMBIN TIME CARE EVERYWHERE
INR CARE EVERYWHERE: 1 (ref 0.8–1.2)
PROTHROMBIN TIME CARE EVERYWHERE: 13.1 s (ref 12.5–15.0)

## 2019-08-20 LAB — UNMAPPED LAB RESULTS
ALT/SGPT (EXT): 21 U/L (ref <=65)
AST/SGOT (EXT): 19 U/L (ref <=38)
Albumin (EXT): 4.1 g/dL (ref 2.8–4.7)
Alkaline Phosphatase (EXT): 107 U/L (ref <=138)
Bilirubin, Direct (EXT): <0.1 mg/dL (ref <=0.3)
Bilirubin, Total (EXT): 0.2 mg/dL (ref <=1.0)
Protein (EXT): 7.5 g/dL (ref 6.0–8.7)

## 2019-08-20 LAB — LIPID PROFILE (EXT)
Chol/HDL Ratio (EXT): 2.5 (ref 1.5–4.0)
Cholesterol (EXT): 99 mg/dL (ref <=200)
HDL Cholesterol (EXT): 40 mg/dL (ref >=40)
LDL Cholesterol (EXT): 50 mg/dL (ref <=100)
NON HDL Cholesterol (EXT): 59 mg/dL (ref <=130)
Triglycerides (EXT): 63 mg/dL (ref <=150)

## 2019-08-20 LAB — HEMOGLOBIN A1C CARE EVERYWHERE
ESTIMATED AVERAGE GLUCOSE CARE EVERYWHERE: 114 mg/dL (ref <=116)
HEMOGLOBIN A1C CARE EVERYWHERE: 5.6 % (ref 4.8–5.6)

## 2019-08-20 LAB — PROTHROMBIN TIME CARE EVERYWHERE
INR CARE EVERYWHERE: 1 (ref 0.8–1.2)
PROTHROMBIN TIME CARE EVERYWHERE: 13.9 s (ref 12.5–15.0)

## 2019-11-24 LAB — TYPE AND SCREEN CARE EVERYWHERE
ANTIBODY SCREEN CARE EVERYWHERE: NEGATIVE
RH TYPE CARE EVERYWHERE: POSITIVE

## 2019-11-24 LAB — APTT CARE EVERYWHERE: APTT CARE EVERYWHERE: 31 s (ref 24–34)

## 2019-11-24 LAB — PROTHROMBIN TIME CARE EVERYWHERE
INR CARE EVERYWHERE: 1 (ref 0.8–1.1)
PROTHROMBIN TIME CARE EVERYWHERE: 13.6 s (ref 11.9–14.6)

## 2019-11-25 LAB — APTT CARE EVERYWHERE: APTT CARE EVERYWHERE: 32 s (ref 24–34)

## 2019-11-25 LAB — PROTHROMBIN TIME CARE EVERYWHERE
INR CARE EVERYWHERE: 1.1 (ref 0.8–1.1)
PROTHROMBIN TIME CARE EVERYWHERE: 14 s (ref 11.9–14.6)

## 2019-11-25 LAB — HEMOGLOBIN A1C CARE EVERYWHERE
ESTIMATED AVERAGE GLUCOSE: 134 mg/dL
HEMOGLOBIN A1C CARE EVERYWHERE: 6.3 % — ABNORMAL HIGH (ref 4.2–5.6)

## 2019-12-26 LAB — COVID-19 CARE EVERYWHERE: COVID-19 RAPID CARE EVERYWHERE: NEGATIVE

## 2020-01-07 ENCOUNTER — Ambulatory Visit: Payer: Medicaid HMO

## 2020-01-07 ENCOUNTER — Ambulatory Visit: Payer: MEDICAID

## 2020-01-07 ENCOUNTER — Inpatient Hospital Stay
Admit: 2020-01-07 | Discharge: 2020-01-08 | Disposition: A | Discharge status: Home / Self Care | Payer: Medicaid HMO | Source: Home / Self Care | Attending: Emergency Medical Services

## 2020-01-07 DIAGNOSIS — S0990XA Unspecified injury of head, initial encounter: Secondary | ICD-10-CM

## 2020-01-07 DIAGNOSIS — W19XXXA Unspecified fall, initial encounter: Secondary | ICD-10-CM

## 2020-01-07 DIAGNOSIS — R42 Dizziness and giddiness: Secondary | ICD-10-CM

## 2020-01-07 LAB — CBC: PLATELET COUNT, AUTO: 321 10*3/uL (ref 143–398)

## 2020-01-07 LAB — CREATININE: CREATININE: 0.83 mg/dL (ref 0.60–1.30)

## 2020-01-07 LAB — Prothrombin Time Panel: PROTHROMBIN TIME: 12.8 s (ref 11.5–14.4)

## 2020-01-07 LAB — Alcohol,Ethyl: ALCOHOL,ETHYL: <15 mg/dL (ref <15)

## 2020-01-07 LAB — Extra Lavender Top

## 2020-01-07 LAB — Electrolyte Panel: SODIUM: 139 mmol/L (ref 135–146)

## 2020-01-07 LAB — Glucose, Whole Blood: GLUCOSE, WHOLE BLOOD: 91 mg/dL (ref 65–99)

## 2020-01-07 LAB — Extra Light Green Top

## 2020-01-07 LAB — Extra Light Blue Top

## 2020-01-07 NOTE — ED Notes
COLLECTIVE?NOTIFICATION?01/07/2020 16:10?[name], [name] [name]?MRN: [id number]    Criteria Met      2 Visits in 30 Days    6 Visits in 180 Days    Security and Safety  No recent Security Events currently on file    ED Care Guidelines  There are currently no ED Care Guidelines for this patient. Please check your facility's medical records system.        Prescription Drug Report (12 Mo.)  PDMP query found no report.      E.D. Visit Count (12 mo.)  Facility Visits   [facility] 1   [facility] 1   [facility] 1   [facility] 1   [facility] 2   [facility] 1   [place] [facility] 1   [facility] 1   [facility] 1   [facility] 1   [facility] 1   [facility] 1   [facility] 2   [facility] 1   [facility] 1   [facility] 1   [facility] 2   [facility] 1   [facility] 1   [facility] 1   [facility] 1   [facility] 1   [facility] 2   [facility] 1   [facility] 1   [facility] 1   [facility] 1   [facility] 1   [place] [facility] [place] [facility] [place] 1   [facility] 2   [facility] 1   [facility] 2   [facility] 1   [facility] 1   [place] [facility] [place] (CCD Exch[facility] 1   [facility] 1   [facility] 1   [facility] 1   [facility] 2   [facility] 1   Total 47   Note: Visits indicate total known visits.     Recent Emergency Department Visit Summary  Showing 10 most recent visits out of 46 in the past 12 months  Date Facility [facility] Type Diagnoses or Chief Complaint   Jan 07, 2020 [name] [name] [name]     Dec 09, 2019 [facility]. [place]. [place] Emergency     Dec 05, 2019 [facility]. [place]. [place] Emergency      1. Chest pain, unspecified      2. Chest pain      Nov 30, 2019 [facility]. [place]. [place] Emergency      1. head ache      1. Headache, unspecified      2. Headache      Nov 28, 2019 [facility] [place] Emergency      0. Chest pain, unspecified      0. CHEST PAIN      1. Cervicalgia      2. Nicotine dependence, cigarettes, uncomplicated      2. 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 Pure  hypercholesterolemia, unspecified      2. Shortness of breath      2. Personal history of transient ischemic attack (TIA), and cerebral infarction without residual deficits      2. Essential (primary) hypertension      2. Intercostal pain      Nov 15, 2019 [facility] [place] Emergency      0. ABD PAIN      1. Other chest pain      2. Acidosis      2. Right lower quadrant pain      2. Anemia, unspecified      2. Allergy status to analgesic agent      2. 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 Contact with and (suspected) exposure to      2. Nausea      2. Allergy status to narcotic agent      Nov 15, 2019 [name] [name]. [facility] [place] Emergency      1. Chest pain, unspecified      2. Chest pain      3. Nausea      Nov 14, 2019 [name] [name] [place] Emergency      1. CP, left sided. Onset 1 hour ago.      1. Other chest pain      2. Chest pain      Nov 13, 2019 [facility]. [place]. [place] Emergency     Nov 05, 2019 [facility] [place] Emergency  Chief Complaint: CHEST PAIN        Recent Inpatient Visit Summary  Date Facility [facility] Type Diagnoses or Chief Complaint   Dec 09, 2019 [facility] [place]. [place] Inpatient      1. Syncope and collapse      2. Essential (primary) hypertension      2. Personal history of transient ischemic attack (TIA), and cerebral infarction without residual deficits      2. Allergy status to narcotic agent      2. Atherosclerotic heart disease of native coronary artery without angina pectoris      2. Nicotine dependence, cigarettes, uncomplicated      2. Other long term (current) drug therapy      2. 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 Long term (current) use of aspirin      2. CONTACT WITH AND (SUSPECTED) EXPOSURE TO COVID-19      Nov 16, 2019 [facility]      0. Headache, unspecified      0. STROKE CHEST PAIN      1. Cerebral infarction, unspecified      2. 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 Other long term (current) drug therapy      2. Hemiplegia and hemiparesis following cerebral infarction affecting left non-dominant side      2. Contact with and (suspected) exposure to      2. Hyperlipidemia, unspecified      2. NIHSS score 8      2. Long term (current) use of aspirin      Oct 30, 2019 [name] [name]. - [facility] Peds ED [place]. [place] Neurology      CVA      1. Cerebral infarction due to unspecified occlusion or stenosis of right middle cerebral artery      2. Hemiplegia, unspecified affecting left nondominant side  3. Status post administration of tPA (rtPA) in a different facility within the last 24 hours prior to admission to current facility      4. Old myocardial infarction      5. Essential (primary) hypertension      6. Nicotine dependence, unspecified, uncomplicated      7. Hyperlipidemia, unspecified      8. Anesthesia of skin      9. Slurred speech      Aug 31, [facility] [place] [facility]      0. ACS RULE OUT      0. Chest pain, unspecified      1. Acute ischemic heart disease, unspecified      2. Allergy status to narcotic agent      2. Hyperlipidemia, unspecified      2. Other specified postprocedural states      2. 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 Other nonmedicinal substance allergy status      2. Coronary angioplasty status      2. Anemia, unspecified      Aug 19, 2019 [facility]. [place]. [place] [facility]      1. Hemiplegia, unspecified affecting left nondominant side      2. Fracture of one rib, right side, initial encounter for closed fracture      3. 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 Other chest pain      5. NIHSS score 7      6. Slurred speech      7. Facial weakness      8. Post-traumatic stress disorder, unspecified      9. Bipolar disorder, unspecified      10. Essential (primary) hypertension      Apr 28, 2019 [facility]      2. Patient's noncompliance with other medical treatment and regimen      2. Essential (primary) hypertension      4. Other stimulant abuse, in remission      5. Personal history of transient ischemic attack (TIA), and cerebral infarction without residual deficits      6. Cerebral infarction, unspecified      6. Post-traumatic stress disorder, chronic      6. Weakness      7. Malingerer [conscious simulation]      8. Old myocardial infarction      7. CVA      Apr 25, 2019 [facility]      1. Cerebral infarction, unspecified      2. Hemiplegia, unspecified affecting unspecified side      3. Chronic diastolic (congestive) heart failure      4. Hypertensive heart disease with heart failure      5. Unspecified atrial fibrillation      6. Status post administration of tPA (rtPA) in a different facility within the last 24 hours prior to admission to current facility      7. NIHSS score 10      8. Homelessness      9. Atherosclerotic heart disease of native coronary artery without angina pectoris      10. Facial weakness          Care Team  [facility] P[facility]nor Specialty Phone Fax Service Dates   Buzzy Han , MD Family Medicine [phone]  Apr 17, 2018 - Current  DORFMAN, JUSTIN , D.O. Internal Medicine   Current Nelida Meuse, [place], [facility] Counselor: Professional [phone]  Current    Eulogio Ditch Case Manager/Care Coordinator [phone]  Current    Dorann Lodge., LMFT Marriage and Family Therapist [phone] Current      Collective Portal  This patient has registered at the [facility] Emergency Department   For more information visit: [email or web address]   PLEASE NOTE:     1.   Any care recommendations and other clinical information are provided as guidelines or for historical purposes only, and providers should exercise their own clinical judgment when providing care.    2.   You may only use this information for purposes of treatment, payment or health care operations activities, and subject to the limitations of applicable Collective Policies.    3.   You should consult directly with the organization that provided a care guideline or other clinical history with any questions about additional information or accuracy or completeness of information provided.    ? 2021 [place], [employer]. - [email or web address]

## 2020-01-08 DIAGNOSIS — F449 Dissociative and conversion disorder, unspecified: Secondary | ICD-10-CM

## 2020-01-08 DIAGNOSIS — Z765 Malingerer [conscious simulation]: Secondary | ICD-10-CM

## 2020-01-08 LAB — Extra Lavender Top

## 2020-01-08 LAB — Extra Gold Top

## 2020-01-08 LAB — Extra Light Blue Top

## 2020-01-08 LAB — Extra Light Green Top

## 2020-01-08 LAB — Drugs of Abuse Scn,Ur: CANNABINOIDS: POSITIVE ng/mL — AB

## 2020-01-08 LAB — Troponin I: TROPONIN I: <0.04 ng/mL (ref <0.05)

## 2020-01-08 LAB — UA,Microscopic: WBCS HPF: 1 {cells}/[HPF] (ref 0–4)

## 2020-01-08 LAB — Urea Nitrogen: UREA NITROGEN: 12 mg/dL (ref 7–22)

## 2020-01-08 LAB — UA,Dipstick: PROTEIN: NEGATIVE (ref 5.0–8.0)

## 2020-01-08 MED ADMIN — IOHEXOL 350 MG/ML IV SOLN: 150 mL | INTRAVENOUS | Stop: 2020-01-08 | NDC 00407141493

## 2020-01-08 MED ADMIN — FENTANYL CITRATE (PF) 100 MCG/2ML IJ SOLN: 50 ug | INTRAVENOUS | Stop: 2020-01-07 | NDC 00409909422

## 2020-01-08 MED ADMIN — MORPHINE SULFATE (PF) 4 MG/ML IV SOLN: 4 mg | INTRAVENOUS | @ 03:00:00 | Stop: 2020-01-08 | NDC 00641612501

## 2020-01-08 NOTE — Consults
Patietn currently living in a hostel in the [place] area. Patient requested transportation assistance back. This Clinical research associate met with patient at bedside. Patient confirmed the address to the location. [employer] arranged to the [employer]. CSW confirmed that the patient was picked up by Benedetto Goad at discharge.

## 2020-01-08 NOTE — ED Provider Notes
[name] [name]  Emergency Department Service Report    Triage     [name] [name], a 49 y.o. male, presents with Dizziness (Pt brought in by ambulance for dizziness/fall/head injury/blurry vision. Per EMS, pt was walking in an alley, fell backwards, and hit back of head. +hematoma. Unknown LOC. +blurry vision. Hx of stroke/MI/HTN), Fall, and Head Injury    Arrived on 01/07/2020 at 4:09 PM   Arrived by Other [9]    ED Triage Vitals [01/07/20 1621]   Temp Temp Source BP Heart Rate Resp SpO2 O2 Device Pain Score Weight   37.2 ???C (98.9 ???F) Oral 112/99 76 18 94 % None (Room air) Seven --       Allergies   Allergen Reactions   ??? Ketorolac Tromethamine    ??? Tramadol    ??? Plastic Tape Rash        Initial Physician Contact       Comprehensive Exam Initiated  Contact Date: 01/07/20  Contact Time: 1611    History   23M with hx prior stroke, residual L-sided deficits, presents via EMS after becoming abruptly dizzy 1 hour prior to arrival, falling backwards, and hitting head; since then he has complained of double vision and worse weakness on his left side.  Whereas previously he complained only of impaired grip strength in the left hand, now he has trouble moving his left leg and arm.  Weakness has been constant since the incident; he estimates he did lose consciousness for about 1 minute but he remembers no  seizure activity; no tongue biting or incontinence.          Past Medical History:   Diagnosis Date   ??? CHF (congestive heart failure) (HCC/RAF)    ??? Coronary artery disease    ??? Heart attack (HCC/RAF)    ??? Hyperlipidemia    ??? Hypertension    ??? Psychiatric disorder    ??? Stroke (HCC/RAF)    ??? TIA (transient ischemic attack)         Past Surgical History:   Procedure Laterality Date   ??? BACK SURGERY     ??? CRANIOPLASTY FOR CRANIAL DEFECT     ??? KNEE JOINT MANIPULATION          Past Family History   family history is not on file.     Past Social History   he reports that he has been smoking cigarettes. He has been smoking about 0.06 packs per [name] [name]. He has never used smokeless tobacco. He reports previous drug use. Drug: Marijuana. He reports previously being sexually active. He reports that he does not drink alcohol.     Review of Systems   Constitutional: Negative for fever.   HENT: Negative for sore throat.    Eyes: Positive for visual disturbance.   Respiratory: Negative for shortness of breath.    Cardiovascular: Negative for palpitations.   Gastrointestinal: Negative for nausea.   Genitourinary: Negative for difficulty urinating.   Musculoskeletal: Positive for neck pain.   Skin: Negative for rash.   Neurological: Positive for weakness. Negative for speech difficulty.       Physical Exam   Physical Exam  Vitals signs and nursing note reviewed.   Constitutional:       General: He is not in acute distress.     Appearance: Normal appearance. He is well-developed.   HENT:      Head: Normocephalic and atraumatic.      Right Ear: External ear normal.  Left Ear: External ear normal.      Nose: Nose normal. No rhinorrhea.      Mouth/Throat:      Mouth: Mucous membranes are moist.   Eyes:      General:         Right eye: No discharge.         Left eye: No discharge.      Extraocular Movements: Extraocular movements intact.      Conjunctiva/sclera: Conjunctivae normal.      Pupils: Pupils are equal, round, and reactive to light.   Neck:      Musculoskeletal: Normal range of motion. Neck rigidity and muscular tenderness (diffuse C-spine) present.      Trachea: No tracheal deviation.   Cardiovascular:      Rate and Rhythm: Normal rate and regular rhythm.      Pulses: Normal pulses.      Heart sounds: Normal heart sounds. No murmur. No friction rub. No gallop.    Pulmonary:      Effort: Pulmonary effort is normal. No respiratory distress.      Breath sounds: Normal breath sounds. No stridor. No rhonchi.   Abdominal:      General: There is no distension.      Palpations: Abdomen is soft.      Tenderness: There is no abdominal tenderness.   Musculoskeletal: Normal range of motion.         General: No swelling, deformity or signs of injury.   Skin:     General: Skin is warm and dry.   Neurological:      General: No focal deficit present.      Mental Status: He is alert and oriented to person, place, and time.      Motor: Weakness (LUE, LLE drift) present. No abnormal muscle tone.      Coordination: Coordination normal.   Psychiatric:         Behavior: Behavior normal.           Medical Decision Making   [name] [name] is a 49 y.o. male with a history of prior stroke presenting with L-sided weakness after a syncopal episode.  Patient has been worked up extensively for syncope recently and has no evidence of disconcerting findings on EKG.  We will call code stroke and consult with neurology for apparent deficits.  Low suspicion but possible C-spine fracture.  Obtaining CTs given patient refusal of MR.        Chart Review   Previous medical records requested.  Pertinent items reviewed    ED Course      Laboratory Results     Labs Reviewed   CBC - Abnormal; Notable for the following components:       Result Value    Hemoglobin 11.3 (*)     Hematocrit 35.7 (*)     Mean Corpuscular Hemoglobin 25.1 (*)     Red Cell Distribution Width-SD 50.4 (*)     Red Cell Distribution Width-CV 17.5 (*)     Mean Platelet Volume 9.2 (*)     All other components within normal limits   ELECTROLYTE PANEL - Abnormal; Notable for the following components:    Chloride 111 (*)     Anion Gap 7 (*)     All other components within normal limits   DRUGS OF ABUSE SCN,UR - Abnormal; Notable for the following components:    Cannabinoids Positive (*)     All other components within normal limits  Narrative:     If the screen result is not consistent with the patient's medication(s), confirmation testing should be ordered for the drug(s) of interest.    Unconfirmed screening results should be used only for medical (i.e., treatment) purposes and not for non-medical purposes (e.g., employment testing, legal testing.)    Phencyclidine testing is not included in the drug screen. It can be ordered separately (test [phone]).   PROTHROMBIN TIME PANEL - Normal   UREA NITROGEN - Normal   GLUCOSE - Normal   TROPONIN I - Normal   UA,DIPSTICK - Normal   UA,MICROSCOPIC - Normal   RAINBOW DRAW TO LABORATORY    Narrative:     The following orders were created for panel order Rainbow Draw to Laboratory.  Procedure                               Abnormality         Status                     ---------                               -----------         ------                     Extra Velda Shell JWJ[[id number]]                             Final result               Extra Burna Mortimer AOZ[[id number]]                            Final result               Extra Burna Mortimer NGE[[id number]]                            Final result               Extra Lavender [id number]                               Final result               Extra Lavender [id number]                               Final result                 Please view results for these tests on the individual orders.   EXTRA LIGHT BLUE TOP   EXTRA LIGHT GREEN TOP   EXTRA LIGHT GREEN TOP   EXTRA LAVENDER TOP   EXTRA LAVENDER TOP   CREATININE,WHOLE BLOOD   URINALYSIS W/REFLEX TO CULTURE    Narrative:     The following orders were created for panel order Urinalysis w/Reflex to Culture.  Procedure                               Abnormality  Status                     ---------                               -----------         ------                     UA,Dipstick[[id number]]                  Normal              Final result               UA,Microscopic[[id number]]               Normal              Final result                 Please view results for these tests on the individual orders.   ALCOHOL,ETHYL   RAINBOW DRAW TO LABORATORY    Narrative:     The following orders were created for panel order Rainbow Draw to Laboratory.  Procedure Abnormality         Status                     ---------                               -----------         ------                     Extra Red Top (Plastic)[[id number]]                                                     Extra Light Blue [id number]                             Final result               Extra Burna Mortimer GEX[[id number]]                            Final result               Extra Gold [id number]                                   Final result                 Please view results for these tests on the individual orders.   EXTRA LIGHT BLUE TOP   EXTRA LIGHT GREEN TOP   EXTRA GOLD TOP       Imaging Results     XR chest ap portable (1 view)   Final Result by Gwyndolyn Saxon, MD (10/23 1819)   IMPRESSION:      No focal infiltrate. No radiographic evidence of clinically significant pleural effusion or pneumothorax. Normal heart size. Normal mediastinal contours. No acute osseous  abnormalities.            Signed by: Gwyndolyn Saxon   01/07/2020 6:19 PM      CT brain wo contrast   Final Result by Lance Coon., MD (10/23 1945)   IMPRESSION:      1. No evidence of acute large territory infarction, intracranial hemorrhage, mass effect, or midline shift.      2. No territorial perfusion abnormality.      3. Obscuration of the proximal left common carotid artery due to streak artifact from dense intravenous contrast. Otherwise, no evidence of hemodynamically significant stenosis or occlusion on intracranial or neck CTA.      Note: The findings were discussed over the phone with Dr. Flonnie Hailstone of the Stroke Team at 16:09 on 01/07/2020 by Dr. Lorane Gell.      Dictated by: Burt Ek   01/07/2020 5:46 PM      Signed by: Lajuana Matte   01/07/2020 7:45 PM      CT brain perfusion w contrast wo diamox   Final Result by Lance Coon., MD (10/23 1945)   IMPRESSION:      1. No evidence of acute large territory infarction, intracranial hemorrhage, mass effect, or midline shift.      2. No territorial perfusion abnormality.      3. Obscuration of the proximal left common carotid artery due to streak artifact from dense intravenous contrast. Otherwise, no evidence of hemodynamically significant stenosis or occlusion on intracranial or neck CTA.      Note: The findings were discussed over the phone with Dr. Flonnie Hailstone of the Stroke Team at 16:09 on 01/07/2020 by Dr. Lorane Gell.      Dictated by: Burt Ek   01/07/2020 5:46 PM      Signed by: Lajuana Matte   01/07/2020 7:45 PM      CT brain angiogram w contrast   Final Result by Lance Coon., MD (10/23 1945)   IMPRESSION:      1. No evidence of acute large territory infarction, intracranial hemorrhage, mass effect, or midline shift.      2. No territorial perfusion abnormality.      3. Obscuration of the proximal left common carotid artery due to streak artifact from dense intravenous contrast. Otherwise, no evidence of hemodynamically significant stenosis or occlusion on intracranial or neck CTA.      Note: The findings were discussed over the phone with Dr. Flonnie Hailstone of the Stroke Team at 16:09 on 01/07/2020 by Dr. Lorane Gell.      Dictated by: Burt Ek   01/07/2020 5:46 PM      Signed by: Lajuana Matte   01/07/2020 7:45 PM      CT neck angiogram w contrast   Final Result by Lance Coon., MD (10/23 1945)   IMPRESSION:      1. No evidence of acute large territory infarction, intracranial hemorrhage, mass effect, or midline shift.      2. No territorial perfusion abnormality.      3. Obscuration of the proximal left common carotid artery due to streak artifact from dense intravenous contrast. Otherwise, no evidence of hemodynamically significant stenosis or occlusion on intracranial or neck CTA.      Note: The findings were discussed over the phone with Dr. Flonnie Hailstone of the Stroke Team at 16:09 on 01/07/2020 by Dr. Lorane Gell.      Dictated by: Burt Ek   01/07/2020 5:46 PM      Signed by: Lajuana Matte   01/07/2020  7:45 PM      CT cervical spine wo contrast   Final Result by Lance Coon., MD (10/23 2210)   IMPRESSION:      1. No evidence of acute displaced fracture or dislocation of the cervical spine.      I, Lajuana Matte, MD, reviewed the above report personally and agree with findings.      Dictated by: Burt Ek   01/07/2020 6:26 PM      Signed by: Lajuana Matte   01/07/2020 10:10 PM          Consults       Time               Service    Progress Notes / Reassessments     ED Course as of Jan 07 2211   Sat Jan 07, 2020   1840 Workup largely negative and Neurology provisionally signing off.  Signing out patient to Dr. Godfrey Pick: patient remains in ED pending final reads of CTs including C-collar clearance; anticipate discharge.    [PT]   1857 [place] Sign Out Received:    DC pending CT reads. 41 Ed visits in last year. Today fall with head trauma, worsening left side weakness. Prelim neg. Collar off when finalized.     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  He is amenable to this plan, and was provided strict return precautions.     I discussed the above workup with the patient, answered all remaining questions, provided strict return precautions, and recommended close follow up with a PCP.  The patient is well-appearing and ambulating with a steady gait, and I believe is clinically stable for discharge.      Time               Notes      ED Procedure Notes       Any ED procedures performed are documented on separate ED procedure notes.    Clinical Impression     1. Dizziness    2. Fall, initial encounter    3. Traumatic injury of head, initial encounter        Disposition and Follow-up   Disposition:     No future appointments.    Follow up with:  Primary Care    Schedule an appointment as soon as possible for a visit       Pcp, No, MD    Go in 1 week      [facility] Emergency Department  [address]  [place] [address]  [phone]    As needed, If symptoms worsen, If you have signs/symptoms you deem to be emergent      Return precautions are specified on After Visit Summary.    New Prescriptions    No medications on file       Orders Placed This Encounter   ??? Cervical collar soft   ??? XR chest ap portable (1 view)   ??? CT brain wo contrast   ??? CT brain perfusion w contrast wo  diamox   ??? CT brain angiogram w contrast   ??? CT neck angiogram w contrast   ??? CT cervical spine wo contrast   ??? Rainbow Draw to Laboratory   ??? Extra Light Blue Top   ??? Extra Light Green Top   ??? Extra Light Green Top   ??? Extra Lavender Top   ??? Extra Lavender Top   ??? CBC without differential   ??? Prothrombin time (PT/INR)   ??? Electrolyte Panel (Na, K, Cl, CO2)   ??? Urea Nitrogen   ??? Creatinine   ??? Glucose   ??? Troponin   ??? Urinalysis w/Reflex to Culture   ??? Ethanol (Alcohol) level   ??? UA,Dipstick   ??? UA,Microscopic   ??? Rainbow Draw to Laboratory   ??? Extra Light Blue Top   ??? Extra Light Green Top   ??? Extra [employer]   ??? Cardiac monitoring   ??? MRI gurney to patient room   ??? Code stroke packet to patient room   ??? ECG, 12 lead - stroke   ??? ED 12 LEAD ECG    ??? Insert peripheral IV   ??? Urine drugs of abuse screen   ??? fentaNYL (PF) 100 mcg/2 mL inj 50 mcg   ??? fentaNYL (PF) 100 mcg/2 mL inj   ??? iohexol (Omnipaque) 350 mg/mL inj 150 mL   ??? morphine PF 4 mg/mL inj 4 mg             Resident Signature            Alen Bleacher., MD  Resident  01/07/20 1928       Glenetta Borg., MD  Resident  01/07/20 2212       Glenetta Borg., MD  Resident  01/07/20 2212      Attestation     ATTENDING NOTE    I was present with the resident during the key/critical portions of this service.  I have discussed the management with the resident, have reviewed the resident note and agree with the documented findings and plan of care.    MD Critical Care Excluding Separately Billable Procedures  Time: 18 Minutes  Time spent on: examination of patient, development of treatment plan, re-evaluations, ordering, performing treatment, interventions  Reason: High probability of multi-organ failure that required my full attention while the patient was critical.          Wadsworth Skolnick, Hortencia Pilar., MD  01/16/20 [phone]

## 2020-01-08 NOTE — Consults
Neurology Inpatient Consultation    PATIENT: [name] [name]  MRN:  [id number]  DOB:  [birth year]  DATE OF SERVICE:  01/07/2020  ATTENDING PHYSICIAN: Day, Hortencia Pilar., MD  PRIMARY CARE PHYSICIAN: Pcp, No, MD    Consult Question: stroke code for L sided weakness, blurry vision    ID/CC: [name] [name] is a 49 y.o. R handed male HTN, HLD, TIA, presented as code stroke for L sided weakness and blurry vision.     HPI:  History obtained from patient, chart review  LKWT 3:15pm 01/07/20. Patient had been feelign in his usual state of health, went to lunch, was walking in an alley when he felt dizzy, fell backwards, hit his head, and lost consciousness. When he woke up, he felt confused for a second, heard people yelling around him, and had blurry vision. Brought in by EMS and code stroke activated. NIHSS 4, CTH negative for LVO or acute infarct. Patient declined MRI because he stated the MRI headpiece reminds him of the ECT headpiece he got as a child so he can't get MRI unless he is fully asleep. Not thrombectomy candidate given no LVO. Not tPA candidate given history of head trauma and noted previous stroke in 11/2019.     Per chart review, patient has had multiple ED visits for L sided weakness, last in 11/2018 for which he received tPA. Per patient, he did not have any chest pain prior to event, just dizziness. Denied urinary/bowel incontinence. Was on aspirin at home, no blood thinners. Also takes lisinopril and lipitor. Endorsed neck pain from fall.     Review of Systems:  Denied fevers chill n/v/c/d      Past Medical History:  Past Medical History:   Diagnosis Date   ??? CHF (congestive heart failure) (HCC/RAF)    ??? Coronary artery disease    ??? Heart attack (HCC/RAF)    ??? Hyperlipidemia    ??? Hypertension    ??? Psychiatric disorder    ??? Stroke (HCC/RAF)    ??? TIA (transient ischemic attack)      Past Surgical History:  Past Surgical History:   Procedure Laterality Date   ??? BACK SURGERY     ??? CRANIOPLASTY FOR CRANIAL DEFECT     ??? KNEE JOINT MANIPULATION         Family History:   family history is not on file.     Social History:   Pt  reports that he has been smoking cigarettes. He has been smoking about 0.06 packs per day. He has never used smokeless tobacco. He reports previous drug use. Drug: Marijuana. He reports that he does not drink alcohol.     Allergies:  Allergies   Allergen Reactions   ??? Ketorolac Tromethamine    ??? Tramadol    ??? Plastic Tape Rash        Medications:   Current Post-admission Medications:  Continuous Infusions:  PRN Meds:    Prior to Admission medications    Medication Sig Start Date End Date Taking? Authorizing Provider   aspirin 81 mg chewable tablet [name] 81 mg by mouth daily.    [provider]   Atorvastatin Calcium (LIPITOR PO) Take 10 mg by mouth at bedtime .    [provider]   LISINOPRIL PO Take 20 mg by mouth daily .    [provider]   nitroglycerin 0.4 mg SL tablet Place 0.4 mg under the tongue every five (5) minutes as needed for Chest pain.  [provider]     Physical Exam  24 hour vitals:    Temp:  [37.2 ???C (98.9 ???F)] 37.2 ???C (98.9 ???F)  Heart Rate:  [76-79] 79  Resp:  [18-20] 18  BP: (111-132)/(78-99) 132/78  SpO2:  [94 %-96 %] 96 %    General: uncomfortable but in NAD, with cervical collar  HEENT: no scleral icterus  CV: RRR  Pulm: normal WOB on RA  Ext: L hand abrasion    Neuro  Mental status:  LOC: alert  Orientation: x4  Able to name pen, hand, no dysarthria    CN  II: blurry vision so VFF difficult to assess, able to see motion, no field cut  III, IV, VI: EOMI, no nystagmus  V: facial sensation decreased on L side V1 V2 V3  VII: symmetric eyebrow raise, smile  VIII: hearing intact to voice    Motor  Bulk: normal  Tone: normal    R/L  LLE 3/5 with drift  RLE 5/5  Negative hoover sign  LUE 3/5 with drift  RUE 5/5    Sensory  Decreased sensation to LT and pain on LLE/LUE, L torso, and L face    Coordination: Finger to nose intact b/l    Gait: UTA    Labs   General Labs:   CBC  Recent Labs     01/07/20  1613   WBC 6.47   HGB 11.3*   HCT 35.7*   PLT 321     Chem10  Recent Labs     01/07/20  1613   NA 139   K 3.8   CL 111*   CO2 21   BUN 12   CREAT 0.83   GLUCOSE 91     Coags  Recent Labs     01/07/20  1626   INR 1.0   PT 12.8    LFTs  No results for input(s): TOTPRO, ALBUMIN, BILITOT, BILICON, AST, ALT, GGT, ALKPHOS in the last 72 hours.    Urinalysis  Recent Labs     01/07/20  1722   SPECGRAVUR 1.029   PHUR 6.5   BILIUR Negative   KETONESUR Negative   GLUCOSEUR Negative   PROTCLUR Negative   NITRITEUR Negative   LEUKESTUR Negative   RBCSUR 1   WBCSUR 3        Imaging and Diagnostics  CTH/CTA 01/07/20  IMPRESSION:  ???  1. No evidence of acute large territory infarction, intracranial hemorrhage, mass effect, or midline shift.  ???  2. No territorial perfusion abnormality.  ???  3. Obscuration of the proximal left common carotid artery due to streak artifact from dense intravenous contrast. Otherwise, no evidence of hemodynamically significant stenosis or occlusion on intracranial or neck CTA.      ASSESSMENT:  [name] [name] is a 49 y.o. R handed male HTN, HLD, TIA, presented as code stroke for L sided weakness and blurry vision. Has had multiple ED visits with similar presentation and declining MRI. Responds to pain on both sides although less on the L. Symptoms certainly concerning for stroke however with poor effort on strength testing of LE so would consider malingering or functional component more likely than primary neurologic etiology. Has had neck pain so could consider pathology there. Has had multiple health care system visits multiple of which he left AMA. He did get tPA multiple times in the past. CTH without LVO so not thrombectomy candidate and recent stroke within 3 months so not tPA candidate.  RECOMMENDATIONS:  -Psych consult. 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 He has had 47 ED/hospital visits in 2021 alone. He also received tPA here at [facility] in 2019 then left AMA from ICU.       Patient seen with stroke fellow and discussed with attending.    Jimmy Footman, MD  Neurology Resident  Pager [phone]    The patient was discussed with the stroke team. I agree with the plan formulated as above.       Traniyah Hallett Bahr-Hosseini. M.D.   Vascular neurology

## 2020-01-08 NOTE — ED Notes
MD at bedside. Dr Day at bedside to address pt's request to [facility]

## 2020-01-08 NOTE — ED Notes
Neuro at bedside evaluating patient. Pt refusing MRI at this time.

## 2020-01-08 NOTE — ED Notes
Report received from Elaine, RN.

## 2020-01-08 NOTE — ED Notes
VSS, NAD, afebrile, A&Ox4, pt reports pain level tolerable at this time, aspen collar placed on pt and told to wear at all times until he receives a call with the final read of CT neck, denies numbness/tingling/vision changes, respirations even and unlabored, skin skins normal, dry and warm, steady gait, all belongings with pt. Pt demonstrates proper understanding of return precautions. Pt verbalizes understanding of d/c instructions and follow up with PMD. Social worker provides pt with uber ride back to his living facility.

## 2020-01-09 ENCOUNTER — Telehealth: Payer: MEDICAID

## 2020-01-09 NOTE — Telephone Encounter
On vm with contact #. Staff will recah out f/u.

## 2020-01-11 ENCOUNTER — Telehealth: Payer: Medicaid HMO

## 2020-01-11 NOTE — Telephone Encounter
Left a voicemail for scheduling a post hospital follow up with [name] [name]

## 2020-01-18 NOTE — Telephone Encounter
2nd attempt no call back from patient

## 2020-01-30 LAB — COVID-19 AND INFLUENZA CARE EVERYWHERE
COVID-19 CARE EVERYWHERE: NOT DETECTED
INFLUENZA A PCR CARE EVERYWHERE: NOT DETECTED
INFLUENZA B PCR CARE EVERYWHERE: NOT DETECTED

## 2020-01-30 LAB — CREATININE CARE EVERYWHERE
CREATININE CARE EVERYWHERE: 0.8 mg/dL (ref <=1.30)
ESTIMATED GFR CARE EVERYWHERE: 106 mL/min/BSA (ref >=60)

## 2020-01-30 LAB — APTT CARE EVERYWHERE: APTT CARE EVERYWHERE: 31 s (ref 25–37)

## 2020-01-30 LAB — INR CARE EVERYWHERE: INR CARE EVERYWHERE: 1 (ref 0.8–1.2)

## 2020-02-17 ENCOUNTER — Emergency Department
Admission: EM | Admit: 2020-02-17 | Discharge: 2020-02-17 | Disposition: A | Discharge status: Home / Self Care | Payer: MEDICAID | Attending: Emergency Medicine | Admitting: Emergency Medicine

## 2020-02-17 ENCOUNTER — Encounter (HOSPITAL_COMMUNITY): Payer: Self-pay

## 2020-02-17 ENCOUNTER — Emergency Department (HOSPITAL_BASED_OUTPATIENT_CLINIC_OR_DEPARTMENT_OTHER): Payer: MEDICAID

## 2020-02-17 DIAGNOSIS — R55 Syncope and collapse: Secondary | ICD-10-CM

## 2020-02-17 DIAGNOSIS — R42 Dizziness and giddiness: Secondary | ICD-10-CM | POA: Insufficient documentation

## 2020-02-17 DIAGNOSIS — R0602 Shortness of breath: Secondary | ICD-10-CM | POA: Insufficient documentation

## 2020-02-17 DIAGNOSIS — I252 Old myocardial infarction: Secondary | ICD-10-CM | POA: Insufficient documentation

## 2020-02-17 DIAGNOSIS — R0789 Other chest pain: Secondary | ICD-10-CM | POA: Insufficient documentation

## 2020-02-17 DIAGNOSIS — I10 Essential (primary) hypertension: Secondary | ICD-10-CM | POA: Insufficient documentation

## 2020-02-17 DIAGNOSIS — Z8249 Family history of ischemic heart disease and other diseases of the circulatory system: Secondary | ICD-10-CM | POA: Insufficient documentation

## 2020-02-17 DIAGNOSIS — Z8673 Personal history of transient ischemic attack (TIA), and cerebral infarction without residual deficits: Secondary | ICD-10-CM | POA: Insufficient documentation

## 2020-02-17 DIAGNOSIS — Z885 Allergy status to narcotic agent status: Secondary | ICD-10-CM | POA: Insufficient documentation

## 2020-02-17 DIAGNOSIS — R918 Other nonspecific abnormal finding of lung field: Secondary | ICD-10-CM

## 2020-02-17 DIAGNOSIS — Z888 Allergy status to other drugs, medicaments and biological substances status: Secondary | ICD-10-CM | POA: Insufficient documentation

## 2020-02-17 DIAGNOSIS — R079 Chest pain, unspecified: Secondary | ICD-10-CM | POA: Insufficient documentation

## 2020-02-17 DIAGNOSIS — R519 Headache, unspecified: Secondary | ICD-10-CM

## 2020-02-17 LAB — CBC WITH DIFF, BLOOD
ANC-Automated: 3.3 10*3/uL (ref 1.6–7.0)
Abs Basophils: 0 10*3/uL (ref <0.1)
Abs Eosinophils: 0.1 10*3/uL (ref 0.0–0.5)
Abs Lymphs: 1.2 10*3/uL (ref 0.8–3.1)
Abs Monos: 0.8 10*3/uL (ref 0.2–0.8)
Basophils: 0 %
Eosinophils: 2 %
Hct: 37.7 % — ABNORMAL LOW (ref 40.0–50.0)
Hgb: 12.2 gm/dL — ABNORMAL LOW (ref 13.7–17.5)
Imm Gran %: 1 % — ABNORMAL HIGH (ref <1)
Lymphocytes: 22 %
MCH: 24.9 pg — ABNORMAL LOW (ref 26.0–32.0)
MCHC: 32.4 g/dL (ref 32.0–36.0)
MCV: 77.1 um3 — ABNORMAL LOW (ref 79.0–95.0)
MPV: 8.9 fL — ABNORMAL LOW (ref 9.4–12.4)
Monocytes: 15 %
Plt Count: 349 10*3/uL (ref 140–370)
RBC: 4.89 10*6/uL (ref 4.60–6.10)
RDW: 17.4 % — ABNORMAL HIGH (ref 12.0–14.0)
Segs: 60 %
WBC: 5.4 10*3/uL (ref 4.0–10.0)

## 2020-02-17 LAB — COMPREHENSIVE METABOLIC PANEL, BLOOD
ALT (SGPT): 13 U/L (ref 0–41)
AST (SGOT): 16 U/L (ref 0–40)
Albumin: 4.4 g/dL (ref 3.5–5.2)
Alkaline Phos: 103 U/L (ref 40–129)
Anion Gap: 13 mmol/L (ref 7–15)
BUN: 11 mg/dL (ref 6–20)
Bicarbonate: 22 mmol/L (ref 22–29)
Bilirubin, Tot: 0.18 mg/dL (ref <1.2)
Calcium: 9 mg/dL (ref 8.5–10.6)
Chloride: 103 mmol/L (ref 98–107)
Creatinine: 0.85 mg/dL (ref 0.67–1.17)
GFR: >60 mL/min
Glucose: 88 mg/dL (ref 70–99)
Potassium: 4 mmol/L (ref 3.5–5.1)
Sodium: 138 mmol/L (ref 136–145)
Total Protein: 6.6 g/dL (ref 6.0–8.0)

## 2020-02-17 LAB — TROPONIN T GEN 5 W/REFLEX TO CK/CKMB
Troponin T Gen 5 w/Reflex CK/CKMB: 6 ng/L (ref <22)
Troponin T Gen 5: <6 ng/L (ref <22)

## 2020-02-17 LAB — PRO BNP, BLOOD: BNPP: 82 pg/mL (ref 0–449)

## 2020-02-17 MED ORDER — ACETAMINOPHEN 325 MG PO TABS
975.0000 mg | ORAL_TABLET | Freq: Once | ORAL | Status: AC
Start: 2020-02-17 — End: 2020-02-17
  Administered 2020-02-17 (×2): 975 mg via ORAL
  Filled 2020-02-17: qty 3

## 2020-02-17 MED ORDER — MORPHINE SULFATE 4 MG/ML IJ SOLN
4.0000 mg | Freq: Once | INTRAMUSCULAR | Status: AC
Start: 2020-02-17 — End: 2020-02-17
  Administered 2020-02-17 (×2): 4 mg via INTRAVENOUS
  Filled 2020-02-17: qty 1

## 2020-02-17 MED ORDER — IPRATROPIUM-ALBUTEROL 0.5-2.5 (3) MG/3ML IN SOLN
3.0000 mL | Freq: Once | RESPIRATORY_TRACT | Status: AC
Start: 2020-02-17 — End: 2020-02-17
  Administered 2020-02-17 (×2): 3 mL via RESPIRATORY_TRACT
  Filled 2020-02-17: qty 1

## 2020-02-17 NOTE — ED Notes (Signed)
Patient Care Handoff Report      [name] [name] is a 49 year old male.    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  Assumed care of patient at this time.     Pt has h/o   Past Medical History:   Diagnosis Date   . ACS (acute coronary syndrome) (CMS-HCC)    . Heart murmur    . Hypertension    . Myocardial infarction (CMS-HCC)      Pt presents with Chest Pain - Adult (Pt biba c/o onset 10/10 substernal chest pain radiating into left jaw and arm w/nausea while walking outside, hx MI 6 years ago, also reports near syncopal episode, lowered himself to the ground. Had syncopal episode last night, states he hit his head and friend said he was "out for 5 minutes." Denies any blood thinners other than aspirin. +nausea and some dizziness.)  .  Pt is alert and oriented x 4    Pt is on cardiac monitor: yes   If yes, cardiac rhythm: NSR     Plan: 2nd trop  Lab draw due: yes   Radiology pending: no   Fall risk: no

## 2020-02-17 NOTE — ED EKG Interpretation (Signed)
ED EKG Interpretation    EKG: Normal Sinus Rhythm with Normal Axis and no ischemic changes.

## 2020-02-17 NOTE — Discharge Instructions (Signed)
You have been evaluated in the [place] ED today with concern for headache and chest pain.  However, your examination today is not concerning for a life-threatening or emergent cause of your symptoms.  You will be discharged home at this time.  Please follow-up with your regular doctor at your earliest convenience.  Otherwise, please return to the emergency department in the event of any concern for new or severely worsening symptoms.

## 2020-02-17 NOTE — ED EKG Interpretation (Signed)
ED EKG Interpretation    EKG: NSR. Rate 75. Axis normal. No STEMI.

## 2020-02-17 NOTE — ED Notes (Signed)
02/17/2020 5:51 PM [name] [name]    An EKG was handed to Dr. Margrett Rud, copy placed in binder.

## 2020-02-17 NOTE — ED Notes (Signed)
Given food, buspass , aci to pt

## 2020-02-18 LAB — ECG 12-LEAD
ATRIAL RATE: 75 {beats}/min
ECG INTERPRETATION: NORMAL
P AXIS: 51 degrees
PR INTERVAL: 120 ms
QRS INTERVAL/DURATION: 96 ms
QT: 370 ms
QTc (Bazett): 413 ms
R AXIS: 76 degrees
T AXIS: 58 degrees
VENTRICULAR RATE: 75 {beats}/min

## 2020-02-18 NOTE — ED Provider Notes (Signed)
Emergency Department Note  [facility] electronic medical record reviewed for pertinent medical history.     Nursing Triage Note:   Chief Complaint   Patient presents with   . Chest Pain - Adult     Pt biba c/o onset 10/10 substernal chest pain radiating into left jaw and arm w/nausea while walking outside, hx MI 6 years ago, also reports near syncopal episode, lowered himself to the ground. Had syncopal episode last night, states he hit his head and friend said he was "out for 5 minutes." Denies any blood thinners other than aspirin. +nausea and some dizziness.       HPI:   49 year old male with a PMH significant for self-reported history of MI status post angioplasty, CVA with no residual deficits who presents emergency department with concern for chest pain and headache.  Patient states that symptoms began today while he was working.  Reports pressure-like left-sided chest pressure that radiates up to his left side neck.  Also endorsing some right-sided headache.  Denies any recent trauma.  Does state that the chest pain associated with shortness of breath that is worse with exertion.  No nausea vomiting.  No fever or cough.  Furthermore, patient states that he has been experiencing some lightheadedness.  States that he experienced a syncopal event last night.  Reports that he did hit his head last night in the context of his syncopal event.  No blood thinners.    HPI    Past Medical History:   Diagnosis Date   . ACS (acute coronary syndrome) (CMS-HCC)    . Heart murmur    . Hypertension    . Myocardial infarction (CMS-HCC)        Past Surgical History:   Procedure Laterality Date   . back surgery     . knee surgery     . surgery to the skull  from fracture       Family History:  For past Medical/Surgical/Social/Family History, refer to HPI, unless explicitly noted here.    Family History   Problem Relation Name Age of Onset   . Heart Attack Father         Social History [facility]):  Lives in [place]    Alcohol History  [facility]):  Occasional EtOH    Medications:   Prior to Admission Medications   Prescriptions Last Dose Informant Patient Reported? Taking?   aspirin 81 MG chewable tablet   No No   Sig: Take 1 tablet by mouth daily.   atorvastatin (LIPITOR) 20 MG tablet   No No   Sig: Take 1 tablet by mouth every evening.   lisinopril (PRINIVIL, ZESTRIL) 10 MG tablet   Yes No   Sig: Take 10 mg by mouth nightly.      Facility-Administered Medications: None       Allergies: Toradol and Tramadol    Review of Systems:   Review of Systems   Constitutional: Negative for fever.   HENT: Negative for sore throat.    Eyes: Negative for visual disturbance.   Respiratory: Positive for shortness of breath.    Cardiovascular: Positive for chest pain.   Gastrointestinal: Negative for abdominal pain, nausea and vomiting.   Genitourinary: Negative for dysuria.   Musculoskeletal: Negative for arthralgias and myalgias.   Skin: Negative for rash.   Neurological: Positive for headaches. Negative for weakness and numbness.         Physical Exam:   02/17/20  1909 02/17/20  1941 02/17/20  2042 02/17/20  2057   BP:       Pulse:  75 90 86   Resp:  13 16    Temp:       SpO2: 100% 96% 96% 96%     Nursing note and vitals reviewed.     Physical Exam  Gen: Patient is in NAD, non-toxic appearing, A/Ox4  HEENT: NC/AT, PERRL, EOMI, MMM  Neck: Supple   Cardiovascular: RRR no m/r/g  Pulmonary/Chest: CTAB, no increased WOB.  Back: No CVAT   Abdominal: Soft. NT/ND  Extr/MSK: distal pulses intact. No edema. No tenderness. FROM.   Neuro: Speech normal, MAE, ambulates steady gait  Skin: No rashes, lesions, or wounds.     Workup Review:     Results for orders placed or performed during the hospital encounter of 02/17/20   CMP   Result Value Ref Range    Glucose 88 70 - 99 mg/dL    BUN 11 6 - 20 mg/dL    Creatinine 1.82 9.93 - 1.17 mg/dL    GFR >71 mL/min    Sodium 138 136 - 145 mmol/L    Potassium 4.0 3.5 - 5.1 mmol/L    Chloride 103 98 - 107 mmol/L    Bicarbonate 22 22 - 29  mmol/L    Anion Gap 13 7 - 15 mmol/L    Calcium 9.0 8.5 - 10.6 mg/dL    Total Protein 6.6 6.0 - 8.0 g/dL    Albumin 4.4 3.5 - 5.2 g/dL    Bilirubin, Tot 6.96 <1.2 mg/dL    AST (SGOT) 16 0 - 40 U/L    ALT (SGPT) 13 0 - 41 U/L    Alkaline Phos 103 40 - 129 U/L   CBC w/ Diff Lavender   Result Value Ref Range    WBC 5.4 4.0 - 10.0 1000/mm3    RBC 4.89 4.60 - 6.10 mill/mm3    Hgb 12.2 (L) 13.7 - 17.5 gm/dL    Hct 78.9 (L) 38.1 - 50.0 %    MCV 77.1 (L) 79.0 - 95.0 um3    MCH 24.9 (L) 26.0 - 32.0 pgm    MCHC 32.4 32.0 - 36.0 g/dL    RDW 01.7 (H) 51.0 - 14.0 %    MPV 8.9 (L) 9.4 - 12.4 fL    Plt Count 349 140 - 370 1000/mm3    Segs 60 %    Imm Gran % 1 (H) <1 %    Lymphocytes 22 %    Monocytes 15 %    Eosinophils 2 %    Basophils 0 %    ANC-Automated 3.3 1.6 - 7.0 1000/mm3    Abs Lymphs 1.2 0.8 - 3.1 1000/mm3    Abs Monos 0.8 0.2 - 0.8 1000/mm3    Abs Eosinophils 0.1 0.0 - 0.5 1000/mm3    Abs Basophils 0.0 <0.1 1000/mm3    Diff Type Automated    Troponin T Gen 5 w/Reflex to CK/CKMB Green Plasma Separator Tube   Result Value Ref Range    Troponin T Gen 5 w/Reflex CK/CKMB 6 <22 ng/L   Troponin T Gen 5 w/Reflex to CK/CKMB Green Plasma Separator Tube   Result Value Ref Range    Troponin T Gen 5 <6 <22 ng/L   Pro Bnp, Blood Green Plasma Separator Tube   Result Value Ref Range    BNPP 82 0 - 449 pg/mL     CT Head W/O Contrast   Final Result   IMPRESSION:  1. No acute intracranial hemorrhage, mass effect, midline shift or extra-axial fluid collection.No acute abnormalities of the calvarium, skull base or associated soft tissues.         X-Ray Chest Single View   Final Result   IMPRESSION:   Hyperinflated but clear lungs              ED EKG Documentation:     ED EKG Note      ED EKG Interpretation by Farley Ly, MD at 02/17/2020  6:07 PM    Version 1 of 1    Author: Farley Ly, MD Service: -- Author Type: Attending   Physician    Filed: 02/17/2020  6:07 PM Date of Service: 02/17/2020  6:07 PM Status:   Signed     Editor: Farley Ly, MD (Attending Physician)       ED EKG Interpretation    EKG: Normal Sinus Rhythm with Normal Axis and no ischemic changes.         ED EKG Interpretation by Sole Lengacher, Larena Sox, MD at 02/17/2020  5:57 PM    Version 1 of 1    Author: Margrett Rud Larena Sox, MD Service: ED Medicine Author Type:   Resident    Filed: 02/17/2020  5:58 PM Date of Service: 02/17/2020  5:57 PM Status:   Cosign Needed    Editor: Lacretia Leigh, MD (Resident) Cosign Required: Yes       ED EKG Interpretation    EKG: NSR. Rate 75. Axis normal. No STEMI.           ED Orders:  Orders Placed This Encounter   Procedures   . X-Ray Chest Single View   . CT Head W/O Contrast   . CMP   . CBC w/ Diff Lavender   . Troponin T Gen 5 w/Reflex to CK/CKMB Green Plasma Separator Tube   . Troponin T Gen 5 w/Reflex to CK/CKMB Green Plasma Separator Tube   . Troponin T Gen 5 w/Reflex to CK/CKMB Green Plasma Separator Tube   . Pro Bnp, Blood Green Plasma Separator Tube   . Lab Add On Test   . ECG 12 Lead       Impression & ED Plan:  49 year old  male presents with concern for chest pain and headache.  Initial exam is unremarkable.  Initial vitals all within normal limits.  Differential concerning for ACS, arrhythmia, pneumothorax, etc..  ECG was obtained promptly and reveals normal sinus rhythm without any evidence of ischemia.  Furthermore, patient review reveals that patient has presented to numerous other hospitals recently with similar complaints.  Given the above, lower suspicion for emergent pathology underlying patient's presentation today.  Will proceed with chest x-ray and serial troponins.    Update:  CBC notable for a while microcytic anemia that is slightly improved from prior.  CMP within normal limits.  Surgical is within normal limits.  ProBNP within normal limits.  Chest x-ray negative for acute pathology.  Patient repeatedly requesting pain medications.    At this time, low concern for emergent pathology  underlying patient's condition. Plan of discharge home. Return precautions reviewed with the patient.     I have discussed my evaluation and care plan for the patient with the attending physician Dr. Beaulah Corin.       Winter Trefz, Larena Sox, MD  Resident  02/18/20 3893       Farley Ly, MD  02/19/20 2124

## 2020-03-19 LAB — HEMOGLOBIN A1C - EXTERNAL: Hemoglobin A1C: 5.6 % (ref 4.0–5.6)

## 2020-04-02 LAB — HEMOGLOBIN A1C - EXTERNAL
Estimated Average Glucose: 126 mg/dL
Hemoglobin A1c: 6 % — ABNORMAL HIGH (ref <5.7)

## 2020-04-15 LAB — CMP (EXT)
A/G Ratio (EXT): 1 (ref 0.8–2.0)
ALT/SGPT (EXT): 24 U/L (ref 9–46)
AST/SGOT (EXT): 18 U/L (ref 10–36)
Albumin (EXT): 3.2 g/dL — ABNORMAL LOW (ref 3.6–5.1)
Alkaline Phosphatase (EXT): 95 U/L (ref 40–115)
Anion Gap (EXT): 3 mmol/L — ABNORMAL LOW (ref 6–19)
BUN (EXT): 19 mg/dL (ref 6.0–24.0)
BUN/CREAT Ratio (EXT): 23.8
Bilirubin, Total (EXT): <=0.1 mg/dL — ABNORMAL LOW (ref 0.2–1.2)
CO2 (EXT): 26 mmol/L (ref 20.0–31.0)
Calcium (EXT): 8.6 mg/dL (ref 8.6–10.4)
Chloride (EXT): 112 mmol/L — ABNORMAL HIGH (ref 98–110)
Creatinine (EXT): 0.8 mg/dL (ref 0.6–1.2)
Glucose (EXT): 104 mg/dL (ref 70–140)
Potassium (EXT): 3.9 mmol/L (ref 3.5–5.3)
Protein (EXT): 6.3 g/dL — ABNORMAL LOW (ref 6.4–8.4)
Sodium (EXT): 141 mmol/L (ref 136–145)
eGFR - Creat MDRD (EXT): >60 (ref >60)

## 2020-04-17 LAB — CMP (EXT)
A/G Ratio (EXT): 1.1 (ref 0.8–2.0)
ALT/SGPT (EXT): 28 U/L (ref 9–46)
AST/SGOT (EXT): 17 U/L (ref 10–36)
Albumin (EXT): 3.7 g/dL (ref 3.6–5.1)
Alkaline Phosphatase (EXT): 96 U/L (ref 40–115)
Anion Gap (EXT): 6 mmol/L (ref 6–19)
BUN (EXT): 14 mg/dL (ref 6.0–24.0)
BUN/CREAT Ratio (EXT): 17.5
Bilirubin, Total (EXT): 0.2 mg/dL (ref 0.2–1.2)
CO2 (EXT): 23 mmol/L (ref 20.0–31.0)
Calcium (EXT): 8.9 mg/dL (ref 8.6–10.4)
Chloride (EXT): 110 mmol/L (ref 98–110)
Creatinine (EXT): 0.8 mg/dL (ref 0.6–1.2)
Glucose (EXT): 88 mg/dL (ref 70–140)
Potassium (EXT): 4.1 mmol/L (ref 3.5–5.3)
Protein (EXT): 7 g/dL (ref 6.4–8.4)
Sodium (EXT): 139 mmol/L (ref 136–145)
eGFR - Creat MDRD (EXT): >60 (ref >60)

## 2020-05-03 LAB — PROTHROMBIN TIME CARE EVERYWHERE
INR CARE EVERYWHERE: 1 (ref 0.9–1.1)
PROTHROMBIN TIME CARE EVERYWHERE: 13.5 s (ref 12.0–14.4)

## 2020-05-03 LAB — APTT CARE EVERYWHERE: APTT CARE EVERYWHERE: 30.4 s (ref 24.7–34.4)

## 2020-05-09 LAB — CREATININE CARE EVERYWHERE
CREATININE CARE EVERYWHERE: 0.79 mg/dL (ref <=1.30)
ESTIMATED GFR CARE EVERYWHERE: 106 mL/min/BSA (ref >=60)

## 2020-05-14 LAB — INR CARE EVERYWHERE: INR CARE EVERYWHERE: 1 (ref 0.8–1.2)

## 2020-05-14 LAB — CREATININE CARE EVERYWHERE
CREATININE CARE EVERYWHERE: 0.67 mg/dL (ref <=1.30)
ESTIMATED GFR CARE EVERYWHERE: 114 mL/min/BSA (ref >=60)

## 2020-05-23 LAB — COVID-19 AND INFLUENZA CARE EVERYWHERE
COVID-19 CARE EVERYWHERE: NOT DETECTED
INFLUENZA A PCR CARE EVERYWHERE: NOT DETECTED
INFLUENZA B PCR CARE EVERYWHERE: NOT DETECTED

## 2020-05-23 LAB — PROTHROMBIN TIME CARE EVERYWHERE
INR CARE EVERYWHERE: 1 (ref 0.8–1.2)
PROTHROMBIN TIME CARE EVERYWHERE: 13.3 s (ref 12.0–15.4)

## 2020-05-23 LAB — ABO/RH CARE EVERYWHERE: ABO/RH CARE EVERYWHERE: A POS

## 2020-05-23 LAB — CREATININE CARE EVERYWHERE
CREATININE CARE EVERYWHERE: 0.73 mg/dL (ref <=1.30)
ESTIMATED GFR CARE EVERYWHERE: 110 mL/min/BSA (ref >=60)

## 2020-05-23 LAB — LAB EXTERNAL RESULT UNMAPPED: ANTIBODY SCREEN CARE EVERYWHERE: NEGATIVE

## 2020-05-23 LAB — APTT CARE EVERYWHERE: APTT CARE EVERYWHERE: 30 s (ref 25–37)
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
## 2020-05-23 NOTE — ED Notes (Signed)
 Formatting of this note might be different from the original.  1139: patient to room 21 from CT s/o code stroke activation. No acute distress noted. Reports chest pain earlier (L-sided), after receiving ntg 0.4mg  SL by EMS patient became dizzy, then developed slurred speech and L sided facial droop as well as left arm and leg weakness and numbness. Reports mild CP at 3/10 with headache at 7/10. Assessment completed. Awaiting tele neurology eval.     1142: Dr Teressa Lower on tele monitor for eval    1153: back to CT for CTA brain.     1210: return to room from repeat CT. No changes.      1341: Dr Marylu Lund at bedside for re-eval and to explain results and need for transfer. Patient's slurred speech has returned with return of facial droop. Patient requests additional meds for continued headache.     1909: report given and care transferred to Dorcas Mcmurray, RN. Patient awaiting room assignment at accepting hospital    Electronically signed by Jerold Coombe (R.N.), R.N. at 05/23/2020  7:09 PM PST

## 2020-05-23 NOTE — Progress Notes (Signed)
 Formatting of this note is different from the original.  Case Management / Discharge Planning Note:  Payor:  [employer]   Issue: Stable for transfer to OSH  Dx: CVA   ?Transfer information note:   Accepting MD: Dr Darrol Angel  Accepting Facility: [facility]   Facility Address: [address], [place], [place] [address]  Room/Bed: 655  ReportMarena Chancy RN@909 [phone] Ext: [phone]  Bedside RN to:   Obtain and send copies of [id number]  and chart   ACLS transport ETA to be arranged with [facility]@[phone].   Case manager to remain available for any emerging discharge planning needs/issues   [facility] ED RN CCM   [facility] Utilization Management   Office: [phone]   Cell: [phone]     Electronically signed by Theophilus Kinds (R.N.), R.N. at 05/23/2020  8:19 PM PST

## 2020-05-23 NOTE — Progress Notes (Signed)
 Formatting of this note is different from the original.  CASE MANAGEMENT ACUTE TRANSFER NOTE     Patient stable for transfer: yes per Dr. Cyndie Chime  Diagnosis/PMH: Stroke  Reason for ED Transfer: non member  Type of bed requested: telemetry  Financial Counselor informed for payer identification: Kiron Moore.     Updated primary MD :    Please call case manager for any questions/changes in patient's status.    Case manager to remain available for any emerging needs/issues.     -------------------------------------------------------------------------------------------------------------------    Electronically signed by:  Renato Shin RN  Case Manager  05/23/2020  2:25 PM     Informed by Ferne Reus, [facility] Transfer Liaison, patient has been accepted by Dr. Darrol Angel, waiting for COVID test results, for bed assignment, patient agreed to be discharged to [facility]  05/23/2020 6:30 PM  Jose Reymundo, R.N. C.M.  Cell [phone], [phone]    Patient's COVID results negative, gave copy of results to Ron for bed assignment.  05/23/2020 7:06 PM  Jose Reymundo, R.N. C.M.  Cell [phone], [phone]      Electronically signed by Renato Shin (R.N.), R.N. at 05/23/2020  7:07 PM PST

## 2020-05-23 NOTE — Progress Notes (Signed)
 Formatting of this note is different from the original.  Tele stroke note    CT angio images reviewed no LVO some ? Stenosis vs Poor study in Rt MCA     CT perfusion -- No Defect     Given in consistant exam and No LVO Risk of IV thrombolysis outweighs benefits     Will recommend to     PLEASE USE THE FOLLOWING SUPPLEMENTARY ORDER SET FOR ADMISSION: STROKE ISCHEMIC HOSP IP SCAL    Admit the patient to the telemetry unit     Neuro checks q 2 hours.     Head Position- keep head of bed flat x 24 hours.   (consider aspiration risk for head of bed orders. If patient is aphasic, has severe facial weakness, severe dysarthria or large hemispheric stroke, keep HOB up at 30 degrees).  OK to sit up to eat if passes bedside swallow.     Antithrombotics-Given that the patient has had a TIA or a low NIHSS stroke, recommend starting clopidogrel 300 mg once and aspirin 325 mg once. Then clopidogrel 75 mg daily and aspirin 81 mg daily for 21 days. After 21 days, stop clopidogrel and continue aspirin.       Lipids-     , repeat lipid panel for AM.If it has been checked in the past month, it does not need to be repeated      Lipitor statin 40mg .   Statin therapy with intensive lipid lowering effects is recommended for patients with a stroke to be of presumed atherosclerotic origin regardless of LDL.    Blood Pressure- Allow permissive HTN up to 220/120 for the next 24 hours, if greater can treat with prn labetalol 10 mg q 5 minutes for maximum of 150 mg in 24 hours.    If greater than two doses used, please start clevidipine/nicardipine drip.   A reasonable goal is to lower blood pressure by 15% during the first 24 hours after onset of stroke if BP is > 220/120  If nicardipine/clevidipine is started, maintain SBP between 180-220 for first 24 hours and then reduce further if cleared by local neurology.     Glucose: No results found for this basename:  HGA1C  , Fingerstick q 6 hours for the first 24 hours, keep glucose < 180, use RISS  if necessary. Fingerstick beyond 24 hours at the discretion of the local managing team.  Avoid hypoglycemia. Check HgbA1C in AM. If it was checked in the past month, it does not have to be repeated.    Further diagnostic imaging to be ordered as a routine study (ie. not STAT and no telerad request needed):   Echo   Carotid duplex, depending upon carotid duplex results, further recommendations for MRA/CTA head and neck from local neurology.     MRI brain without contrast (Do not perform if patient has a contraindication to MRI)     Normal Saline at 100 ml/hour.      Tylenol prn for Temp of more than 38.0 C/100.4 F    PT/OT/ST consult for tomorrow AM if deemed necessary by the local managing team.     DVT prophylaxis: SCDs     Please call back the Telemedicine [phone]) consult pager if any sudden decline in neurological status while in ED, otherwise please consult your local neurologist for any other questions regarding the patient's ischemic stroke.     The above recommendations assume that the patient?s goals of care are consistent with full work-up and treatment,  and that there is no medical contraindication to suggested studies and treatment.       If any recommendation is not felt to be indicated at the discretion of onsite treating physicians, please document the reason.      electronically signed by:   Eber Jones M.D.  Stroke/Cerebrovascular Disease  Department of Neurology  [facility]  05/23/2020  1:49 PM      Electronically signed by Thomasenia Bottoms (M.D.), M.D. at 05/23/2020  1:49 PM PST

## 2020-06-16 ENCOUNTER — Inpatient Hospital Stay
Admit: 2020-06-16 | Discharge: 2020-06-17 | Disposition: A | Discharge status: Home / Self Care | Payer: MEDICAID | Source: Home / Self Care

## 2020-06-16 ENCOUNTER — Ambulatory Visit: Payer: MEDICAID

## 2020-06-16 DIAGNOSIS — R109 Unspecified abdominal pain: Secondary | ICD-10-CM

## 2020-06-16 DIAGNOSIS — K5900 Constipation, unspecified: Secondary | ICD-10-CM

## 2020-06-16 LAB — Extra Light Green Top

## 2020-06-16 LAB — Lipase: LIPASE: 52 U/L (ref 13–69)

## 2020-06-16 LAB — Differential Automated: BASOPHIL PERCENT, AUTO: 0.6 (ref 1.30–3.40)

## 2020-06-16 LAB — Basic Metabolic Panel
GFR ESTIMATE FOR NON-AFRICAN AMERICAN: >89 mL/min/{1.73_m2} (ref 96–106)
GLUCOSE: 105 mg/dL — ABNORMAL HIGH (ref 65–99)

## 2020-06-16 LAB — Bilirubin,Conjugated: BILIRUBIN,CONJUGATED: <0.2 mg/dL (ref <=0.3)

## 2020-06-16 LAB — Alanine Aminotransferase: ALANINE AMINOTRANSFERASE: 18 U/L (ref 8–70)

## 2020-06-16 LAB — UA,Dipstick: SPECIFIC GRAVITY: 1.014 (ref 1.005–1.030)

## 2020-06-16 LAB — Amylase: AMYLASE: 46 U/L (ref 31–124)

## 2020-06-16 LAB — Albumin: ALBUMIN: 4.4 g/dL (ref 3.9–5.0)

## 2020-06-16 LAB — Alkaline Phosphatase: ALKALINE PHOSPHATASE: 93 U/L (ref 37–113)

## 2020-06-16 LAB — UA,Microscopic: SQUAMOUS EPITHELIAL CELLS: 1 {cells}/uL (ref 0–17)

## 2020-06-16 LAB — CBC: HEMATOCRIT: 34.7 — ABNORMAL LOW (ref 38.5–52.0)

## 2020-06-16 LAB — Aspartate Aminotransferase: ASPARTATE AMINOTRANSFERASE: 16 U/L (ref 13–62)

## 2020-06-16 MED ORDER — BISACODYL 5 MG PO TBEC
5 mg | ORAL_TABLET | Freq: Two times a day (BID) | ORAL | 0 refills | 7.00 days | Status: AC
Start: 2020-06-16 — End: ?

## 2020-06-16 MED ORDER — BISACODYL 10 MG RE SUPP
10 mg | Freq: Every day | RECTAL | 0 refills | 10 days | Status: AC | PRN
Start: 2020-06-16 — End: ?

## 2020-06-16 MED ADMIN — FENTANYL CITRATE (PF) 100 MCG/2ML IJ SOLN: 50 ug | INTRAVENOUS | @ 21:00:00 | Stop: 2020-06-16 | NDC 00409909412

## 2020-06-16 MED ADMIN — IOHEXOL 350 MG/ML IV SOLN: 125 mL | INTRAVENOUS | @ 22:00:00 | Stop: 2020-06-16 | NDC 00407141476

## 2020-06-16 MED ADMIN — SODIUM CHLORIDE 0.9 % IV BOLUS: 1000 mL | INTRAVENOUS | @ 21:00:00 | Stop: 2020-06-16 | NDC 00338004904

## 2020-06-16 MED ADMIN — ONDANSETRON HCL 4 MG/2ML IJ SOLN: 4 mg | INTRAVENOUS | @ 21:00:00 | Stop: 2020-06-16 | NDC 60505613000

## 2020-06-16 NOTE — ED Notes
Patient discharged from emergency department ambulatory with stable condition. Rx on e sent and instructions on hand.   Emphasized to f/u for any concern and with PMD.   Otherwise instructed to return to the emergency department as needed for any problem or concerns.   The patient was given a copy of his discharge instructions/AVS.   No complaints made.   Patient declined last set of VS.   All belongings sent with patient.

## 2020-06-16 NOTE — ED Notes
Report given to Fatima RN

## 2020-06-16 NOTE — ED Provider Notes
[facility]  Emergency Department Service Report    [name] [name] 50 y.o. male , presents with Abdominal Pain      Triage   Arrived on 06/16/2020 at 12:01 PM   Arrived by Car [5]    ED Triage Vitals [06/16/20 1208]   Temp Temp Source BP Heart Rate Resp SpO2 O2 Device Pain Score Weight   37 ???C (98.6 ???F) Oral 99/63 88 16 95 % None (Room air) Seven 83.9 kg (185 lb)       Pre hospital care:       Allergies   Allergen Reactions   ??? Ketorolac Tromethamine    ??? Tramadol    ??? Plastic Tape Rash       History         The history is provided by the patient. No language interpreter was used.   [name] [name] is a 50 y.o. male presenting with acute abdominal pain in the RLQ radiating to the back. The pain started around 11 am this morning and is constant. He endorses nausea but denies vomiting. He denies any changes in bowel habits or urination. He denies fever or recent illness.        Past Medical History:   Diagnosis Date   ??? CHF (congestive heart failure) (HCC/RAF)    ??? Coronary artery disease    ??? Heart attack (HCC/RAF)    ??? Hyperlipidemia    ??? Hypertension    ??? Psychiatric disorder    ??? Stroke (HCC/RAF)    ??? TIA (transient ischemic attack)         Past Surgical History:   Procedure Laterality Date   ??? BACK SURGERY     ??? CRANIOPLASTY FOR CRANIAL DEFECT     ??? KNEE JOINT MANIPULATION          Past Family History   Family history reviewed by me and there is no pertinent past family history related to the patient's current case and/or care.             Past Social History   he reports that he has been smoking cigarettes. He has been smoking about 0.06 packs per day. He has never used smokeless tobacco. He reports previous drug use. Drug: Marijuana. He reports previously being sexually active. He reports that he does not drink alcohol.     Review of Systems   Constitutional: Negative for fever.   Gastrointestinal: Positive for abdominal pain and nausea. Negative for abdominal distention, constipation, diarrhea and vomiting.   Genitourinary: Negative for decreased urine volume, difficulty urinating, flank pain, hematuria and urgency.   All other systems reviewed and are negative.      Physical Exam   Physical Exam  Constitutional:       Appearance: Normal appearance.   HENT:      Head: Normocephalic and atraumatic.   Eyes:      Extraocular Movements: Extraocular movements intact.      Pupils: Pupils are equal, round, and reactive to light.   Cardiovascular:      Rate and Rhythm: Normal rate and regular rhythm.      Pulses: Normal pulses.      Heart sounds: Normal heart sounds.   Pulmonary:      Effort: Pulmonary effort is normal.      Breath sounds: Normal breath sounds.   Abdominal:      General: Abdomen is flat. Bowel sounds are normal. There is no distension.  Palpations: Abdomen is soft. There is no mass or pulsatile mass.      Tenderness: There is abdominal tenderness in the right lower quadrant. There is rebound. There is no right CVA tenderness, left CVA tenderness or guarding.      Hernia: No hernia is present.   Musculoskeletal:      Cervical back: Normal range of motion.   Skin:     General: Skin is warm and dry.   Neurological:      General: No focal deficit present.      Mental Status: He is alert.   Psychiatric:         Mood and Affect: Mood normal.         ED Course          Laboratory Results     Labs Reviewed   BASIC METABOLIC PANEL - Abnormal; Notable for the following components:       Result Value    Glucose 105 (*)     All other components within normal limits   CBC (PERFORMABLE) - Abnormal; Notable for the following components:    Red Blood Cell Count 4.32 (*)     Hemoglobin 11.3 (*)     Hematocrit 34.7 (*)     Mean Corpuscular Hemoglobin 26.2 (*)     Red Cell Distribution Width-CV 15.8 (*)     All other components within normal limits   DIFFERENTIAL, AUTOMATED (PERFORMABLE) - Abnormal; Notable for the following components:    Absolute Lymphocyte Count 0.85 (*)     All other components within normal limits   ALT (SGPT) - Normal   AST (SGOT) - Normal   BILIRUBIN,CONJ - Normal   ALKALINE PHOSPHATASE - Normal   ALBUMIN - Normal   LIPASE - Normal   AMYLASE - Normal   UA,DIPSTICK - Normal   UA,MICROSCOPIC - Normal   RAINBOW DRAW TO LABORATORY    Narrative:     The following orders were created for panel order Rainbow Draw to Laboratory Clinton Gallant).  Procedure                               Abnormality         Status                     ---------                               -----------         ------                     Extra Burna Mortimer EAV[[id number]]                            Final result                 Please view results for these tests on the individual orders.   CBC & AUTO DIFFERENTIAL    Narrative:     The following orders were created for panel order CBC & Plt & Diff.  Procedure                               Abnormality         Status                     ---------                               -----------         ------  ZOX[[id number]]                          Abnormal            Final result               Differential, Automated[[id number]]      Abnormal            Final result                 Please view results for these tests on the individual orders.   URINALYSIS W/REFLEX TO CULTURE    Narrative:     The following orders were created for panel order Urinalysis w/Reflex to Culture.  Procedure                               Abnormality         Status                     ---------                               -----------         ------                     UA,Dipstick[[id number]]                  Normal              Final result               UA,Microscopic[[id number]]               Normal              Final result                 Please view results for these tests on the individual orders.   EXTRA LIGHT GREEN TOP       Imaging Results     CT abd+pelvis w contrast   Final Result by Clemetine Marker., MD (04/02 1539)   IMPRESSION:      1.  Large rectal stool burden, mild thickening of the distal rectum/anus junction. No definite obstructing lesion.   2.  Normal appendix.      I, [name] Massed, MD, have reviewed this radiologic study personally and I am in full agreement with the findings presented in this report.      Dictated by: Lonzo Cloud   06/16/2020 3:01 PM      Signed by: [name] [name]   06/16/2020 3:39 PM          Administered Medications     Medication Administration from 06/16/2020 1201 to 06/16/2020 1936       Date/Time Order Dose Route Action Action by Comments     06/16/2020 1424 sodium chloride 0.9% IV soln bolus 1,000 mL 0 mL Intravenous Stopped Paulita Cradle, RN      06/16/2020 1331 sodium chloride 0.9% IV soln bolus 1,000 mL 1,000 mL Intravenous New Bag/ Syringe/ Cartridge Marina Goodell [place], RN      06/16/2020 1332 fentaNYL (PF) 100 mcg/2 mL inj 50 mcg 50 mcg IV Push Given Donette Larry, RN      06/16/2020 1331 ondansetron 4 mg/2 mL inj 4 mg 4  mg Intravenous Given Donette Larry, RN      06/16/2020 1446 iohexol (Omnipaque) 350 mg/mL inj 125 mL 125 mL Intravenous Given Hena, Kara 1.57ml/sec          Procedures   Procedural Sedation  Procedures    MDM     Pacen Watford is a 50 y/o M presenting with RLQ abdominal pain and nausea that started this morning. Abdominal CT w/ contrast revealed a large rectal stool burden. Appendicitis was considered and ruled out with abdominal CT. Diverticulitis and cholecystitis were also ruled out with CT. Bowel obstruction was considered but thought to be less likely given positive bowel sounds, ruled out with CT. Vascular injury ruled out with CT. Kidney stones considered given pain radiating to the back but thought unlikely given negative CVA tenderness and ruled out with CT.   - Laxative + stool softener   - Pain management       Data Reviewed/Counseling: I have reviewed the patient's vital signs, nursing notes and old medical records. I had a detailed discussion with the patient regarding the historical points, exam findings, and any diagnostic results supporting the discharge diagnosis. I also discussed lab results, radiology results and the need for outpatient follow-up.          Clinical Impression     1. Abdominal pain, acute    2. Constipation, unspecified constipation type        Prescriptions     Discharge Medication List as of 06/16/2020  4:12 PM      START taking these medications    Details   bisacodyl 10 mg suppository Place 1 suppository (10 mg total) rectally daily as needed for Constipation., Starting Sat 06/16/2020, Normal      bisacodyl 5 mg EC tablet Take 1 tablet (5 mg total) by mouth two (2) times daily., Starting Sat 06/16/2020, Until Mon 07/16/2020, Normal             Disposition and Follow-up   Disposition: Discharge [1]    No future appointments.    Follow up with:  Pcp, No, MD    Schedule an appointment as soon as possible for a visit   As needed, If symptoms worsen      Return precautions are specified on After Visit Summary.    The documentation on this chart was performed by Pete Glatter, scribed for Lelon Mast., MD    06/16/2020 12:38 PM     All scribe entries and documentation made by the scribe were entered at my direction.  I have reviewed this medical record and agree to the accuracy and completeness of the content entered by the scribe.  The documentation recorded by the scribe accurately reflects the service I personally performed and the decisions made by me.    Lelon Mast., MD 7:37 PM 06/16/2020                   Lelon Mast., MD  06/16/20 [phone]

## 2020-06-16 NOTE — ED Notes
COLLECTIVE?NOTIFICATION?06/16/2020 12:01?Jafari, Horris?MRN: [id number]    [facility] [name]'s patient encounter information:   NFA:[id number]  Account [id number]  Billing Account [id number]      Criteria Met      6 Visits in 180 Days    2 Visits in 30 Days    Security and Safety  No recent Security Events currently on file    ED Care Guidelines  There are currently no ED Care Guidelines for this patient. Please check your facility's medical records system.            E.D. Visit Count (12 mo.)  Facility Visits   [facility] 1   [facility] 1   [facility] 1   [facility] 1   [facility]   [facility] 1   [place] [facility] 1   [facility] [place] [facility] 1   [facility] 1   [facility] 1   [facility] 1   [facility] 1   [facility] 1   [facility] 2   [facility] 1   [facility] 2   [facility] 1   [facility] 1   [facility] 2   [facility] 1   [facility] 1   [facility] 1   [facility] 1   [place] [facility] 2   [facility] 1   [facility] 2   [facility] 1   [facility] 1   [facility] 1   [facility] 1   [address] - [place] [facility] [place] 2   [facility] 2   [facility] 3   [facility] 1   [facility] [place] ([facility]D [facility] 2   [place] 2   [facility] [place] 1   [place] [facility] 2   [facility] 1   [facility] 2   Total 53   Note: Visits indicate total known visits.     Recent Emergency Department Visit Summary  Showing 10 most recent visits out of 52 in the past 12 months  Date Facility [facility] Type Diagnoses or Chief Complaint   Jun 16, 2020 [facility] [place] Emergency     May 27, 2020 [place] [place] [place] Emergency  Chief Complaint: STROKE    May 23, 2020 [facility] - [facility]. [place]. [place] Emergency      Chest pain, unspecified      Weakness      Chest Pain      May 22, 2020 [facility] [place] [facility]  Chief Complaint: ABDOMINAL PAIN    May 22, 2020 [facility] [facility] [place] [place] Emergency  Chief Complaint: CHEST PAIN    May 17, 2020 [place]. [place] Emergency      Abdominal Pain      Right lower quadrant pain      Acute cystitis without hematuria      May 14, 2020 [facility] [place] [place] [facility]      0. CVA      0. HEAD PAIN      May 14, 2020 [facility] - [facility] [place]. [place]. [place] Emergency      Lower abdominal pain, unspecified      Constipation, unspecified      May 11, 2020 [facility] [place]. [place] Emergency      1. Right lower quadrant pain      2. Nicotine dependence, other tobacco product, uncomplicated      3. Essential (primary) hypertension      4. Hyperlipidemia, unspecified      5. Acute myocardial infarction, unspecified      May 10, 2020 [facility] [employer] Emergency      0. Pain, unspecified      0. Syncope and collapse      0. CHEST PAIN      1. Chest pain, unspecified      2. Long term (current) use of aspirin      2. Nicotine dependence, cigarettes, uncomplicated      2. Headache, unspecified      2. Allergy status to narcotic agent          Recent Inpatient Visit Summary  Showing 10 most recent visits out of 12 in the past 12 months  Date Facility [facility] Type Diagnoses or Chief Complaint   May 27, 2020 [place] [place] Inpatient      0. Weakness      1. Cerebral infarction, unspecified      2. Hemiplegia, unspecified affecting unspecified side      3. Other cerebral infarction      4. CONTACT WITH AND (SUSPECTED) EXPOSURE TO COVID-19      5. Hemiplegia, unspecified affecting left nondominant side      6. NIHSS score 13      7. Essential (primary) hypertension      8. Atherosclerotic heart disease of native coronary artery without angina pectoris      9. Migraine, unspecified, not intractable, without status migrainosus      May 23, 2020 [facility] [place] [facility]      0. CHEST PAIN WITH TIA STROKE      0. L SIDED WEAKNESS      1. Chest pain, unspecified      2. Allergy status to narcotic agent      2. Weakness      2. Post-traumatic stress disorder, unspecified      2. Pure hypercholesterolemia, unspecified      2. Hyperlipidemia, unspecified      2. Procedure and treatment not carried out because of patient's decision for other reasons      2. Nicotine dependence, cigarettes, uncomplicated      May 14, 2020 [facility] St[facility]      0. CVA      0. CVA SP TPA      1. Cerebral infarction, unspecified      2. 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 Slurred speech      2. Pure hypercholesterolemia, unspecified      2. Long term (current) use of aspirin      2. Contact with and (suspected) exposure to COVID-19      2. Atherosclerotic heart disease of native coronary artery without angina pectoris      2. Nicotine  dependence, cigarettes, uncomplicated      Mar 14, 2020 [facility] [place]     Jan 30, 2020 [facility] [place] [place] [facility]      0. Syncope and collapse      2. Chest pain, unspecified      3. Schizophrenia, unspecified      4. Post-traumatic stress disorder, unspecified      5. Personal history of transient ischemic attack (TIA), and cerebral infarction without residual deficits      6. Personal history of traumatic brain injury      7. 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 [place] In[facility]     Dec 09, [facility] [place] [place] Inpatient      1. Syncope and collapse      2. Essential (primary) hypertension      2. Personal history of transient ischemic attack (TIA), and cerebral infarction without residual deficits      2. Allergy status to narcotic agent      2. Atherosclerotic heart disease of native coronary artery without angina pectoris      2. Nicotine dependence, cigarettes, uncomplicated      2. Other long term (current) drug therapy      2. 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 Long term (current) use of aspirin      2. CONTACT WITH AND (SUSPECTED) EXPOSURE TO COVID-19      Nov 16, 2019 [facility] [place] [facility]      0. Headache, unspecified      0. STROKE CHEST PAIN      1. Cerebral infarction, unspecified      2. 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 Other long term (current) drug therapy      2. Hemiplegia and hemiparesis following cerebral infarction affecting left non-dominant side      2. Contact with and (suspected) exposure to      2. Hyperlipidemia, unspecified      2. NIHSS score 8      2. Long term (current) use of aspirin      Oct 30, 2019 [facility] - [facility] Peds ED [place]. [place] Neurology      CVA      1. Cerebral infarction due to unspecified occlusion or stenosis of right middle cerebral artery      2. Hemiplegia, unspecified affecting left nondominant side      3. Status post administration of tPA (rtPA) in a different facility within the last 24 hours prior to admission to current facility      4. Old myocardial infarction      5. Essential (primary) hypertension      6. Nicotine dependence, unspecified, uncomplicated      7. Hyperlipidemia, unspecified      8. Anesthesia of skin      9. Slurred speech      Oct 28, 2019 [facility] [place] In[facility]         Care Team  [facility] Special[facility] Phone Fax Service Dates   Buzzy Han , MD Family Medicine [phone]  Apr 17, 2018 - Current [facility], [place] , D.O. Internal Medicine   Current    Nelida Meuse, [place], [facility], [facility] Counselor: Professional [phone]  Current    Dorann Lodge., LMFT Marriage and Family Therapist [phone]  [phone] Current      Collective Portal  This patient has registered at the [facility] Emergency Department   For more information visit: [email or web address]   PLEASE NOTE:     1.   Any care recommendations and other clinical information are provided as guidelines or for historical purposes only, and providers should exercise their own clinical judgment when providing care.    2.   You may only use this information for purposes of treatment, payment or health care operations activities, and subject to the limitations of applicable Collective Policies.    3.   You should consult directly with the organization that provided a care guideline or other clinical history with any questions about additional information or accuracy or completeness of information provided.    ? 2022 [place], [employer] - [email or web address]

## 2020-06-24 LAB — BMP (EXT)
Anion Gap (EXT): 7 mmol/L (ref 6–14)
BUN (EXT): 16 mg/dL (ref 9–20)
CO2 (EXT): 23 mmol/L (ref 22–30)
CalciumCalcium (EXT): 8.2 mg/dL — ABNORMAL LOW (ref 8.4–10.2)
Chloride (EXT): 110 mmol/L — ABNORMAL HIGH (ref 98–107)
Creatinine (EXT): 0.6 mg/dL — ABNORMAL LOW (ref 0.70–1.30)
Glucose (EXT): 112 mg/dL (ref 70–125)
Osmolality (EXT): 292 mosm/kg (ref 280–305)
Potassium (EXT): 3.9 mmol/L (ref 3.5–5.1)
Sodium (EXT): 140 mmol/L (ref 137–145)
eGFR - Creat CKD-EPI (EXT): 119 mL/min/{1.73_m2} (ref >60)

## 2020-06-24 LAB — APTT CARE EVERYWHERE: APTT CARE EVERYWHERE: 28 s (ref 23.4–29.9)

## 2020-06-24 LAB — PROTHROMBIN TIME CARE EVERYWHERE
INR CARE EVERYWHERE: 1.01 (ref 0.9–1.1)
PROTHROMBIN TIME CARE EVERYWHERE: 10.9 s (ref 9.5–11.0)

## 2020-06-26 MED ORDER — MORPHINE SULFATE 4 MG/ML IJ SOLN
4.00 mg | INTRAMUSCULAR | Status: DC
Start: ? — End: 2020-06-26

## 2020-07-14 ENCOUNTER — Telehealth (EMERGENCY_DEPARTMENT_HOSPITAL): Payer: MEDICAID | Admitting: Neurology

## 2020-07-14 DIAGNOSIS — R4781 Slurred speech: Secondary | ICD-10-CM

## 2020-07-14 DIAGNOSIS — R531 Weakness: Secondary | ICD-10-CM

## 2020-07-14 DIAGNOSIS — R519 Headache, unspecified: Secondary | ICD-10-CM

## 2020-07-14 DIAGNOSIS — I639 Cerebral infarction, unspecified: Secondary | ICD-10-CM

## 2020-07-14 NOTE — Progress Notes (Signed)
Video Visit Note     I performed this clinical encounter by utilizing a real time telehealth video connection between my location and the patient's location. The patient's location was confirmed during this visit. I obtained verbal consent from the patient to perform this clinical encounter utilizing video and prepared the patient by answering any questions they had about the telehealth interaction.    I spent a total of 35 minutes today on this patient's visit.    Teleneurology Note - Stroke    Requesting Facility -  [facility]  Requesting Physician - Dr. Estevan Ryder  Reason Consultation - stroke alert - L-sided weakness    HPI:   Nicholas Krueger is a 89yr male h/o HLD, HTN, reports prior stroke 2019 (L-sided weakness with minimal residual deficits), chronic pain with multiple ED visits who presented to OSH with new L-sided weakness. LSN 4/30 10am. He was getting [place] car on the highway when he developed new L hand numbness/weakness that progressed to L arm/leg weakness/numbness and slurred spech. He also has severe R-sided headache. Symptoms similar to 2019 when he states he was diagnosed with stroke for L-sided weaknes and received tPA with minimal residual deficits. No prior history of similar headaches or migraine features. Denies h/o seizures. No recent strokes (last 2019), no bleeding history/complications, no anticoagulation, no recent surgeries/procedures.    On ED arrival, BP 124/76, HR 104, 95% RA, FSG 130    Exam:   Alert and interactive, oriented to age but not month ("May"), speech is slowed/effortful with sttuttering and mild/moderate slurred speech, but he is conversant. Some difficulty naming items on stroke card, but can follow simple commands briskly and convey history. No gaze preference, EOMI, L facial droop, mild/moderate dysarthria. RUE/RLE sustained AG without drift. No spontaneous movement in LUE/LLE. Reports absent sensation in L face/arm/leg (no  response to nox stim).     Outside hospital NIHSS: 14    Outside brain imaging results:   HCT: no acute hemorrhage, ?possible hypodensity in R frontal gyrus (only noted on image #19/33, series 2), also with possible hyperdense R MCA (series 2 #13/33) - but per rads this is stable compared to prior HCT.     Outside lab results: N/A    Social History: Relevant history reviewed    Relevant Meds: aspirin, lipitor, lisinopril. No anticoagulation    Assessment/Plan:   Nicholas Krueger is a 89yr male h/o HLD, HTN, reports prior stroke 2019 (L-sided weakness with minimal residual deficits), chronic pain with multiple ED visits who presented to OSH with new L-sided weakness. NIHSS 14, exam notable for effortful/stuttering speech with mild/moderate dysarthria, L facial droop, dense L hemiplegia and hemisensory loss. Clinical presentation concerning for acute ischemic stroke. He is within the window for tPA (LSN 10am) with no contraindications identified. Reviewed risk/benefits of tPA with patient who consented to tPA. tPA recommended at 11:45am. He still needs stat CTA head/neck to evaluate for LVO.     Patient was  informed of risks including there is about approximately 6% chance of having ICH or bleeding in other parts of the body which could worsen the symptoms or be deadly. The benefits of receiving tPA include improvement of stroke symptoms and potentially decreased disability up to 2-3 months post stroke. Alternatives to receiving tPA includes less chance of improving stroke symptoms.     -- Recommended tPA administration at 11:45am  -- STAT CTA head/neck to evaluate for LVO; please notify neurology if LVO identified  --  Post-tPA neuromonitoring per protocol (neuro checks, hold ASA/DVT ppx x24 hours until repeat neuroimaging, BP < 180/105 x24 hours post tPA)  -- Repeat HCT in 24 hours (ideally would obtain MRI if pt tolerates to evaluate for stroke vs mimic given prior history of similar L-sided symptoms)  --  Toxic/metabolic/infectious work-up per ED    Outside hospital administration of IV tPA Yes  Did you authorize tPA administration? Yes  Transfer for higher level of care No.      Thank you for the Teleconsult.      Electronically signed by:   Nicholas Leiter, MD  Attending  Dept of Neurology   [place], [place] [facility] tel: [phone]   Televideo - Yes/No: Yes  Telephone - Yes/No: Yes      Date of Consultation: 07/14/2020   Time of Consultation: 11:27am   Start time for Televideo:11:36am  Time of Teleneurology Consult:  35 minutes

## 2020-08-15 ENCOUNTER — Inpatient Hospital Stay
Admit: 2020-08-15 | Discharge: 2020-08-16 | Disposition: A | Discharge status: Other Acute Inpatient Hospital | Payer: Medicaid HMO | Source: Home / Self Care

## 2020-08-15 DIAGNOSIS — R45851 Suicidal ideations: Secondary | ICD-10-CM

## 2020-08-15 LAB — CREATININE

## 2020-08-15 LAB — Glucose, Whole Blood: GLUCOSE, WHOLE BLOOD: 113 mg/dL — ABNORMAL HIGH (ref 65–99)

## 2020-08-15 LAB — Electrolyte Panel: ANION GAP: 9 mmol/L (ref 8–19)

## 2020-08-15 LAB — CBC: RED BLOOD CELL COUNT: 4.46 x10E6/uL (ref 4.41–5.95)

## 2020-08-15 NOTE — ED Notes
COLLECTIVE?NOTIFICATION?08/15/2020 13:57?[name], [name]?MRN: [id number]    Criteria Met      2 Visits in 30 Days    6 Visits in 180 Days    Security and Safety  No Security Events were found.  ED Care Guidelines  There are currently no ED Care Guidelines for this patient. Please check your facility's medical records system.  Care History  No Care Histories were found.  Flags  No Flags were found.    History of Housing Insecurity  Date Identified Identifying Facility Grade   07/30/20 12:00 PM [facility] In[facility]     Notable Tags (groups)  No Notable Tags (Groups) were found.    Prescription Drug Report (12 Mo.)  PDMP query found no report.  Anticoagulant Claims   No Anticoagulant Claims were found.   E.D. Visit Count (12 mo.)  Facility Visits   [facility] 1   [facility] 1   [facility] 1   [facility] 1   [facility] 1   [facility] 1   [place]) 1   [facility] 1   [facility] 1   [facility] 1   [facility] 1   [facility] 1   [facility] 1   [facility] 1   [facility] 2   [facility] 3   [facility] 2   [facility] 1   [facility] 1   [facility] 2   [facility] 2   [facility] 1   [facility] 1   [facility] 1   [facility] 1   [facility] 1   [facility] 1   [facility] 1   [facility] ([facility] 2   [facility] 1   [facility] 1   [facility] 1   [facility] 1   [address] [facility] [place] [facility] [place] 2   [facility] 2   [facility] 1   [facility] 2   [facility] 3   [facility] 1   [place] [facility] [place] (CCD Exch.) 1   [facility] 2   [facility] [place] 1   [facility] 1   [facility] 1   [facility] 2   Total 59   Note: Visits indicate total known visits.     Recent Emergency Department Visit Summary  Showing 10 most recent visits out of 58 in the past 12 months   Date Facility [facility] Type Diagnoses or Chief Complaint    Aug 15, 2020  [name].  [place]  Emergency      1. Psychiatric Evaluation      Aug 14, 2020  [facility]  [place]  Emergency  Chief Complaint: Suicidal    Aug 13, 2020  [facility] [place] [facility]  [place]  Emergency      0. CBA      Aug 11, 2020  [facility]  [place]  Emergency      0. [id number]      Aug 09, 2020  [facility]  [place]  [place]  Emergency      0. Suicidal ideations      2. Essential (primary) hypertension      3. Bipolar disorder, unspecified      Jul 30, 2020  [facility]  [place]  [place]  Emergency  Chief Complaint: HA/DIZZY    Jul 20, 2020  [facility]  [place]  Emergency  Chief Complaint: POSS STROKE ALERT    Jul 17, 2020  [facility]  [place]  Emergency      0. CP HA      1. Encounter for other administrative examinations      2. Unvaccinated for COVID-19      2. Procedure and treatment not carried out due to patient leaving prior to being seen by health care Dang Mathison      2. Immunization not carried out for unspecified reason      2. Homelessness unspecified      Jul 14, 2020  [facility].  [place]  Emergency      0. STROKE      Jul 14, 2020  [facility]  [place]  Emergency      -1. Weakness      1. Other cerebral infarction      3. Hemiplegia and hemiparesis following cerebral infarction affecting left non-dominant side      4. Contact with and (suspected) exposure to COVID-19      5. Personal history of transient ischemic attack (TIA), and cerebral infarction without residual deficits      6. Personal history of nicotine dependence        Recent Inpatient Visit Summary  Showing 10 most recent visits out of 15 in the past 12 months   Date Facility [facility] Type Diagnoses or Chief Complaint    Aug 13, 2020  [facility]  [place]  Medical Surgical      0. CVA TPA      1. Cerebral infarction, unspecified      2. Hemiplegia, unspecified affecting left nondominant side      2. Hyperlipidemia, unspecified      2. NIHSS score 12      2. Pure hypercholesterolemia, unspecified      2. Weakness      2. Essential (primary) hypertension      2. Allergy status to narcotic agent      2. Facial weakness      Jul 20, 2020  [facility]  [place]  Medical Surgical      0. Cerebral infarction, unspecified      0. Muscle weakness (generalized)      0. Bell's palsy      1. 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 Slurred speech      2. Aphasia      3. Atherosclerotic heart disease of native coronary artery without angina pectoris      4. Essential (primary) hypertension      5. Hyperlipidemia, unspecified      6. Mental  disorder, not otherwise specified      Jul 14, 2020  [facility]  [place]  Medical Surgical      0. STROKE S/P TPA      1. Cerebral infarction, unspecified      2. Long term (current) use of aspirin      2. Status post administration of tPA (rtPA) in a different facility within the last 24 hours prior to admission to current facility      2. Hemiplegia and hemiparesis following cerebral infarction affecting left non-dominant side      2. Atherosclerotic heart disease of native coronary artery without angina pectoris 2. Nicotine dependence, cigarettes, uncomplicated      2. Essential (primary) hypertension      2. Unvaccinated for COVID-19      2. Immunization not carried out for unspecified reason      May 27, 2020  [facility] [place].  [place]  Inpatient      0. Weakness      1. Cerebral infarction, unspecified      2. Hemiplegia, unspecified affecting unspecified side      3. Other cerebral infarction      4. CONTACT WITH AND (SUSPECTED) EXPOSURE TO COVID-19      5. Hemiplegia, unspecified affecting left nondominant side      6. NIHSS score 13      7. Essential (primary) hypertension      8. Atherosclerotic heart disease of native coronary artery without angina pectoris      9. Migraine, unspecified, not intractable, without status migrainosus      May 23, 2020  [facility]  [place]  Medical Surgical      0. CHEST PAIN WITH TIA STROKE      0. L SIDED WEAKNESS      1. Chest pain, unspecified      2. Allergy status to narcotic agent      2. Weakness      2. Post-traumatic stress disorder, unspecified      2. Pure hypercholesterolemia, unspecified      2. Hyperlipidemia, unspecified      2. Procedure and treatment not carried out because of patient's decision for other reasons      2. Nicotine dependence, cigarettes, uncomplicated      May 14, 2020  [facility]  [place]  Stroke      0. CVA      0. CVA SP TPA      1. Cerebral infarction, unspecified      2. 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 Slurred speech      2. Pure hypercholesterolemia, unspecified      2. Long term (current) use of aspirin      2. Contact with and (suspected) exposure to COVID-19      2. Atherosclerotic heart disease of native coronary artery without angina pectoris      2. Nicotine dependence, cigarettes, uncomplicated      Mar 14, 2020  [facility]  [place]  Inpatient     Jan 30, 2020  [facility]  [place]  [place]  Observation      0. Syncope and collapse 2. Chest pain, unspecified      3. Schizophrenia, unspecified      4. Post-traumatic stress disorder, unspecified  5. Personal history of transient ischemic attack (TIA), and cerebral infarction without residual deficits      6. Personal history of traumatic brain injury      7. 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  [place]  Inpatient     Dec 09, 2019  [facility]  [place]  [place]  Inpatient      1. Syncope and collapse      2. Essential (primary) hypertension      2. Personal history of transient ischemic attack (TIA), and cerebral infarction without residual deficits      2. Allergy status to narcotic agent      2. Atherosclerotic heart disease of native coronary artery without angina pectoris      2. Nicotine dependence, cigarettes, uncomplicated      2. Other long term (current) drug therapy      2. 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 Long term (current) use of aspirin      2. CONTACT WITH AND (SUSPECTED) EXPOSURE TO COVID-19        Care Team  [facility] [place] [facility] Phone Fax Service Dates   Buzzy Han , MD Family Medicine [phone]  Apr 17, 2018 - Current    [facility] [place] , D.O. Internal Medicine   Current    Nelida Meuse, [place], [facility] [place], [facility] Counselor: Professional [phone]  Current    Dorann Lodge., LMFT Marriage and Family Therapist [phone] Current      This patient has registered at the [facility] Emergency Department   For more information visit: [email or web address]   PLEASE NOTE:     1.   Any care recommendations and other clinical information are provided as guidelines or for historical purposes only, and providers should exercise their own clinical judgment when providing care.    2.   You may only use this information for purposes of treatment, payment or health care operations activities, and subject to the limitations of applicable Collective Policies.    3.   You should consult directly with the organization that provided a care guideline or other clinical history with any questions about additional information or accuracy or completeness of information provided.    ? 2022 [place], [employer] - [email or web address]

## 2020-08-15 NOTE — ED Provider Notes
[facility]  Emergency Department Service Report    Triage     [name] [name], a 50 y.o. male, presents with Psychiatric Evaluation (SI with plan to cut wrist or OD; HI )    Arrived on 08/15/2020 at 1:57 PM   Arrived by Walk-in [14]    ED Triage Vitals   Temp Temp Source BP Heart Rate Resp SpO2 O2 Device Pain Score Weight   08/15/20 1406 08/15/20 1406 08/15/20 1406 08/15/20 1406 08/15/20 1406 08/15/20 1406 08/15/20 1406 08/15/20 1406 08/15/20 1359   36.9 ?C (98.4 ?F) Temporal 112/69 95 18 100 % None (Room air) Zero 79.4 kg (175 lb)       Allergies   Allergen Reactions   ? Ketorolac Tromethamine    ? Tramadol    ? Plastic Tape Rash        Initial Physician Contact       Comprehensive Exam Initiated  Contact Date: 08/15/20  Contact Time: 1610    History   HPI   [name] [name] is a 50 y.o. male who is unhoused with a past medical history of stroke and heart attack, antisocial disorder s/p SA attempt last night by cutting himself presenting with persistent SI with plan. He walked into [facility] today for worsening auditory hallucinations characterized by suicidal ideations telling him to join his mother who is deceased and homicidal ideations telling him to kill his father who is currently incarcerated in [facility]. He endorses previous SA attempts via OD and hanging himself and leading to previous psychiatric hospitalizations. Denies chest pain, abdominal pain or fevers.  Denies recent suicide attempt.  States he did cut himself multiple times superficially with a razor earlier today, to his left wrist.    Past Medical History:   Diagnosis Date   ? CHF (congestive heart failure) (HCC/RAF)    ? Coronary artery disease    ? Heart attack (HCC/RAF)    ? Hyperlipidemia    ? Hypertension    ? Psychiatric disorder    ? Stroke (HCC/RAF)    ? TIA (transient ischemic attack)         Past Surgical History:   Procedure Laterality Date   ? BACK SURGERY     ? CRANIOPLASTY FOR CRANIAL DEFECT ? KNEE JOINT MANIPULATION          Past Family History   family history is not on file.     Past Social History   he reports that he has been smoking cigarettes. He has been smoking about 0.06 packs per day. He has never used smokeless tobacco. He reports previous drug use. Drug: Marijuana. He reports previously being sexually active. He reports that he does not drink alcohol.     Review of Systems   Constitutional: Negative for fever.   Cardiovascular: Negative for chest pain.   Gastrointestinal: Negative for abdominal pain.   Psychiatric/Behavioral: Positive for suicidal ideas.   All other systems reviewed and are negative.      Physical Exam   Physical Exam  Constitutional:       General: He is not in acute distress.     Appearance: Normal appearance. He is not ill-appearing, toxic-appearing or diaphoretic.      Comments: Disheveled but otherwise well-appearing patient no apparent distress.   HENT:      Head: Normocephalic and atraumatic.      Right Ear: External ear normal.      Left Ear: External ear normal.  Eyes:      General: No scleral icterus.        Right eye: No discharge.         Left eye: No discharge.   Cardiovascular:      Rate and Rhythm: Normal rate.      Pulses: Normal pulses.   Pulmonary:      Effort: Pulmonary effort is normal. No respiratory distress.      Comments: No respiratory distress, normal WOB   Abdominal:      General: Abdomen is flat.      Palpations: Abdomen is soft.      Tenderness: There is no abdominal tenderness. There is no guarding or rebound.      Comments: Soft, NT/ND, no R/G    Musculoskeletal:         General: No deformity or signs of injury.      Comments: No gross injury or deformity noted   Skin:     General: Skin is warm and dry.      Comments: Multiple superficial linear scratches to volar surface of left wrist, already scabbed over, no bleeding, no erythema, no pain.   Neurological:      General: No focal deficit present.      Mental Status: He is alert. Comments: No gross focal neurologic deficit evident            Medical Decision Making   [name] [name] is a 50 y.o. male who is unhoused with a past medical history of stroke and heart attack, antisocial disorder s/p SA attempt last night by cutting himself presenting with persistent SI with plan.  This patient presents with symptoms consistent with an underlying psychiatric disorder, most likely worsening command auditory hallucinations given underlying history of schizophrenia/schizoaffective disorder. Current presentation is not consistent with acute organic causes, including delirium, dementia or drug induced disorders (acute ingestions or withdrawal; no evidence of toxidrome). Risk factors include lethal plan or lethal attempt, expressed hopelessness and poor psychosocial support. Given the history and exam, I suspect this patient is suicidal and will require psychiatric care. Will consult psychiatry to evaluate the patient for potential hold for DTS. Will obtain additional workup below for medical clearance.     Plan: psych consult, medical detainment, basic labs, EKG, reassessment           Chart Review   Previous medical records requested.  Pertinent items reviewed:     ED Course      Laboratory Results     Labs Reviewed   CBC - Abnormal; Notable for the following components:       Result Value    Hemoglobin 11.2 (*)     Hematocrit 35.1 (*)     Mean Corpuscular Volume 78.7 (*)     Mean Corpuscular Hemoglobin 25.1 (*)     Red Cell Distribution Width-CV 16.6 (*)     All other components within normal limits   ELECTROLYTE PANEL - Abnormal; Notable for the following components:    Chloride 109 (*)     All other components within normal limits   GLUCOSE - Abnormal; Notable for the following components:    Glucose 113 (*)     All other components within normal limits   EXPEDITED COVID-19 AND INFLUENZA A B PCR, RESPIRATORY UPPER    Narrative:     This test is intended for in vitro diagnostic use under FDA Emergency Use Authorization only. Results are for the presumptive identification of COVID-19, Influenza A, and Influenza  B RNA. The [facility] is certified under the Clinical Laboratory Improvement Amendments of 1988 (CLIA-88) as qualified to perform high complexity clinical laboratory testing.   CREATININE,WHOLE BLOOD       Imaging Results     No orders to display       Consults       Time               Service      Progress Notes / Reassessments     ED Course as of 08/16/20 2148   Wed Aug 15, 2020   1618 Psychiatry has been paged. [DY]   1628 Psychiatry will come evaluate this patient. [DY]   2026 EKG without specific ST changes, conduction abnormalities, or repolarization abnormalities by my interpretation.     [DY]   2026 Patient is medically cleared for further workup by Psychiatry. [DY]      ED Course User Index  [DY] Micael Hampshire., MD       Time               Notes    Clinical Impression     1. Suicidal ideation        Disposition and Follow-up   Disposition: Transfer to Another Facility [2]       No future appointments.    Follow up with:  No follow-up provider specified.    Return precautions are specified on After Visit Summary.    Discharge Medication List as of 08/15/2020 10:27 PM          Orders Placed This Encounter   ? Expedited COVID-19 and Influenza A B PCR, Respiratory Upper   ? CBC without differential   ? Electrolyte Panel   ? Creatinine,whole blood   ? Glucose   ? Consult/Autopage to Psych/Behavioral Health (Adult & Ped)   ? Consult to Social Work - Personal assistant to Psychiatric Facility   ? ECG, 12 lead   ? Bed Request- Behavioral Health   ? DISCONTD: Tdap vaccine (Boostrix) inj 0.5 mL   ? DISCONTD: OLANZapine tab disolv 5 mg   ? DISCONTD: OLANZapine inj 5 mg   ? DISCONTD: OLANZapine tab disolv 5 mg   ? DISCONTD: hydrOXYzine tab 50 mg   ? DISCONTD: traZODone tab 50 mg   ? DISCONTD: atorvastatin tab 20 mg     Scribe Signature   I, Delice Lesch, have acted as a Stage manager for [employer] on behalf of Dr. Silverio Lay on 08/15/2020 at 4:09 PM.    Physician Signature(s)   I have reviewed this note as recorded by CC, who acted as medical scribe, and I attest that it is an accurate representation of my H&P and other events of the ED visit except as otherwise noted.       Micael Hampshire., MD  Resident  08/15/20 1754       Micael Hampshire., MD  Resident  08/15/20 2130    ATTENDING NOTE    I was present with the resident during the key/critical portions of this service.  I have discussed the management with the resident, have reviewed the resident note and agree with the documented findings and plan of care.  Bartholome Bill. Chelsea Aus MD, MPH               Joyce Gross., MD  08/16/20 [phone]

## 2020-08-15 NOTE — ED Notes
Pending bed availability; PD with pt out in waiting room at this time.  Pt calm and coopeartive

## 2020-08-16 LAB — Expedited COVID-19 and Influenza A B PCR: COVID-19 PCR/TMA: NOT DETECTED

## 2020-08-16 MED ADMIN — TETANUS-DIPHTH-ACELL PERTUSSIS 5-2.5-18.5 LF-MCG/0.5 IM SUSY: .5 mL | INTRAMUSCULAR | @ 02:00:00 | Stop: 2021-08-16

## 2020-08-16 MED ADMIN — OLANZAPINE 5 MG PO TBDP: 5 mg | ORAL | @ 02:00:00 | Stop: 2020-08-16 | NDC 49884032052

## 2020-08-16 NOTE — Progress Notes
ED Social Work Psychiatric Transfer Note    The pt has been accepted for transfer to [facility], Dr. Sherrie George, Building I, Report: [phone].  SW has updated the RD Consulting civil engineer and Psychiatry.  SW has initiated the transfer packet.  Th pt declines wanting anyone called on is behalf.    Janalyn Rouse, LCSW

## 2020-08-16 NOTE — Other
[place] - Health and Investment banker, corporate of Health Care Services   INVOLUNTARY PATIENT ADVISEMENT  (TO BE READ AND GIVEN TO THE  PATIENT AT TIME OF ADMISSION) Confidential Patient Information  See W&I Code Section 5328 and   HIPAA Priacy Rule 45 C.F.R. Section 164.508   Name of Facility     [place] [facility]   Patient?s Name     [name] [name] Admission Date     08/15/2020   Section 5150(h) of the Welfare and Institutions Code requires that each person admitted to a facility designated by the county for evaluation and treatment be given specific information orally and in writing, and in a language or modality accessible to the person and a record of the advisement be kept in the person?s medical record.   My name is Merrie Roof My position here is Resident physician   You are being placed in this psychiatric facility because it is our professional opinion, that as a result of a mental health disorder, you are likely to: (check applicable)   [X]  Harm yourself [  ] Harm someone else [  ] Be unable to be take care of your own food clothing or shelter   (List specific facts upon which the allegation of dangerous or gravely disabled due to mental health disorder is based, including pertinent facts arising from the admission interview):   We believe this is true because  You are endorsing suicidal ideation with plan to cut your wrists or overdose on sleeping pills.    You will be held for a period of up to 72 hours. This ( []  does not ) ( [x]  does) include weekends or holidays.   Your 72-hour period begins: 08/15/2020, 1405    (Time and Date)   Your 72-hour evaluation and treatment period will end at: 08/18/2020, 1405    (Time and Date)   You will be held for a period up to 72 hours. During the 72 hours you may also be transferred to another facility. You may request to be evaluated or treated at a facility of your choice. You may request to be evaluated or treated by a mental health professional of your choice. We cannot guarantee the facility or mental health professional you choose will be available, but we will honor your choice if we can.     During these 72 hours you will be evaluated by the facility staff, and you may be given treatment, including medications. It is possible for you to be released before the end of the 72 hours. But if the staff decides that you need continued treatment you can be held for a longer period of time. If you are held longer than 72 hours, you have the right to a lawyer and a qualified interpreter and a hearing before a judge. If you are unable to pay for the lawyer, then one will be provided to you free of charge.     If you have questions about your legal rights, you may contact the county Patients? Rights Advocate at [phone] or [phone] (phone number of county Patients? Rights Metallurgist).   Good cause for Incomplete Advisement N/A Date N/A   Advisement Completed by  (Electronically signed by)  Merrie Roof Position  Resident physician Language or Modality Used  Spoken English Date  08/15/2020   CC: Original to the Patient   DHCS 1802 (03/2012) [place] to the Patient?s Record

## 2020-08-16 NOTE — Consults
ED Social Work Psychiatric Transfer Note    Financial clearance triggered per protocol.     Patient referred for psychiatric transfer; patient requires inpatient stabilization, but cannot be hospitalized in [facility] at this time.     Referrals (FS, 5150, Advisement, Transfer summary, labs) will be faxed to the following facilities, once labs and documentation are complete:     Hospital Telephone Notes   [facility] [phone]   AV residents only.    [facility] [phone]                      [facility] [phone]                                        [facility] [phone]    [facility] [phone]    [facility] [phone]    [facility] [phone]    [name] [phone]     [facility] [phone]    [facility] [phone]/ [phone]    [facility] [phone]/               [phone]    [facility])   [phone]    [facility] [phone]    [facility] [phone]    [facility] [phone]     [facility] [phone]    [facility] [phone]    [facility] [phone] Patients must live within 20 mile radius of hospital.      Psych/ED aware.  Suly Vukelich, LCSW

## 2020-08-16 NOTE — Other
[facility] Department of Mental Health - MH 302 NCR                                                 Department of Health Care Services  [place]                                       Health and Hospital doctor   APPLICATION FOR ASSESSMENT, EVALUATION, AND CRISIS INTERVENTION OR PLACEMENT FOR EVALUATION AND TREATMENT  Confidential Client/Patient Information  See [place] W&I Code, Section 5328 and HIPAA Privacy Rule 45 C.F.R. ? 29.508  Welfare and Institutions Code (W&I Code), Section 5150(f) and (g), require that each person, when first detained for psychiatric evaluation, be given certain specific information orally and a record be kept of the advisement by the evaluating facility. DETAINMENT ADVISEMENT  My name is ________________________  I am a (peace officer/mental health professional) with (name of agency).  You are not under criminal arrest, but I am taking you for examination by mental health professionals at (name of facility).     You will be told your rights by the mental health staff.     If taken into custody at his or her residence, the person shall also be told the following information:       [X]  Advisement Complete        [  ] Advisement Incomplete      You may bring a few personal items with you, which I will have to approve. Please inform me if you need assistance turning off any appliance or water. You may make a phone call and leave a note to tell your friends or family where you have been taken.   Good Cause for Incomplete Advisement:                                                                                               Advisement Completed By:  Merrie Roof        Position:   Resident physician        Language or Modality Used:  Spoken English        Date of Advisement:  08/15/2020          To (name of 5150 designated facility): [facility]    Application is hereby made for the assessment and evaluation of [name] [name]     residing at [place], [place], [place], [address], [place], for up to 72-hour assessment, evaluation, and crisis intervention or placement for evaluation and treatment at a designated facility pursuant to Section 5150, et seq. (adult) or Section 5585 et seq. (minor), of the W&I Code. If a minor, authorization for voluntary treatment is not available and to the best of my knowledge, the legally responsible party appears to be/is: (Check one):  []  Parent;  []   Legal Guardian;  []  Conservator;  []  Juvenile Court under W&I Code 300;  []  Juvenile Court under W&I Code 601/602.      If known, provide names, address and telephone numbers in area provided below:   N/A          The above person?s condition was called to my attention under the following circumstances:    [facility], consultation to Psychiatry        I have probable cause to believe that the person is, as a result of a mental health disorder, a danger to others or to himself/herself, or gravely disabled because: (state specific facts):   Patient is endorsing suicidal ideation with plan to cut his wrists or overdose on sleeping pills.      (CONTINUED ON NEXT PAGE)   Reference: DHCS 1801 (06/18)                                                                                                                     Page 1 of 4  Original Application: Sales executive to Assessment, Evaluation, and Crisis Intervention Location or 5150/5585 Designated Facility   [facility] Department of Mental Health - MH 302 NCR                                                 Department of Health Care Services  [place]                                       Health and Health and safety inspector Agency            CLIENT NAME: [name] [name]   APPLICATION FOR 72 HOUR DETENTION FOR EVALUATION AND TREATMENT (CONTINUED)     Historical course of the person?s mental disorder:  [  ] I have considered the historical course of the person?s mental disorder: [Includes evidence presented by service/support provider, family member(s), and person subject to probable cause determination or designee.]  N/A    [X]  No reasonable bearing on determination  [  ] No information available because: N/A       History Provided by (Name) Address Phone Number Relation                  Based upon the above information, there is probable cause to believe that said person is, as a result of mental health disorder:     [X]  A danger to himself/herself.  [  ] A danger to others.        [  ] Gravely disabled adult.  [  ] Gravely disabled minor.        Minors only: []  Based upon the above information, it appears that there is probable cause to believe that authorization  for voluntary treatment is not available.   Signature, title, and badge number of Tax adviser, professional person in charge of the facility designated by the county for evaluation and treatment, member of the attending staff, designated members of a mobile crisis team, or professional person designated by [employer].     [place] Badge/NPI#: [id number]   (Electronically signed)      Date: 08/15/2020       Phone:  [phone]        Time: 02:05 PM         Name of Law Enforcement Agency or Evaluation Facility/Person:    [facility] Address of Sales executive or Evaluation Facility/Person:   [address] [place], [place] [address]   For patients in Medical ER???s, detention began:  Date: N/A  Time: N/A           NOTIFICATIONS TO BE PROVIDED TO LAW ENFORCEMENT AGENCY     Notify (officer/unit & telephone #): N/A        NOTIFICATION OF PERSON???S RELEASE IS REQUESTED BY THE REFERRING PEACE OFFICER BECAUSE:   [  ] The person has been referred to the facility under circumstances which, based upon an allegation of facts regarding actions witnessed by the officer or another person, would support the filing of a criminal complaint.  []  Weapon was Engineer, drilling to Section 8102 W&I Code. Upon release, facility is required to provide notice to the person regarding the procedure to obtain return of any confiscated firearm pursuant to Section 8102 W&I Code.   Reference: [facility] 1801 (06/18)                                                                                                                     Page 2 of 4  Original Application: Accompany Client to Assessment, Evaluation, and Crisis Intervention Location or 5150/5585 Designated Facility   [facility] [employer] of Mental Health - MH 302 NCR                                                 Department of Health Care Services  [place]                                       Health and Health and safety inspector Agency   SEE SUBSEQUENT PAGES FOR DEFINITIONS AND REFERENCES     DEFINITIONS AND REFERENCES   ?Gravely Disabled? means a condition in which a person, as a result of a mental disorder, is unable to provided for his or her basic personal needs for food, clothing and shelter.  SECTION 5008(h) W&I Code    ?Gravely Disabled Minor? means a minor who, as a result of a mental disorder,  is unable to use the elements of life which are essential to health, safety, and development, including food, clothing, and shelter, even though provided to the minor by others.  Intellectual disability, epilepsy, or other developmental disabilities, alcoholism, other drug abuse, or repeated antisocial behavior do not, by themselves, constitute a mental disorder.  SECTION 5585.25 W&I Code    ?Tax adviser? means a duly Music therapist as that term is defined in Chapter 4.5 (commencing with Section 830) of Title 3 of Part 2 of the Penal Code who has completed the basic training course established by the Commission on [employer], or any Civil Service fast streamer or probation officer specified in Section 830.5 of the Penal Code when acting in relation to cases for which he or she has a legally mandated responsibility. SECTION 5008 (i) W&I Code    Section 5152.1 W&I Code: The professional person in charge of the facility providing 72-hour evaluation and treatment, or his or her designee, shall notify the county Secondary school teacher or the director's designee and the Tax adviser who makes the written application pursuant to Section 5150 or a person who is designated by the Patent examiner agency that employs the Tax adviser, when the person has been released after 72-hour detention, when the person is not detained, or when the person is released before the full period of allowable 72-hour detention if all the conditions apply:    (a)  The Tax adviser requests such notification at the time he or she makes the application and the Tax adviser certifies at that time in writing that the person has been referred to the facility under circumstances which, based upon an allegation of facts regarding actions witnessed by the officer or another person, would support the filing of a criminal complaint.   (b) The notice is limited to the person's name, address, date of admission for 72-hour evaluation and treatment, and date of release.        If a Emergency planning/management officer, Sales executive, or designee of the Sales executive, possesses any record of information obtained pursuant to the notification requirements of this section, the officer, agency, or designee shall destroy that record two years after the receipt of notification.     Section 5150.05 W&I Code:    (a)  When determining if probable cause exists to take a person into custody, or cause a person to be taken into custody, pursuant to Section 5150, any person who is authorized to take that person, or cause that person to be taken, into custody pursuant to that section shall consider available relevant information about the historical course of the person's mental disorder if the authorized person determines that the information has a reasonable bearing on the determination as to whether the person is a danger to others, or to himself or herself, or is gravely disabled as a result of the mental disorder.   (b) For purposes of this section, ''information about the historical course of the person's mental disorder'' includes evidence presented by the person who has provided or is providing mental health or related support services to the person subject to a determination described in subdivision (a), evidence presented by one or more members of the family of that person, and evidence presented by the person subject to a determination described in subdivision (a) or anyone designated by that person.      Reference: DHCS 1801 (06/18)  Page 3 of 4  Original Application: Sales executive to Assessment, Evaluation, and Crisis Intervention Location or 5150/5585 Designated Facility   [facility] Department of Mental Health - MH 302 NCR                                                 Department of Health Care Services  [place]                                       Health and Hospital doctor   DEFINITIONS AND REFERENCES (CONTINUED)    (c)  If the probable cause in subdivision (a) is based on the statement of a person other than the one authorized to take the person into custody pursuant to Section 5150, a member of the attending staff, or a professional person, the person making the statement shall be liable in a civil action for intentionally giving any statement that he or she knows to be false.   (d) This section shall not be applied to limit the application of Section 5328.          Section 5152.2 W&I Code: Each Patent examiner agency within a county shall arrange with the county Secondary school teacher a method for giving prompt notification to English as a second language teacher pursuant to Section 5152.1 W&I Code.     Section 5585.50 W&I Code: The facility shall make every effort to notify the minor's parent or legal guardian as soon as possible after the minor is detained. Section 5585.50 W&I Code.    A minor under the jurisdiction of the [employer] under Section 300 W&I Code, is due to abuse, neglect or exploitation.    A minor under the jurisdiction of the Juvenile Court under Section 601 W&I Code is due to being adjudged a ward of the court as a result of being out of parental control.    A minor under the jurisdiction of the Juvenile Court under Section 602 W&I Code is due to being adjudged a ward of the court because of crimes committed.     Section 8102 W&I Code (EXCERPTS FROM):    (a)  Whenever a person who has been detained or apprehended for examination of his or her mental condition or who is a person described in Section 8100 or 8103, is found to own, have in his or her possession or under his or her control, any firearm whatsoever, or any deadly weapon, the firearm or other deadly weapon shall be confiscated by any IT consultant, who shall retain custody of the firearm or other deadly weapon. ''Deadly weapon,'' as used in this section, has the meaning prescribed by Section 8100.   (b) (1) Upon confiscation of any firearm or other deadly weapon from a person who has been detained or apprehended for examination of his or her mental condition, the Tax adviser or law enforcement agency shall issue a receipt describing the deadly weapon or any firearm and listing any serial number or other identification on the firearm and shall notify the person of the procedure for the return, sale, transfer, or destruction of any firearm or other deadly weapon which has been confiscated. A Tax adviser or law enforcement agency that provides the receipt and notification  described in Section 33800 of the Penal Code satisfies the receipt and notice requirements.  (2) If the person is released, the professional person in charge of the facility, or his or her designee, shall notify the person of the procedure for the return of any firearm or other deadly weapon which may have been confiscated.  (3) Health facility personnel shall notify the confiscating law enforcement agency upon release of the detained person, and shall make a notation to the effect that the facility provided the required notice to the person regarding the procedure to obtain return of any confiscated firearm.        Health and Safety Code [phone] (d)     A person detained under this section in a medical emergency room shall be credited for the time detained, up to twenty-four hours, in the event he or she is placed on a 72-hour hold pursuant to Section 5150 of the Welfare and Institutions Code.     Reference: DHCS 1801 (06/18)                                                                                                                     Page 4 of 4  Original Application: Accompany Client to Assessment, Evaluation, and Crisis Intervention Location or 5150/5585 Designated Facility

## 2020-08-16 NOTE — ED Notes
Dinner tray provided, patient tolerated well. Patient appears calm and cooperative, responds to questions appropriately. Denies any pain or any complaint at this time. Plan of care discussed, suicide precaution remains in effect. Will continue to monitor
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
## 2020-08-21 ENCOUNTER — Emergency Department
Admission: EM | Admit: 2020-08-21 | Discharge: 2020-08-21 | Disposition: A | Discharge status: Other Acute Inpatient Hospital | Payer: Medicaid HMO | Attending: Emergency Medicine | Admitting: Emergency Medicine

## 2020-08-21 DIAGNOSIS — Z79899 Other long term (current) drug therapy: Secondary | ICD-10-CM | POA: Insufficient documentation

## 2020-08-21 DIAGNOSIS — F1290 Cannabis use, unspecified, uncomplicated: Secondary | ICD-10-CM | POA: Insufficient documentation

## 2020-08-21 DIAGNOSIS — R451 Restlessness and agitation: Secondary | ICD-10-CM | POA: Insufficient documentation

## 2020-08-21 DIAGNOSIS — I10 Essential (primary) hypertension: Secondary | ICD-10-CM | POA: Insufficient documentation

## 2020-08-21 DIAGNOSIS — T402X2A Poisoning by other opioids, intentional self-harm, initial encounter: Secondary | ICD-10-CM | POA: Insufficient documentation

## 2020-08-21 DIAGNOSIS — Z8673 Personal history of transient ischemic attack (TIA), and cerebral infarction without residual deficits: Secondary | ICD-10-CM | POA: Insufficient documentation

## 2020-08-21 DIAGNOSIS — I2510 Atherosclerotic heart disease of native coronary artery without angina pectoris: Secondary | ICD-10-CM | POA: Insufficient documentation

## 2020-08-21 DIAGNOSIS — I252 Old myocardial infarction: Secondary | ICD-10-CM | POA: Insufficient documentation

## 2020-08-21 DIAGNOSIS — F319 Bipolar disorder, unspecified: Secondary | ICD-10-CM | POA: Insufficient documentation

## 2020-08-21 DIAGNOSIS — F6381 Intermittent explosive disorder: Secondary | ICD-10-CM | POA: Insufficient documentation

## 2020-08-21 DIAGNOSIS — Z7982 Long term (current) use of aspirin: Secondary | ICD-10-CM | POA: Insufficient documentation

## 2020-08-21 DIAGNOSIS — Z888 Allergy status to other drugs, medicaments and biological substances status: Secondary | ICD-10-CM | POA: Insufficient documentation

## 2020-08-21 DIAGNOSIS — Z885 Allergy status to narcotic agent status: Secondary | ICD-10-CM | POA: Insufficient documentation

## 2020-08-21 DIAGNOSIS — F209 Schizophrenia, unspecified: Secondary | ICD-10-CM | POA: Insufficient documentation

## 2020-08-21 DIAGNOSIS — Z8249 Family history of ischemic heart disease and other diseases of the circulatory system: Secondary | ICD-10-CM | POA: Insufficient documentation

## 2020-08-21 DIAGNOSIS — F17210 Nicotine dependence, cigarettes, uncomplicated: Secondary | ICD-10-CM | POA: Insufficient documentation

## 2020-08-21 DIAGNOSIS — Z5900 Homelessness unspecified: Secondary | ICD-10-CM | POA: Insufficient documentation

## 2020-08-21 MED ORDER — NITROGLYCERIN 0.4 MG SL SUBL
0.40 mg | SUBLINGUAL_TABLET | SUBLINGUAL | Status: DC
Start: ? — End: 2020-08-21

## 2020-08-21 MED ORDER — MORPHINE SULFATE (PF) 4 MG/ML IV SOLN
4.00 mg | INTRAVENOUS | Status: DC
Start: ? — End: 2020-08-21

## 2020-08-21 NOTE — ED Notes (Addendum)
Endorsed handoff report to [facility][id number] to [employer]. Patient safely transferred into medic gurney. Discharge paperwork with original copy of 5150 provided to medic.

## 2020-08-21 NOTE — ED Notes (Signed)
ER MD at bedside

## 2020-08-21 NOTE — Discharge Instructions (Signed)
You have been evaluated in the Emergency Department today for your psychiatric complaint. You were evaluated by both Emergency Medicine and medically cleared to return to [facility].    Return to the Emergency Department if you experience thoughts of hurting yourself or others, audio or visual hallucinations, or for any other concerning symptoms.

## 2020-08-21 NOTE — EMS Narrative (Addendum)
Alert:  [id number]  [id number]  [id number]  M236  ALS-Paramedic  Incident Location Type: Y92.531  [address]  [id number]  [phone]    CC:    Impression:  Primary Symptom: R45.851 - Suicidal ideations  Provider's Primary Impression: F19 - Other psychoactive substance abuse,   uncomplicated  [facility])    HPI:Pt Age: 50 Years; Gender: Male; Is there any reason to suspect patient   may HAVE or may have been EXPOSED to COVID-19: No; Provide specific details   for suspecting patient may HAVE or have been EXPOSED to COVID-19: ;Primary   Impression: Drug Overdose/Poisoning;M236 arrived on scene with E20 to find   a 50 year old male standing upright at [facility] with staff on scene. Patient   chief complaint is overdose, SI, 5150. Medical History: CV -   Atherosclerosis, Behavior: Bipolar Disorder, Hypertension (HTN), Blood:   Hyperlipidemia (high cholesterol); Pt Medication: lisinopril (PRINIVIL,   ZESTRIL) 20 MG tablet, atorvastatin (LIPITOR) 20 mg tablet, aspirin 81 mg   chewable tablet, atorvastatin (LIPITOR) 10 mg tablet, aspirin 81 mg tablet,   Lisinopril (PRINIVIL/ZESTRIL) 10 mg Oral Tab, nitroGLYcerin (NITROSTAT)   0.4 mg SL tablet, tetrahydrozoline-zinc (VISINE-AC) 0.05-0.25 % ophthalmic   solution; Pt Allergies: Diphenhydramine, Ketorolac, Tramadol, ;Patient has   voices telling him to kill himself. Patient took 6-8 morphine and codeine   in an attempt to kill himself this morning at 0600. Patient went to [facility] to   check himself in. Physician there placed patient on 5150 hold for danger to   self and others. Date/Time: 08/21/2020 17:31:48; Mental Status: Normal   Baseline for Patient; Neuro: Normal Baseline for Patient; Eye Lt: PERRL,   4-mm; Eye Rt: PERRL, 4-mm; Prior Care: No; Crew: Cristino Martes; Patient   alert and oriented fully. Patient denies LOC, chest pain, abdomen pain,   dyspnea, dizziness, headaches, nausea, vomiting, diarrhea, Assessment,   vitals, 4 lead ekg to rule out cardiac abnormalities, oxygen  saturation to   rule out hypoxia, stroke scale, temperature, 4 point soft restraints for   crew, hospital, and patient safety. Comfort measures without changes in   patient symptoms or condition. Base Called: [facility]SD[facility], code 40. Patient care turned over to receiving   facility staff.     Medical History:  F31.9 - Bipolar disorder, unspecified  I25.119 - Atherosclerotic heart disease of native coronary artery with   unspecified angina pectoris  E78.5 - Hyperlipidemia, unspecified  I10 - Essential (primary) hypertension    Allergies to Medications:  3498 - Diphenhydramine  59741 - Ketorolac  10689 - Tramadol    Environmental and Food Allergies:    Current Medications:  318272 - Aspirin 81 MG Chewable Tablet  314077 - Lisinopril 20 MG Oral Tablet  617310 - Atorvastatin calcium 20 MG Oral Tablet  314076 - Lisinopril 10 MG Oral Tablet  638453 - Atorvastatin calcium 10 MG Oral Tablet  646803 - Aspirin 81 MG Enteric Coated Tablet  212248 - Nitroglycerin 0.4 MG Sublingual Tablet  2500370 - Tetrahydrozoline hydrochloride 0.5 MG/ML / Zinc Sulfate 2.5 MG/ML   Ophthalmic Solution    Assessment:  Normal  Normal Baseline for Patient    Cardiac Arrest:  No    EMS Airway:    EMS Medications:    EMS Procedures:  Date/Time Procedure Performed: 2022-06-07T17:19:00.000-07:00  Procedure: 3-Lead ECG Monitored  Procedure Complication: None  Response to Procedure: Unchanged  Date/Time Procedure Performed: 2022-06-07T17:19:00.000-07:00  Procedure: Pulse Oximetry Monitored  Procedure Complication: None  Response to Procedure: Unchanged  Date/Time Procedure Performed: 2022-06-07T17:20:00.000-07:00  Procedure Performed Prior to this Unit's EMS Care: No  Procedure: Physical Restraints Utilized  Procedure Complication: None  Response to Procedure: Unchanged    EMS Exam:    Patient Demographics:  Last Name: Montecalvo  First Name: [facility]  Patient's Home Address: [address] [facility]  Patient's Home [place]:  [id number]  Patient's Home [place]: [phone]  Patient's Home State: 06  Patient's Home ZIP Code: [id number]  Patient's Country of Residence: [place]  Gender: Male  Race: White  Age: 75  Age Units: Years    Demographics Practitioner:    Advanced Directives:    Demographics Payment:  Insurance  Medi-Cal  [place]  Merchant navy officer    Incident Times:  Unit Notified by Dispatch Date/Time: 2022-06-07T17:09:06.000-07:00  Unit En Route Date/Time: 2022-06-07T17:09:27.000-07:00  Unit Arrived on Scene Date/Time: 2022-06-07T17:12:39.000-07:00  Arrived at Patient Date/Time: 2022-06-07T17:18:00.000-07:00  Unit Left Scene Date/Time: 2022-06-07T17:24:29.000-07:00  Patient Arrived at Destination Date/Time: 2022-06-07T17:44:29.000-07:00

## 2020-08-21 NOTE — ED Notes (Signed)
Original copy of 5150 DTS handed to ED [facility].

## 2020-08-21 NOTE — ED Notes (Addendum)
This RN called [phone] and made [facility] staff member Rodman Pickle aware that patient is medically cleared to go back to [facility], and that BLS Transport ETA is at 2330.

## 2020-08-21 NOTE — ED Notes (Addendum)
Pt change into psych gown. Pt belongings done and stored in psych locker and with security. He is alert, agitated, and upset. Pt requested ice chips and water. Sitter at beside.

## 2020-08-21 NOTE — ED Provider Notes (Signed)
Emergency Department Note  [place] electronic medical record reviewed for pertinent medical history.     PatientHOLGER [name], MRN [id number], DOB 14-May-1970  The Date of Service for the Emergency Room encounter is 08/21/2020  7:25 PM     Nursing Triage Note :   Chief Complaint   Patient presents with   . Overdose-intentional     Patient sent from [facility], on 5150 for DTS, for medical clearance to return to [facility]. He states he took "between 6-8 morphine and codeine" tablets this morning in an attempt at self harm. Pt arrives via EMS in restraints which he requested the medics apply due to his "intermittent explosive disorder". He is alert, oriented x4, mildly agitated that he isn't being immediately served ice chips.        HPI :   [name] [name] is a 50 year old male with PMHx as below notable for schizophrenia, bipolar, CAD, CVA presenting to emergency department today to be medically cleared for [facility].  Patient states that earlier this morning he took 5 morphine 5 mg tablets in attempted to kill himself.  He was unable to keep these down and had an episode of emesis.  Patient then went to [facility] for evaluation to be checked into a care facility.  They sent the patient here for medical clearance.      Patient was not cooperative with many of the writers questions    No other medical complaints at this time. Patient denies fevers, chills, headache, changes in vision, neck pain, shortness of breath, chest pain, nausea, vomiting, diaphoresis, abdominal pain, constipation, diarrhea, urinary symptoms, muscle aches.      Past Medical History :   Past Medical History:   Diagnosis Date   . ACS (acute coronary syndrome) (CMS-HCC)    . Heart murmur    . Hypertension    . Myocardial infarction (CMS-HCC)        Past Surgical history :   Past Surgical History:   Procedure Laterality Date   . back surgery     . knee surgery     . surgery to the skull  from fracture       Family History:   Family History   Problem  Relation Name Age of Onset   . Heart Attack Father         Social History:  Social History     Socioeconomic History   . Marital status: Single   Tobacco Use   . Smoking status: Current Every Day Smoker     Packs/day: 0.25     Years: 25.00     Pack years: 6.25   . Smokeless tobacco: Never Used   Substance and Sexual Activity   . Alcohol use: No   . Drug use: Yes     Types: Marijuana     Alcohol: Denies  Tobacco: Denies  Illicit drugs: MJ  Homeless    Medications:   Prior to Admission Medications   Prescriptions Last Dose Informant Patient Reported? Taking?   aspirin 81 MG chewable tablet   No No   Sig: Take 1 tablet by mouth daily.   atorvastatin (LIPITOR) 20 MG tablet   No No   Sig: Take 1 tablet by mouth every evening.   lisinopril (PRINIVIL, ZESTRIL) 10 MG tablet   Yes No   Sig: Take 10 mg by mouth nightly.      Facility-Administered Medications: None       Allergies: Toradol and Tramadol  Review of Systems:  All other systems reviewed and negative unless otherwise noted in the HPI or above. This was done per my custom and practice for systems appropriate to the chief complaint in an emergency department setting and varies depending on the quality of history that the patient is able to provide.        Physical Exam:   08/21/20  1942   BP: 118/63   Pulse: 79   Resp: 17   Temp: 98.2 F (36.8 C)   SpO2: 99%       Physical Exam  Constitutional:       Appearance: Normal appearance.   HENT:      Head: Normocephalic and atraumatic.      Right Ear: External ear normal.      Left Ear: External ear normal.      Nose: Nose normal.      Mouth/Throat:      Mouth: Mucous membranes are moist.      Pharynx: Oropharynx is clear.   Eyes:      Extraocular Movements: Extraocular movements intact.      Pupils: Pupils are equal, round, and reactive to light.   Cardiovascular:      Rate and Rhythm: Normal rate and regular rhythm.      Pulses: Normal pulses.      Heart sounds: Normal heart sounds.   Pulmonary:      Effort: Pulmonary  effort is normal.      Breath sounds: Normal breath sounds.   Abdominal:      General: Abdomen is flat. Bowel sounds are normal.      Palpations: Abdomen is soft.   Musculoskeletal:         General: Normal range of motion.      Cervical back: Normal range of motion and neck supple.   Skin:     General: Skin is warm.      Capillary Refill: Capillary refill takes less than 2 seconds.   Neurological:      General: No focal deficit present.      Mental Status: He is alert and oriented to person, place, and time.   Psychiatric:         Mood and Affect: Mood is anxious. Affect is angry.         Speech: Speech is rapid and pressured.         Behavior: Behavior is agitated.         Thought Content: Thought content includes suicidal ideation.         Judgment: Judgment is impulsive and inappropriate.           ED Course & Clinical Decision Making:  50 year old  male with PMHx as listed in HPI presents suicidal ideation after taking 5 5 mg morphine tabs.  Patient is here for medical clearance.  Patient states that this morning he took 5 5 mg working tabs.  He vomited after this.  He then went to [facility] for further evaluation.  They sent him here for medical clearance.  On arrival patient was hemodynamically stable, regular rate rhythm, lungs are clear to auscultation bilaterally, abdomen soft nontender.  Patient was agitated, angry, anxious.  He deferred any further history.  Given patient is hemodynamically stable, and we were able to monitor him for 3 hours we will discharge him.  Patient will be transferred to [facility] for psychiatric stabilization.  Patient is medically clear for psych.        Orders:  No orders of the defined types were placed in this encounter.      Medications ordered:  Medications - No data to display    Diagnostic Imaging Studies  No orders to display       ED Course  ED Course as of 08/21/20 2225   [facility] Documentation   Tue Aug 21, 2020   2016 SpO2: 99 %   2016 Respirations: 17   2016 Heart  Rate: 79   2016 Temperature: 98.2 F (36.8 C)   2016 Blood pressure (BP): 118/63      Others' Documentation   Tue Aug 21, 2020   2224 SO DW 48 M w/ intermittent explosive disorder, took 5 X 5mg  morphine tabs, med cleared. BLS at 11:30. Unsure if on hold but cannot leave.  [RS]      ED Course User Index  [RS] Shrake, , MD             I have discussed my thought process and plan for the patient with the attending physician, Ronnell Guadalajara, MD.    Delman Kitten, MD  Emergency Medicine, PGY-1  [facility]              [facility], MD  Resident  08/21/20 10/21/20       [id number], MD  08/26/20 2117

## 2020-08-23 LAB — CMP (EXT)
ALT/SGPT (EXT): 21 U/L (ref <=49)
AST/SGOT (EXT): 19 U/L (ref 17–59)
Albumin (EXT): 3.9 g/dL (ref 3.5–5.0)
Alkaline Phosphatase (EXT): 81 U/L (ref 38–126)
Anion Gap (EXT): 7 mmol/L (ref 6–14)
BUN (EXT): 18 mg/dL (ref 9–20)
Bilirubin, Total (EXT): 0.3 mg/dL (ref 0.2–1.3)
CO2 (EXT): 22 mmol/L (ref 22–30)
CalciumCalcium (EXT): 8.7 mg/dL (ref 8.4–10.2)
Chloride (EXT): 109 mmol/L — ABNORMAL HIGH (ref 98–107)
Creatinine (EXT): 0.5 mg/dL — ABNORMAL LOW (ref 0.70–1.30)
Glucose (EXT): 132 mg/dL — ABNORMAL HIGH (ref 70–125)
Osmolality (EXT): 290 mosm/kg (ref 280–305)
Potassium (EXT): 4.1 mmol/L (ref 3.5–5.1)
Protein (EXT): 6.5 g/dL (ref 6.3–8.2)
Sodium (EXT): 138 mmol/L (ref 137–145)
eGFR - Creat CKD-EPI (EXT): 126 mL/min/{1.73_m2} (ref >60)

## 2020-08-26 LAB — UNMAPPED LAB RESULTS
ALT/SGPT (EXT): 29 U/L (ref <=65)
AST/SGOT (EXT): 12 U/L (ref <=38)
Albumin (EXT): 3.3 g/dL (ref 2.8–4.7)
Alkaline Phosphatase (EXT): 94 U/L (ref <=138)
Bilirubin, Direct (EXT): 0.1 mg/dL (ref <=0.3)
Bilirubin, Total (EXT): 0.4 mg/dL (ref <=1.0)
Protein (EXT): 6.1 g/dL (ref 6.0–8.7)

## 2020-08-26 LAB — LAB EXTERNAL RESULT UNMAPPED: COVID-19 CARE EVERYWHERE: NOT DETECTED

## 2020-08-26 LAB — PROTHROMBIN TIME CARE EVERYWHERE
INR CARE EVERYWHERE: 1.1 (ref 0.8–1.2)
PROTHROMBIN TIME CARE EVERYWHERE: 14.1 s (ref 12.5–15.0)

## 2020-08-29 NOTE — ED Provider Notes (Signed)
 Formatting of this note is different from the original.  Emergency Department Note:  Provider exam time/date: 9:58 AM on 08/29/2020    Chief Complaint: ABDOMINAL PAIN (RLQ pain for the past few days. +nausea. Denies any vomiting, diarrhea or recent covid contacts in the past 14 days esi 3 bhomen rn )    Chief Complaint   Patient presents with   ? ABDOMINAL PAIN     RLQ pain for the past few days. +nausea. Denies any vomiting, diarrhea or recent covid contacts in the past 14 days esi 3 bhomen rn      Patient seen during COVID-19 Pandemic.    HPI:  Miguel Carr is a 34 Y male with Past Medical History as below who presents with RLQ abd pain that started this AM a/w nausea. Non-radiating, denies vomiting. Endorses normal BM earlier today. Denies UTI symptoms, f/c. Endorses THC use only, no other drug use, denies EtOH use. Pt is homeless.    Notably, chart review via CareEverywhere shows pt has presented to multiple [facility] over past 2 weeks with presentations for RLQ abd pain, chest pain, opioid overdose.    Per Dr. Deberah Pelton' ED note 08/23/20 from [facility] via CareEverywhere:  Medical Decision Making    Patient coming in with right lower quadrant abdominal pain. Difficult to assess abdomen with him yelling. Differentials included appendicitis, hepatitis, gastritis, drug-seeking behavior, hyperesthesia from methamphetamines. Blood work, Tylenol, CT abdomen pelvis was ordered.    Patient refused Tylenol so was given GI cocktail and Pepcid. Blood work is unremarkable. CT abdomen pelvis did not show any acute pathology however did show constipation. Patient be discharged with an enema, magnesium citrate, and MiraLAX. Return as needed.    Examined with eye protection, N95 mask, and gloves.    PMH:  Patient Active Problem List:     CHEST PAIN    HOMELESSNESS, UNSPECIFIED    CHRONIC PAIN    TOBACCO SMOKER    DRUG SEEKING BEHAVIOR    AMA (LEFT AFTER SEEING PROVIDER).    ISCHEMIC STROKE.    HTN  (HYPERTENSION)    CONVERSION DISORDER W WEAKNESS OR PARALYSIS    *OTHER MR # EXISTS    HTN (HYPERTENSION)    CAD (CORONARY ARTERY DISEASE) WO ANGINA    CAD WO ANGINA, W HX OF MI    CHEST PAIN    TOBACCO SMOKER    CONVERSION DISORDER    HX OF ISCHEMIC STROKE    ABNL ABDOMINAL IMAGING    *OTHER MR # EXISTS    Med:   No outpatient medications have been marked as taking for the 08/29/20 encounter [facility] Encounter).     ALL Allergies: Adhesive tape, Toradol [ketorolac], Toradol [ketorolac], Tramadol, and Tramadol  Social Hx:     Social History     Socioeconomic History   ? Marital status: Divorced         BP 96/62   Pulse 89   Temp 100.4 F (38 C)   Resp 18   Ht 1.854 m (6\' 1" )   Wt 74.8 kg (165 lb)   SpO2 98%   BMI 21.77 kg/m   Review of Systems   Constitutional: Negative for chills and fever.   Respiratory: Negative for cough.    Cardiovascular: Negative for chest pain.   Gastrointestinal: Positive for abdominal pain and nausea. Negative for vomiting.   Genitourinary: Negative for dysuria.   Musculoskeletal: Negative for myalgias.   Skin: Negative for rash.   Neurological:  Negative for dizziness and headaches.     Physical Exam  Vitals and nursing note reviewed.   Constitutional:       General: He is not in acute distress.     Appearance: He is well-developed. He is not diaphoretic.      Comments: VS:  BP 96/62   Pulse 89   Temp 100.4 F (38 C)   Resp 18   Ht 1.854 m (6\' 1" )   Wt 74.8 kg (165 lb)   SpO2 98%   BMI 21.77 kg/m     Patient is comfortable-appearing.   HENT:      Head: Normocephalic and atraumatic.      Right Ear: External ear normal.      Left Ear: External ear normal.      Nose: Nose normal.   Eyes:      Conjunctiva/sclera: Conjunctivae normal.   Cardiovascular:      Rate and Rhythm: Normal rate and regular rhythm.   Pulmonary:      Effort: Pulmonary effort is normal. No respiratory distress.      Breath sounds: Normal breath sounds.   Abdominal:      Palpations: Abdomen is soft.       Tenderness: There is abdominal tenderness. There is guarding. There is no rebound.      Comments: RLQ TTP, guarding.    Musculoskeletal:         General: Normal range of motion.      Cervical back: Normal range of motion and neck supple.      Comments: No clubbing noted.   Skin:     General: Skin is warm and dry.      Capillary Refill: Capillary refill takes less than 2 seconds.   Neurological:      Mental Status: He is alert.      Comments: Cranial nerves 2-12 grossly intact.         Prior records reviewed.    MDM:  Suspect malingering, doubt emergent abd pathology.    Plan:   -CBC, CMP, UA, UDS, CT AP, no pain meds unless confirmed pathology on workup, npo    ED Course:  10:53 AM  Pt now reports his pain is much better after "passing gas". Now 2/10. Repeat exam still with mild TTP, but significant improved compared to prior. Now declining CT because he "does not want that much radiation". CT cancelled.    Review of relevant labwork:  CBC, CMP unremarkable.    Dc with food (per pt request) - pt declined SW consult.    Patient instructed to f/u with PCP through provided clinic list. Return precautions given.    ED DISCHARGE DIAGNOSIS:   Abdominal pain.    Disposition: Discharge.  Condition on leaving ED: Stable.   Patient and/or Family agree with plan.    Karleen Hampshire MO MD  Electronically signed by Ilean China (M.D.) at 08/29/2020 12:20 PM PDT

## 2020-09-01 LAB — COVID-19 POC CARE EVERYWHERE: COVID-19 POC CARE EVERYWHERE: NEGATIVE

## 2020-11-26 ENCOUNTER — Inpatient Hospital Stay
Admit: 2020-11-26 | Discharge: 2020-11-26 | Disposition: A | Discharge status: Home / Self Care | Payer: Medicaid - Out of State | Attending: Emergency Medicine

## 2020-11-26 ENCOUNTER — Emergency Department: Admit: 2020-11-26 | Payer: Medicaid - Out of State

## 2020-11-26 DIAGNOSIS — M25511 Pain in right shoulder: Secondary | ICD-10-CM

## 2020-11-26 MED ORDER — HYDROCODONE-ACETAMINOPHEN 5 MG-325 MG TAB
5-325 mg | ORAL | Status: AC
Start: 2020-11-26 — End: 2020-11-26
  Administered 2020-11-26: 17:00:00 via ORAL

## 2020-11-26 MED FILL — HYDROCODONE-ACETAMINOPHEN 5 MG-325 MG TAB: 5-325 mg | ORAL | Qty: 1

## 2020-11-26 NOTE — ED Notes (Signed)
SW arranged transport.  Discussed follow up care, pt verbalized understanding.  Pt ambulatory to WR to wait for ride.

## 2020-11-26 NOTE — ED Provider Notes (Signed)
ED Provider Notes by Hewitt Shorts, NP at 11/26/20 1235                Author: Hewitt Shorts, NP  Service: EMERGENCY  Author Type: Nurse Practitioner       Filed: 11/26/20 1408  Date of Service: 11/26/20 1235  Status: Attested           Editor: Hewitt Shorts, NP (Nurse Practitioner)  Cosigner: Cam Hai, MD at 11/27/20 [phone]          Attestation signed by Cam Hai, MD at 11/27/20 [phone]          I personally saw and examined the patient.  I have reviewed and agree with the MLP's findings, including all diagnostic interpretations, and plans as written.    I was present during the key portions of separately billed procedures.     Cam Hai, MD                               Patient with past medical history of hypertension, MI presenting for evaluation of right shoulder pain.  Reports that  he has previously had a clavicular fracture that was surgically repaired approximately 5 years ago.  Has been diagnosed with rotator cuff tear, impingement syndrome, and arthritis.  Felt that his pain in his shoulder acutely worsened this morning.  Reports  decreased range of motion.  Also endorses numbness and tingling to his lower arm.  Describes the pain as severe.  Has not taken anything at home for the pain.  Has applied sling at home.  No other complaints.                        Past Medical History:        Diagnosis  Date         ?  Hypertension           ?  Myocardial infarct [facility])  2013           No past surgical history on file.        No family history on file.        Social History          Socioeconomic History         ?  Marital status:  DIVORCED              Spouse name:  Not on file         ?  Number of children:  Not on file     ?  Years of education:  Not on file     ?  Highest education level:  Not on file       Occupational History        ?  Not on file       Tobacco Use         ?  Smoking status:  Never     ?  Smokeless tobacco:  Not on file       Substance and Sexual  Activity         ?  Alcohol use:  No     ?  Drug use:  No             Comment: "used to use medical marijuana"         ?  Sexual activity:  Not on file  Other Topics  Concern        ?  Not on file       Social History Narrative        ?  Not on file          Social Determinants of Health          Financial Resource Strain: Not on file     Food Insecurity: Not on file     Transportation Needs: Not on file     Physical Activity: Not on file     Stress: Not on file     Social Connections: Not on file     Intimate Partner Violence: Not on file       Housing Stability: Not on file              ALLERGIES: Reglan [metoclopramide], Toradol [ketorolac tromethamine], and Tramadol      Review of Systems    Constitutional:  Negative for fever and unexpected weight change.    HENT:  Negative for congestion.     Eyes:  Negative for visual disturbance.    Respiratory:  Negative for cough, chest tightness and shortness of breath.     Cardiovascular:  Negative for chest pain and palpitations.    Gastrointestinal:  Negative for abdominal pain, nausea and vomiting.    Endocrine: Negative for polyuria.    Genitourinary:  Negative for dysuria and flank pain.    Musculoskeletal:  Positive for arthralgias. Negative for back pain.    Skin:  Negative for color change.    Allergic/Immunologic: Negative for immunocompromised state.    Neurological:  Positive for numbness. Negative for facial asymmetry and speech difficulty.    Hematological:  Negative for adenopathy.    Psychiatric/Behavioral:  Negative for agitation.          Vitals:          11/26/20 1225        BP:  131/84     Pulse:  97     Resp:  16     Temp:  98.5 ??F (36.9 ??C)     SpO2:  97%     Weight:  76 kg (167 lb 8.8 oz)        Height:  6' (1.829 m)                Physical Exam   Vitals and nursing note reviewed.    Constitutional:        General: He is not in acute distress.      Appearance: Normal appearance. He is normal weight.    HENT:       Head: Atraumatic.    Eyes:        Conjunctiva/sclera: Conjunctivae normal.       Pupils: Pupils are equal, round, and reactive to light.     Cardiovascular:       Rate and Rhythm: Normal rate.    Pulmonary:       Effort: Pulmonary effort is normal. No respiratory distress.     Abdominal:       General: Abdomen is flat.     Musculoskeletal:       Right shoulder: Tenderness, bony tenderness  and crepitus present. No swelling or deformity. Decreased range of motion . Decreased strength. Normal pulse.       Cervical back: Neck supple.    Skin:      General: Skin is warm.       Capillary Refill: Capillary  refill takes less than 2 seconds.    Neurological:       General: No focal deficit present.       Mental Status: He is alert and oriented to person, place, and time. Mental status is at baseline.    Psychiatric:          Mood and Affect: Mood normal.          Behavior: Behavior normal.           MDM   Number of Diagnoses or Management Options   Impingement syndrome of right shoulder   Pain in joint of right shoulder   Diagnosis management comments: Patient presenting with right shoulder pain.  Reports weakness to right arm.   Has had multiple stroke work-ups in the past with normal CT head and CTA of head and neck, no evidence of occlusion on last CTA.  He is otherwise  neurologically intact.  I strongly believe this is possible rotator cuff injury, impingement syndrome, or osteoarthritis.  Will obtain x-ray for further evaluation.      1400 -x-ray with no abnormalities.  Suspect this is related to rotator cuff injury versus impingement syndrome.  Reevaluated patient at bedside and he is now able to perform range of motion and arm without difficulty.  Will place in sling for comfort.   Follow-up with orthopedics.  Patient is also requesting to talk to a Child psychotherapist regarding a place to stay in a shelter.  Will consult case management.  Discussed my clinical impression(s), any labs and/or radiology results with the patient. I answered  any questions  and addressed any concerns. Discussed the importance of following up with their primary care physician and/or specialist(s). Discussed signs or symptoms that would warrant return back to the ER for further evaluation. The patient is agreeable  with discharge.          Amount and/or Complexity of Data Reviewed   Tests  in the radiology section of CPT??: ordered and reviewed                Procedures

## 2020-11-26 NOTE — Progress Notes (Signed)
2:37 PM  CM consult received.  Patient requesting assistance with transport to [facility] located at [address] in [place], [place] [address].  Patient's brother is with him.  Both patient and brother are homeless.  No insurance listed for transport.  CM received approval for transport via [employer] for patient and his brother.  Patient to be picked up at [facility] ED Entrance.  Driver to contact [facility] directly at [phone] in route and upon arrival.  RN notified of updates.  There are no other CM consults or needs at this time.    Alcario Drought, MSW/CRM  Care Management

## 2020-11-26 NOTE — ED Notes (Signed)
C/o sudden onset R shoulder pain at 10 this am.  Hx fx of collarbone approx five years ago, some pain since.  Today c/o numbness to arm and pain in shoulder, denies injury.

## 2020-11-26 NOTE — ED Notes (Signed)
Pt states he would like to talk to social work/case management.  Contacted, will be available approx 1415.

## 2020-11-27 LAB — RESPIRATORY PANEL BASIC CARE EVERYWHERE
COVID-19 CARE EVERYWHERE: NOT DETECTED
INFLUENZA A CARE EVERYWHERE: NOT DETECTED
INFLUENZA B CARE EVERYWHERE: NOT DETECTED
RSV CARE EVERYWHERE: NOT DETECTED

## 2020-11-27 LAB — PROTHROMBIN TIME CARE EVERYWHERE
INR CARE EVERYWHERE: 1.1 (ref 0.8–1.2)
PROTHROMBIN TIME CARE EVERYWHERE: 11.9 s (ref 9.0–13.0)

## 2020-11-27 LAB — HEMOGLOBIN A1C CARE EVERYWHERE
ESTIMATED AVERAGE GLUCOSE CARE EVERYWHERE: 120 mg/dL (ref <135)
HEMOGLOBIN A1C CARE EVERYWHERE: 5.8 %

## 2020-11-27 LAB — APTT CARE EVERYWHERE: APTT CARE EVERYWHERE: 31.2 s (ref 25.0–38.5)

## 2020-11-27 LAB — HEMOGLOBIN A1C - EXTERNAL
Est Avg Gluc: 120 mg/dL (ref ?–135)
Hemoglobin A1C: 5.8 %

## 2020-12-02 ENCOUNTER — Emergency Department: Admit: 2020-12-02 | Payer: Medicaid - Out of State

## 2020-12-02 ENCOUNTER — Inpatient Hospital Stay
Admit: 2020-12-02 | Discharge: 2020-12-03 | Discharge status: Against Medical Advice | Payer: Medicaid - Out of State | Source: Intra-hospital | Attending: Anesthesiology | Admitting: Anesthesiology

## 2020-12-02 DIAGNOSIS — G43109 Migraine with aura, not intractable, without status migrainosus: Secondary | ICD-10-CM

## 2020-12-02 DIAGNOSIS — I639 Cerebral infarction, unspecified: Secondary | ICD-10-CM

## 2020-12-02 LAB — HEMOGLOBIN A1C
Estimated Average Glucose mg/dL (INT/EXT): 120 mg/dL
HEMOGLOBIN A1C % (INT/EXT): 5.8 % — ABNORMAL HIGH (ref 4.0–5.6)

## 2020-12-02 LAB — CBC
Hematocrit: 36.9 % (ref 36.6–50.3)
Hemoglobin: 11.8 g/dL — ABNORMAL LOW (ref 12.1–17.0)
MCH: 25.1 PG — ABNORMAL LOW (ref 26.0–34.0)
MCHC: 32 g/dL (ref 30.0–36.5)
MCV: 78.3 FL — ABNORMAL LOW (ref 80.0–99.0)
MPV: 8.9 FL (ref 8.9–12.9)
NRBC Absolute: 0 10*3/uL (ref 0.00–0.01)
Nucleated RBCs: 0 PER 100 WBC
Platelets: 299 10*3/uL (ref 150–400)
RBC: 4.71 M/uL (ref 4.10–5.70)
RDW: 17.4 % — ABNORMAL HIGH (ref 11.5–14.5)
WBC: 5.7 10*3/uL (ref 4.1–11.1)

## 2020-12-02 LAB — BASIC METABOLIC PANEL
Anion Gap: 4 mmol/L — ABNORMAL LOW (ref 5–15)
BUN: 20 MG/DL (ref 6–20)
Bun/Cre Ratio: 36 — ABNORMAL HIGH (ref 12–20)
CO2: 25 mmol/L (ref 21–32)
Calcium: 8.5 MG/DL (ref 8.5–10.1)
Chloride: 110 mmol/L — ABNORMAL HIGH (ref 97–108)
Creatinine: 0.55 MG/DL — ABNORMAL LOW (ref 0.70–1.30)
EGFR IF NonAfrican American: >60 mL/min/{1.73_m2} (ref >60)
GFR African American: >60 mL/min/{1.73_m2} (ref >60)
Glucose: 95 mg/dL (ref 65–100)
Potassium: 3.7 mmol/L (ref 3.5–5.1)
Sodium: 139 mmol/L (ref 136–145)

## 2020-12-02 LAB — LIPID PANEL
CHOL/HDL Ratio: 2.1 (ref 0.0–5.0)
Chol/HDL Ratio: 2.1 (ref 0.0–5.0)
Cholesterol, Total: 92 MG/DL (ref <200)
Cholesterol, total: 92 MG/DL (ref <200)
HDL Cholesterol: 44 MG/DL
HDL: 44 MG/DL
LDL Calculated: 39.4 MG/DL (ref 0–100)
LDL, calculated: 39.4 MG/DL (ref 0–100)
Triglyceride: 43 MG/DL (ref <150)
Triglycerides: 43 MG/DL (ref <150)
VLDL Cholesterol Calculated: 8.6 MG/DL
VLDL, calculated: 8.6 MG/DL

## 2020-12-02 LAB — COMPREHENSIVE METABOLIC PANEL
ALT: 51 U/L (ref 12–78)
AST: 20 U/L (ref 15–37)
Albumin/Globulin Ratio: 1.2 (ref 1.1–2.2)
Albumin: 3.8 g/dL (ref 3.5–5.0)
Alkaline Phosphatase: 120 U/L — ABNORMAL HIGH (ref 45–117)
Anion Gap: 4 mmol/L — ABNORMAL LOW (ref 5–15)
BUN: 22 MG/DL — ABNORMAL HIGH (ref 6–20)
Bun/Cre Ratio: 35 — ABNORMAL HIGH (ref 12–20)
CO2: 25 mmol/L (ref 21–32)
Calcium: 9.1 MG/DL (ref 8.5–10.1)
Chloride: 111 mmol/L — ABNORMAL HIGH (ref 97–108)
Creatinine: 0.63 MG/DL — ABNORMAL LOW (ref 0.70–1.30)
EGFR IF NonAfrican American: >60 mL/min/{1.73_m2} (ref >60)
GFR African American: >60 mL/min/{1.73_m2} (ref >60)
Globulin: 3.2 g/dL (ref 2.0–4.0)
Glucose: 102 mg/dL — ABNORMAL HIGH (ref 65–100)
Potassium: 4.1 mmol/L (ref 3.5–5.1)
Sodium: 140 mmol/L (ref 136–145)
Total Bilirubin: 0.2 MG/DL (ref 0.2–1.0)
Total Protein: 7 g/dL (ref 6.4–8.2)

## 2020-12-02 LAB — CBC WITH AUTO DIFFERENTIAL
Basophils %: 0 % (ref 0–1)
Basophils Absolute: 0 10*3/uL (ref 0.0–0.1)
Eosinophils %: 2 % (ref 0–7)
Eosinophils Absolute: 0.1 10*3/uL (ref 0.0–0.4)
Granulocyte Absolute Count: 0 10*3/uL (ref 0.00–0.04)
Hematocrit: 37.9 % (ref 36.6–50.3)
Hemoglobin: 12.4 g/dL (ref 12.1–17.0)
Immature Granulocytes: 0 % (ref 0.0–0.5)
Lymphocytes %: 20 % (ref 12–49)
Lymphocytes Absolute: 1.3 10*3/uL (ref 0.8–3.5)
MCH: 25.4 PG — ABNORMAL LOW (ref 26.0–34.0)
MCHC: 32.7 g/dL (ref 30.0–36.5)
MCV: 77.7 FL — ABNORMAL LOW (ref 80.0–99.0)
MPV: 9 FL (ref 8.9–12.9)
Monocytes %: 13 % (ref 5–13)
Monocytes Absolute: 0.8 10*3/uL (ref 0.0–1.0)
NRBC Absolute: 0 10*3/uL (ref 0.00–0.01)
Neutrophils %: 65 % (ref 32–75)
Neutrophils Absolute: 4.1 10*3/uL (ref 1.8–8.0)
Nucleated RBCs: 0 PER 100 WBC
Platelets: 328 10*3/uL (ref 150–400)
RBC: 4.88 M/uL (ref 4.10–5.70)
RDW: 17.4 % — ABNORMAL HIGH (ref 11.5–14.5)
WBC: 6.4 10*3/uL (ref 4.1–11.1)

## 2020-12-02 LAB — APTT: aPTT: 27.1 s (ref 22.1–31.0)

## 2020-12-02 LAB — HEMOGLOBIN A1C W/EAG
Hemoglobin A1C: 5.8 % — ABNORMAL HIGH (ref 4.0–5.6)
eAG: 120 mg/dL

## 2020-12-02 LAB — POCT GLUCOSE: POC Glucose: 94 mg/dL (ref 65–117)

## 2020-12-02 LAB — PROTIME-INR
INR: 1 (ref 0.9–1.1)
Protime: 10.9 s (ref 9.0–11.1)

## 2020-12-02 LAB — TROPONIN, HIGH SENSITIVITY: Troponin, High Sensitivity: 8 ng/L (ref 0–76)

## 2020-12-02 LAB — CBC WITH AUTOMATED DIFF
ABS. BASOPHILS: 0 10*3/uL (ref 0.0–0.1)
ABS. EOSINOPHILS: 0.1 10*3/uL (ref 0.0–0.4)
ABS. IMM. GRANS.: 0 10*3/uL (ref 0.00–0.04)
ABS. LYMPHOCYTES: 1.3 10*3/uL (ref 0.8–3.5)
ABS. MONOCYTES: 0.8 10*3/uL (ref 0.0–1.0)
ABS. NEUTROPHILS: 4.1 10*3/uL (ref 1.8–8.0)
ABSOLUTE NRBC: 0 10*3/uL (ref 0.00–0.01)
BASOPHILS: 0 % (ref 0–1)
EOSINOPHILS: 2 % (ref 0–7)
HCT: 37.9 % (ref 36.6–50.3)
HGB: 12.4 g/dL (ref 12.1–17.0)
IMMATURE GRANULOCYTES: 0 % (ref 0.0–0.5)
LYMPHOCYTES: 20 % (ref 12–49)
MCH: 25.4 PG — ABNORMAL LOW (ref 26.0–34.0)
MCHC: 32.7 g/dL (ref 30.0–36.5)
MCV: 77.7 FL — ABNORMAL LOW (ref 80.0–99.0)
MONOCYTES: 13 % (ref 5–13)
MPV: 9 FL (ref 8.9–12.9)
NEUTROPHILS: 65 % (ref 32–75)
NRBC: 0 PER 100 WBC
PLATELET: 328 10*3/uL (ref 150–400)
RBC: 4.88 M/uL (ref 4.10–5.70)
RDW: 17.4 % — ABNORMAL HIGH (ref 11.5–14.5)
WBC: 6.4 10*3/uL (ref 4.1–11.1)

## 2020-12-02 LAB — PTT: aPTT: 27.1 s (ref 22.1–31.0)

## 2020-12-02 LAB — CBC W/O DIFF
ABSOLUTE NRBC: 0 10*3/uL (ref 0.00–0.01)
HCT: 36.9 % (ref 36.6–50.3)
HGB: 11.8 g/dL — ABNORMAL LOW (ref 12.1–17.0)
MCH: 25.1 PG — ABNORMAL LOW (ref 26.0–34.0)
MCHC: 32 g/dL (ref 30.0–36.5)
MCV: 78.3 FL — ABNORMAL LOW (ref 80.0–99.0)
MPV: 8.9 FL (ref 8.9–12.9)
NRBC: 0 PER 100 WBC
PLATELET: 299 10*3/uL (ref 150–400)
RBC: 4.71 M/uL (ref 4.10–5.70)
RDW: 17.4 % — ABNORMAL HIGH (ref 11.5–14.5)
WBC: 5.7 10*3/uL (ref 4.1–11.1)

## 2020-12-02 LAB — METABOLIC PANEL, COMPREHENSIVE
A-G Ratio: 1.2 (ref 1.1–2.2)
ALT (SGPT): 51 U/L (ref 12–78)
AST (SGOT): 20 U/L (ref 15–37)
Albumin: 3.8 g/dL (ref 3.5–5.0)
Alk. phosphatase: 120 U/L — ABNORMAL HIGH (ref 45–117)
Anion gap: 4 mmol/L — ABNORMAL LOW (ref 5–15)
BUN/Creatinine ratio: 35 — ABNORMAL HIGH (ref 12–20)
BUN: 22 MG/DL — ABNORMAL HIGH (ref 6–20)
Bilirubin, total: 0.2 MG/DL (ref 0.2–1.0)
CO2: 25 mmol/L (ref 21–32)
Calcium: 9.1 MG/DL (ref 8.5–10.1)
Chloride: 111 mmol/L — ABNORMAL HIGH (ref 97–108)
Creatinine: 0.63 MG/DL — ABNORMAL LOW (ref 0.70–1.30)
GFR est AA: >60 mL/min/{1.73_m2} (ref >60)
GFR est non-AA: >60 mL/min/{1.73_m2} (ref >60)
Globulin: 3.2 g/dL (ref 2.0–4.0)
Glucose: 102 mg/dL — ABNORMAL HIGH (ref 65–100)
Potassium: 4.1 mmol/L (ref 3.5–5.1)
Protein, total: 7 g/dL (ref 6.4–8.2)
Sodium: 140 mmol/L (ref 136–145)

## 2020-12-02 LAB — HEMOGLOBIN A1C WITH EAG
Est. average glucose: 120 mg/dL
Hemoglobin A1c: 5.8 % — ABNORMAL HIGH (ref 4.0–5.6)

## 2020-12-02 LAB — METABOLIC PANEL, BASIC
Anion gap: 4 mmol/L — ABNORMAL LOW (ref 5–15)
BUN/Creatinine ratio: 36 — ABNORMAL HIGH (ref 12–20)
BUN: 20 MG/DL (ref 6–20)
CO2: 25 mmol/L (ref 21–32)
Calcium: 8.5 MG/DL (ref 8.5–10.1)
Chloride: 110 mmol/L — ABNORMAL HIGH (ref 97–108)
Creatinine: 0.55 MG/DL — ABNORMAL LOW (ref 0.70–1.30)
GFR est AA: >60 mL/min/{1.73_m2} (ref >60)
GFR est non-AA: >60 mL/min/{1.73_m2} (ref >60)
Glucose: 95 mg/dL (ref 65–100)
Potassium: 3.7 mmol/L (ref 3.5–5.1)
Sodium: 139 mmol/L (ref 136–145)

## 2020-12-02 LAB — GLUCOSE, POC: Glucose (POC): 94 mg/dL (ref 65–117)

## 2020-12-02 LAB — TROPONIN-HIGH SENSITIVITY: Troponin-High Sensitivity: 8 ng/L (ref 0–76)

## 2020-12-02 LAB — PROTHROMBIN TIME + INR
INR: 1 (ref 0.9–1.1)
Prothrombin time: 10.9 s (ref 9.0–11.1)

## 2020-12-02 LAB — SAMPLES BEING HELD: SAMPLES BEING HELD: 1

## 2020-12-02 MED ORDER — ATORVASTATIN 40 MG TAB
40 mg | Freq: Every evening | ORAL | Status: DC
Start: 2020-12-02 — End: 2020-12-03
  Administered 2020-12-03: 01:00:00 via ORAL

## 2020-12-02 MED ORDER — SODIUM CHLORIDE 0.9 % IJ SYRG
Freq: Three times a day (TID) | INTRAMUSCULAR | Status: DC
Start: 2020-12-02 — End: 2020-12-03
  Administered 2020-12-02 – 2020-12-03 (×2): via INTRAVENOUS

## 2020-12-02 MED ORDER — SODIUM CHLORIDE 0.9 % IJ SYRG
Freq: Once | INTRAMUSCULAR | Status: AC
Start: 2020-12-02 — End: 2020-12-02
  Administered 2020-12-02: 19:00:00 via INTRAVENOUS

## 2020-12-02 MED ORDER — LACTATED RINGERS BOLUS IV
Freq: Once | INTRAVENOUS | Status: AC
Start: 2020-12-02 — End: 2020-12-02
  Administered 2020-12-02: 21:00:00 via INTRAVENOUS

## 2020-12-02 MED ORDER — TENECTEPLASE 50 MG IV KIT
50 mg | Freq: Once | INTRAVENOUS | Status: AC
Start: 2020-12-02 — End: 2020-12-02
  Administered 2020-12-02: 19:00:00 via INTRAVENOUS

## 2020-12-02 MED ORDER — IOPAMIDOL 76 % IV SOLN
76 % | Freq: Once | INTRAVENOUS | Status: AC
Start: 2020-12-02 — End: 2020-12-02
  Administered 2020-12-02: 20:00:00 via INTRAVENOUS

## 2020-12-02 MED ORDER — SODIUM CHLORIDE 0.9 % IJ SYRG
INTRAMUSCULAR | Status: AC | PRN
Start: 2020-12-02 — End: 2020-12-03

## 2020-12-02 MED ORDER — ACETAMINOPHEN 325 MG TABLET
325 mg | Freq: Four times a day (QID) | ORAL | Status: DC | PRN
Start: 2020-12-02 — End: 2020-12-02
  Administered 2020-12-02: 21:00:00 via ORAL

## 2020-12-02 MED ORDER — MAGNESIUM SULFATE 2 GRAM/50 ML IVPB
2 gram/50 mL (4 %) | Freq: Once | INTRAVENOUS | Status: AC
Start: 2020-12-02 — End: 2020-12-02
  Administered 2020-12-02: 21:00:00 via INTRAVENOUS

## 2020-12-02 MED ORDER — LACTATED RINGERS IV
INTRAVENOUS | Status: AC
Start: 2020-12-02 — End: 2020-12-03
  Administered 2020-12-02: 21:00:00 via INTRAVENOUS

## 2020-12-02 MED ORDER — IOPAMIDOL 76 % IV SOLN
370 mg iodine /mL (76 %) | Freq: Once | INTRAVENOUS | Status: AC
Start: 2020-12-02 — End: 2020-12-02
  Administered 2020-12-02: 20:00:00 via INTRAVENOUS

## 2020-12-02 MED ORDER — ONDANSETRON (PF) 4 MG/2 ML INJECTION
42 mg/2 mL | Freq: Four times a day (QID) | INTRAMUSCULAR | Status: DC | PRN
Start: 2020-12-02 — End: 2020-12-03

## 2020-12-02 MED FILL — MONOJECT PREFILL ADVANCED 0.9 % SODIUM CHLORIDE INJECTION SYRINGE: INTRAMUSCULAR | Qty: 10

## 2020-12-02 MED FILL — MONOJECT PREFILL ADVANCED 0.9 % SODIUM CHLORIDE INJECTION SYRINGE: INTRAMUSCULAR | Qty: 40

## 2020-12-02 MED FILL — LACTATED RINGERS IV: INTRAVENOUS | Qty: 1000

## 2020-12-02 MED FILL — ACETAMINOPHEN 325 MG TABLET: 325 mg | ORAL | Qty: 2

## 2020-12-02 MED FILL — ISOVUE-370  76 % INTRAVENOUS SOLUTION: 370 mg iodine /mL (76 %) | INTRAVENOUS | Qty: 100

## 2020-12-02 MED FILL — ISOVUE-370  76 % INTRAVENOUS SOLUTION: 370 mg iodine /mL (76 %) | INTRAVENOUS | Qty: 50

## 2020-12-02 MED FILL — MAGNESIUM SULFATE 2 GRAM/50 ML IVPB: 2 gram/50 mL (4 %) | INTRAVENOUS | Qty: 50

## 2020-12-02 NOTE — ED Notes (Signed)
Teleneurologist Dr. Simonne Come on screen at this time assessing patient at this time in CT.

## 2020-12-02 NOTE — ED Notes (Signed)
Dry CT finished at this time, awaiting tele neurologist to come on screen.

## 2020-12-02 NOTE — ED Provider Notes (Signed)
EMERGENCY DEPARTMENT HISTORY AND PHYSICAL EXAM      Date: 12/02/2020  Patient Name: Miguel Carr    History of Presenting Illness     HPI: Miguel Carr, 50 y.o. male with past medical history of prior CVA, complex hemiplegic migraines, presents to the ED with cc of stroke-like symptoms. He was a pre-alert level 1 code stroke arriving via EMS. Approximately 30 minutes prior to arrival (last known well around 2:30 PM), patient had abrupt onset left facial droop, dysarthria, and left-sided weakness and numbness.  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  He states that his current symptoms are much more severe than his usual hemiplegic migraine, and reports his dysarthric speech as well as his numbness is unusual in setting of complex migraine and feel more like his previous stroke. Patient is not on any blood thinners.     PCP: None    No current facility-administered medications on file prior to encounter.     Current Outpatient Medications on File Prior to Encounter   Medication Sig Dispense Refill    atorvastatin (LIPITOR) 10 mg tablet Take 10 mg by mouth daily.      lisinopriL (PRINIVIL, ZESTRIL) 20 mg tablet Take 20 mg by mouth daily.      aspirin 81 mg chewable tablet Take 81 mg by mouth daily.      nitroglycerin (NITROSTAT) 0.4 mg SL tablet Take 1 Tab by mouth every five (5) minutes as needed for Chest Pain. Sit/Lay down then put one tab under the tongue every 5 minutes as needed for chest pain for 3 doses 1 Bottle 0       Past History     Past Medical History:  Past Medical History:   Diagnosis Date    Hypertension     Myocardial infarct ([place]) 2013       Past Surgical History:  No past surgical history on file.    Family History:  No family history on file.    Social History:  Social History     Tobacco Use    Smoking status: Never   Substance Use Topics    Alcohol use: No    Drug use:  No     Comment: "used to use medical marijuana"       Allergies:  Allergies   Allergen Reactions    Reglan [Metoclopramide] Other (comments)     Muscle spasms    Toradol [Ketorolac Tromethamine] Rash    Tramadol Rash         Review of Systems   Review of Systems   Constitutional:  Negative for chills and fever.   HENT:  Negative for congestion and rhinorrhea.    Eyes:  Negative for visual disturbance.   Respiratory:  Negative for chest tightness and shortness of breath.    Cardiovascular:  Negative for chest pain and palpitations.   Gastrointestinal:  Negative for abdominal pain, diarrhea, nausea and vomiting.   Genitourinary:  Negative for dysuria, flank pain and hematuria.   Musculoskeletal:  Negative for back pain and neck pain.   Skin:  Negative for rash.   Allergic/Immunologic: Negative for immunocompromised state.   Neurological:  Positive for facial asymmetry, speech difficulty, weakness and numbness. Negative for dizziness and headaches.   Hematological:  Negative for adenopathy.   Psychiatric/Behavioral:  Negative for dysphoric mood and suicidal ideas.      Physical Exam  Physical Exam  Vitals and nursing note reviewed.   Constitutional:       General: He is not in acute distress.     Appearance: Normal appearance. He is not ill-appearing or toxic-appearing.   HENT:      Head: Normocephalic and atraumatic.      Nose: Nose normal.      Mouth/Throat:      Mouth: Mucous membranes are moist.   Eyes:      Extraocular Movements: Extraocular movements intact.      Pupils: Pupils are equal, round, and reactive to light.   Cardiovascular:      Rate and Rhythm: Normal rate and regular rhythm.      Pulses: Normal pulses.   Pulmonary:      Effort: Pulmonary effort is normal. No respiratory distress.      Breath sounds: Normal breath sounds. No stridor. No wheezing or rhonchi.   Abdominal:      General: Abdomen is flat. There is no distension.      Palpations: There is no mass.      Tenderness: There is no abdominal  tenderness.   Musculoskeletal:         General: Normal range of motion.      Cervical back: Normal range of motion and neck supple.      Right lower leg: No edema.      Left lower leg: No edema.   Skin:     General: Skin is warm and dry.   Neurological:      General: No focal deficit present.      Mental Status: He is alert. Mental status is at baseline.      Cranial Nerves: Dysarthria and facial asymmetry present.      Sensory: Sensory deficit present.      Motor: Weakness present.      Comments: Flaccid paralysis of the left upper and lower extremities       Diagnostic Study Results     Labs -     Recent Results (from the past 24 hour(s))   CBC WITH AUTOMATED DIFF    Collection Time: 12/02/20  2:59 PM   Result Value Ref Range    WBC 6.4 4.1 - 11.1 K/uL    RBC 4.88 4.10 - 5.70 M/uL    HGB 12.4 12.1 - 17.0 g/dL    HCT 37.9 36.6 - 50.3 %    MCV 77.7 (L) 80.0 - 99.0 FL    MCH 25.4 (L) 26.0 - 34.0 PG    MCHC 32.7 30.0 - 36.5 g/dL    RDW 17.4 (H) 11.5 - 14.5 %    PLATELET 328 150 - 400 K/uL    MPV 9.0 8.9 - 12.9 FL    NRBC 0.0 0 PER 100 WBC    ABSOLUTE NRBC 0.00 0.00 - 0.01 K/uL    NEUTROPHILS 65 32 - 75 %    LYMPHOCYTES 20 12 - 49 %    MONOCYTES 13 5 - 13 %    EOSINOPHILS 2 0 - 7 %    BASOPHILS 0 0 - 1 %    IMMATURE GRANULOCYTES 0 0.0 - 0.5 %    ABS. NEUTROPHILS 4.1 1.8 - 8.0 K/UL    ABS. LYMPHOCYTES 1.3 0.8 - 3.5 K/UL    ABS. MONOCYTES 0.8 0.0 - 1.0 K/UL    ABS. EOSINOPHILS 0.1 0.0 - 0.4 K/UL    ABS. BASOPHILS 0.0 0.0 - 0.1 K/UL    ABS. IMM. GRANS. 0.0  0.00 - 0.04 K/UL    DF AUTOMATED     METABOLIC PANEL, COMPREHENSIVE    Collection Time: 12/02/20  2:59 PM   Result Value Ref Range    Sodium 140 136 - 145 mmol/L    Potassium 4.1 3.5 - 5.1 mmol/L    Chloride 111 (H) 97 - 108 mmol/L    CO2 25 21 - 32 mmol/L    Anion gap 4 (L) 5 - 15 mmol/L    Glucose 102 (H) 65 - 100 mg/dL    BUN 22 (H) 6 - 20 MG/DL    Creatinine 0.63 (L) 0.70 - 1.30 MG/DL    BUN/Creatinine ratio 35 (H) 12 - 20      GFR est AA >60 >60 ml/min/1.75m     GFR est non-AA >60 >60 ml/min/1.792m   Calcium 9.1 8.5 - 10.1 MG/DL    Bilirubin, total 0.2 0.2 - 1.0 MG/DL    ALT (SGPT) 51 12 - 78 U/L    AST (SGOT) 20 15 - 37 U/L    Alk. phosphatase 120 (H) 45 - 117 U/L    Protein, total 7.0 6.4 - 8.2 g/dL    Albumin 3.8 3.5 - 5.0 g/dL    Globulin 3.2 2.0 - 4.0 g/dL    A-G Ratio 1.2 1.1 - 2.2     PROTHROMBIN TIME + INR    Collection Time: 12/02/20  2:59 PM   Result Value Ref Range    INR 1.0 0.9 - 1.1      Prothrombin time 10.9 9.0 - 11.1 sec   SAMPLES BEING HELD    Collection Time: 12/02/20  2:59 PM   Result Value Ref Range    SAMPLES BEING HELD 1 RED     COMMENT        Add-on orders for these samples will be processed based on acceptable specimen integrity and analyte stability, which may vary by analyte.   GLUCOSE, POC    Collection Time: 12/02/20  5:38 PM   Result Value Ref Range    Glucose (POC) 94 65 - 117 mg/dL    Performed by CaBonney Leitz  METABOLIC PANEL, BASIC    Collection Time: 12/02/20  5:44 PM   Result Value Ref Range    Sodium 139 136 - 145 mmol/L    Potassium 3.7 3.5 - 5.1 mmol/L    Chloride 110 (H) 97 - 108 mmol/L    CO2 25 21 - 32 mmol/L    Anion gap 4 (L) 5 - 15 mmol/L    Glucose 95 65 - 100 mg/dL    BUN 20 6 - 20 MG/DL    Creatinine 0.55 (L) 0.70 - 1.30 MG/DL    BUN/Creatinine ratio 36 (H) 12 - 20      GFR est AA >60 >60 ml/min/1.7346m  GFR est non-AA >60 >60 ml/min/1.46m20m Calcium 8.5 8.5 - 10.1 MG/DL   TROPONIN-HIGH SENSITIVITY    Collection Time: 12/02/20  5:44 PM   Result Value Ref Range    Troponin-High Sensitivity 8 0 - 76 ng/L   CBC W/O DIFF    Collection Time: 12/02/20  5:44 PM   Result Value Ref Range    WBC 5.7 4.1 - 11.1 K/uL    RBC 4.71 4.10 - 5.70 M/uL    HGB 11.8 (L) 12.1 - 17.0 g/dL    HCT 36.9 36.6 - 50.3 %    MCV 78.3 (L) 80.0 - 99.0 FL  MCH 25.1 (L) 26.0 - 34.0 PG    MCHC 32.0 30.0 - 36.5 g/dL    RDW 17.4 (H) 11.5 - 14.5 %    PLATELET 299 150 - 400 K/uL    MPV 8.9 8.9 - 12.9 FL    NRBC 0.0 0 PER 100 WBC    ABSOLUTE NRBC 0.00  0.00 - 0.01 K/uL   PTT    Collection Time: 12/02/20  5:44 PM   Result Value Ref Range    aPTT 27.1 22.1 - 31.0 sec    aPTT, therapeutic range     58.0 - 77.0 SECS   HEMOGLOBIN A1C WITH EAG    Collection Time: 12/02/20  5:44 PM   Result Value Ref Range    Hemoglobin A1c 5.8 (H) 4.0 - 5.6 %    Est. average glucose 120 mg/dL   LIPID PANEL    Collection Time: 12/02/20  5:46 PM   Result Value Ref Range    Cholesterol, total 92 <200 MG/DL    Triglyceride 43 <150 MG/DL    HDL Cholesterol 44 MG/DL    LDL, calculated 39.4 0 - 100 MG/DL    VLDL, calculated 8.6 MG/DL    CHOL/HDL Ratio 2.1 0.0 - 5.0     DRUG SCREEN, URINE    Collection Time: 12/02/20  9:02 PM   Result Value Ref Range    AMPHETAMINES Negative NEG      BARBITURATES Negative NEG      BENZODIAZEPINES Negative NEG      COCAINE Negative NEG      METHADONE Negative NEG      OPIATES Negative NEG      PCP(PHENCYCLIDINE) Negative NEG      THC (TH-CANNABINOL) Positive (A) NEG      Drug screen comment (NOTE)        Radiologic Studies -   CTA CODE NEURO HEAD AND NECK W CONT   Final Result   CTA Head:   1. No evidence of significant stenosis or aneurysm.      CTA Neck:   1. No evidence of significant stenosis.      CT Brain Perfusion:   1. No acute abnormality.         CT CODE NEURO PERF W CBF   Final Result   CTA Head:   1. No evidence of significant stenosis or aneurysm.      CTA Neck:   1. No evidence of significant stenosis.      CT Brain Perfusion:   1. No acute abnormality.         CT CODE NEURO HEAD WO CONTRAST   Final Result   1. No evidence of acute intracranial abnormality.         MRI BRAIN WO CONT    (Results Pending)   CT HEAD WO CONT    (Results Pending)     CT Results  (Last 48 hours)                 12/02/20 1552  CTA CODE NEURO HEAD AND NECK W CONT Final result    Impression:  CTA Head:   1. No evidence of significant stenosis or aneurysm.       CTA Neck:   1. No evidence of significant stenosis.       CT Brain Perfusion:   1. No acute abnormality.            Narrative:  EXAM:  CTA CODE NEURO HEAD AND NECK W CONT,  CT CODE NEURO PERF W CBF       INDICATION:   Code Stroke       COMPARISON:  CT head 12/02/2020.       CONTRAST:  120 mL of Isovue-370.       TECHNIQUE:  Unenhanced scout images were obtained to localize the volume for   acquisition.  Multislice helical axial CT angiography was performed from the   aortic arch to the top of the head during uneventful rapid bolus intravenous   contrast administration.   Coronal and sagittal reformations and 3D post   processing was performed.  CT dose reduction was achieved through use of a   standardized protocol tailored for this examination and automatic exposure   control for dose modulation.       CT brain perfusion was performed with generation of hemodynamic maps of multiple   parameters, including cerebral blood flow, cerebral blood volume, and MTT (mean   transit time). CT dose reduction was achieved through use of a standardized   protocol tailored for this examination and automatic exposure control for dose   modulation.       This study was analyzed by the [email or web address] algorithm.       FINDINGS:       CTA Head:   There is no evidence of large vessel occlusion or flow-limiting stenosis of the   intracranial internal carotid, anterior cerebral, and middle cerebral arteries.   The anterior communicating artery is patent, with small left A1 segment.        There is no evidence of large vessel occlusion or flow-limiting stenosis of the   intracranial vertebral arteries, basilar artery, or posterior cerebral arteries.   The posterior communicating arteries are patent.        There is no evidence of aneurysm or vascular malformation. The dural venous   sinuses and deep cerebral venous system are patent. No evidence of abnormal   enhancement on delayed phase images.       CTA NECK:   NASCET method was utilized for calculating stenosis.       Mild calcification of the aortic arch. The common carotid arteries demonstrate   no  significant stenosis. There is no evidence of significant stenosis in the   cervical right internal carotid artery. There is no evidence of significant   stenosis in the cervical left internal carotid artery.       There is a left dominant vertebrobasilar arterial system. The cervical vertebral   arteries are normal in course, size and contour without significant stenosis.        Visualized soft tissues of the neck are unremarkable. Visualized lung apices are   clear. No acute fracture or aggressive osseous lesion. 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 Multiple periapical   lucencies and dental caries of the maxillary and mandibular teeth.       CT Brain Perfusion:   There are no regional areas of elevated Tmax, decreased cerebral blood flow or   blood volume.    rCBF < 30% = 0 cc.    Tmax > 6 seconds = 0 cc.           12/02/20 1550  CT CODE NEURO PERF W CBF Final result    Impression:  CTA Head:   1. No evidence of significant stenosis or aneurysm.       CTA Neck:   1. No evidence of significant stenosis.  CT Brain Perfusion:   1. No acute abnormality.           Narrative:  EXAM:  CTA CODE NEURO HEAD AND NECK W CONT, CT CODE NEURO PERF W CBF       INDICATION:   Code Stroke       COMPARISON:  CT head 12/02/2020.       CONTRAST:  120 mL of Isovue-370.       TECHNIQUE:  Unenhanced scout images were obtained to localize the volume for   acquisition.  Multislice helical axial CT angiography was performed from the   aortic arch to the top of the head during uneventful rapid bolus intravenous   contrast administration.   Coronal and sagittal reformations and 3D post   processing was performed.  CT dose reduction was achieved through use of a   standardized protocol tailored for this examination and automatic exposure   control for dose modulation.       CT brain perfusion was performed with generation of hemodynamic maps of multiple   parameters, including cerebral blood  flow, cerebral blood volume, and MTT (mean   transit time). CT dose reduction was achieved through use of a standardized   protocol tailored for this examination and automatic exposure control for dose   modulation.       This study was analyzed by the [email or web address] algorithm.       FINDINGS:       CTA Head:   There is no evidence of large vessel occlusion or flow-limiting stenosis of the   intracranial internal carotid, anterior cerebral, and middle cerebral arteries.   The anterior communicating artery is patent, with small left A1 segment.        There is no evidence of large vessel occlusion or flow-limiting stenosis of the   intracranial vertebral arteries, basilar artery, or posterior cerebral arteries.   The posterior communicating arteries are patent.        There is no evidence of aneurysm or vascular malformation. The dural venous   sinuses and deep cerebral venous system are patent. No evidence of abnormal   enhancement on delayed phase images.       CTA NECK:   NASCET method was utilized for calculating stenosis.       Mild calcification of the aortic arch. The common carotid arteries demonstrate   no significant stenosis. There is no evidence of significant stenosis in the   cervical right internal carotid artery. There is no evidence of significant   stenosis in the cervical left internal carotid artery.       There is a left dominant vertebrobasilar arterial system. The cervical vertebral   arteries are normal in course, size and contour without significant stenosis.        Visualized soft tissues of the neck are unremarkable. Visualized lung apices are   clear. No acute fracture or aggressive osseous lesion. 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 Multiple periapical   lucencies and dental caries of the maxillary and mandibular teeth.       CT Brain Perfusion:   There are no regional areas of elevated Tmax, decreased cerebral blood flow or   blood volume.    rCBF  < 30% = 0 cc.    Tmax > 6 seconds = 0 cc.           12/02/20 1503  CT CODE NEURO HEAD WO CONTRAST Final result  Impression:  1. No evidence of acute intracranial abnormality.           Narrative:  EXAM:  CT CODE NEURO HEAD WO CONTRAST       INDICATION:   Code Stroke       COMPARISON: CT head 02/25/2019.       TECHNIQUE: Unenhanced CT of the head was performed using 5 mm images. Brain and   bone windows were generated.  CT dose reduction was achieved through use of a   standardized protocol tailored for this examination and automatic exposure   control for dose modulation.        FINDINGS:   The ventricles are normal in size and position. Basilar cisterns are patent. No   midline shift. There is no evidence of acute infarct, hemorrhage, or extraaxial   fluid collection.       The paranasal sinuses, mastoid air cells, and middle ears are clear. The orbital   contents are within normal limits.  There are no significant osseous or   extracranial soft tissue lesions.                 CXR Results  (Last 48 hours)      None            Medical Decision Making   I, Nena Polio, MD-- am the first provider for this patient, and I am the attending of record for this patient encounter.    I reviewed the vital signs, available nursing notes, past medical history, past surgical history, family history and social history.    Vital Signs-Reviewed the patient's vital signs.  Patient Vitals for the past 24 hrs:   Temp Pulse Resp BP SpO2   12/02/20 2300 -- 85 17 97/70 98 %   12/02/20 2245 -- 98 16 98/70 93 %   12/02/20 2230 -- 86 18 100/69 96 %   12/02/20 2215 -- 90 17 108/76 94 %   12/02/20 2200 -- 93 20 95/64 95 %   12/02/20 2145 -- 85 18 102/66 96 %   12/02/20 2130 -- 92 24 116/74 96 %   12/02/20 2115 -- 81 21 120/71 96 %   12/02/20 2100 -- 92 23 124/75 97 %   12/02/20 2045 -- 93 22 121/76 96 %   12/02/20 2030 -- 97 11 123/72 96 %   12/02/20 2015 -- 89 15 (!) 133/107 96 %   12/02/20 2000 98.3 ??F (36.8 ??C) 95 18 111/88 95 %    12/02/20 1915 -- 98 15 122/73 95 %   12/02/20 1900 98.7 ??F (37.1 ??C) 95 16 117/77 98 %   12/02/20 1845 -- 92 11 118/83 97 %   12/02/20 1830 98.9 ??F (37.2 ??C) 93 13 112/83 96 %   12/02/20 1825 98.9 ??F (37.2 ??C) 92 15 135/86 96 %   12/02/20 1800 98.7 ??F (37.1 ??C) 83 18 115/76 97 %   12/02/20 1745 -- 84 14 111/85 98 %   12/02/20 1730 -- 82 22 134/83 96 %   12/02/20 1715 -- 79 19 117/78 96 %   12/02/20 1700 -- 78 17 120/80 96 %   12/02/20 1645 -- 79 14 124/81 96 %   12/02/20 1630 -- 80 12 132/81 100 %   12/02/20 1615 -- 87 14 (!) 133/94 95 %   12/02/20 1600 -- 84 12 119/79 95 %   12/02/20 1545 98.4 ??F (36.9 ??C) 82 14 118/79 98 %   12/02/20 1530 --  91 18 (!) 141/75 98 %   12/02/20 1525 -- 85 18 118/73 97 %   12/02/20 1517 98.6 ??F (37 ??C) 96 17 126/86 96 %       Records Reviewed: Nursing Notes and Old Medical Records    Provider Notes (Medical Decision Making):   Ddx: acute cva, hemiplegic migraine, ICH, etc.    Patient was evaluated by myself immediately after arrival, with history and exam obtained while patient is being wheeled to [facility] by EMS.  Initial history and exam is concerning for acute CVA.  Teleneurology consulted for evaluation.    ED Course as of 12/02/20 2354   Sun Dec 02, 2020   1516 Patient evaluated by teleneurology- recommends TNK. Contraindications, risk vs benefits reviewed with patient by tele neurology, and pt gave verbal consent via discussion with teleneuro. TNK order placed. Patient will require admission to ICU for close monitoring after. [JM]   1739 Advised by ICU physician that on their chart review- they noticed that patient was seen at OSH on 9/13 for similar and also given TNK on that visit. Patient also now asking for narcotic pain medications for headache. Concern for possible malingering behavior.   [JM]      ED Course User Index  [JM] Nena Polio, MD     CRITICAL CARE NOTE:    Authorized and Performed by: Nena Polio, MD    IMPENDING DETERIORATION -CNS  ASSOCIATED RISK FACTORS -  Bleeding and CNS Decompensation  MANAGEMENT- Bedside Assessment  INTERPRETATION -  CT Scan, Blood Pressure, and Cardiac Output Measures   INTERVENTIONS - Neurologic interventions   CASE REVIEW - Hospitalist/Intensivist, Medical Sub-Specialist, and Nursing  TREATMENT RESPONSE -Stable  PERFORMED BY - Self    I have spent 50 minutes of critical care time involved in obtaining a history, examining the patient, lab review, arranging urgent treatment with development of a management plan, consultations with other providers, family decision- making, bedside attention and documentation. During this entire length of time I was immediately available to the patient .    Critical Care: The reason for providing this level of medical care for this critically ill patient was due to a critical illness that impaired one or more vital organ systems, such that there was a high probability of imminent or life threatening deterioration in the patient's condition. This care involved high complexity decision making to assess, manipulate, and support vital system functions, to treat this degree of vital organ system failure, and to prevent further life threatening deterioration of the patient???s condition.     Critical care time was exclusive of separately billable procedures.      ED Course:   Initial assessment performed. The patient's presenting problems have been discussed, and they are in agreement with the care plan formulated and outlined with them.  I have encouraged them to ask questions as they arise throughout their visit.    Nena Polio, MD      Disposition:  Admit    Diagnosis     Clinical Impression:   1. Acute CVA (cerebrovascular accident) [facility])        Attestations:    Nena Polio, MD    Please note that this dictation was completed with Dragon, the computer voice recognition software.  Quite often unanticipated grammatical, syntax, homophones, and other interpretive errors are inadvertently transcribed by the computer  software.  Please disregard these errors.  Please excuse any errors that have escaped final proofreading.  Thank you.

## 2020-12-02 NOTE — H&P (Signed)
H&P by Maia Petties, MD at 12/02/20 1634                Author: Maia Petties, MD  Service: Internal Medicine  Author Type: Physician       Filed: 12/09/20 0924  Date of Service: 12/02/20 1634  Status: Signed          Editor: Maia Petties, MD (Physician)                        [facility]      ICU TEAM Progress Note         Name:  Miguel Carr        DOB:  31-Dec-1970     MRN:  [id number]        Date:  12/02/2020                 ICU Assessment        1.  Complex migraine s/p TNK   2.  HA   3.  Shoulder pain                 ICU Comprehensive Plan of Care:        1.  Neurological System   Q 1 h neuro checks   Tylenol for now   Morphine would be acceptable for severe pain      2.  Cardiovascular System   BP <180/105   Transfusion Trigger (Hgb): <7 g/dL   Keep K > 4; Mg > 2       3.  Respiratory System   No acute resp issues      4.  Renal/GI/Endocrine System   IVFs: at 72 cc/hr   Ulcer Prophylaxis: Not at this time    Bowel Regimen: None needed at this time   Feeding:  Pending speech eval   Blood Sugar Goal 120-180 - Glycemic Control: Insulin      5.  Infectious Disease   No acute issues      6.  PT/OT:  Pending        7.  Goals of Care Discussion with family No       8.  Plan of Care/Code Status: Pending      9.  Discussed Care Plan with Bedside RN Yes      10.  Documentation of Current Medications Yes.  Not taking Lisinopril.        Subjective:     Progress Note: 12/02/2020        Reason for ICU Admission: Dizzy, difficulty moving left side, difficulty with speech      HPI:  Pt w h/o old stroke (per pt), HTN, hypercholesterol, said he started to have word finding difficulty, dizziness at 1410 today.  Says it is different from prior migraines.  He also endorses searing pain in head and shoulder making it difficult to  concentrate.           Active Problem List:     HTN   Hypercholesterol   RLS   Complex migraine           Past Medical History:         has a past medical history of  Hypertension and Myocardial infarct (HCC) (2013).        Past Surgical History:         has no past surgical history on file.        Home Medications:  Prior to Admission medications             Medication  Sig  Start Date  End Date  Taking?  Authorizing Provider            atorvastatin (LIPITOR) 10 mg tablet  Take 10 mg by mouth daily.        Provider, Historical     lisinopriL (PRINIVIL, ZESTRIL) 20 mg tablet  Take 20 mg by mouth daily.        Provider, Historical     aspirin 81 mg chewable tablet  Take 81 mg by mouth daily.        Other, Phys, MD            nitroglycerin (NITROSTAT) 0.4 mg SL tablet  Take 1 Tab by mouth every five (5) minutes as needed for Chest Pain. Sit/Lay down then put one tab under the tongue every 5 minutes as needed for  chest pain for 3 doses  06/06/14      Jule Ser, MD             Allergies/Social/Family History:          Allergies        Allergen  Reactions         ?  Reglan [Metoclopramide]  Other (comments)             Muscle spasms         ?  Toradol [Ketorolac Tromethamine]  Rash         ?  Tramadol  Rash           Social History          Tobacco Use         ?  Smoking status:  Never     ?  Smokeless tobacco:  Not on file       Substance Use Topics         ?  Alcohol use:  No         No family history on file.        Review of Systems:        Pt endorses pain on right side in shoulder, head, inability to move left        Objective:     Vital Signs:   Visit Vitals      BP  132/81     Pulse  80     Temp  98.4 ??F (36.9 ??C)     Resp  12     Ht  6\' 1"  (1.854 m)     Wt  74.4 kg (164 lb 0.4 oz)     SpO2  100%        BMI  21.64 kg/m??        O2 Device: None (Room air) Temp (24hrs), Avg:98.5 ??F (36.9 ??C), Min:98.4 ??F (36.9 ??C), Max:98.6 ??F (37 ??C)               Intake/Output:    No intake or output data in the 24 hours ending 12/02/20 1634      Physical Exam:   Thin male in some distress, dizzy, difficulty w word finding   CV RRR   Lungs Diminished BL   Abd soft, ND   Ext  thin         LABS AND  DATA: Personally reviewed     Recent Labs           12/02/20  1459     WBC  6.4     HGB  12.4     HCT  37.9        PLT  328          Recent Labs           12/02/20   1459     NA  140     K  4.1     CL  111*     CO2  25     BUN  22*     CREA  0.63*     GLU  102*        CA  9.1          Recent Labs           12/02/20   1459     AP  120*     TP  7.0     ALB  3.8        GLOB  3.2          Recent Labs           12/02/20   1459     INR  1.0        PTP  10.9         No results for input(s): PHI, PCO2I, PO2I, FIO2I in the last 72 hours.   No results for input(s): CPK, CKMB, TROIQ, BNPP in the last 72 hours.      DISPOSITION   ICU post TNK      CRITICAL CARE CONSULTANT NOTE   I had a face to face encounter with the patient, reviewed and interpreted patient data including clinical events, labs, images, vital signs, I/O's, and examined patient.  I have discussed the case and  the plan and management of the patient's care with the consulting services, the bedside nurses and the respiratory therapist.        NOTE OF PERSONAL INVOLVEMENT IN CARE    This patient has a high probability of imminent, clinically significant deterioration, which requires the highest level of preparedness to intervene urgently. I participated in the decision-making and  personally managed or directed the management of the following life and organ supporting interventions that required my frequent assessment to treat or prevent imminent deterioration.      I personally spent 35 minutes of critical care time.  This is time spent at this critically ill patient's bedside actively involved in patient care as well as the coordination of care.  This does  not include any procedural time which has been billed separately.      Maia Petties, MD   Staff Intensivist   [facility]   12/02/2020

## 2020-12-02 NOTE — ED Notes (Signed)
1513: Per Dr. Simonne Come, nurse to call pharmacy to start preparing TNK at this time. Pharmacy called.    1518: Pharmacy arrived and mixing TNK in CT at this time.     1525: TNK administered at this time per Dr. Simonne Come and Dr. Zoe Lan.     1526: Nurse continued in CT with patient per Dr. Simonne Come to get the remaining 2 CT scans.    1545: CT completed at this time.     1555: Patient in room with patient at this time.

## 2020-12-02 NOTE — ED Notes (Signed)
Arrived in CT with patient at this time. ER provider Dr. Elenore Rota and Eustace Pen, NP with neurocritical care at the bedside assessing patient at this time.

## 2020-12-02 NOTE — ED Notes (Signed)
Patient arrives via EMS, patient was waiting for a bus near [facility] and started to have left sided weakness, numbness, stuttering words and left facial droop. Last known well was 1430. Patient has a history of previous stroke back in 2019, but patient states he got back to normal after that. Takes aspirin 81 mg. BS with EMS was 130, BP was 117/66.

## 2020-12-02 NOTE — ED Notes (Signed)
TRANSFER - OUT REPORT:    Verbal report given to Charlie, RN(name) on Miguel Carr  being transferred to 7115(unit) for routine progression of care       Report consisted of patient's Situation, Background, Assessment and   Recommendations(SBAR).     Information from the following report(s) SBAR, ED Summary, MAR, and Recent Results was reviewed with the receiving nurse.    Lines:   Peripheral IV 12/02/20 Left Antecubital (Active)   Site Assessment Clean, dry, & intact 12/02/20 1518   Phlebitis Assessment 0 12/02/20 1518   Infiltration Assessment 0 12/02/20 1518   Dressing Status Clean, dry, & intact 12/02/20 1518   Dressing Type Transparent 12/02/20 1518   Hub Color/Line Status Green 12/02/20 1518       Peripheral IV 12/02/20 Right Forearm (Active)   Site Assessment Clean, dry, & intact 12/02/20 1515   Phlebitis Assessment 0 12/02/20 1515   Infiltration Assessment 0 12/02/20 1515   Dressing Status Clean, dry, & intact 12/02/20 1515   Dressing Type Transparent 12/02/20 1515   Hub Color/Line Status Pink 12/02/20 1515        Opportunity for questions and clarification was provided.      Patient transported with:   Registered Nurse

## 2020-12-02 NOTE — Progress Notes (Signed)
 TRANSFER - IN REPORT:    Verbal report received from thaynara(name) on Miguel Carr  being received from er(unit) for routine progression of care      Report consisted of patient's Situation, Background, Assessment and   Recommendations(SBAR).     Information from the following report(s) SBAR, Kardex, and MAR was reviewed with the receiving nurse.    Opportunity for questions and clarification was provided.      Assessment completed upon patient's arrival to unit and care assumed.

## 2020-12-02 NOTE — ED Notes (Signed)
Patient states he does not need to void at this time for urine sample. States will notify nurse when gets the urge.
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
## 2020-12-02 NOTE — Progress Notes (Signed)
Progress Notes by Guadlupe Spanish, NP at 12/02/20 1620                Author: Guadlupe Spanish, NP  Service: Neurology  Author Type: Nurse Practitioner       Filed: 12/02/20 1740  Date of Service: 12/02/20 1620  Status: Addendum          Editor: Guadlupe Spanish, NP (Nurse Practitioner)          Related Notes: Original Note by Guadlupe Spanish, NP (Nurse Practitioner) filed at 12/02/20  1734                    Neurocritical Care Code Stroke Documentation           Symptoms:    Left-sided weakness, left-sided numbness, blurry vision         Baseline mRS  0     Last Known Well:  Around 1410 pm today per patient         Medical hx:     Past Medical History:      Diagnosis  Date      ?  Hypertension        ?  Myocardial infarct (HCC)   High cholesterol   Remote CVA in 2019 with similar symptoms of left-sided weakness and left-sided numbness/tingling, but patient reports he fully recovered with no residual deficits   Hx of complex migraines with similar presentation of left sided weakness and left-sided tingling   Intermittent explosive disorder   2013                 Anticoagulation:   On 81 mg of aspirin daily      VAN:    Positive     NIHSS:    1a-LOC:0     1b-Month/Age:50     1c-Open/Close Hand:0     2-Best Gaze:0     3-Visual Fields:2     4-Facial Palsy:1     5a-Left Arm:2     5b-Right Arm:0     6a-Left Leg:2     6b-Right Leg:0     7-Limb Ataxia:0     8-Sensory:2     9-Best Language:0 (slow with speech and stutters)     10-Dysarthria:0     11-Extinction/Inattention:2   TOTAL SCORE:11        Imaging:    CT: IMPRESSION   1. No evidence of acute intracranial abnormality.     CTA/CTP: IMPRESSION   CTA Head:   1. No evidence of significant stenosis or aneurysm.       CTA Neck:   1. No evidence of significant stenosis.       CT Brain Perfusion:   1. No acute abnormality.             Plan:    TNK Candidate: YES     Mechanical thrombectomy Candidate: NO     Alerted Dr. Tami Ribas on call with [facility] that patient is VAN  positive. CTA/CTP negative.       Discussed with Dr. Zoe Lan.      Arrival time: 1455 pm    Time spent: 70 minutes.       Guadlupe Spanish, NP

## 2020-12-02 NOTE — Progress Notes (Signed)
Bedside shift change report given to Romeo Apple RN (oncoming nurse) by Demetria Pore (offgoing nurse). Report included the following information SBAR, Kardex, ED Summary, MAR, Cardiac Rhythm NSR, and Dual Neuro Assessment.     2000: On initial assessment of patient, patient states he cannot move and barely feels his entire L side of his body and is unable to move L side when asked to for neuro exam. Patient asked for milk so I left the room to go get some. On returning to the room patient's L arm is scratching his head and L leg is sitting up at a 90 degree angle. Purposeful and spontaneous movement documented on assessment.     0400: When RN entered the room to obtain 4am vital signs and labs patient stated he wished to leave AMA. RN educated patient on the risks of leaving early due to the TNK infusion earlier today. Patient still wished to leave AMA. Desiree NP informed and AMA form filled out with patient at 04:07.

## 2020-12-03 ENCOUNTER — Inpatient Hospital Stay: Payer: Medicaid - Out of State | Attending: Acute Care

## 2020-12-03 LAB — EKG 12-LEAD
Atrial Rate: 86 {beats}/min
Diagnosis: NORMAL
P Axis: 18 degrees
P-R Interval: 114 ms
Q-T Interval: 388 ms
QRS Duration: 92 ms
QTc Calculation (Bazett): 464 ms
R Axis: 67 degrees
T Axis: 45 degrees
Ventricular Rate: 86 {beats}/min

## 2020-12-03 LAB — DRUG SCREEN, URINE
AMPHETAMINES: NEGATIVE
Amphetamine Screen, Urine: NEGATIVE
BARBITURATES: NEGATIVE
BENZODIAZEPINES: NEGATIVE
Barbiturate Screen, Urine: NEGATIVE
Benzodiazepine Screen, Urine: NEGATIVE
COCAINE: NEGATIVE
Cocaine Screen Urine: NEGATIVE
METHADONE: NEGATIVE
Methadone Screen, Urine: NEGATIVE
OPIATES: NEGATIVE
Opiate Screen, Urine: NEGATIVE
PCP Screen, Urine: NEGATIVE
PCP(PHENCYCLIDINE): NEGATIVE
THC (TH-CANNABINOL): POSITIVE — AB
THC Screen, Urine: POSITIVE — AB

## 2020-12-03 LAB — EKG, 12 LEAD, INITIAL
Atrial Rate: 86 {beats}/min
Calculated P Axis: 18 degrees
Calculated R Axis: 67 degrees
Calculated T Axis: 45 degrees
Diagnosis: NORMAL
P-R Interval: 114 ms
Q-T Interval: 388 ms
QRS Duration: 92 ms
QTC Calculation (Bezet): 464 ms
Ventricular Rate: 86 {beats}/min

## 2020-12-03 MED ORDER — METHYL SALICYLATE-MENTHOL 15 %-10 % TOPICAL CREAM
15-10 % | Freq: Three times a day (TID) | CUTANEOUS | Status: AC
Start: 2020-12-03 — End: 2020-12-03
  Administered 2020-12-03: 06:00:00 via TOPICAL

## 2020-12-03 MED ORDER — ACETAMINOPHEN 500 MG TAB
500 mg | Freq: Four times a day (QID) | ORAL | Status: AC
Start: 2020-12-03 — End: 2020-12-03
  Administered 2020-12-03 (×2): via ORAL

## 2020-12-03 MED ORDER — IBUPROFEN 400 MG TAB
400 mg | Freq: Four times a day (QID) | ORAL | Status: AC | PRN
Start: 2020-12-03 — End: 2020-12-03

## 2020-12-03 MED FILL — ATORVASTATIN 40 MG TAB: 40 mg | ORAL | Qty: 2

## 2020-12-03 MED FILL — MUSCLE RUB 15 %-10 % TOPICAL CREAM: 15-10 % | CUTANEOUS | Qty: 85

## 2020-12-03 MED FILL — ACETAMINOPHEN 500 MG TAB: 500 mg | ORAL | Qty: 2

## 2020-12-03 NOTE — Progress Notes (Signed)
 Physician Progress Note      PATIENTCORRAN, Miguel Carr  CSN #:                  [id number]  DOB:                       1970/10/09  ADMIT DATE:       12/02/2020 2:55 PM  DISCH DATE:        12/03/2020 4:07 AM  RESPONDING  PROVIDER #:        LAMARR FORBES BALLING NP          QUERY TEXT:    Pt admitted with left sided weakness, numbness, stuttering words and left facial droop. Pt noted to have Suspected Acute Ischemic CVA vs. Complex Migraine. If possible, please document in progress notes and discharge summary the cause of the patient's left sided weakness, numbness, stuttering words and left facial droop    The medical record reflects the following:  Risk Factors: 50yo with hx of left sided weakness, HTN, high cholesterol, smoker, and remote CVA in 2019    Clinical Indicators:  Neuro consult  CT of Head showed no acute process. He was deemed a candidate for TNK so it was administered. CTA of the head and neck and CT Perfusion was negative for any acute abnormality or LVO.    He complains of a severe right-sided headache, rated 10/10 currently, described as some one drilling in his head. He endorses left-sided weakness, left-sided numbness, speech difficulty, balance, and coordination issues. He denies any bowel or bladder issues.    Treatment: Neuro following, TNK, CTA/CT head, MRI not done because pt needs sedation    Thank you,  Powell Clover  [phone]  I am also available via [employer].  Options provided:  -- Acute Ischemic CVA  -- Complex Migraine  -- Other - I will add my own diagnosis  -- Disagree - Not applicable / Not valid  -- Disagree - Clinically unable to determine / Unknown  -- Refer to Clinical Documentation Reviewer    PROVIDER RESPONSE TEXT:    This patient has left sided weakness, numbness, stuttering words and left facial droop due to Complex Migraine. The patient went AMA from the ICU.    Query created by: Clover Powell on 12/07/2020 9:43 AM      Electronically signed by:   KATHRYN E FUNK NP 12/08/2020 7:14 AM

## 2020-12-03 NOTE — Progress Notes (Signed)
Progress Notes by Lyndle Herrlich, NP at 12/03/20 0056                Author: Lyndle Herrlich, NP  Service: Neurology  Author Type: Nurse Practitioner       Filed: 12/03/20 0125  Date of Service: 12/03/20 0056  Status: Attested           Editor: Lyndle Herrlich, NP (Nurse Practitioner)  Cosigner: Lossie Faes K, DO at 12/03/20 1610          Attestation signed by Shelia Media, DO at 12/03/20 [phone] (Updated)          Neurology staff:      I discussed this case with NP Binford and NP Alfred Levins.  This patient left AMA early this morning around 4:30 AM before he could be seen by me.      [id number] Barnett Applebaum, DO   Neurologist   Diplomate, American Board of Psychiatry and Neurology   Board Certified, Adult Neurology and Brain Injury Medicine                                         Neurology Progress Note   Lyndle Herrlich, NP      Admit Date: 12/02/2020    LOS: 1 day        Daily Progress Note: 12/03/2020      S/P: TNK on 9/18 at 15:25      HPI: Miguel Carr is a 50 y.o. 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 A code Stroke was called and CT brain showed no acute abnormalities. Pt was  a candidate for thrombolytics and TNK was given in our ED. CTA H/N showed no LVO and there was no perfusion mismatch. Of note, he was recently admitted 11/27/2020 at [facility] for slurred speech and left-sided weakness s/p TNK. Patient refused MRI during  this admission and follow up head CT showed no abnormalities per review of notes. 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 Per chart review, pt has been seen in the ED multiple times for similar S/S.        Subjective:        Pt seen in the ICU. He was sleeping on his side and did awaken to voice. He was reluctant to cooperate with exam but did participate in most of the exam.  C/O HA rated 7/10 but stated he knew he could have no narcotics. States the full exam will "trigger"  his IED. Pt states he can only have an MRI if we "knock him out" and would like to get some sleep. He does endorse left sided weakness and numbness on left side--however states he has sensitive skin and ongoing sensitivity to touch on his BLE. His UDS  is positive for THC.      Denies chest pain, nausea, vomiting, difficulty swallowing, and dyspnea.      Plan:        Suspected Acute Ischemic CVA vs. Complex Migraine    -  s/p TNK today at 1525   - CTA/CTP negative for any acute abnormality   - admit to ICU post TNK with neuro checks post TNK protocol   - MRI of Brain to assess for ischemia (patient reports he needs to be heavily sedated for MRI)   - obtain CT of Head at 24 hours post TNK to assess for hemorrhage, if negative for hemorrhage start antiplatelet regimen for stroke prevention   - Lipitor 80 mg ordered   - check lipid panel and Hgb A1C   - UDS + for [facility]   - check ECHO to assess for any heart abnormality   - LR @75  ml/hr   - Neurochecks per post-thrombolytic protocol and then q4 if pt remains stable at 24 hours   - PT/OT/SLP evals      Headaches   - Tylenol 1g q6 hours ordered   - Motrin 800 mg prn q 6 hours   - Avoid narcotics       Review of Systems:   A comprehensive review of systems was negative except for that written in the History of Present Illness.        Past Medical History:        Diagnosis  Date         ?  Hypertension           ?  Myocardial infarct [facility])  2013        No family history on file.     Social History          Tobacco Use         ?  Smoking status:  Never     ?  Smokeless tobacco:  Not on file        Substance Use Topics         ?  Alcohol use:  No           Prior to Admission Medications     Prescriptions  Last Dose  Informant  Patient Reported?  Taking?      aspirin 81 mg chewable tablet    Self  Yes  No      Sig: Take 81 mg by mouth daily.      atorvastatin (LIPITOR) 10 mg tablet    Self  Yes  No      Sig: Take 10 mg by mouth daily.      lisinopriL (PRINIVIL, ZESTRIL) 20 mg tablet    Self  Yes  No      Sig: Take 20 mg by mouth daily.      nitroglycerin (NITROSTAT) 0.4 mg SL tablet    Self  No  No      Sig: Take 1 Tab by mouth every five (5) minutes as needed for Chest Pain. Sit/Lay down then put one tab under the tongue every 5 minutes as needed for  chest pain for 3 doses               Facility-Administered Medications: None            Allergies        Allergen  Reactions         ?  Reglan [Metoclopramide]  Other (comments)             Muscle spasms         ?  Toradol [Ketorolac Tromethamine]  Rash         ?  Tramadol  Rash  Objective:     Vital signs   Temp (24hrs), Avg:98.6 F (37 C), Min:98.3 F (36.8 C), Max:98.9 F (37.2 C)    09/18 1901 - 09/19 0700   In: 75 [I.V.:75]   Out: -   09/17 0701 - 09/18 1900   In: 685 [I.V.:685]   Out: -    Visit Vitals      BP  109/67     Pulse  78     Temp  98.3 F (36.8 C)     Resp  18     Ht  6\' 1"  (1.854 m)     Wt  72.8 kg (160 lb 8 oz)     SpO2  95%        BMI  21.18 kg/m        O2 Device: None (Room air)      Vitals:             12/02/20 2345  12/03/20 0000  12/03/20 0015  12/03/20 0030           BP:             Pulse:  82  82  80  78     Resp:  16  20  21  18      Temp:             SpO2:  96%  96%  97%  95%     Weight:                   Height:                    Physical Exam:   GENERAL: sleepy, irritable, will cooperate with prompting   SKIN: Warm, dry, color appropriate for ethnicity.    Neurologic Exam:   Mental Status:  Oriented x 4.  Appropriate affect, mood and behavior.         Language:    Stuttering speech, but WNL repetition, comprehension and  naming.      Cranial Nerves:   Pupils 3 mm, equal, round and reactive to light.      Visual fields full to confrontation.      Extraocular movements intact.         Facial sensation diminished on left side      Full facial strength, no asymmetry.       Hearing grossly intact bilaterally.            Motor:    Drift LUE and LLE, no drift RUE, RLE      Bulk and tone normal.       5/5 power in Right side, 3/5 power in left grip and leg but pt not participating      No involuntary movements.      Sensation:    Sensation diminished to left side          Coordination & Gait: Gait deferred, FTN and HTS not obtained 2/2 pt demeanor and "triggering" of IED.         Labs:     Lab Results         Component  Value  Date/Time            WBC  5.7  12/02/2020 05:44 PM       HGB  11.8 (L)  12/02/2020 05:44 PM       HCT  36.9  12/02/2020 05:44 PM       PLATELET  299  12/02/2020 05:44 PM            MCV  78.3 (L)  12/02/2020 05:44 PM          Lab Results         Component  Value  Date/Time            Sodium  139  12/02/2020 05:44 PM       Potassium  3.7  12/02/2020 05:44 PM       Chloride  110 (H)  12/02/2020 05:44 PM       CO2  25  12/02/2020 05:44 PM       Anion gap  4 (L)  12/02/2020 05:44 PM       Glucose  95  12/02/2020 05:44 PM       BUN  20  12/02/2020 05:44 PM       Creatinine  0.55 (L)  12/02/2020 05:44 PM       BUN/Creatinine ratio  36 (H)  12/02/2020 05:44 PM       GFR est AA  >60  12/02/2020 05:44 PM       GFR est non-AA  >60  12/02/2020 05:44 PM            Calcium  8.5  12/02/2020 05:44 PM        Imaging:      Results from Hospital Encounter encounter on 12/02/20      CT CODE NEURO PERF W CBF      CTA CODE NEURO HEAD AND NECK W CONT      Narrative   EXAM:  CTA CODE NEURO HEAD AND NECK W CONT, CT CODE NEURO PERF W CBF      INDICATION:   Code Stroke      COMPARISON:  CT head 12/02/2020.      FINDINGS:      CTA Head:   There is no evidence of large vessel occlusion or flow-limiting stenosis of the   intracranial internal  carotid, anterior cerebral, and middle cerebral arteries.   The anterior communicating artery is patent, with small left A1 segment.      There is no evidence of large vessel occlusion or flow-limiting stenosis of the   intracranial vertebral arteries, basilar artery, or posterior cerebral arteries.   The posterior communicating arteries are patent.      There is no evidence of aneurysm or vascular malformation. The dural venous   sinuses and deep cerebral venous system are patent. No evidence of abnormal   enhancement on delayed phase images.      CTA NECK:   NASCET method was utilized for calculating stenosis.      Mild calcification of the aortic arch. The common carotid arteries demonstrate   no significant stenosis. There is no evidence of significant stenosis in the   cervical right internal carotid artery. There is no evidence of significant   stenosis in the cervical left internal carotid artery.      There is a left dominant vertebrobasilar arterial system. The cervical vertebral   arteries are normal in course, size and contour without significant stenosis.      Visualized soft tissues of the neck are unremarkable. Visualized lung apices are   clear. No acute fracture or aggressive osseous lesion. 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 Multiple periapical   lucencies and dental caries of the maxillary and mandibular teeth.      CT  Brain Perfusion:   There are no regional areas of elevated Tmax, decreased cerebral blood flow or   blood volume.   rCBF < 30% = 0 cc.   Tmax > 6 seconds = 0 cc.      Impression   CTA Head:   1. No evidence of significant stenosis or aneurysm.      CTA Neck:   1. No evidence of significant stenosis.      CT Brain Perfusion:   1. No acute abnormality.         Assessment:     Active Problems:     Stroke [facility]) (12/02/2020)      Further recommendations to follow from Dr. Orvis Brill.      Lyndle Herrlich, NP   Neurocritical Care Nurse  Practitioner

## 2020-12-03 NOTE — Progress Notes (Signed)
Progress Notes by Knute Neu, ACNP at 12/03/20 [phone]                Author: Knute Neu, ACNP  Service: Internal Medicine  Author Type: Acute Care Nurse Practitioner       Filed: 12/03/20 0417  Date of Service: 12/03/20 0412  Status: Signed          Editor: Knute Neu, ACNP (Acute Care Nurse Practitioner)                               CRITICAL CARE NOTE       Notified by nursing that patient is requesting to leave AMA. I spoke with the patient at bedside who reports, " I am moving much better (demonstrates moving left arm/leg 5/5 strength)  and when I feel better I don't stay in the hospital if I don't need to". Risks and benefits discussed with patient and he verbalizes understanding and repeats risks back in logical format with clear speech.    AMA form signed, IV's x2 Dc'd from patient by bedside RN, patient visualized getting OOB independently with stable footing, dressing independently on 2 feet with vitals stable before leaving.      Jac Canavan, AGACNP-BC

## 2020-12-04 ENCOUNTER — Emergency Department: Admit: 2020-12-04 | Payer: Medicaid - Out of State

## 2020-12-04 ENCOUNTER — Inpatient Hospital Stay
Admit: 2020-12-04 | Discharge: 2020-12-04 | Disposition: A | Discharge status: Home / Self Care | Payer: Medicaid - Out of State | Attending: Student in an Organized Health Care Education/Training Program

## 2020-12-04 ENCOUNTER — Inpatient Hospital Stay
Admit: 2020-12-04 | Discharge: 2020-12-05 | Disposition: A | Discharge status: Home / Self Care | Attending: Emergency Medicine

## 2020-12-04 DIAGNOSIS — R1031 Right lower quadrant pain: Secondary | ICD-10-CM

## 2020-12-04 DIAGNOSIS — R079 Chest pain, unspecified: Secondary | ICD-10-CM

## 2020-12-04 LAB — COMPREHENSIVE METABOLIC PANEL
ALT: 35 U/L (ref 10–50)
ALT: 39 U/L (ref 12–78)
AST: 31 U/L (ref 10–50)
AST: 19 U/L (ref 15–37)
Albumin/Globulin Ratio: 1.8 (ref 1.1–2.2)
Albumin/Globulin Ratio: 1.1 (ref 1.1–2.2)
Albumin: 4.6 g/dL (ref 3.5–5.2)
Albumin: 3.5 g/dL (ref 3.5–5.0)
Alkaline Phosphatase: 130 U/L — ABNORMAL HIGH (ref 40–129)
Alkaline Phosphatase: 119 U/L — ABNORMAL HIGH (ref 45–117)
Anion Gap: 10 mmol/L (ref 5–15)
Anion Gap: 5 mmol/L (ref 5–15)
BUN: 16 MG/DL (ref 6–20)
BUN: 15 mg/dL (ref 6–20)
Bun/Cre Ratio: 30 — ABNORMAL HIGH (ref 12–20)
Bun/Cre Ratio: 22 — ABNORMAL HIGH (ref 12–20)
CO2: 26 mmol/L (ref 22–29)
CO2: 25 mmol/L (ref 21–32)
Calcium: 9.5 MG/DL (ref 8.6–10.0)
Calcium: 8.3 mg/dL — ABNORMAL LOW (ref 8.5–10.1)
Chloride: 105 mmol/L (ref 98–107)
Chloride: 110 mmol/L — ABNORMAL HIGH (ref 97–108)
Creatinine: 0.54 MG/DL — ABNORMAL LOW (ref 0.70–1.20)
Creatinine: 0.68 mg/dL — ABNORMAL LOW (ref 0.70–1.30)
EGFR IF NonAfrican American: >60 mL/min/{1.73_m2} (ref >60)
EGFR IF NonAfrican American: >60 mL/min/{1.73_m2} (ref >60)
GFR African American: >60 mL/min/{1.73_m2} (ref >60)
GFR African American: >60 mL/min/{1.73_m2} (ref >60)
Globulin: 2.5 g/dL (ref 2.0–4.0)
Globulin: 3.1 g/dL (ref 2.0–4.0)
Glucose: 100 mg/dL (ref 65–100)
Glucose: 101 mg/dL — ABNORMAL HIGH (ref 65–100)
Potassium: 4.9 mmol/L (ref 3.5–5.1)
Potassium: 4.3 mmol/L (ref 3.5–5.1)
Sodium: 141 mmol/L (ref 136–145)
Sodium: 140 mmol/L (ref 136–145)
Total Bilirubin: 0.3 MG/DL (ref 0.2–1.0)
Total Bilirubin: 0.1 mg/dL — ABNORMAL LOW (ref 0.2–1.0)
Total Protein: 7.1 g/dL (ref 6.4–8.3)
Total Protein: 6.6 g/dL (ref 6.4–8.2)

## 2020-12-04 LAB — CBC WITH AUTO DIFFERENTIAL
Basophils %: 0 % (ref 0–1)
Basophils %: 0 % (ref 0–1)
Basophils Absolute: 0 10*3/uL (ref 0.0–0.1)
Basophils Absolute: 0 10*3/uL (ref 0.0–0.1)
Eosinophils %: 2 % (ref 0–7)
Eosinophils %: 4 % (ref 0–7)
Eosinophils Absolute: 0.1 10*3/uL (ref 0.0–0.4)
Eosinophils Absolute: 0.2 10*3/uL (ref 0.0–0.4)
Granulocyte Absolute Count: 0 10*3/uL (ref 0.00–0.04)
Granulocyte Absolute Count: 0 10*3/uL (ref 0.00–0.04)
Hematocrit: 39.5 % (ref 36.6–50.3)
Hematocrit: 36.2 % — ABNORMAL LOW (ref 36.6–50.3)
Hemoglobin: 13.1 g/dL (ref 12.1–17.0)
Hemoglobin: 11.7 g/dL — ABNORMAL LOW (ref 12.1–17.0)
Immature Granulocytes: 0 % (ref 0.0–0.5)
Immature Granulocytes: 0 % (ref 0–0.5)
Lymphocytes %: 23 % (ref 12–49)
Lymphocytes %: 23 % (ref 12–49)
Lymphocytes Absolute: 1.4 10*3/uL (ref 0.8–3.5)
Lymphocytes Absolute: 1.3 10*3/uL (ref 0.8–3.5)
MCH: 25.1 PG — ABNORMAL LOW (ref 26.0–34.0)
MCH: 25.2 PG — ABNORMAL LOW (ref 26.0–34.0)
MCHC: 33.2 g/dL (ref 30.0–36.5)
MCHC: 32.3 g/dL (ref 30.0–36.5)
MCV: 75.8 FL — ABNORMAL LOW (ref 80.0–99.0)
MCV: 77.8 FL — ABNORMAL LOW (ref 80.0–99.0)
MPV: 8.9 FL (ref 8.9–12.9)
MPV: 9 FL (ref 8.9–12.9)
Monocytes %: 14 % — ABNORMAL HIGH (ref 5–13)
Monocytes %: 14 % — ABNORMAL HIGH (ref 5–13)
Monocytes Absolute: 0.8 10*3/uL (ref 0.0–1.0)
Monocytes Absolute: 0.8 10*3/uL (ref 0.0–1.0)
NRBC Absolute: 0 10*3/uL (ref 0.00–0.01)
NRBC Absolute: 0 10*3/uL (ref 0.00–0.01)
Neutrophils %: 61 % (ref 32–75)
Neutrophils %: 59 % (ref 32–75)
Neutrophils Absolute: 3.7 10*3/uL (ref 1.8–8.0)
Neutrophils Absolute: 3.3 10*3/uL (ref 1.8–8.0)
Nucleated RBCs: 0 PER 100 WBC
Nucleated RBCs: 0 PER 100 WBC
Platelets: 313 10*3/uL (ref 150–400)
Platelets: 325 10*3/uL (ref 150–400)
RBC: 5.21 M/uL (ref 4.10–5.70)
RBC: 4.65 M/uL (ref 4.10–5.70)
RDW: 18.4 % — ABNORMAL HIGH (ref 11.5–14.5)
RDW: 17.7 % — ABNORMAL HIGH (ref 11.5–14.5)
WBC: 6.1 10*3/uL (ref 4.1–11.1)
WBC: 5.6 10*3/uL (ref 4.1–11.1)

## 2020-12-04 LAB — EKG 12-LEAD
Atrial Rate: 96 {beats}/min
P Axis: 3 degrees
P-R Interval: 114 ms
Q-T Interval: 352 ms
QRS Duration: 90 ms
QTc Calculation (Bazett): 444 ms
R Axis: 18 degrees
T Axis: -3 degrees
Ventricular Rate: 96 {beats}/min

## 2020-12-04 LAB — LIPASE
Lipase: 44 U/L (ref 13–60)
Lipase: 44 U/L (ref 13–60)

## 2020-12-04 LAB — MAGNESIUM
Magnesium: 2.2 mg/dL (ref 1.6–2.6)
Magnesium: 2.2 mg/dL (ref 1.6–2.6)

## 2020-12-04 LAB — EKG, 12 LEAD, INITIAL
Atrial Rate: 96 {beats}/min
Calculated P Axis: 3 degrees
Calculated R Axis: 18 degrees
Calculated T Axis: -3 degrees
P-R Interval: 114 ms
Q-T Interval: 352 ms
QRS Duration: 90 ms
QTC Calculation (Bezet): 444 ms
Ventricular Rate: 96 {beats}/min

## 2020-12-04 LAB — METABOLIC PANEL, COMPREHENSIVE
A-G Ratio: 1.8 (ref 1.1–2.2)
A-G Ratio: 1.1 (ref 1.1–2.2)
ALT (SGPT): 35 U/L (ref 10–50)
ALT (SGPT): 39 U/L (ref 12–78)
AST (SGOT): 31 U/L (ref 10–50)
AST (SGOT): 19 U/L (ref 15–37)
Albumin: 4.6 g/dL (ref 3.5–5.2)
Albumin: 3.5 g/dL (ref 3.5–5.0)
Alk. phosphatase: 130 U/L — ABNORMAL HIGH (ref 40–129)
Alk. phosphatase: 119 U/L — ABNORMAL HIGH (ref 45–117)
Anion gap: 10 mmol/L (ref 5–15)
Anion gap: 5 mmol/L (ref 5–15)
BUN/Creatinine ratio: 30 — ABNORMAL HIGH (ref 12–20)
BUN/Creatinine ratio: 22 — ABNORMAL HIGH (ref 12–20)
BUN: 16 MG/DL (ref 6–20)
BUN: 15 mg/dL (ref 6–20)
Bilirubin, total: 0.3 MG/DL (ref 0.2–1.0)
Bilirubin, total: 0.1 mg/dL — ABNORMAL LOW (ref 0.2–1.0)
CO2: 26 mmol/L (ref 22–29)
CO2: 25 mmol/L (ref 21–32)
Calcium: 9.5 MG/DL (ref 8.6–10.0)
Calcium: 8.3 mg/dL — ABNORMAL LOW (ref 8.5–10.1)
Chloride: 105 mmol/L (ref 98–107)
Chloride: 110 mmol/L — ABNORMAL HIGH (ref 97–108)
Creatinine: 0.54 MG/DL — ABNORMAL LOW (ref 0.70–1.20)
Creatinine: 0.68 mg/dL — ABNORMAL LOW (ref 0.70–1.30)
GFR est AA: >60 mL/min/{1.73_m2} (ref >60)
GFR est AA: >60 mL/min/{1.73_m2} (ref >60)
GFR est non-AA: >60 mL/min/{1.73_m2} (ref >60)
GFR est non-AA: >60 mL/min/{1.73_m2} (ref >60)
Globulin: 2.5 g/dL (ref 2.0–4.0)
Globulin: 3.1 g/dL (ref 2.0–4.0)
Glucose: 100 mg/dL (ref 65–100)
Glucose: 101 mg/dL — ABNORMAL HIGH (ref 65–100)
Potassium: 4.9 mmol/L (ref 3.5–5.1)
Potassium: 4.3 mmol/L (ref 3.5–5.1)
Protein, total: 7.1 g/dL (ref 6.4–8.3)
Protein, total: 6.6 g/dL (ref 6.4–8.2)
Sodium: 141 mmol/L (ref 136–145)
Sodium: 140 mmol/L (ref 136–145)

## 2020-12-04 LAB — CBC WITH AUTOMATED DIFF
ABS. BASOPHILS: 0 10*3/uL (ref 0.0–0.1)
ABS. BASOPHILS: 0 10*3/uL (ref 0.0–0.1)
ABS. EOSINOPHILS: 0.1 10*3/uL (ref 0.0–0.4)
ABS. EOSINOPHILS: 0.2 10*3/uL (ref 0.0–0.4)
ABS. IMM. GRANS.: 0 10*3/uL (ref 0.00–0.04)
ABS. IMM. GRANS.: 0 10*3/uL (ref 0.00–0.04)
ABS. LYMPHOCYTES: 1.4 10*3/uL (ref 0.8–3.5)
ABS. LYMPHOCYTES: 1.3 10*3/uL (ref 0.8–3.5)
ABS. MONOCYTES: 0.8 10*3/uL (ref 0.0–1.0)
ABS. MONOCYTES: 0.8 10*3/uL (ref 0.0–1.0)
ABS. NEUTROPHILS: 3.7 10*3/uL (ref 1.8–8.0)
ABS. NEUTROPHILS: 3.3 10*3/uL (ref 1.8–8.0)
ABSOLUTE NRBC: 0 10*3/uL (ref 0.00–0.01)
ABSOLUTE NRBC: 0 10*3/uL (ref 0.00–0.01)
BASOPHILS: 0 % (ref 0–1)
BASOPHILS: 0 % (ref 0–1)
EOSINOPHILS: 2 % (ref 0–7)
EOSINOPHILS: 4 % (ref 0–7)
HCT: 39.5 % (ref 36.6–50.3)
HCT: 36.2 % — ABNORMAL LOW (ref 36.6–50.3)
HGB: 13.1 g/dL (ref 12.1–17.0)
HGB: 11.7 g/dL — ABNORMAL LOW (ref 12.1–17.0)
IMMATURE GRANULOCYTES: 0 % (ref 0.0–0.5)
IMMATURE GRANULOCYTES: 0 % (ref 0–0.5)
LYMPHOCYTES: 23 % (ref 12–49)
LYMPHOCYTES: 23 % (ref 12–49)
MCH: 25.1 PG — ABNORMAL LOW (ref 26.0–34.0)
MCH: 25.2 PG — ABNORMAL LOW (ref 26.0–34.0)
MCHC: 33.2 g/dL (ref 30.0–36.5)
MCHC: 32.3 g/dL (ref 30.0–36.5)
MCV: 75.8 FL — ABNORMAL LOW (ref 80.0–99.0)
MCV: 77.8 FL — ABNORMAL LOW (ref 80.0–99.0)
MONOCYTES: 14 % — ABNORMAL HIGH (ref 5–13)
MONOCYTES: 14 % — ABNORMAL HIGH (ref 5–13)
MPV: 8.9 FL (ref 8.9–12.9)
MPV: 9 FL (ref 8.9–12.9)
NEUTROPHILS: 61 % (ref 32–75)
NEUTROPHILS: 59 % (ref 32–75)
NRBC: 0 PER 100 WBC
NRBC: 0 PER 100 WBC
PLATELET: 313 10*3/uL (ref 150–400)
PLATELET: 325 10*3/uL (ref 150–400)
RBC: 5.21 M/uL (ref 4.10–5.70)
RBC: 4.65 M/uL (ref 4.10–5.70)
RDW: 18.4 % — ABNORMAL HIGH (ref 11.5–14.5)
RDW: 17.7 % — ABNORMAL HIGH (ref 11.5–14.5)
WBC: 6.1 10*3/uL (ref 4.1–11.1)
WBC: 5.6 10*3/uL (ref 4.1–11.1)

## 2020-12-04 MED ORDER — SODIUM CHLORIDE 0.9% BOLUS IV
0.9 % | Freq: Once | INTRAVENOUS | Status: AC
Start: 2020-12-04 — End: 2020-12-04
  Administered 2020-12-04: 06:00:00 via INTRAVENOUS

## 2020-12-04 MED ORDER — ONDANSETRON (PF) 4 MG/2 ML INJECTION
4 mg/2 mL | INTRAMUSCULAR | Status: AC
Start: 2020-12-04 — End: 2020-12-04
  Administered 2020-12-04: 06:00:00 via INTRAVENOUS

## 2020-12-04 MED ORDER — IOPAMIDOL 76 % IV SOLN
370 mg iodine /mL (76 %) | Freq: Once | INTRAVENOUS | Status: AC
Start: 2020-12-04 — End: 2020-12-04
  Administered 2020-12-04: 06:00:00 via INTRAVENOUS

## 2020-12-04 MED ORDER — MORPHINE 4 MG/ML INTRAVENOUS SOLUTION
4 mg/mL | INTRAVENOUS | Status: AC
Start: 2020-12-04 — End: 2020-12-04
  Administered 2020-12-04: 06:00:00 via INTRAVENOUS

## 2020-12-04 MED FILL — MORPHINE 4 MG/ML SYRINGE: 4 mg/mL | INTRAMUSCULAR | Qty: 1

## 2020-12-04 MED FILL — SODIUM CHLORIDE 0.9 % IV: INTRAVENOUS | Qty: 1000

## 2020-12-04 MED FILL — ISOVUE-370  76 % INTRAVENOUS SOLUTION: 370 mg iodine /mL (76 %) | INTRAVENOUS | Qty: 100

## 2020-12-04 MED FILL — ONDANSETRON (PF) 4 MG/2 ML INJECTION: 4 mg/2 mL | INTRAMUSCULAR | Qty: 2

## 2020-12-04 NOTE — ED Notes (Signed)
[place] thinking he is having a heart attack. Told EMS he has a hx of x2 MI. Reports his CP just began approx 30+min PTA

## 2020-12-04 NOTE — ED Provider Notes (Signed)
ED Provider Notes by Amada Jupiter, MD at 12/04/20 [place]                Author: Amada Jupiter, MD  Service: Emergency Medicine  Author Type: Physician       Filed: 12/04/20 2154  Date of Service: 12/04/20 [place]  Status: Signed          Editor: Amada Jupiter, MD (Physician)               EMERGENCY DEPARTMENT HISTORY AND PHYSICAL EXAM           Date: 12/04/2020   Patient Name: Miguel Carr        History of Presenting Illness          Chief Complaint       Patient presents with        ?  Chest Pain           History Provided By: Patient and EMS      HPI: Miguel Carr, 50 y.o. male presents to the emergency department via EMS with substernal chest pain.  Patient reports the pain is squeezing  and pressure, without radiation, without noted aggravating relieving factors.  Patient reports the pain is moderate to severe in severity, denies associated nausea or vomiting, denies fevers or chills.  Patient reports the symptoms have been going on  for approximately 30 minutes.      There are no other complaints, changes, or physical findings at this time.      PCP: None        Current Facility-Administered Medications on File Prior to Encounter             Medication  Dose  Route  Frequency  Provider  Last Rate  Last Admin              ?  [COMPLETED] ondansetron (ZOFRAN) injection 4 mg   4 mg  IntraVENous  NOW  Alvin Critchley., MD     4 mg at 12/04/20 0159     ?  [COMPLETED] sodium chloride 0.9 % bolus infusion 1,000 mL   1,000 mL  IntraVENous  ONCE  Alvin Critchley., MD     Stopped at 12/04/20 0300     ?  [COMPLETED] morphine injection 4 mg   4 mg  IntraVENous  NOW  Alvin Critchley., MD     4 mg at 12/04/20 0159              ?  [COMPLETED] iopamidoL (ISOVUE-370) 76 % injection 100 mL   100 mL  IntraVENous  Harolyn Rutherford., MD     100 mL at 12/04/20 0223          Current Outpatient Medications on File Prior to Encounter          Medication  Sig  Dispense  Refill           ?   atorvastatin (LIPITOR) 10 mg tablet  Take 10 mg by mouth daily.         ?  lisinopriL (PRINIVIL, ZESTRIL) 20 mg tablet  Take 20 mg by mouth daily.         ?  aspirin 81 mg chewable tablet  Take 81 mg by mouth daily.               ?  nitroglycerin (NITROSTAT) 0.4 mg SL tablet  Take 1 Tab by mouth every five (5) minutes as  needed for Chest Pain. Sit/Lay down then put one tab under the tongue every 5 minutes as needed for chest  pain for 3 doses  1 Bottle  0             Past History        Past Medical History:     Past Medical History:        Diagnosis  Date         ?  Hypertension           ?  Myocardial infarct [facility])  2013           Past Surgical History:   No past surgical history on file.      Family History:   No family history on file.      Social History:     Social History          Tobacco Use         ?  Smoking status:  Never       Substance Use Topics         ?  Alcohol use:  No     ?  Drug use:  No             Comment: "used to use medical marijuana"           Allergies:     Allergies        Allergen  Reactions         ?  Benadryl [Diphenhydramine Hcl]  Other (comments)             "I can take oral, not IV. My whole body locks up."         ?  Reglan [Metoclopramide]  Other (comments)             Muscle spasms         ?  Toradol [Ketorolac Tromethamine]  Rash         ?  Tramadol  Rash             Review of Systems     Review of Systems   Review of Systems    Constitutional: Negative for chills and fever.    HENT: Negative for sinus pressure and sinus pain.     Eyes: Negative for photophobia and redness.    Respiratory: Negative for shortness of breath and wheezing.     Cardiovascular: Positive for chest pain and negative for palpitations.    Gastrointestinal: Negative for abdominal pain and nausea.    Genitourinary: Negative for flank pain and hematuria.    Musculoskeletal: Negative for arthralgias and gait problem.    Skin: Negative for color change and pallor.    Neurological: Negative for dizziness and  weakness.         Physical Exam     Physical Exam   Physical Exam   Constitutional:        General: No acute distress.     Appearance: Normal appearance.  Not toxic-appearing.   HENT:       Head: Normocephalic and atraumatic.      Nose: Nose normal.      Mouth/Throat:      Mouth: Mucous membranes are moist.   Eyes :       Extraocular Movements: Extraocular movements intact.      Pupils: Pupils are equal, round, and reactive to light.   Cardiovascular :       Rate and Rhythm: Normal  rate.      Pulses: Normal pulses.   Pulmonary:       Effort: Pulmonary effort is normal.      Breath sounds: No stridor.   Abdominal:      General: Abdomen is flat. There is no  distension.    Musculoskeletal:          General: Normal range of motion.      Cervical back: Normal range of motion and neck supple.    Skin:      General: Skin is warm and dry.      Capillary Refill: Capillary refill takes less than 2 seconds.   Neurological:       General: No focal deficit present.      Mental Status: Aert and oriented to person, place, and time.   Psychiatric:          Mood and Affect: Anxious appearing         Behavior: Behavior normal.         Lab and Diagnostic Study Results     Labs -         Recent Results (from the past 12 hour(s))     EKG, 12 LEAD, INITIAL          Collection Time: 12/04/20  5:59 PM         Result  Value  Ref Range            Ventricular Rate  96  BPM       Atrial Rate  96  BPM       P-R Interval  114  ms       QRS Duration  90  ms       Q-T Interval  352  ms       QTC Calculation (Bezet)  444  ms       Calculated P Axis  3  degrees       Calculated R Axis  18  degrees       Calculated T Axis  -3  degrees       Diagnosis                 Sinus rhythm with occasional Premature ventricular complexes   Otherwise normal ECG   When compared with ECG of 19-May-2016 18:15,   Premature ventricular complexes are now Present   Questionable change in QRS axis   T wave inversion now evident in Inferior leads   Confirmed by Geraldine Solar,  KWABENA ([id number]) on 12/04/2020 6:51:15 PM          CBC WITH AUTOMATED DIFF          Collection Time: 12/04/20  6:24 PM         Result  Value  Ref Range            WBC  5.6  4.1 - 11.1 K/uL       RBC  4.65  4.10 - 5.70 M/uL       HGB  11.7 (L)  12.1 - 17.0 g/dL       HCT  36.2 (L)  36.6 - 50.3 %       MCV  77.8 (L)  80.0 - 99.0 FL       MCH  25.2 (L)  26.0 - 34.0 PG       MCHC  32.3  30.0 - 36.5 g/dL       RDW  17.7 (H)  11.5 - 14.5 %  PLATELET  325  150 - 400 K/uL       MPV  9.0  8.9 - 12.9 FL       NRBC  0.0  0.0 PER 100 WBC       ABSOLUTE NRBC  0.00  0.00 - 0.01 K/uL       NEUTROPHILS  59  32 - 75 %       LYMPHOCYTES  23  12 - 49 %       MONOCYTES  14 (H)  5 - 13 %       EOSINOPHILS  4  0 - 7 %       BASOPHILS  0  0 - 1 %       IMMATURE GRANULOCYTES  0  0 - 0.5 %       ABS. NEUTROPHILS  3.3  1.8 - 8.0 K/UL       ABS. LYMPHOCYTES  1.3  0.8 - 3.5 K/UL       ABS. MONOCYTES  0.8  0.0 - 1.0 K/UL       ABS. EOSINOPHILS  0.2  0.0 - 0.4 K/UL       ABS. BASOPHILS  0.0  0.0 - 0.1 K/UL       ABS. IMM. GRANS.  0.0  0.00 - 0.04 K/UL       DF  AUTOMATED          METABOLIC PANEL, COMPREHENSIVE          Collection Time: 12/04/20  6:24 PM         Result  Value  Ref Range            Sodium  140  136 - 145 mmol/L       Potassium  4.3  3.5 - 5.1 mmol/L       Chloride  110 (H)  97 - 108 mmol/L       CO2  25  21 - 32 mmol/L       Anion gap  5  5 - 15 mmol/L       Glucose  101 (H)  65 - 100 mg/dL       BUN  15  6 - 20 mg/dL       Creatinine  0.68 (L)  0.70 - 1.30 mg/dL       BUN/Creatinine ratio  22 (H)  12 - 20         GFR est AA  >60  >60 ml/min/1.31m       GFR est non-AA  >60  >60 ml/min/1.732m      Calcium  8.3 (L)  8.5 - 10.1 mg/dL       Bilirubin, total  0.1 (L)  0.2 - 1.0 mg/dL       AST (SGOT)  19  15 - 37 U/L       ALT (SGPT)  39  12 - 78 U/L       Alk. phosphatase  119 (H)  45 - 117 U/L       Protein, total  6.6  6.4 - 8.2 g/dL       Albumin  3.5  3.5 - 5.0 g/dL       Globulin  3.1  2.0 - 4.0 g/dL       A-G Ratio  1.1  1.1  - 2.2         TROPONIN-HIGH SENSITIVITY          Collection Time:  12/04/20  6:24 PM         Result  Value  Ref Range            Troponin-High Sensitivity  8  0 - 76 ng/L           Radiologic Studies -    '@lastxrresult'$ @     CT Results  (Last 48 hours)                                       12/04/20 0223    CT ABD PELV W CONT  Final result            Impression:    No evidence of acute abdominal or pelvic process. Normal appendix. Large amount      of stool in the rectum.                       Narrative:    EXAM: CT ABD PELV W CONT             INDICATION: RLQ pain, Right flank pain              COMPARISON: 12/04/2020              CONTRAST: 100 mL of Isovue-370.             ORAL CONTRAST: None             TECHNIQUE:       Following the uneventful intravenous administration of contrast, thin axial      images were obtained through the abdomen and pelvis. Coronal and sagittal      reconstructions were generated. CT dose reduction was achieved through use of a      standardized protocol tailored for this examination and automatic exposure      control for dose modulation.             FINDINGS:       LOWER THORAX: No significant abnormality in the incidentally imaged lower chest.      LIVER: No mass.      BILIARY TREE: Gallbladder is within normal limits. CBD is not dilated.      SPLEEN: within normal limits.      PANCREAS: No mass or ductal dilatation.      ADRENALS: Unremarkable.      KIDNEYS: No mass, calculus, or hydronephrosis.       STOMACH: Unremarkable.      SMALL BOWEL: No dilatation or wall thickening.      COLON: No dilatation or wall thickening. Large amount of stool in the rectum.      APPENDIX: Normal      PERITONEUM: No ascites or pneumoperitoneum.      RETROPERITONEUM: No lymphadenopathy or aortic aneurysm.      REPRODUCTIVE ORGANS: Unremarkable.      URINARY BLADDER: No mass or calculus.      BONES: No destructive bone lesion.      ABDOMINAL WALL: No mass or hernia.      ADDITIONAL COMMENTS: N/A                                       CXR Results  (Last 48 hours)  12/04/20 1835    XR CHEST PORT  Final result            Impression:    Clear lungs.                       Narrative:    PORTABLE CHEST RADIOGRAPH/S: 12/04/2020 6:35 PM             INDICATION: Chest pain.             COMPARISON: 02/24/2019, CT 05/15/2016.             TECHNIQUE: Portable frontal upright radiograph/s of the chest.             FINDINGS:       The lungs are clear. The central airways are patent. No pneumothorax or pleural      effusion.                                          Medical Decision Making and ED Course     Differential Diagnosis & Medical Decision Making Provider Note:          - I am the first provider for this patient.  I reviewed the vital signs, available nursing notes, past medical history, past surgical history, family history and social history. The patients presenting problems have been discussed, and they are in agreement  with the care plan formulated and outlined with them.  I have encouraged them to ask questions as they arise throughout their visit.      Vital Signs-Reviewed the patient's vital signs.   Patient Vitals for the past 12 hrs:            Temp  Pulse  Resp  BP  SpO2            12/04/20 1759  98.3 ??F (36.8 ??C)  91  17  119/70  96 %           E     Disposition     Disposition: DC- Adult Discharges: All of the diagnostic tests were reviewed and questions answered. Diagnosis, care plan  and treatment options were discussed.  The patient understands the instructions and will follow up as directed. The patients results have been reviewed with them.  They have been counseled regarding their diagnosis.  The patient verbally convey understanding  and agreement of the signs, symptoms, diagnosis, treatment and prognosis and additionally agrees to follow up as recommended with their PCP in 24 - 48 hours.  They also agree with the care-plan and convey that all of their questions have been  answered.   I have also put together some discharge instructions for them that include: 1) educational information regarding their diagnosis, 2) how to care for their diagnosis at home, as well a 3) list of reasons why they would want to return to the ED prior to  their follow-up appointment, should their condition change.   DC-The patient was given verbal chest pain warning signs and instructions      DISCHARGE PLAN:   1.      Current Discharge Medication List                 CONTINUE these medications which have NOT CHANGED          Details        atorvastatin (LIPITOR)  10 mg tablet  Take 10 mg by mouth daily.               lisinopriL (PRINIVIL, ZESTRIL) 20 mg tablet  Take 20 mg by mouth daily.               aspirin 81 mg chewable tablet  Take 81 mg by mouth daily.               nitroglycerin (NITROSTAT) 0.4 mg SL tablet  Take 1 Tab by mouth every five (5) minutes as needed for Chest Pain. Sit/Lay down then put one tab under the tongue every 5 minutes as needed for chest pain  for 3 doses   Qty: 1 Bottle, Refills: 0                      2.      Follow-up Information                  Follow up With  Specialties  Details  Why  Contact Info              [place] [facility]    Schedule an appointment as soon as possible for a visit     [place] [facility]              Rudene Anda, MD  Cardio Vascular Surgery, Cardiovascular Disease Physician  Schedule an appointment as soon as possible for a visit     [address]   [address]   [place] [address]   [phone]                     3.  Return to ED if worse    4.      Current Discharge Medication List                Remove if admitted/transferred        Diagnosis/Clinical Impression        Clinical Impression:       1.  Chest pain, unspecified type         2.  Anxiety about health            Attestations: I,  Amada Jupiter, MD, am the primary clinician of record.      Please note that  this dictation was completed with Dragon, the computer voice recognition software.  Quite often unanticipated grammatical, syntax, homophones, and other interpretive errors are inadvertently  transcribed by the computer software.  Please disregard these errors.  Please excuse any errors that have escaped final proofreading.  Thank you.

## 2020-12-04 NOTE — ED Provider Notes (Signed)
Patient is a 50 year old male with a complex and very medical history presenting to the ED with acute right lower quadrant pain that radiates around his right flank to his back.  Patient denies any surgical history.  Patient recently seen for complex migraine receiving TN K prior to the diagnosis.  No focal neurologic deficits.  Patient not having headache at this time.  Patient denies nausea or vomiting.  Denies alcohol use or marijuana use.    The history is provided by the patient and medical records.   Abdominal Pain   This is a new problem. The current episode started 1 to 2 hours ago. The problem occurs constantly. The problem has not changed since onset.The pain is associated with an unknown factor. The pain is located in the RLQ. The quality of the pain is sharp. The pain is at a severity of 9/10. The pain is severe. Pertinent negatives include no diarrhea, no nausea, no vomiting, no constipation, no dysuria and no chest pain. The pain is worsened by certain positions and palpation. The pain is relieved by Nothing. His past medical history is significant for GERD. His past medical history does not include pancreatitis or diverticulitis. The patient's surgical history non-contributory.     Past Medical History:   Diagnosis Date    Hypertension     Myocardial infarct ([facility]) 2013       No past surgical history on file.      History reviewed. No pertinent family history.    Social History     Socioeconomic History    Marital status: DIVORCED     Spouse name: Not on file    Number of children: Not on file    Years of education: Not on file    Highest education level: Not on file   Occupational History    Not on file   Tobacco Use    Smoking status: Never    Smokeless tobacco: Not on file   Substance and Sexual Activity    Alcohol use: No    Drug use: No     Comment: "used to use medical marijuana"    Sexual activity: Not on file   Other Topics Concern    Not on file   Social History Narrative    Not on file      Social Determinants of Health     Financial Resource Strain: Not on file   Food Insecurity: Not on file   Transportation Needs: Not on file   Physical Activity: Not on file   Stress: Not on file   Social Connections: Not on file   Intimate Partner Violence: Not on file   Housing Stability: Not on file         ALLERGIES: Benadryl [diphenhydramine hcl], Reglan [metoclopramide], Toradol [ketorolac tromethamine], and Tramadol    Review of Systems   HENT: Negative.     Eyes: Negative.    Respiratory: Negative.     Cardiovascular: Negative.  Negative for chest pain.   Gastrointestinal:  Positive for abdominal pain. Negative for abdominal distention, constipation, diarrhea, nausea and vomiting.   Endocrine: Negative.    Genitourinary: Negative.  Negative for dysuria.   Musculoskeletal: Negative.    Skin: Negative.    Allergic/Immunologic: Negative.    Neurological: Negative.    Hematological: Negative.    Psychiatric/Behavioral: Negative.       Vitals:    12/04/20 0045 12/04/20 0100 12/04/20 0115 12/04/20 0205   BP: 117/82 126/82 129/89 135/79   Pulse:  Resp:       Temp:       SpO2: 97% 98% 96% 99%   Weight:       Height:                Physical Exam  Vitals and nursing note reviewed.   Constitutional:       General: He is not in acute distress.     Appearance: Normal appearance.   HENT:      Head: Normocephalic and atraumatic.      Right Ear: External ear normal.      Left Ear: External ear normal.      Nose: Nose normal.   Eyes:      Extraocular Movements: Extraocular movements intact.      Conjunctiva/sclera: Conjunctivae normal.   Cardiovascular:      Rate and Rhythm: Normal rate.      Pulses: Normal pulses.           Radial pulses are 2+ on the right side and 2+ on the left side.      Heart sounds: Normal heart sounds.   Pulmonary:      Effort: Pulmonary effort is normal.      Breath sounds: Normal breath sounds.   Chest:      Chest wall: No deformity or tenderness.   Abdominal:      General: Abdomen is flat.  Bowel sounds are normal. There is no distension.      Palpations: Abdomen is soft.      Tenderness: There is abdominal tenderness in the right lower quadrant. There is guarding.   Musculoskeletal:         General: No deformity or signs of injury. Normal range of motion.      Cervical back: Normal range of motion and neck supple. No tenderness.   Skin:     General: Skin is warm and dry.      Capillary Refill: Capillary refill takes less than 2 seconds.   Neurological:      General: No focal deficit present.      Mental Status: He is alert and oriented to person, place, and time.   Psychiatric:         Attention and Perception: Attention normal.         Mood and Affect: Mood normal.         Behavior: Behavior normal.        MDM         LABORATORY RESULTS:  Labs Reviewed   CBC WITH AUTOMATED DIFF - Abnormal; Notable for the following components:       Result Value    MCV 75.8 (*)     MCH 25.1 (*)     RDW 18.4 (*)     MONOCYTES 14 (*)     All other components within normal limits   METABOLIC PANEL, COMPREHENSIVE - Abnormal; Notable for the following components:    Creatinine 0.54 (*)     BUN/Creatinine ratio 30 (*)     Alk. phosphatase 130 (*)     All other components within normal limits   URINE CULTURE HOLD SAMPLE   LIPASE   MAGNESIUM   URINALYSIS W/MICROSCOPIC       IMAGING RESULTS:  CT ABD PELV W CONT   Final Result   No evidence of acute abdominal or pelvic process. Normal appendix. Large amount   of stool in the rectum.          MEDICATIONS GIVEN:  Medications   ondansetron (ZOFRAN) injection 4 mg (4 mg IntraVENous Given 12/04/20 0159)   sodium chloride 0.9 % bolus infusion 1,000 mL (0 mL IntraVENous Stopped 12/04/20 0300)   morphine injection 4 mg (4 mg IntraVENous Given 12/04/20 0159)   iopamidoL (ISOVUE-370) 76 % injection 100 mL (100 mL IntraVENous Given 12/04/20 0223)       Differential diagnosis: Appendicitis, hernia, kidney stone, constipation, small bowel obstruction    ED physician interpretation of  laboratory results: Lab work without critical values.  No leukocytosis, kidney dysfunction.    MDM: Patient is a 50 year old male with a complex medical history presenting to ED with right lower quadrant pain and flank pain with unremarkable CT scan and lab work was pain improved with treatment here in the ED including fluids and IV morphine.  No signs of hernia, appendicitis or other surgical abnormalities.  Patient does have a significant amount of stool in the rectum but patient denies any constipation or difficulty with stooling.  States he goes to bathroom every other day without difficulty and that has been the way it has been for a long time.  As patient has no surgical findings, stable vital signs, lab work and feeling better discharge is appropriate.    Further personalized recommendations for outpatient care as below.    Key discharge instructions and summary of care: You presented to the ED with right lower quadrant pain and right flank pain.  Your labs are stable.  Your CT scan shows no signs of appendicitis, diverticulitis, kidney stone.  You do have a moderate stool burden in your rectum but as you have no problems with stooling or constipation at this time this is likely the normal.  You may want to try taking MiraLAX to increase her bowel movements to daily bowel movements.  Please follow-up with your primary care doctor.  Do not have a primary care doctor use information or discharge paperwork to obtain a primary care doctor.    The patient has been re-evaluated and feeling better. Patient is stable for discharge.  All available radiology and laboratory results have been reviewed with patient and/or available family.  Patient and/or family verbally conveyed their understanding and agreement of the patient's signs, symptoms, diagnosis, treatment and prognosis and additionally agree to follow-up as recommended in the discharge instructions or to return to the Emergency Department should their  condition change or worsen prior to their follow-up appointment.  All questions have been answered and patient and/or available family express understanding.      IMPRESSION:  1. Abdominal pain, right lower quadrant    2. Right flank pain        DISPOSITION: Discharged     Renia Mikelson D. Laverle Patter, MD      Procedures

## 2020-12-04 NOTE — ED Notes (Signed)
 Pt woken to reassess. Pt restless and upset. He is hitting himself in the head with both hands. Pt put his shoes on and said he has to act up to be noticed.     Advsd pt that MD will come see him when he is able.

## 2020-12-04 NOTE — ED Notes (Signed)
Patient BIBA from the street with the complaint of severe R lower abdominal pain that started 10 minutes prior to arrival. Patient states that it feels better when he releases pressure.  EMS BG 88

## 2020-12-04 NOTE — ED Notes (Signed)
Shift report given to Megan RN

## 2020-12-04 NOTE — ED Notes (Signed)
I have reviewed discharge instructions with the patient.  The patient verbalized understanding.  Patient ambulatory from ED with steady even gait and NAD noted.

## 2020-12-05 ENCOUNTER — Inpatient Hospital Stay: Admit: 2020-12-05 | Discharge: 2020-12-05 | Disposition: A | Discharge status: Home / Self Care

## 2020-12-05 LAB — TROPONIN, HIGH SENSITIVITY: Troponin, High Sensitivity: 8 ng/L (ref 0–76)

## 2020-12-05 LAB — TROPONIN-HIGH SENSITIVITY: Troponin-High Sensitivity: 8 ng/L (ref 0–76)

## 2020-12-05 NOTE — ED Notes (Signed)
Patient stated that he walked 20 miles from [facility] to [facility] today in 90 degree heat, EMS stated that patient was sweating excessively & complaining of generalized weakness.

## 2020-12-06 LAB — EKG 12-LEAD
Atrial Rate: 97 {beats}/min
P Axis: 32 degrees
P-R Interval: 116 ms
Q-T Interval: 339 ms
QRS Duration: 98 ms
QTc Calculation (Bazett): 433 ms
R Axis: 58 degrees
T Axis: 31 degrees
Ventricular Rate: 98 {beats}/min

## 2020-12-06 LAB — EKG, 12 LEAD, INITIAL
Atrial Rate: 97 {beats}/min
Calculated P Axis: 32 degrees
Calculated R Axis: 58 degrees
Calculated T Axis: 31 degrees
P-R Interval: 116 ms
Q-T Interval: 339 ms
QRS Duration: 98 ms
QTC Calculation (Bezet): 433 ms
Ventricular Rate: 98 {beats}/min

## 2020-12-06 LAB — RESPIRATORY PANEL EXTENDED CARE EVERYWHERE
ADENOVIRUS CARE EVERYWHERE: NOT DETECTED
CHLAMYDIA PNEUMONIA CARE EVERYWHERE: NOT DETECTED
CORONAVIRUS 229E CARE EVERYWHERE: NOT DETECTED
COVID-19 CARE EVERYWHERE: NOT DETECTED
HUMAN METAPNEUMOVIRUS CARE EVERYWHERE: NOT DETECTED
HUMAN RHINOVIRUS/ENTEROVIRUS CARE EVERYWHERE: NOT DETECTED
INFLUENZA A CARE EVERYWHERE: NOT DETECTED
INFLUENZA A H1 CARE EVERYWHERE: NOT DETECTED
INFLUENZA A H1-2009 CARE EVERYWHERE: NOT DETECTED
INFLUENZA A H3 CARE EVERYWHERE: NOT DETECTED
INFLUENZA B CARE EVERYWHERE: NOT DETECTED
MYCOPLASMA PNEUMONIAE CARE EVERYWHERE: NOT DETECTED
PARAINFLUENZA 1 CARE EVERYWHERE: NOT DETECTED
PARAINFLUENZA 2 CARE EVERYWHERE: NOT DETECTED
PARAINFLUENZA 3 CARE EVERYWHERE: NOT DETECTED
PARAINFLUENZA 4 CARE EVERYWHERE: NOT DETECTED
RSV A CARE EVERYWHERE: NOT DETECTED
RSV B CARE EVERYWHERE: NOT DETECTED

## 2020-12-06 LAB — APTT CARE EVERYWHERE: APTT CARE EVERYWHERE: 29.8 s (ref 24.9–40.0)

## 2020-12-06 LAB — PROTHROMBIN TIME CARE EVERYWHERE
INR CARE EVERYWHERE: 0.9
PROTHROMBIN TIME CARE EVERYWHERE: 13.2 s (ref 11.7–14.5)

## 2020-12-07 LAB — HEMOGLOBIN A1C CARE EVERYWHERE
ESTIMATED AVERAGE GLUCOSE CARE EVERYWHERE: 103 mg/dL
HEMOGLOBIN A1C CARE EVERYWHERE: 5.2 % (ref 4.8–5.6)

## 2020-12-07 LAB — RESPIRATORY PANEL BASIC CARE EVERYWHERE
COVID-19 CARE EVERYWHERE: NEGATIVE
INFLUENZA A CARE EVERYWHERE: NEGATIVE
INFLUENZA B CARE EVERYWHERE: NEGATIVE
RSV CARE EVERYWHERE: NEGATIVE

## 2020-12-08 LAB — BMP (EXT)
Anion Gap (EXT): 9 (ref 3–11)
BUN (EXT): 16 mg/dL (ref 7–25)
CO2 (EXT): 24 mmol/L (ref 21–31)
CalciumCalcium (EXT): 9.1 mg/dL (ref 8.8–10.6)
Chloride (EXT): 105 mmol/L (ref 99–108)
Creatinine (EXT): 0.63 mg/dL — ABNORMAL LOW (ref 0.67–1.20)
Glucose (EXT): 75 mg/dL (ref 70–199)
Osmolality (EXT): 276 mosm/kg (ref 270–295)
Potassium (EXT): 4.2 mmol/L (ref 3.5–5.1)
Sodium (EXT): 138 mmol/L (ref 136–145)

## 2020-12-08 LAB — PROTHROMBIN TIME CARE EVERYWHERE
INR CARE EVERYWHERE: 1.05 (ref <1.30)
PROTHROMBIN TIME CARE EVERYWHERE WAKEMED HEALTH & HOSPITALS: 11.8 s (ref 9.9–12.7)

## 2020-12-08 LAB — APTT CARE EVERYWHERE: APTT CARE EVERYWHERE: 32 s (ref 24–37)

## 2020-12-08 LAB — ABO/RH CARE EVERYWHERE: ABO/RH CARE EVERYWHERE: A POS

## 2020-12-08 LAB — TYPE AND SCREEN CARE EVERYWHERE
ANTIBODY SCREEN CARE EVERYWHERE: NEGATIVE
RH TYPE CARE EVERYWHERE: POSITIVE

## 2020-12-08 LAB — ESTIMATED GLOMERULAR FILTRATION RATE CARE EVERYWHERE: ESTIMATED GFR CARE EVERYWHERE: >60 mL/min/{1.73_m2}

## 2020-12-09 LAB — BMP (EXT)
Anion Gap (EXT): 9 (ref 3–11)
BUN (EXT): 16 mg/dL (ref 7–25)
CO2 (EXT): 22 mmol/L (ref 21–31)
CalciumCalcium (EXT): 9.3 mg/dL (ref 8.8–10.6)
Chloride (EXT): 107 mmol/L (ref 99–108)
Creatinine (EXT): 0.68 mg/dL (ref 0.67–1.20)
Glucose (EXT): 92 mg/dL (ref 70–199)
Osmolality (EXT): 277 mosm/kg (ref 270–295)
Potassium (EXT): 4.2 mmol/L (ref 3.5–5.1)
Sodium (EXT): 138 mmol/L (ref 136–145)

## 2020-12-09 LAB — ESTIMATED GLOMERULAR FILTRATION RATE CARE EVERYWHERE: ESTIMATED GFR CARE EVERYWHERE: >60 mL/min/{1.73_m2}

## 2020-12-12 NOTE — ED Notes (Signed)
 Formatting of this note might be different from the original.  Security screened Pt for contraband. Pt belongings removed and placed in Bin 4 (grey backpack and Pt belonging bag). Pt placed in green gown  Electronically signed by Rhett Bannister, RN at 12/12/2020 21:59 EDT

## 2020-12-12 NOTE — Assessment & Plan Note (Signed)
 Formatting of this note might be different from the original.  Poison control contacted regarding patients case. Recommendations included, evaluating the patient for an additional 6hrs, repeat EKG in 2 hrs to reevaluate QTC/QT range and QRS rate. No additional labs or recommendations given at this time.   Electronically signed by Avon Gully, RN at 12/12/2020 18:20 EDT

## 2020-12-12 NOTE — Unmapped (Signed)
 Formatting of this note might be different from the original.  [facility] 12/12/2020 21:29  Yes Menu   Called [facility] ED and spoke with Tori RN to inform her the pt kept falling asleep and refused to participate in the assessment. I informed her as the monitor was being wheeled up he stated he did not try and kill himself. When I began the assessment the pt stated he was just depressed and didn't try to kill himself and then stated no wait i did try to kill myself by cutting my wrists. He then stated he tried to kill himself by taking 8 aspirin. Pt ate an entire sandwich and then fell asleep and refused to participate in BHA. Tori RN states Dr. Kyung Rudd is not going to lift the 1013 as the pt stated he wanted to kill himself. Chart review completed at this time with Tori RN and [facility] will begin seeking placement once ED provider note is finalized and 1013 is received.  Electronically signed by Bjorn Loser, LCSW at 12/12/2020 21:32 EDT

## 2020-12-12 NOTE — ED Notes (Signed)
 Formatting of this note might be different from the original.  Pt refused repeat EKG. EKG was ordered due to [employer] advice.  Electronically signed by Rhett Bannister, RN at 12/12/2020 19:51 EDT

## 2020-12-12 NOTE — Nursing Note (Signed)
 Formatting of this note might be different from the original.  [facility] consult and medical clearance orders received. 1013 requested, if applicable. In queue for the BHA.    Electronically signed by Vincent Peyer at 12/12/2020 20:36 EDT

## 2020-12-12 NOTE — Unmapped (Signed)
 Formatting of this note might be different from the original.  [facility] 12/12/2020 21:49  Yes Menu   Chart Review completed with Tori RN as pt was refusing to participate in BHA. Waiting for finalized ED provider note to seek placement.  Electronically signed by Bjorn Loser, LCSW at 12/12/2020 21:49 EDT

## 2020-12-12 NOTE — ED Provider Notes (Signed)
 Formatting of this note is different from the original.  History     Chief Complaint   Patient presents with   ? Psychiatric Evaluation     Pt brought in via police after he called from [employer] stating that he had taken x8 Tylenol tablets in attempt to kill himself. Pt was seen in ER this am for chest pain and discharged. Pt appears to have [place] in the past two [place]. Pt is homeless.      50 year old male brought in by police under 1013 for suicidal ideation.  Per reports, patient was at [employer] today at 11:00 when he took 8 500 mg Tylenol tablets in an attempt to kill himself.  Patient just recently seen in the ER for chest pain and referred for outpatient follow-up, discharge, reports he has a lot of different issues going on and he has become very depressed and wanted to kill himself today.  Denies HI.  Endorses some auditory hallucinations.  Denies any additional ingestions.  Some right-sided shoulder pain, worse with certain movements, chest pain resolved, denies shortness of breath or nausea or vomiting, leg pain or swelling.  Denies fever or rash.  Denies abdominal pain.     History reviewed. No pertinent past medical history.    History reviewed. No pertinent surgical history.    No family history on file.    Social History     Socioeconomic History   ? Marital status: Divorced   Tobacco Use   ? Smoking status: Current Every Day Smoker   ? Smokeless tobacco: Never Used     Additional Smoking     E-Cigarettes/Vaping Use     E-Cigarette/Vaping Substances     Review of Systems   Constitutional: Negative for chills and fever.   HENT: Negative for sore throat and trouble swallowing.    Eyes: Negative for discharge and redness.   Respiratory: Negative for cough and shortness of breath.    Cardiovascular: Negative for chest pain, palpitations and leg swelling.   Gastrointestinal: Negative for abdominal pain, diarrhea, nausea and vomiting.   Genitourinary: Negative for  dysuria and hematuria.   Musculoskeletal: Negative for back pain, neck pain and neck stiffness.   Skin: Negative for rash and wound.   Neurological: Negative for syncope and headaches.     Physical Exam     ED Triage Vitals [12/12/20 1537]   BP 127/72   Heart Rate 79   Resp 16   Temp 36.9 C (98.4 F)   SpO2 95 %   O2 Device    O2 Flow Rate (L/min)      Physical Exam  Constitutional:       General: He is not in acute distress.     Appearance: Normal appearance. He is normal weight. He is not toxic-appearing.   HENT:      Head: Normocephalic and atraumatic.      Nose: Nose normal.      Mouth/Throat:      Mouth: Mucous membranes are moist.   Eyes:      Extraocular Movements: Extraocular movements intact.      Pupils: Pupils are equal, round, and reactive to light.   Cardiovascular:      Rate and Rhythm: Normal rate and regular rhythm.      Pulses: Normal pulses.      Heart sounds: Normal heart sounds.   Pulmonary:      Effort: Pulmonary effort is normal.  Breath sounds: Normal breath sounds.   Abdominal:      General: There is no distension.      Palpations: Abdomen is soft.      Tenderness: There is no abdominal tenderness.   Musculoskeletal:         General: No swelling or tenderness. Normal range of motion.      Cervical back: Normal range of motion and neck supple.   Skin:     General: Skin is warm and dry.   Neurological:      General: No focal deficit present.      Mental Status: He is alert and oriented to person, place, and time. Mental status is at baseline.      Cranial Nerves: No cranial nerve deficit.      Sensory: No sensory deficit.      Motor: No weakness.   Psychiatric:         Mood and Affect: Mood normal.         Behavior: Behavior normal.     ED Course     Procedure(s):  Procedures    EKG:  Results for orders placed or performed during the hospital encounter of 12/12/20   ECG 12 lead   Result Value Ref Range    Ventricular Rate 71 BPM    Atrial Rate 71 BPM    PR Interval 122 ms    QRS Duration  108 ms    QT Interval 398 ms    QTC Calculation 432 ms    P Axis 43 degrees    R Axis 35 degrees    T Wave Axis 36 degrees    Impression    Normal sinus rhythm with sinus arrhythmia  Normal ECG  INT TIME: 1550  Confirmed by DAMMEYER,M.D., CHRISTOPHER ([id number]), editor Effie Shy, TASHA [phone]) on 12/12/2020 5:24:29 PM     EKG Procedure Information  Interpreted by: ED Physician  Reason for EKG: Chest pain  Quick EKG Clinical Impression: Normal EKG  Rate: normal  Rhythm: sinus rhythm  Interval: Normal  ST Segments  ST Segments: Normal    Laboratory Data:  Results for orders placed or performed during the hospital encounter of 12/12/20   CBC with Differential    Collection Time: 12/12/20 16:02   Result Value Ref Range    WBC 6.5 4.5 - 11.0 K/uL    RBC 4.92 4.50 - 5.90 M/uL    Hemoglobin 12.4 (L) 13.5 - 17.5 g/dL    Hematocrit 86.5 (L) 41.0 - 53.0 %    MCV 76.8 (L) 80.0 - 95.0 fL    MCH 25.2 25.0 - 35.0 pg    MCHC 32.8 30.0 - 36.0 g/dL    RDW 78.4 (H) 69.6 - 14.6 %    MPV 8.9 No Established Reference Range fL    Neutrophils % 64.5 40.0 - 70.0 %    Lymphocytes % 21.9 15.0 - 45.0 %    Monocytes % 10.5 (H) 1.0 - 8.0 %    Eosinophils % 2.8 0.0 - 6.0 %    BASO % 0.3 0.0 - 2.0 %    MONO # 0.7 No Established Reference Range K/uL    EOS # 0.2 No Established Reference Range K/uL    BASO # 0.0 No Established Reference Range K/uL    Platelets 326 150 - 450 K/uL    nRBC 0 0 - 0 /100WBC    LYMPH # 1.4 No Established Reference Range K/uL    ABS NEUT # 4.16 No  Established Reference Range K/uL   Comprehensive metabolic panel    Collection Time: 12/12/20 16:02   Result Value Ref Range    Glucose 85.01 74 - 106 mg/dL    Sodium 952.84 (L) 132 - 145 mMOL/L    Potassium 3.9 3.5 - 5.1 mMOL/L    Chloride 102.98 98 - 107 mMOL/L    CO2 22 21 - 31 mMOL/L    Anion Gap 16 10 - 20    BUN 14 9 - 21 mg/dL    Creatinine 4.40 1.02 - 1.25 mg/dL    Calcium 9.0 8.5 - 72.5 mg/dL    Protein 6.9 6.0 - 8.4 g/dL    Albumin 4.2 3.0 - 5.0 g/dL    A/G Ratio 1.6 >=3.6     Globulin 2.7 1.5 - 3.8 g/dL    Total Bilirubin 0.3 0.2 - 1.3 mg/dL    AST 30 5 - 49 U/L    Alkaline Phosphatase 99.07 38 - 126 U/L    ALT 46 3 - 50 U/L    eGFR (2021) 117 >60 mL/min/1.73 m2   Ethanol    Collection Time: 12/12/20 16:02   Result Value Ref Range    Ethanol <10 Non-Detectable (<10) mg/dL   Acetaminophen level    Collection Time: 12/12/20 16:02   Result Value Ref Range    Acetaminophen <10.0 <=30.0 mcg/mL   Salicylate level    Collection Time: 12/12/20 16:02   Result Value Ref Range    Salicylate <1.0 (L) 2.8 - 20.0 mg/dL   Troponin I    Collection Time: 12/12/20 16:02   Result Value Ref Range    Troponin I <0.020 <0.034 ng/mL   Drug Screen (Urine)    Collection Time: 12/12/20 19:50   Result Value Ref Range    UR Amphetamine Negative Negative    UR Barbituate Negative Negative    UR Benzodiazepine Negative Negative    UR Cocaine Negative Negative    UR Opiates Negative Negative    UR Cannabinoid (THC) Positive (A) Negative    UR Creatinine Random 49.57 1.20 - 346.00 mg/dL   URINE DIPSTICK POCT    Collection Time: 12/12/20 21:13   Result Value Ref Range    Color, UA Yellow Yellow    Clarity, UA Clear Clear    Glucose, UA Negative Negative mg/dL    Bilirubin, UA Negative Negative    Ketones, UA Negative Negative mg/dL    Spec Grav, UA 6.440 1.005 - 1.030    Blood, UA Negative Negative    pH, UA  6.0 5.0 - 8.0    Protein, UA Negative Negative mg/dL    Urobilinogen, UA 0.2 0.0 - 0.2 E.U./dL    Nitrite, UA Negative Negative    Leukocytes, UA Negative Negative     Imaging:  Results for orders placed or performed during the hospital encounter of 12/12/20   X-ray chest AP only    Narrative     [id number]    XR CHEST AP ONLY    HISTORY:  Cardiopulmonary symptoms     COMPARISON:  Chest x-ray same day    FINDINGS:   Lungs/Pleura: Normal.    Heart/Mediastinum: Normal.    Bones/Soft Tissues: Normal for age.    Partially visualized multiple gaseous distended loops of bowel the upper abdomen.     Impression    Nothing  acute.    The attending radiologist has reviewed all images, agrees with the interpretation, and provided appropriate supervision for this exam.  Interpreted By: Felicie Morn   12/12/2020 3:45 PM                                 Electronically signed by: Kaleen Mask, MD  12/12/2020 3:49 PM   X-ray shoulder right AP internal AP external axillary    Narrative     [id number]    XR SHOULDER RIGHT AP INTERNAL AP EXTERNAL AXILLARY    HISTORY:  shoulder pain     COMPARISON:  None.    FINDINGS:    Bones:  No acute fracture.    Joints:  Normal.    Soft Tissues:  Normal.     Impression    No acute bony injury.    The attending radiologist has reviewed all images, agrees with the interpretation, and provided appropriate supervision for this exam.    Interpreted By: Laurence Aly   12/12/2020 3:48 PM                                 Electronically signed by: Kaleen Mask, MD  12/12/2020 3:55 PM     Most Recent Vitals:  BP: 106/55 (12/12/20 2206)  Heart Rate: 65 (12/12/20 2206)  Resp: 16 (12/12/20 2206)  Temp: 36.9 C (98.4 F) (12/12/20 1537)  SpO2: 97 % (12/12/20 2206)        MDM  Number of Diagnoses or Management Options  Chest pain, unspecified type  Right shoulder pain, unspecified chronicity  Suicidal ideations  Diagnosis management comments: 50 year old male brought in by police for suicidal ideation, reported Tylenol ingestion.  Reviewed records, seen previously in ER for chest pain, reportedly resolved at this time.  Placed under 1013.  EKG with no obvious ischemic changes, no evidence of arrhythmia with normal intervals.  Vital signs within normal limits.  Clinical examination otherwise grossly reassuring.  Labs with normal white blood cell count, stable anemia, grossly normal electrolytes and kidney function, negative troponin, normal urine.  Acetaminophen and salicylate level negative, alcohol level normal an UDS positive for cannabinoids.  Chest x-ray unremarkable, x-ray right shoulder unremarkable.  Discussed with  poison control.  Patient refusing any further interventions with capacity for decision making.  No further ongoing medical complaints and subsequently medically cleared.  Awaiting behavioral health evaluation for likely psychiatric placement for further ongoing psychiatric care.  Remained otherwise hemodynamically stable throughout ER course, requesting something to eat.  Psychiatric risk moderate to severe.      Amount and/or Complexity of Data Reviewed  Clinical lab tests: ordered and reviewed  Tests in the radiology section of CPT: ordered and reviewed  Decide to obtain previous medical records or to obtain history from someone other than the patient: yes  Obtain history from someone other than the patient: yes  Review and summarize past medical records: yes  Discuss the patient with other providers: yes  Independent visualization of images, tracings, or specimens: yes    Risk of Complications, Morbidity, and/or Mortality  Presenting problems: high  Diagnostic procedures: high  Management options: high    Patient Progress  Patient progress: stable    ED CONSULT    None      ED REEVALUATION    None      Clinical Impression:  Diagnoses     Diagnosis Comment Added By Time Added    Suicidal ideations  Eino Farber, MD 12/12/2020 20:29  Right shoulder pain, unspecified chronicity  Eino Farber, MD 12/12/2020 20:29    Chest pain, unspecified type  Eino Farber, MD 12/12/2020 20:29    Intentional acetaminophen poisoning, initial encounter (CMS-HCC)  Eino Farber, MD 12/13/2020  0:14       Disposition:   Was stabilized & transferred        Eino Farber, MD  12/13/20 0014    Electronically signed by Eino Farber, MD at 12/13/2020  0:14 EDT

## 2020-12-12 NOTE — ED Provider Notes (Signed)
 Formatting of this note might be different from the original.  Psychiatric Evaluation Update: Re-evaluation of psychiatric patient. Patient was observed in the psych holding area by myself.  Patient is well appearing and in no apparent distress at this time. No new significant findings on physical examination. Patient currently still awaiting psychiatric placement.  No issues as reported by the psych nurse present.  The clinical status of this patient has not changed and is to remain at moderate risk.      Miguel Carr  12/12/20 2259      Miguel Carr  12/13/20 0019    Cosigned by Eino Farber, MD at 12/13/2020  0:19 EDT  Electronically signed by Miguel Sane, PA at 12/12/2020 22:59 EDT  Electronically signed by Eino Farber, MD at 12/13/2020  0:19 EDT

## 2020-12-13 NOTE — Unmapped (Signed)
 Formatting of this note might be different from the original.  [facility] 12/13/2020 00:07  Yes Menu   Called [facility] ED and spoke with Tiffany RN who states she will follow up with Dr. Kyung Rudd regarding the new ED provider note. As the current note in the chart is from when the pt came to the ED and was discharged earlier in the morning. I informed Tiffany [facility] needs the provider note mentioning the pt was in for a tylenol overdose and is now on a 1013. She states she will follow up with the ED MD.  Electronically signed by Bjorn Loser, LCSW at 12/13/2020  0:07 EDT
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
## 2020-12-13 NOTE — Nursing Note (Signed)
 Formatting of this note might be different from the original.  Pt.'s GCAL status is Red due to low diastolic BP (55). packet sent to [facility] for MD review.    Electronically signed by Vincent Peyer at 12/13/2020  5:08 EDT

## 2020-12-13 NOTE — Nursing Note (Signed)
 Formatting of this note might be different from the original.  [facility] Update: PT's GCAL updated and he is now GREEN.  PS will fax referral to [facility].   Electronically signed by Barbaraann Boys, LCSW at 12/13/2020  7:27 EDT

## 2020-12-13 NOTE — Nursing Note (Signed)
 Formatting of this note might be different from the original.  H&P completed. Starting GCAL.  Electronically signed by Vincent Peyer at 12/13/2020  4:40 EDT

## 2020-12-13 NOTE — ED Notes (Signed)
 Formatting of this note might be different from the original.  Pt. Requested and was provided multiple sandwiches, multiple beverages, and crackers.  Pt. Denies complaint at this time.  No apparent distress. Continuing to monitor.  Electronically signed by Danton Sewer, RN at 12/13/2020 23:13 EDT

## 2020-12-13 NOTE — ED Provider Notes (Signed)
 Formatting of this note might be different from the original.  Psychiatric Evaluation Update: Re-evaluation of psychiatric patient. Patient was observed in the psych holding area by myself.  Patient is well appearing and in no apparent distress at this time. No new significant findings on physical examination. Patient currently still awaiting psychiatric placement.  No issues as reported by the psych nurse present.    Overall level suicide risk: Moderate    Posey Rea, [place]  12/13/20 2103      Posey Rea, PA  12/14/20 1242    Cosigned by Eino Farber, MD at 12/14/2020 12:42 EDT  Electronically signed by Posey Rea, PA at 12/13/2020 21:03 EDT  Electronically signed by Eino Farber, MD at 12/14/2020 12:42 EDT

## 2020-12-13 NOTE — ED Notes (Signed)
 Formatting of this note might be different from the original.  Writer spoke with pt. nurse Freida Busman regarding speaking with Dr. Kyung Rudd about BHA and recommendation. RN Freida Busman stated that Dr. Kyung Rudd is not there and provided writer with a number to call:  [phone]     Electronically signed by Janine Limbo, LCSW at 12/13/2020 11:14 EDT
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
## 2020-12-13 NOTE — ED Notes (Signed)
 Formatting of this note might be different from the original.  Patient is upset and says he just needs outpatient psych. That he took the tylenol for his shoulder pain.  Electronically signed by Seabron Spates, RN at 12/13/2020  5:35 EDT

## 2020-12-13 NOTE — Nursing Note (Signed)
 Formatting of this note might be different from the original.  ARC Placement Update:    [facility] - Declined, At [facility] - Declined, At Capacity  [facility] - Declined, At [facility] - Declined, At Capacity  [facility] - Declined, At Capacity    Reg. 5 CSUs exhausted for tonight.     Electronically signed by Judithann Graves at 12/13/2020 22:08 EDT

## 2020-12-13 NOTE — ED Notes (Signed)
 Formatting of this note might be different from the original.  Called provided number to speak with pt. Doctor twice but no answer  Electronically signed by Janine Limbo, LCSW at 12/13/2020 11:15 EDT

## 2020-12-13 NOTE — ED Notes (Signed)
 Formatting of this note might be different from the original.  Patient is having explosive anger and yelling at staff. Wants to know when his going to be discharged and is mad because he cant go.  Electronically signed by Seabron Spates, RN at 12/13/2020  6:42 EDT
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
## 2020-12-13 NOTE — ED Provider Notes (Signed)
 Formatting of this note might be different from the original.  Psychiatric Evaluation Update: Re-evaluation of psychiatric patient. Patient was observed in the psych holding area by myself.  Patient is well appearing and in no apparent distress at this time. No new significant findings on physical examination. Patient currently still awaiting psychiatric placement.  No issues as reported by the psych nurse present.    Overall level suicide risk: moderate risk       Hoover Browns, NP  12/13/20 1610      Hoover Browns, NP  12/13/20 1459    Cosigned by Eino Farber, MD at 12/13/2020 14:59 EDT  Electronically signed by Hoover Browns, NP at 12/13/2020  8:36 EDT  Electronically signed by Eino Farber, MD at 12/13/2020 14:59 EDT
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
## 2020-12-13 NOTE — ED Notes (Signed)
 Formatting of this note might be different from the original.  Patient denies SI, denies HI and denies AVH. Patient has been laying in bed no complaints.  Electronically signed by Seabron Spates, RN at 12/13/2020  3:28 EDT
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
## 2020-12-14 NOTE — Progress Notes (Signed)
 Formatting of this note is different from the original.  Psychiatric Progress Note    Name Miguel Carr Age/Sex 50 y.o./male   Dept [facility] DOB 1970/04/26   PCP No Primary Or Family Physician MRN [id number]   Note Entered by Harland German LOS 0 days     Subjective   Subjective:  CC:  "  This was a misunderstanding"  Patient reports:  Patient reviews the events that brought him into the hospital including an overdose of Tylenol.  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  Patient reports that he is not experiencing any auditory visual hallucinations.  He denies any suicidal ideation /plan / intent.  Patient denies adamantly that this was a suicide attempt despite earlier reports.  Patient states that he does exceed the recommended dose of Tylenol on occasion despite knowing that it could be potentially harmful to his liver, stating that he feels this is better than taking stronger opiates for his shoulder pain.  Patient denies any paranoid ideation or other psychotic symptoms.  Patient states he does not take any psychotropic medications nor does he want to.  Patient reports no ongoing psychiatric care.  Patient reports positive homicidal ideation towards his father, but he reports his father is currently in prison for life in a [place].  Patient is demanding to be discharged.  Present time, patient is able to report plans for the future, goals for the future, ability to manage and care for himself in the homeless community including finding shelter and food.  Patient denying auditory /visual / tactile hallucinations, suicidal ideation/plan / intent.  Patient speech appears to be linear and goal-directed.    ADL'S: independent fair  Participation in treatment plan: yes  Participation in group therapy:          Participation in discharge plan: yes  Compliance of recommendations: No  Detox Issues: not applicable  Depressed: No  Psychotic:  No  Hallucinations: Denies  Delusions: none        Objective   Objective:  Vital signs range for the last 24 hours:  Temp:  [36.8 C (98.2 F)-36.9 C (98.5 F)] 36.9 C (98.5 F) (09/30 0825)  Heart Rate:  [75-82] 75 (09/30 0825)  Resp:  [14-20] 20 (09/30 0825)  BP: (115-124)/(60-78) 115/78 (09/30 0825)    Pain Score: 0-No pain (12/14/20 0822)    O2 Device: Room air (12/14/20 0825)        Scheduled Meds:  PRN Meds:acetaminophen    Review of Symptoms  systems reviewed and negative for all systems    Mental Status:  Level of Alertness: alert  Orientation: awake, alert, oriented X4  Appearance: appropriate  Mood: irritable  Affect: congruent with thought content  Psychomotor Status: restless  Attitude: open  SI/HI: homicidal ideation  Sleep Concerns: no sleep concerns  Speech Concerns: normal  Language: articulate  Thought Processes: coherent and goal directed  Associations: intact  Thought Content: within normal limit  Hallucinations: denies  Memory: intact  Attention: adequate                                           Concentration: adequate  Intellect: normal  Fund of knowledge: fair  Insight/Judgement: insight fair and judgement fair  Gait: normal gait and station  Cognitive Function: no change    Labs:  Lab Results   Component Value Date    WBC 6.5 12/12/2020    HGB 12.4 (L) 12/12/2020    HCT 37.8 (L) 12/12/2020    MCV 76.8 (L) 12/12/2020    PLT 326 12/12/2020     Lab Results   Component Value Date/Time    NA 136.74 (L) 12/12/2020 16:02    K 3.9 12/12/2020 16:02    CL 102.98 12/12/2020 16:02    CO2 22 12/12/2020 16:02    ANIONGAP 16 12/12/2020 16:02    GLU 85.01 12/12/2020 16:02    BUN 14 12/12/2020 16:02    CREATININE 0.63 12/12/2020 16:02    CALCIUM 9.0 12/12/2020 16:02     No results found for: TSH, T3TOTAL, T4TOTAL, THYROIDAB  No results found for: VITAMINB12  No results found for: PHENYTOIN, PHENOBARB, VALPROATE, CBMZ  Lab Results   Component Value Date    NA 136.74  (L) 12/12/2020    BUN 14 12/12/2020    CREATININE 0.63 12/12/2020    WBC 6.5 12/12/2020     No results found for: HGBA1C  No results found for: CHOL  No results found for: HDL  No results found for: LDLCALC  No results found for: TRIG  No results found for: CHOLHDL    Imaging:  No results found.    Results: vital signs        Assessment/Plan:    Active Hospital Problems:    * No active hospital problems. *    Legal Status: involuntary    Assessment   Plan:      Patient at this time does not appear to be imminently dangerous to himself.  He represents a longer-term risk given his history, his desire for no treatment but does not appear to be incapacitated in terms of medical decision making.  Patient's substance use also places him at higher risk, his history of impulsivity and intermittent explosive disorder as well.  Patient is expressing a desire to live, plans for the future, and ability to manage himself in the homeless community including the ability to find food and shelter, knowledge and preparedness for ankle met whether and managing himself outside, denying suicidal ideation /plan / intent, denying auditory/ visual / tactile hallucinations, denying active paranoid ideation.  Do not believe patient would benefit from forced hospitalization, but can not provide information regarding outpatient groups and opportunities to address anger management.  Will discontinue the involuntary status and patient may be discharged from a psychiatric standpoint to follow up with outpatient therapy to address anger management.        DISCLAIMER: This chart was created using M-Modal dictation software. Efforts were made to ensure accuracy, however some errors may be present due to limitations of this technology and occasionally words are not transcribed as intended.      Harland German, MD  12/14/20 [phone]    Electronically signed by Harland German, MD at 12/14/2020  9:05 EDT

## 2020-12-14 NOTE — ED Notes (Signed)
 Formatting of this note might be different from the original.  Pt. Resting comfortably at this time.  Respirations even and non-labored.  No apparent distress.  Continuing to monitor.  Electronically signed by Danton Sewer, RN at 12/14/2020  3:13 EDT

## 2020-12-14 NOTE — Unmapped (Signed)
 Formatting of this note might be different from the original.  Arc closing chart due to DC 1013  Electronically signed by Saunders Glance at 12/14/2020  9:26 EDT

## 2020-12-14 NOTE — ED Notes (Signed)
 Formatting of this note might be different from the original.  Pt. Complaining of headache and was provided PRN Tylenol as ordered.  Pt. Requested and was provided orange juice.  Pt. Lying in bed watching television at this time.  No apparent distress.  Continuing to monitor.  Electronically signed by Danton Sewer, RN at 12/14/2020  1:23 EDT

## 2020-12-14 NOTE — Assessment & Plan Note (Signed)
 Formatting of this note might be different from the original.  Patient may be discharged from a psych standpoint, full note to follow    Will DC 1013    Harland German, MD  12/14/20 [phone]    Electronically signed by Harland German, MD at 12/14/2020  8:18 EDT

## 2020-12-14 NOTE — ED Notes (Signed)
 Formatting of this note might be different from the original.  Pt. Complaining of RIGHT shoulder pain.  Spoke w/ Salley Slaughter, MD who gave verbal order for Ibuprofen.  Medication administered as ordered.    Electronically signed by Danton Sewer, RN at 12/14/2020  5:07 EDT

## 2021-01-04 LAB — PROTHROMBIN TIME CARE EVERYWHERE
INR CARE EVERYWHERE: 1
PROTHROMBIN TIME CARE EVERYWHERE: 13.3 s (ref 12.1–15.0)

## 2021-01-04 LAB — COVID-19 CARE EVERYWHERE: COVID-19 CARE EVERYWHERE: NOT DETECTED

## 2021-01-04 LAB — APTT CARE EVERYWHERE: APTT CARE EVERYWHERE: 30 s (ref 25–38)

## 2021-01-04 LAB — LAB EXTERNAL RESULT UNMAPPED: ESTIMATED GFR CARE EVERYWHERE: 120 (ref >=60)

## 2021-01-08 LAB — PROTHROMBIN TIME CARE EVERYWHERE
INR CARE EVERYWHERE: 1.03 {INR} (ref 0.82–1.13)
PROTHROMBIN TIME CARE EVERYWHERE: 11.9 s (ref 9.4–13.0)

## 2021-01-09 LAB — BMP (EXT)
Anion Gap (EXT): 12 mmol/L (ref 6–14)
BUN (EXT): 19 mg/dL (ref 10–26)
CO2 (EXT): 21 mmol/L (ref 21–30)
CalciumCalcium (EXT): 9.4 mg/dL (ref 8.5–10.3)
Chloride (EXT): 106 mmol/L (ref 89–109)
Creatinine (EXT): 0.57 mg/dL — ABNORMAL LOW (ref 0.70–1.40)
Glucose (EXT): 102 mg/dL — ABNORMAL HIGH (ref 70–100)
Potassium (EXT): 4.8 mmol/L (ref 3.5–5.0)
Sodium (EXT): 139 mmol/L (ref 135–146)
eGFR - Creat CKD-EPI (EXT): 120 mL/min/1.73m (ref >=60)

## 2021-01-09 LAB — ABO/RH CARE EVERYWHERE: ABO/RH CARE EVERYWHERE: A POS

## 2021-01-09 LAB — TYPE AND SCREEN CARE EVERYWHERE
ANTIBODY SCREEN CARE EVERYWHERE: NEGATIVE
BLOOD TYPE AUTOMATION CARE EVERYWHERE: A POS

## 2021-01-14 ENCOUNTER — Emergency Department: Admit: 2021-01-14 | Payer: PRIVATE HEALTH INSURANCE

## 2021-01-14 ENCOUNTER — Inpatient Hospital Stay: Admit: 2021-01-14 | Discharge: 2021-01-15 | Payer: PRIVATE HEALTH INSURANCE | Attending: Emergency Medicine

## 2021-01-14 DIAGNOSIS — R079 Chest pain, unspecified: Secondary | ICD-10-CM

## 2021-01-14 LAB — PROTHROMBIN TIME CARE EVERYWHERE
INR CARE EVERYWHERE: 1.04 (ref 0.92–1.19)
PROTHROMBIN TIME CARE EVERYWHERE: 10.8 s (ref 9.6–12.3)

## 2021-01-14 LAB — COVID-19 CARE EVERYWHERE: COVID-19 CARE EVERYWHERE: NEGATIVE

## 2021-01-14 MED ORDER — MORPHINE 4 MG/ML INTRAVENOUS SOLUTION
4 mg/mL | Freq: Once | INTRAVENOUS | Status: CP
Start: 2021-01-14 — End: ?
  Administered 2021-01-15: 03:00:00 4 mL via INTRAVENOUS

## 2021-01-15 ENCOUNTER — Inpatient Hospital Stay: Admit: 2021-01-15 | Discharge: 2021-01-16 | Payer: PRIVATE HEALTH INSURANCE | Attending: Emergency Medicine

## 2021-01-15 ENCOUNTER — Encounter: Admit: 2021-01-15 | Payer: PRIVATE HEALTH INSURANCE | Attending: Psychiatry

## 2021-01-15 DIAGNOSIS — I252 Old myocardial infarction: Secondary | ICD-10-CM

## 2021-01-15 DIAGNOSIS — F4310 Post-traumatic stress disorder, unspecified: Secondary | ICD-10-CM

## 2021-01-15 DIAGNOSIS — I219 Acute myocardial infarction, unspecified: Secondary | ICD-10-CM

## 2021-01-15 DIAGNOSIS — I2510 Atherosclerotic heart disease of native coronary artery without angina pectoris: Secondary | ICD-10-CM

## 2021-01-15 DIAGNOSIS — Z79899 Other long term (current) drug therapy: Secondary | ICD-10-CM

## 2021-01-15 DIAGNOSIS — F17200 Nicotine dependence, unspecified, uncomplicated: Secondary | ICD-10-CM

## 2021-01-15 DIAGNOSIS — I10 Essential (primary) hypertension: Secondary | ICD-10-CM

## 2021-01-15 DIAGNOSIS — Z7982 Long term (current) use of aspirin: Secondary | ICD-10-CM

## 2021-01-15 DIAGNOSIS — Z20822 Contact with and (suspected) exposure to covid-19: Secondary | ICD-10-CM

## 2021-01-15 DIAGNOSIS — S069XAA TBI (traumatic brain injury) (HC Code): Secondary | ICD-10-CM

## 2021-01-15 DIAGNOSIS — Z91048 Other nonmedicinal substance allergy status: Secondary | ICD-10-CM

## 2021-01-15 DIAGNOSIS — R456 Violent behavior: Secondary | ICD-10-CM

## 2021-01-15 DIAGNOSIS — Z888 Allergy status to other drugs, medicaments and biological substances status: Secondary | ICD-10-CM

## 2021-01-15 DIAGNOSIS — Z635 Disruption of family by separation and divorce: Secondary | ICD-10-CM

## 2021-01-15 DIAGNOSIS — R0789 Other chest pain: Secondary | ICD-10-CM

## 2021-01-15 DIAGNOSIS — R45851 Suicidal ideations: Secondary | ICD-10-CM

## 2021-01-15 DIAGNOSIS — Z886 Allergy status to analgesic agent status: Secondary | ICD-10-CM

## 2021-01-15 DIAGNOSIS — F29 Unspecified psychosis not due to a substance or known physiological condition: Secondary | ICD-10-CM

## 2021-01-15 DIAGNOSIS — Z87820 Personal history of traumatic brain injury: Secondary | ICD-10-CM

## 2021-01-15 LAB — BMP (EXT)
Anion Gap (EXT): 9 (ref 7–17)
BUN (EXT): 15 mg/dL (ref 6–20)
BUN/CREAT Ratio (EXT): 25 — ABNORMAL HIGH (ref 8.0–23.0)
CO2 (EXT): 24 mmol/L (ref 20–30)
CalciumCalcium (EXT): 8.8 mg/dL (ref 8.8–10.2)
Chloride (EXT): 107 mmol/L (ref 98–107)
Creatinine (EXT): 0.6 mg/dL (ref 0.40–1.30)
Glucose (EXT): 99 mg/dL (ref 70–100)
Potassium (EXT): 4.3 mmol/L (ref 3.3–5.3)
Sodium (EXT): 140 mmol/L (ref 136–144)
eGFR - Creat CKD-EPI (EXT): >60 mL/min/1.73m2 (ref >=60)

## 2021-01-15 LAB — CBC WITH AUTO DIFFERENTIAL
BKR WAM ABSOLUTE IMMATURE GRANULOCYTES.: 0.01 x 1000/ÂµL (ref 0.00–0.30)
BKR WAM ABSOLUTE LYMPHOCYTE COUNT.: 1.31 x 1000/ÂµL (ref 0.60–3.70)
BKR WAM ABSOLUTE NRBC (2 DEC): 0 x 1000/ÂµL (ref 0.00–1.00)
BKR WAM ANALYZER ANC: 2.94 x 1000/ÂµL (ref 2.00–7.60)
BKR WAM BASOPHIL ABSOLUTE COUNT.: 0.01 x 1000/ÂµL (ref 0.00–1.00)
BKR WAM BASOPHILS: 0.2 % (ref 0.0–1.4)
BKR WAM EOSINOPHIL ABSOLUTE COUNT.: 0.14 x 1000/ÂµL (ref 0.00–1.00)
BKR WAM EOSINOPHILS: 2.7 % (ref 0.0–5.0)
BKR WAM HEMATOCRIT (2 DEC): 36.1 % — ABNORMAL LOW (ref 38.50–50.00)
BKR WAM HEMOGLOBIN: 11.5 g/dL — ABNORMAL LOW (ref 13.2–17.1)
BKR WAM IMMATURE GRANULOCYTES: 0.2 % (ref 0.0–1.0)
BKR WAM LYMPHOCYTES: 25.7 % (ref 17.0–50.0)
BKR WAM MCH (PG): 25.3 pg — ABNORMAL LOW (ref 27.0–33.0)
BKR WAM MCHC: 31.9 g/dL (ref 31.0–36.0)
BKR WAM MCV: 79.5 fL — ABNORMAL LOW (ref 80.0–100.0)
BKR WAM MONOCYTE ABSOLUTE COUNT.: 0.69 x 1000/ÂµL (ref 0.00–1.00)
BKR WAM MONOCYTES: 13.5 % — ABNORMAL HIGH (ref 4.0–12.0)
BKR WAM MPV: 8.5 fL (ref 8.0–12.0)
BKR WAM NEUTROPHILS: 57.7 % (ref 39.0–72.0)
BKR WAM NUCLEATED RED BLOOD CELLS: 0 % (ref 0.0–1.0)
BKR WAM PLATELETS: 331 x1000/ÂµL (ref 150–420)
BKR WAM RDW-CV: 18.2 % — ABNORMAL HIGH (ref 11.0–15.0)
BKR WAM RED BLOOD CELL COUNT.: 4.54 M/ÂµL (ref 4.00–6.00)
BKR WAM WHITE BLOOD CELL COUNT: 5.1 x1000/ÂµL (ref 4.0–11.0)

## 2021-01-15 LAB — BASIC METABOLIC PANEL
BKR ANION GAP: 9 (ref 7–17)
BKR BLOOD UREA NITROGEN: 15 mg/dL (ref 6–20)
BKR BUN / CREAT RATIO: 25 — ABNORMAL HIGH (ref 8.0–23.0)
BKR CALCIUM: 8.8 mg/dL (ref 8.8–10.2)
BKR CHLORIDE: 107 mmol/L (ref 98–107)
BKR CO2: 24 mmol/L (ref 20–30)
BKR CREATININE: 0.6 mg/dL (ref 0.40–1.30)
BKR EGFR, CREATININE (CKD-EPI 2021): >60 mL/min/{1.73_m2} (ref >=60)
BKR GLUCOSE: 99 mg/dL (ref 70–100)
BKR POTASSIUM: 4.3 mmol/L (ref 3.3–5.3)
BKR SODIUM: 140 mmol/L (ref 136–144)

## 2021-01-15 LAB — HEPATIC FUNCTION PANEL
BKR A/G RATIO: 2.2 (ref 1.0–2.2)
BKR ALANINE AMINOTRANSFERASE (ALT): 38 U/L (ref 9–59)
BKR ALBUMIN: 4.2 g/dL (ref 3.6–4.9)
BKR ALKALINE PHOSPHATASE: 113 U/L (ref 9–122)
BKR ASPARTATE AMINOTRANSFERASE (AST): 27 U/L (ref 10–35)
BKR AST/ALT RATIO: 0.7
BKR BILIRUBIN DIRECT: <0.2 mg/dL (ref <=0.3)
BKR BILIRUBIN TOTAL: <0.2 mg/dL (ref <=1.2)
BKR GLOBULIN: 1.9 g/dL — ABNORMAL LOW (ref 2.3–3.5)
BKR PROTEIN TOTAL: 6.1 g/dL — ABNORMAL LOW (ref 6.6–8.7)

## 2021-01-15 LAB — PROTIME AND INR
BKR INR: 1.04 (ref 0.92–1.19)
BKR PROTHROMBIN TIME: 10.8 s (ref 9.6–12.3)

## 2021-01-15 LAB — TROPONIN T HIGH SENSITIVITY, 0 HOUR BASELINE WITH REFLEX (BH GH LMW YH): BKR TROPONIN T HS 0 HOUR BASELINE: 10 ng/L

## 2021-01-15 LAB — TROPONIN T HIGH SENSITIVITY, 1 HOUR WITH REFLEX (BH GH LMW YH)
BKR TROPONIN T HS 1 HOUR DELTA FROM 0 HOUR: -1 ng/L
BKR TROPONIN T HS 1 HOUR: 9 ng/L

## 2021-01-15 LAB — SARS COV-2 (COVID-19) RNA: BKR SARS-COV-2 RNA (COVID-19) (YH): NEGATIVE

## 2021-01-15 MED ORDER — HALOPERIDOL 5 MG TABLET
5 mg | ORAL | Status: DC | PRN
Start: 2021-01-15 — End: 2021-01-16

## 2021-01-15 MED ORDER — HALOPERIDOL LACTATE 5 MG/ML INJECTION SOLUTION
5 mg/mL | INTRAMUSCULAR | Status: DC | PRN
Start: 2021-01-15 — End: 2021-01-16

## 2021-01-15 MED ORDER — MIDAZOLAM (PF) 5 MG/ML INJECTION SOLUTION
5 mg/mL | Freq: Four times a day (QID) | INTRAMUSCULAR | Status: DC | PRN
Start: 2021-01-15 — End: 2021-01-16

## 2021-01-15 MED ORDER — DIPHENHYDRAMINE 50 MG/ML INJECTION (WRAPPED E-RX)
50 mg/mL | Freq: Four times a day (QID) | INTRAMUSCULAR | Status: DC | PRN
Start: 2021-01-15 — End: 2021-01-16

## 2021-01-15 MED ORDER — MORPHINE 4 MG/ML INTRAVENOUS SOLUTION
4 mg/mL | Freq: Once | INTRAVENOUS | Status: CP
Start: 2021-01-15 — End: ?
  Administered 2021-01-15: 06:00:00 4 mL via INTRAVENOUS

## 2021-01-15 MED ORDER — DIVALPROEX 500 MG TABLET,DELAYED RELEASE
500 mg | Freq: Two times a day (BID) | ORAL | Status: DC
Start: 2021-01-15 — End: 2021-01-16
  Administered 2021-01-15 – 2021-01-16 (×2): 500 mg via ORAL

## 2021-01-15 MED ORDER — GABAPENTIN 300 MG CAPSULE
300 mg | Freq: Three times a day (TID) | ORAL | Status: DC | PRN
Start: 2021-01-15 — End: 2021-01-16

## 2021-01-15 NOTE — Utilization Review (ED)
UM Status: Meets Observation Status Patient admitted to the [facility] for CP evaluation. Plan for stress test
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
## 2021-01-15 NOTE — Discharge Instructions
You were seen at [facility] Emergency room on 01/15/2021  Based on my assessment you most likely have chest pain and no heart attack.No adjustments were made to your home medication regimen.Please call your primary care doctor's office as soon as possible to schedule an appointment for follow up on your ED visit.Please Return to ED if any of the following develop:- Worsening of your current symptoms- Any other symptoms that you find concerningThank you for trusting your care to [place].  Do not hesitate to return to the Emergency Department if you feel the need.
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
## 2021-01-15 NOTE — ED Notes
2:47 AM Patient transferred from AsideAppears agitated, states I want waterThis RN advised the patient he is NPO for stress testPatient increasingly agitated, stated They told me I can drinkI will go get it by myselfDo you know I have this psychotic disorder that comes to me without warning?Patient visibly upset and threatening to get up and go get the waterRefused assessment. EKG acquired, Patient unsafe to [facility] the [facility] as he threatened the RNRefusing care. Attending notified, Wilkie Aye charge RN notifiedPatient transferred back Aside for staff [phone] transferred back to [facility]

## 2021-01-15 NOTE — ED Notes
SOCIAL WORK NOTEPatient Name: John DegarmoMedical Record Number: [id number] Date of Birth: 1972-09-13Social Work Follow Up  [employer] Most Recent Value Admission Information  Document Type Progress Note (For Inpatient/ED Only) Prior psychosocial assessment has been documented within this hospitalization No (For Inpatient/ED Only) Prior psychosocial assessment has been documented within 30 days of this hospitalization No Reason for Current Social Work Land Restriction in Place No Intervention Assessment and referral to community resources  Assessment and Referral to [employer] Source of Information Patient Record Reviewed No Level of Care Emergency Department Identified Clinical/Disposition, Issues/Barriers: Lack resources.Need transportation Intervention(s)/Summary 0 minutes spent face to face with John Stout. 10 minutes spent  coordinating transportation with the registered nurse. I followed up by booking a ride with [employer] ([employer]) Dispatcher and cost was charged to the hospital account. No safety concerns were indicated and wearing a mask. Collaboration with Treatment Team/Community Providers/Family: Designer, jewellery.Taxi dispatcher Outcome Resolved Handoff Required? No Next Steps/Plan (including hand-off): Transportation provided to facilitate discharge. No additional social work intervention is required at [name] time. Signature: John Stout's LC SW Contact Information: [phone]

## 2021-01-16 ENCOUNTER — Inpatient Hospital Stay: Admit: 2021-01-16 | Discharge: 2021-01-16 | Payer: PRIVATE HEALTH INSURANCE | Attending: Emergency Medicine

## 2021-01-16 ENCOUNTER — Emergency Department: Admit: 2021-01-16 | Payer: PRIVATE HEALTH INSURANCE

## 2021-01-16 ENCOUNTER — Encounter: Admit: 2021-01-16 | Payer: PRIVATE HEALTH INSURANCE

## 2021-01-16 DIAGNOSIS — F29 Unspecified psychosis not due to a substance or known physiological condition: Secondary | ICD-10-CM

## 2021-01-16 DIAGNOSIS — E785 Hyperlipidemia, unspecified: Secondary | ICD-10-CM

## 2021-01-16 DIAGNOSIS — Z87820 Personal history of traumatic brain injury: Secondary | ICD-10-CM

## 2021-01-16 DIAGNOSIS — R200 Anesthesia of skin: Secondary | ICD-10-CM

## 2021-01-16 DIAGNOSIS — I2510 Atherosclerotic heart disease of native coronary artery without angina pectoris: Secondary | ICD-10-CM

## 2021-01-16 DIAGNOSIS — F4310 Post-traumatic stress disorder, unspecified: Secondary | ICD-10-CM

## 2021-01-16 DIAGNOSIS — Z888 Allergy status to other drugs, medicaments and biological substances status: Secondary | ICD-10-CM

## 2021-01-16 DIAGNOSIS — Z635 Disruption of family by separation and divorce: Secondary | ICD-10-CM

## 2021-01-16 DIAGNOSIS — Z79899 Other long term (current) drug therapy: Secondary | ICD-10-CM

## 2021-01-16 DIAGNOSIS — F17200 Nicotine dependence, unspecified, uncomplicated: Secondary | ICD-10-CM

## 2021-01-16 DIAGNOSIS — Z885 Allergy status to narcotic agent status: Secondary | ICD-10-CM

## 2021-01-16 DIAGNOSIS — I10 Essential (primary) hypertension: Secondary | ICD-10-CM

## 2021-01-16 DIAGNOSIS — I252 Old myocardial infarction: Secondary | ICD-10-CM

## 2021-01-16 DIAGNOSIS — R45851 Suicidal ideations: Secondary | ICD-10-CM

## 2021-01-16 DIAGNOSIS — R531 Weakness: Secondary | ICD-10-CM

## 2021-01-16 DIAGNOSIS — R456 Violent behavior: Secondary | ICD-10-CM

## 2021-01-16 DIAGNOSIS — Z7982 Long term (current) use of aspirin: Secondary | ICD-10-CM

## 2021-01-16 DIAGNOSIS — S069XAA TBI (traumatic brain injury) (HC Code): Secondary | ICD-10-CM

## 2021-01-16 DIAGNOSIS — I219 Acute myocardial infarction, unspecified: Secondary | ICD-10-CM

## 2021-01-16 LAB — BMP (EXT)
Anion Gap (EXT): 7 mmol/L (ref 5–15)
BUN (EXT): 10 mg/dL (ref 7–18)
CO2 (EXT): 25 mmol/L (ref 21–32)
CalciumCalcium (EXT): 8.9 mg/dL (ref 8.5–10.1)
Chloride (EXT): 108 mmol/L — ABNORMAL HIGH (ref 98–107)
Creatinine (EXT): 0.65 mg/dL — ABNORMAL LOW (ref 0.70–1.30)
Glucose (EXT): 93 mg/dL (ref 65–110)
Potassium (EXT): 4.1 mmol/L (ref 3.5–5.1)
Sodium (EXT): 140 mmol/L (ref 136–145)
eGFR - Creat CKD-EPI (EXT): >60 mL/min/1.73m2 (ref >=60)

## 2021-01-16 LAB — TYPE AND SCREEN CARE EVERYWHERE
ANTIBODY SCREEN CARE EVERYWHERE: NEGATIVE
RH TYPE CARE EVERYWHERE: POSITIVE

## 2021-01-16 LAB — APTT CARE EVERYWHERE: APTT CARE EVERYWHERE: 24.2 s (ref 21.0–32.0)

## 2021-01-16 LAB — PROTHROMBIN TIME CARE EVERYWHERE
INR CARE EVERYWHERE: 1.11 (ref 0.90–1.16)
PROTHROMBIN TIME CARE EVERYWHERE: 10.9 s (ref 9.0–11.1)

## 2021-01-16 LAB — CBC WITHOUT DIFFERENTIAL
BKR WAM HEMATOCRIT (2 DEC): 39.6 % (ref 38.50–50.00)
BKR WAM HEMOGLOBIN: 12.9 g/dL — ABNORMAL LOW (ref 13.2–17.1)
BKR WAM MCH PG: 25.9 pg — ABNORMAL LOW (ref 27.0–33.0)
BKR WAM MCHC: 32.6 g/dL (ref 31.0–36.0)
BKR WAM MCV: 79.4 fL — ABNORMAL LOW (ref 80.0–100.0)
BKR WAM MPV: 9.3 fL (ref 8.0–12.0)
BKR WAM PLATELETS: 347 x1000/ÂµL (ref 150–420)
BKR WAM RDW-CV: 17.9 % — ABNORMAL HIGH (ref 11.0–15.0)
BKR WAM RED BLOOD CELL COUNT.: 4.99 M/ÂµL (ref 4.00–6.00)
BKR WAM WHITE BLOOD CELL COUNT: 8.5 x1000/ÂµL (ref 4.0–11.0)

## 2021-01-16 LAB — BASIC METABOLIC PANEL
BKR ANION GAP (LM): 7 mmol/L (ref 5–15)
BKR BLOOD UREA NITROGEN: 10 mg/dL (ref 7–18)
BKR CALCIUM: 8.9 mg/dL (ref 8.5–10.1)
BKR CHLORIDE: 108 mmol/L — ABNORMAL HIGH (ref 98–107)
BKR CO2: 25 mmol/L (ref 21–32)
BKR CREATININE: 0.65 mg/dL — ABNORMAL LOW (ref 0.70–1.30)
BKR EGFR, CREATININE (CKD-EPI 2021): >60 mL/min/{1.73_m2} (ref >=60)
BKR GLUCOSE: 93 mg/dL (ref 65–110)
BKR POTASSIUM: 4.1 mmol/L (ref 3.5–5.1)
BKR SODIUM: 140 mmol/L (ref 136–145)

## 2021-01-16 LAB — PROTIME AND INR
BKR INR: 1.11 (ref 0.90–1.16)
BKR PROTHROMBIN TIME: 10.9 s (ref 9.0–11.1)

## 2021-01-16 LAB — ETHANOL     (BH GH L LMW YH): BKR ETHANOL (LM WH): <0.01 g/dL (ref <0.01)

## 2021-01-16 LAB — PARTIAL THROMBOPLASTIN TIME     (BH GH LMW Q YH): BKR PARTIAL THROMBOPLASTIN TIME: 24.2 s (ref 21.0–32.0)

## 2021-01-16 LAB — TROPONIN T HIGH SENSITIVITY, 0 HOUR BASELINE WITH REFLEX (BH GH LMW YH): BKR TROPONIN T HS 0 HOUR BASELINE: <6 ng/L

## 2021-01-16 MED ORDER — IOHEXOL 350 MG IODINE/ML INTRAVENOUS SOLUTION
350 mg iodine/mL | Freq: Once | INTRAVENOUS | Status: CP | PRN
Start: 2021-01-16 — End: ?
  Administered 2021-01-16: 16:00:00 350 mL via INTRAVENOUS

## 2021-01-16 MED ORDER — PROCHLORPERAZINE EDISYLATE 10 MG/2 ML (5 MG/ML) INJECTION SOLUTION
10 mg/2 mL (5 mg/mL) | Freq: Three times a day (TID) | INTRAVENOUS | Status: DC | PRN
Start: 2021-01-16 — End: 2021-01-17

## 2021-01-16 MED ORDER — DIPHENHYDRAMINE 50 MG/ML INJECTION (WRAPPED E-RX)
50 mg/mL | Freq: Three times a day (TID) | INTRAVENOUS | Status: DC | PRN
Start: 2021-01-16 — End: 2021-01-17

## 2021-01-16 MED ORDER — MAGNESIUM SULFATE 2 GRAM/50 ML (4 %) IN WATER INTRAVENOUS PIGGYBACK
2 gram/50 mL (4 %) | Freq: Once | INTRAVENOUS | Status: CP
Start: 2021-01-16 — End: ?
  Administered 2021-01-16: 16:00:00 2 mL/h via INTRAVENOUS

## 2021-01-16 MED ORDER — PROCHLORPERAZINE EDISYLATE 10 MG/2 ML (5 MG/ML) INJECTION SOLUTION
10 mg/2 mL (5 mg/mL) | Freq: Once | INTRAVENOUS | Status: DC
Start: 2021-01-16 — End: 2021-01-17

## 2021-01-16 MED ORDER — SODIUM CHLORIDE 0.45 % INTRAVENOUS SOLUTION
0.45 % | INTRAVENOUS | Status: DC
Start: 2021-01-16 — End: 2021-01-17
  Administered 2021-01-16: 16:00:00 0.45 mL/h via INTRAVENOUS

## 2021-01-16 MED ORDER — SODIUM CHLORIDE 0.9 % (FLUSH) INJECTION SYRINGE
0.9 % | INTRAVENOUS | Status: DC | PRN
Start: 2021-01-16 — End: 2021-01-17

## 2021-01-16 MED ORDER — METOCLOPRAMIDE 5 MG/ML INJECTION SOLUTION
5 mg/mL | Freq: Once | INTRAVENOUS | Status: DC
Start: 2021-01-16 — End: 2021-01-16

## 2021-01-16 MED ORDER — DIPHENHYDRAMINE 50 MG/ML INJECTION (WRAPPED E-RX)
50 mg/mL | Freq: Once | INTRAVENOUS | Status: DC
Start: 2021-01-16 — End: 2021-01-17

## 2021-01-16 MED ORDER — SODIUM CHLORIDE 0.9 % BOLUS (NEW BAG)
0.9 % | Freq: Once | INTRAVENOUS | Status: CP
Start: 2021-01-16 — End: ?
  Administered 2021-01-16: 16:00:00 0.9 mL/h via INTRAVENOUS

## 2021-01-16 NOTE — ED Notes
12:41 AM Rpt received from [place], [place]. Pt in bed resting calm and cooperative with even and unlabored respirations. Will continue to monitor pt and his safety.
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
## 2021-01-16 NOTE — Care Coordination-Inpatient
Amtrak ticket obtained from Geneticist, molecular for transportation to [place], per patient [employer] Pt provided cab voucher to [employer] in [place].Terie Purser, BSN, RN [facility] ED Case [phone]

## 2021-01-16 NOTE — Discharge Instructions
Return to the emergency department for any significant worsening of your symptoms or any other new or concerning symptoms.Follow-up with your primary care provider within 2-3 days for repeat evaluation and additional recommendations.
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
## 2021-01-16 NOTE — ED Notes
2:46 AMReport received from  [place], Charity fundraiser.  Patient is awake, laying in bed. Observed to be watching television.  Frequent rounding for safety and comfort. Support services provided as needed. Will continue with current plan of care. 3:44 AM MD notified via secure chat: this patient is requesting to be discharged4:29 AM Patient returned to RN station and is requesting AMA paperwork to leave. 5:02 AM Patient approached RN station to again request AMA paperwork to leave 6:14 AM Patient approached RN station to again request AMA paperwork to leave, requesting MD at beside to discuss his side of the discharge.  Armstrong MD notified via secure chat  6:38 AM Patient evaluated by provider and cleared for discharge.  Patient is not at imminent risk of harm to self or others. Discharge instructions provided.  Patient verbalized understanding.  All patients belongings and valuables returned.  Transportation provided by Self.
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
## 2021-01-16 NOTE — Discharge Instructions
You should arrange follow-up for further outpatient mental health treatment here or wherever you are heading to.  You may call 211 few need help finding housing or mental health treatment.Return if you have any new concerns.

## 2021-01-16 NOTE — ED Provider Notes
CHIEF COMPLAINTReported hallucinations, suicidalHPIMark Stout is a 50 y.o. male hypertension, hyperlipidemia, chronic coronary artery disease, bipolar disorder, PTSD, and psychosis  who presents to the ED c/o reported auditory hallucinations also feeling suicidal.  States that he is ?hearing voices? that were telling him to harm himself.  Says that he has chronically had auditory hallucinations ?since he was a kid? and says that for the past several weeks he has been ?fighting off the voices. ?  So that yesterday he was feeling suicidal and considered ?drinking entire bottle of Tylenol and slitting his wrists. ?  Patient does report that he was seen yesterday at [facility] however says that he did not talk about his ?mental issues? was there because of chronic chest pain, says that he had testing done and they told him ?everything was fine. ? He does not complain of any chest pain today.  He denies any recent illness including fevers, chills.  He is not had any nausea or vomiting.  No diarrhea.  No known sick contacts.  Reports that he does smoke marijuana on a regular basis however denies any other illicit drug use.  No recent alcohol use.PMH/PSH/FMH/Social Hx, medications and allergies are reviewed as documented in nursing notes.Allergies Allergen Reactions ? Toradol [Ketorolac] Hives   & vomiting per patient 2014.09.26 SP ? Tramadol Hives   & vomiting per patient 2014.09.26 SP ? Reglan [Metoclopramide] Other (See Comments)   Patient reports that his body tenses up and he has muscle spasms ? Tape [place] Organ Tape-Silicones] Itching, Dermatitis and Edema   Clear Plastic ROS10 systems reviewed, pertinent positives per HPI otherwise noted to be negative.PHYSICAL EXAMPatient Vitals for the past 24 hrs: BP Temp Temp src Pulse Resp SpO2 01/15/21 2247 117/68 98.4 ?F (36.9 ?C) Temporal (!) 92 16 94 % 01/15/21 1701 (!) 105/56 99 ?F (37.2 ?C) Oral 82 17 96 % 01/15/21 1137 118/75 98.2 ?F (36.8 ?C) Temporal 81 18 96 % GENERAL APPEARANCE: A 50 y.o. male in no acute distress, non-toxic, non-hypoxic in appearance.HEENT: Head normocephalic, atraumatic. PERRLA/EOMI. Nares patent. NECK: Supple. No tracheal deviation. PULMONARY: Pt in no respiratory distress. CTAB. CARDIOVASCULAR: Good capillary refill with no evidence of cyanosis. RRR.ABDOMEN: Soft, non-distended, non-tender to palpation. No rebound/guarding/peritoneal signs.EXTREMITIES: Atraumatic x4, moving all extremities. No evidence of cyanosis or edema.SKIN: Warm, dry without rash, ecchymosis or erythema.NEUROLOGIC: Alert and oriented. GCS 15. No focal neuro deficits. CN 2-12 grossly intact as tested. No gait abnormality; ambulatory without assistance. PSYCHIATRIC: Calm and pleasant affect, cooperative and interactive with staff.LABSI have reviewed all available labs for this visit prior to disposition.No results found for this visit on 01/15/21.RADIOLOGYNo orders to display ED COURSERelevant old records, labs and imaging are reviewed. During the patient's ED course, the patient was given:Medications divalproex (DEPAKOTE DR) tablet 500 mg (500 mg Oral Given 01/15/21 2101) haloperidoL (HALDOL) tablet 5 mg (has no administration in time range) haloperidol lactate (HALDOL) injection 5 mg (has no administration in time range) diphenhydrAMINE (BENADRYL) injection 50 mg (has no administration in time range) midazolam PF (VERSED) injection 2 mg (has no administration in time range) gabapentin (NEURONTIN) capsule 600 mg (has no administration in time range)  MDMMark Stout is a 50 y.o. male presents to the Emergency Department for evaluation of reported suicidal ideation reporting that he had a plan to cut his forearms and overdose on pills foot specifically Tylenol however walked to the police station told them that he was hearing voices and was transferred here to the emergency department. The patient  has a history of homelessness, PTSD, questionable history of psychosis, bipolar disorder, intermittent explosive disorder.  He has had 75 visits to the emergency department in the last 12 months in several states including [place], [place], [place], [place], [place], and [place].  He was most recently seen at [facility] overnight last night for reported chest pain.  He is not taking any medications currently, says that he stopped taking Depakote however when he was evaluated by the psychiatrist in the emergency department today, he was willing to restart his Depakote was given his 1st dose of 500 milligram tablet while in the emergency department. They are nontoxic appearing.  Vital signs were reviewed and addressed.  They were examined for acute injuries and illness; head to toe exam is unremarkable.  Appropriate tests were ordered in the ED to medically clear them for transfer to a behavioral health unit.  There is no significant evidence of underlying medical etiology for the patient?s current psychological issue, such as CVA, SAH, cerebral tumor, acute coronary syndrome, toxicity, shock, sepsis, electrolyte imbalance, thyroid irregularity, or intoxication needing inpatient detox. Patient will be placed on a hold in the ED pending re-evaluation with psychiatric crisis team in the morning to assess for stability and discussed disposition at that time.  Therefore signed out to the evening physician pending re-evaluation in the morning.  He has had no acute events during his time here in the emergency department.CLINICAL IMPRESSION  SNOMED [place](R) 1. Antisocial personality disorder (HC Code) (HC CODE) (HC Code)  ANTISOCIAL PERSONALITY DISORDER 2. Homelessness  HOMELESS 3. Malingering  FEIGNING OF SYMPTOMS 4. Psychosis, unspecified psychosis type (HC Code) (HC CODE) (HC Code)  PSYCHOTIC DISORDER DISPOSITION: PendingThey have been instructed to follow-up with No follow-up provider specified.DISCLAIMER: This chart was created using M-Modal dictation software. Efforts were made by me to ensure accuracy, however some errors may be present due to limitations of this technology and occasionally words are not transcribed as intended. Caswell Corwin, PA11/01/22 1749Patient was being held for observation while awaiting re-evaluation by crisis staff and disposition.  He had reported suicidal thoughts and command auditory hallucinations on arrival but did not seem to be exhibiting psychosis on the evaluation by the psychiatrist.  Patient has been visiting many different emergency departments and it was felt he may be malingering as well.  Patient remained stable overnight.Early in the morning patient started asking to be discharged and requesting to speak to the doctor several times.  I did eventually talk to him and he says he does not have any thoughts of hurting himself.  He said that he could never hurt himself.  He said he is planning to go cash a 200 dollar check.  He said in a couple days he is likely heading to [facility] and is just passing through here.  He says that he can go to the homeless shelter.  He is familiar with [facility].  He says he has intermittent explosive disorder and as a result he has trouble when he is stressed coming up with a rational plan.  He denies any hallucinations.  He did not exhibit any psychosis.  He contracts for safety.  Therefore, I do not think he needs to be held involuntarily.  He was discharged. Shela Leff, MD11/02/22 [phone]

## 2021-01-16 NOTE — Other
PHARMACY-ASSISTED MEDICATION REPORTPharmacist review of the best possible medication history obtained by the pharmacy medication history technician has been performed.  I have updated the home medication list and identified the following information that may be relevant to this admission.NOTES/RECOMMENDATIONS Medication list was updated to best possible state. Patient  provided some medication history but could not recall exact dose/frequency on their own during interview. The following resources were utilized to confirm the list: Patient interview , Pharmacy record (Called or Faxed): Name(s) of the pharmacy: [facility], and Clinic record [employer], phone call, fax) P atient stated that he took all his medication from a week ago from [facility] while he was there and he thinks that the [facility] there fills it for him. However, called [facility] if there's any script active from other locations,  but no patient record fills history in their system.Medication reconciliation completed prior to admission orders, please re-start home medications when appropriate.       Prior to Admission Medications Medication Name Sig Taking? Patient Reported   aspirin chewable tabletLast dose: Past Week at Unknown timeLast Medication Note: >> David Stall Jan 16, 2021  1:34 PMMedHX Tech(Aileen Elmyra Ricks, CPHT):  Patient reports that it was given to him from [facility] Entered by Marvell Fuller, CPHT Wed Jan 16, 2021 1334 Take 81 mg by mouth daily. Yes Yes   atorvastatin (LIPITOR) 20 MG tabletLast dose: Past Week at Unknown timeLast Medication Note: >> David Stall Jan 16, 2021  1:33 PMMedHX Tech(Aileen Elmyra Ricks, CPHT):  Patient reports that it was given to him from [facility] Entered by Marvell Fuller, CPHT Wed Jan 16, 2021 1333 Take 20 mg by mouth daily. Yes Yes   lisinopril (PRINIVIL,ZESTRIL) 10 MG tabletLast dose: Past Week at Unknown timeLast Medication Note: >> David Stall Jan 16, 2021  1:34 PMMedHX Tech(Aileen Elmyra Ricks, CPHT):  Patient reports that it was given to him from [facility] Entered by Marvell Fuller, CPHT Wed Jan 16, 2021 1334 Take 1 tablet (10 mg total) by mouth daily. Yes     nitroGLYCERIN (NITROSTAT) 0.4 MG SL tabletLast dose: Past Week at Unknown timeLast Medication Note: >> David Stall Jan 16, 2021  1:34 PMMedHX Tech(Aileen Elmyra Ricks, CPHT):  Patient reports that it was given to him from [facility] Entered by Marvell Fuller, CPHT Wed Jan 16, 2021 1334 Place 1 tablet (0.4 mg total) under the tongue every 5 (five) minutes as needed for Chest pain. Yes     Prior to admission medications last reviewed by Aldean Baker, PharmD on Wed Jan 16, 2021 1430 Thank Princella Pellegrini, PharmD11/2/20222:30 PMPhone: [phone]

## 2021-01-16 NOTE — ED Notes
10:13 PM Patient calm and cooperative, in his room watching TV most of this time, took his medications without incident.
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
## 2021-01-18 ENCOUNTER — Emergency Department (HOSPITAL_BASED_OUTPATIENT_CLINIC_OR_DEPARTMENT_OTHER): Payer: Refusal to Pay/Bad Debt

## 2021-01-18 ENCOUNTER — Other Ambulatory Visit: Payer: Self-pay

## 2021-01-18 ENCOUNTER — Emergency Department
Admission: EM | Admit: 2021-01-18 | Discharge: 2021-01-18 | Disposition: A | Discharge status: Home / Self Care | Payer: Refusal to Pay/Bad Debt | Attending: Student in an Organized Health Care Education/Training Program | Admitting: Student in an Organized Health Care Education/Training Program

## 2021-01-18 ENCOUNTER — Encounter (HOSPITAL_BASED_OUTPATIENT_CLINIC_OR_DEPARTMENT_OTHER): Payer: Self-pay

## 2021-01-18 DIAGNOSIS — R42 Dizziness and giddiness: Secondary | ICD-10-CM | POA: Insufficient documentation

## 2021-01-18 DIAGNOSIS — R110 Nausea: Secondary | ICD-10-CM | POA: Insufficient documentation

## 2021-01-18 DIAGNOSIS — R079 Chest pain, unspecified: Secondary | ICD-10-CM | POA: Diagnosis not present

## 2021-01-18 DIAGNOSIS — R0789 Other chest pain: Secondary | ICD-10-CM | POA: Insufficient documentation

## 2021-01-18 LAB — BMP (EXT)
Anion Gap (EXT): 10 mmol/L (ref 10–22)
BUN (EXT): 12 mg/dL (ref 7–18)
CO2 (EXT): 23 mmol/L (ref 21–32)
CalciumCalcium (EXT): 9.5 mg/dL (ref 8.5–10.1)
Chloride (EXT): 104 mmol/L (ref 98–107)
Creatinine (EXT): 0.6 mg/dL — ABNORMAL LOW (ref 0.7–1.2)
Glucose (EXT): 104 mg/dL (ref 74–160)
Potassium (EXT): 5.1 mmol/L (ref 3.5–5.1)
Sodium (EXT): 137 mmol/L (ref 136–145)
eGFR - Creat CKD-EPI (EXT): >60 mL/min (ref >60)

## 2021-01-18 LAB — BASIC METABOLIC PANEL
ANION GAP: 10 mmol/L (ref 10–22)
BUN (UREA NITROGEN): 12 mg/dL (ref 7–18)
CALCIUM: 9.5 mg/dl (ref 8.5–10.1)
CARBON DIOXIDE: 23 mmol/L (ref 21–32)
CHLORIDE: 104 mmol/L (ref 98–107)
CREATININE: 0.6 mg/dL — ABNORMAL LOW (ref 0.7–1.2)
ESTIMATED GLOMERULAR FILT RATE: >60 mL/min (ref >60)
Glucose Random: 104 mg/dL (ref 74–160)
POTASSIUM: 5.1 mmol/L (ref 3.5–5.1)
SODIUM: 137 mmol/L (ref 136–145)

## 2021-01-18 LAB — TROPONIN T HS BASELINE: TROPONIN T HS BASELINE: 6 ng/L (ref 0–15)

## 2021-01-18 LAB — CBC, PLATELET & DIFFERENTIAL
ABSOLUTE BASO COUNT: 0 10*3/uL (ref 0.0–0.1)
ABSOLUTE EOSINOPHIL COUNT: 0.2 10*3/uL (ref 0.0–0.8)
ABSOLUTE IMM GRAN COUNT: 0.02 10*3/uL (ref 0.00–0.03)
ABSOLUTE LYMPH COUNT: 1.2 10*3/uL (ref 0.6–5.9)
ABSOLUTE MONO COUNT: 0.8 10*3/uL (ref 0.2–1.4)
ABSOLUTE NEUTROPHIL COUNT: 3.3 10*3/uL (ref 1.6–8.3)
ABSOLUTE NRBC COUNT: 0 10*3/uL (ref 0.0–0.0)
BASOPHIL %: 0.4 % (ref 0.0–1.2)
EOSINOPHIL %: 3.7 % (ref 0.0–7.0)
HEMATOCRIT: 37 % — ABNORMAL LOW (ref 40.1–51.0)
HEMOGLOBIN: 11.9 g/dL — ABNORMAL LOW (ref 13.7–17.5)
IMMATURE GRANULOCYTE %: 0.4 % (ref 0.0–0.4)
LYMPHOCYTE %: 21.5 % (ref 15.0–54.0)
MEAN CORP HGB CONC: 32.2 g/dL (ref 31.0–37.0)
MEAN CORPUSCULAR HGB: 25.2 pg — ABNORMAL LOW (ref 26.0–34.0)
MEAN CORPUSCULAR VOL: 78.4 fl — ABNORMAL LOW (ref 80.0–100.0)
MEAN PLATELET VOLUME: 9 fL (ref 8.7–12.5)
MONOCYTE %: 14.3 % — ABNORMAL HIGH (ref 4.0–13.0)
NEUTROPHIL %: 59.7 % (ref 40.0–75.0)
NRBC %: 0 % (ref 0.0–0.0)
PLATELET COUNT: 314 10*3/uL (ref 150–400)
RBC DISTRIBUTION WIDTH STD DEV: 51.2 fL — ABNORMAL HIGH (ref 35.1–46.3)
RED BLOOD CELL COUNT: 4.72 M/uL (ref 4.60–6.10)
WHITE BLOOD CELL COUNT: 5.4 10*3/uL (ref 4.0–11.0)

## 2021-01-18 LAB — TROPONIN T HS 1 HOUR
DELTA 1 HOUR TROPONIN T HS: 3 ng/L (ref 0–4)
TROPONIN T HS 1 HOUR RESULT: 9 ng/L (ref 0–15)

## 2021-01-18 LAB — TROPONIN T HS 3 HOUR
DELTA 3 HOUR TROPONIN T HS: 1 ng/L (ref 0–7)
TROPONIN T HS 3 HOUR RESULT: 7 ng/L (ref 0–15)

## 2021-01-18 MED ORDER — MORPHINE SULFATE 4 MG/ML IV SOLN (SUPER ERX)
4.00 mg | Freq: Once | Status: AC
Start: 2021-01-18 — End: 2021-01-18
  Administered 2021-01-18: 4 mg via INTRAVENOUS
  Filled 2021-01-18: qty 1

## 2021-01-18 MED ORDER — ONDANSETRON HCL 4 MG/2ML IJ SOLN
4.0000 mg | Freq: Once | INTRAMUSCULAR | Status: AC
Start: 2021-01-18 — End: 2021-01-18
  Administered 2021-01-18: 4 mg via INTRAVENOUS
  Filled 2021-01-18: qty 2

## 2021-01-18 MED ORDER — MORPHINE SULFATE 4 MG/ML IV SOLN (SUPER ERX)
4.0000 mg | Freq: Once | Status: AC
Start: 2021-01-18 — End: 2021-01-18
  Administered 2021-01-18: 4 mg via INTRAVENOUS
  Filled 2021-01-18: qty 1

## 2021-01-18 MED ORDER — SODIUM CHLORIDE 0.9 % IV BOLUS
1000.0000 mL | Freq: Once | INTRAVENOUS | Status: AC
Start: 2021-01-18 — End: 2021-01-18
  Administered 2021-01-18: 1000 mL via INTRAVENOUS

## 2021-01-18 MED ORDER — MORPHINE SULFATE 2 MG/ML IV SOLN (SUPER ERX)
2.0000 mg | Freq: Once | Status: DC
Start: 2021-01-18 — End: 2021-01-18

## 2021-01-18 NOTE — Discharge Instructions (Addendum)
You were seen in the Emergency Department with chest pain.  You had blood tests, an EKG and a chest x-ray that did not show concerning findings.  The exact cause of your chest discomfort is not certain.  We performed labs on your heart that did not show any evidence of damage to your heart muscle.     Please continue your home medications, and follow up with your primary care physician within 48 hours if your symptoms persist.     Return to the ER if you experience severe chest pain, difficulty breathing, or with any new or concerning symptoms.

## 2021-01-18 NOTE — Progress Notes (Signed)
Pt stated he had no place to go upon discharge from ER.   SW found shelter [facility]).   SW  Wrote a Electronics engineer for transport and gave to the nurse.

## 2021-01-18 NOTE — ED Triage Note (Signed)
Pt arrives to ED via EMS.  Per EMS report pt has left sided chest pain r/t left side neck started 1 pm this afternoon.  Pt has hx of MI's in the past, last MI was in 2015.  Pt took 4 baby aspirin today around 1:30 pm.  Upon arrival pt reports 7/10 left sided chest pain r/t left neck.  + dizziness, + sob, + nausea, no vomiting, no diaphoresis.  EKG in process, placed on cardiac monitor. Pt reports he was seen at [facility] ER last night for chest pain was d/c with no cardiac findings.

## 2021-01-18 NOTE — ED Notes (Signed)
Bed: 14-A  Expected date:   Expected time:   Means of arrival:   Comments:  EMS

## 2021-01-18 NOTE — Narrator Note (Addendum)
Patient states pain isnt any better but states he will live with it refused cab voucher to the shelter stating he wanted to stay in the area refused d/c vitals wanted to leave

## 2021-01-18 NOTE — ED Provider Notes (Signed)
I have reviewed the ED nursing notes and prior records. I have reviewed the patient's past medical history/problem list, allergies, social history and medication list.  I saw this patient primarily.    HPI:  This 50 year old male patient presented to [facility] via [facility] with chief complaint of Chest Pain      Triage Documentation     Tonye Becket, RN 01/18/2021 14:04             Pt arrives to ED via EMS.  Per EMS report pt has left sided chest pain r/t left side neck started 1 pm this afternoon.  Pt has hx of MI's in the past, last MI was in 2015.  Pt took 4 baby aspirin today around 1:30 pm.  Upon arrival pt reports 7/10 left sided chest pain r/t left neck.  + dizziness, + sob, + nausea, no vomiting, no diaphoresis.  EKG in process, placed on cardiac monitor. Pt reports he was seen at [facility] ER last night for chest pain was d/c with no cardiac findings.            50 year old male with a reported history of hypertension, hyperlipidemia, past MI and stroke presents with chest pain.  Patient has an extensive medical record in [place].  He has received care throughout the [place] for similar symptoms in the past.  Most recently he was at [facility], before taking a train to [place].  Patient reports that he was seen at [facility] in [place] yesterday for the same.  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  He states that he was at the train station today, when he developed acute onset of chest pain.  Reports that it went to his left side.  Associated with some shortness of breath, and dizziness.  He also is now developing headache.  He denies nausea, vomiting or diarrhea.  No weakness or numbness on one side of his body.  No fever or coughing.    Review of Systems:  Pertinent positives and negatives were reviewed as per the  HPI above. All other systems were reviewed and are negative.      Past Medical History/Problem list:  History reviewed. No pertinent past medical history.  There is no problem list on file for this patient.    Past Surgical History:   History reviewed. No pertinent surgical history.  Social History:   Social History     Socioeconomic History    Marital status: Unknown     Spouse name: Not on file    Number of children: Not on file    Years of education: Not on file    Highest education level: Not on file   Occupational History    Not on file   Tobacco Use    Smoking status: Not on file    Smokeless tobacco: Not on file   Substance and Sexual Activity    Alcohol use: Not on file    Drug use: Not on file    Sexual activity: Not on file   Other Topics Concern    Not on file   Social History Narrative    Not on file   Social Determinants of Health  Financial Resource Strain: Not on file  Food Insecurity: Not on file  Transportation Needs: Not on file  Physical Activity: Not on file  Stress: Not on file  Social Connections: Not on file  Intimate Partner Violence: Not on file  Housing Stability: Not on file  Allergies:  Review of Patient's Allergies indicates:   Benadryl [diphenhyd*    Other (See Comments)   Reglan [metoclopram*    Other (See Comments)   Toradol [ketorolac *    Other (See Comments)   Tramadol                Other (See Comments)      Physical Exam:  ED Triage Vitals [01/18/21 1357]   ED Triage Vitals Brief Group      Temp 98.1 F      Pulse 94      Resp 16      BP 112/80      SpO2 97 %      Pain Score 7        General: Patient is awake and alert, resting comfortably in no acute distress  Head: Normocephalic and atraumatic  Eyes: Normal inspection, extraocular muscles intact  Ear, nose, throat: Normal external exam  Neck: Normal range of motion, trachea midline  Respiratory: No respiratory distress, clear to auscultation bilaterally  Cardiovascular: Regular rate and rhythm, without murmur  appreciated  GI: abdomen is soft, nontender, nondistended  Back: normal inspection, normal range of motion  Upper Extremities: normal inspection, normal range of motion  Lower Extremities: normal inspection, normal range of motion  Neuro: The patient is alert and oriented to person, place, and time, appropriately conversive. No gross motor deficits noted   Skin: warm, dry, normal color  Psych: normal affect      Medications Given in the ED:    Medications   morphine injection 4 mg (4 mg Intravenous Given 01/18/21 1545)   ondansetron (ZOFRAN) injection 4 mg (4 mg Intravenous Given 01/18/21 1545)   sodium chloride 0.9 % IV bolus 1,000 mL (1,000 mLs Intravenous New Bag 01/18/21 1549)       Lab Results:     Labs Reviewed   CBC, PLATELET & DIFFERENTIAL - Abnormal; Notable for the following components:       Result Value    HEMOGLOBIN 11.9 (*)     HEMATOCRIT 37.0 (*)     MEAN CORPUSCULAR VOL 78.4 (*)     MEAN CORPUSCULAR HGB 25.2 (*)     RBC DISTRIBUTION WIDTH STD DEV 51.2 (*)     MONOCYTE % 14.3 (*)     All other components within normal limits   BASIC METABOLIC PANEL - Abnormal; Notable for the following components:    CREATININE 0.6 (*)     All other components within normal limits   TROPONIN T HS BASELINE   TROPONIN T HS 1 HOUR                 ED Course and Medical Decision-making:    The patient is 50 year old male with above history who presents to the Emergency Department with chest pain.    EKG 99 beats minute, normal sinus rhythm, no acute ischemic changes.    50 year old male with a reported history of myocardial infarction presents with chest pain.  Patient is nontoxic-appearing.  Initial ECG is nonischemic.  Given his reported history of CAD and myocardial infarction in the past, plan for work-up including serial EKGs and troponins.  Patient reports taking aspirin and Tylenol prior to arrival.  He has multiple medication allergies.  Patient was counseled that he  will receive 1 dose of morphine and Zofran while in  the emergency department, and will only receive further doses if he has any abnormal findings on his work-up.  He is already inquiring about his time of discharge, as he needs transportation into [place] and help with shelters.    Patient spoke with social work, and received a Clinical cytogeneticist as well as referral to the [employer] in [place].    Initial troponin is negative.  Delta troponin is pending at time of signout to Dr. Sylvan Cheese.  If this is negative, plan for patient discharge.        While in the ED patient received:   Medications   morphine injection 4 mg (4 mg Intravenous Given 01/18/21 1545)   ondansetron (ZOFRAN) injection 4 mg (4 mg Intravenous Given 01/18/21 1545)   sodium chloride 0.9 % IV bolus 1,000 mL (1,000 mLs Intravenous New Bag 01/18/21 1549)       There are no discharge medications for this patient.      Patient Vitals for the past 24 hrs:   BP Temp Pulse Resp SpO2 Weight   01/18/21 1545 112/80 -- 85 16 -- --   01/18/21 1357 112/80 98.1 F 94 16 97 % 79.4 kg (175 lb)       Disposition: Discharge    Patient Condition: Stable     Initial Impression:  Other chest pain      Stevphen Rochester, MD  [facility]  Attending Physician  Emergency Department      This Emergency Department patient encounter note was created using voice-recognition software and in real time during the ED visit. Please excuse any typographical errors that have not been edited out.

## 2021-01-18 NOTE — Narrator Note (Signed)
Patient Disposition  Patient education for diagnosis, medications, activity, diet and follow-up.  Patient left ED 8:40 PM.  Patient rep received written instructions.yes    Interpreter to provide instructions: No    Patient belongings with patient: YES    Have all existing LDAs been addressed? Yes    Have all IV infusions been stopped? Yes    Destination: Discharged to home

## 2021-01-18 NOTE — ED Notes (Signed)
ED Attending Physician Sign Out Note     I received sign out from: Dr. Lissa Merlin at 1700.    Pertinent HPI and pending factors:  Briefly, this is a 50 year old male who comes to the emergency department with chest pain.  Patient got medication here and is currently pain-free.  Initial troponin negative.  EKG nonischemic.  Pending second troponin and if negative can discharge.    Hospital Course / MDM   Vital signs for the past 48 hours:  Patient Vitals for the past 48 hrs:   BP Temp Pulse Resp SpO2 Weight   01/18/21 1545 112/80 -- 85 16 -- --   01/18/21 1357 112/80 98.1 F 94 16 97 % 79.4 kg (175 lb)       Medications   morphine injection 4 mg (4 mg Intravenous Given 01/18/21 1545)   ondansetron (ZOFRAN) injection 4 mg (4 mg Intravenous Given 01/18/21 1545)   sodium chloride 0.9 % IV bolus 1,000 mL (1,000 mLs Intravenous New Bag 01/18/21 1549)     Results for orders placed or performed during the hospital encounter of 01/18/21 (from the past 24 hour(s))   CBC, Platelet & Differential    Collection Time: 01/18/21  3:30 PM   Result Value    WHITE BLOOD CELL COUNT 5.4    RED BLOOD CELL COUNT 4.72    HEMOGLOBIN 11.9 (L)    HEMATOCRIT 37.0 (L)    MEAN CORPUSCULAR VOL 78.4 (L)    MEAN CORPUSCULAR HGB 25.2 (L)    MEAN CORP HGB CONC 32.2    RBC DISTRIBUTION WIDTH STD DEV 51.2 (H)    PLATELET COUNT 314    MEAN PLATELET VOLUME 9.0    NEUTROPHIL % 59.7    IMMATURE GRANULOCYTE % 0.4    LYMPHOCYTE % 21.5    MONOCYTE % 14.3 (H)    EOSINOPHIL % 3.7    BASOPHIL % 0.4    NRBC % 0.0    ABSOLUTE NEUTROPHIL COUNT 3.3    ABSOLUTE IMM GRAN COUNT 0.02    ABSOLUTE LYMPH COUNT 1.2    ABSOLUTE MONO COUNT 0.8    ABSOLUTE EOSINOPHIL COUNT 0.2    ABSOLUTE BASO COUNT 0.0    ABSOLUTE NRBC COUNT 0.0   Basic Metabolic Panel    Collection Time: 01/18/21  3:30 PM   Result Value    SODIUM 137    POTASSIUM 5.1    CHLORIDE 104    CARBON DIOXIDE 23    ANION GAP 10    CALCIUM 9.5    Glucose Random 104    BUN (UREA NITROGEN) 12    CREATININE 0.6 (L)    ESTIMATED  GLOMERULAR FILT RATE > 60   Troponin T HS Baseline    Collection Time: 01/18/21  3:30 PM   Result Value    TROPONIN T HS BASELINE 6   Troponin T HS 1 Hour    Collection Time: 01/18/21  4:44 PM   Result Value    TROPONIN T HS 1 HOUR RESULT 9    DELTA 1 HOUR TROPONIN T HS 3   Troponin T HS 3 Hour    Collection Time: 01/18/21  7:26 PM   Result Value    TROPONIN T HS 3 HOUR RESULT 7    DELTA 3 HOUR TROPONIN T HS 1         Patient had serial troponins over 3 hours with no evidence of ACS.  He reported recurrence of his chest pain while  I was talking to him.  He is provided with reassurance and told to continue his home medications and follow-up with his primary care.    Disposition     Diagnosis:   Other chest pain    Condition: Stable    Disposition:   Discharge    Rebeca Allegra, MD  Emergency Medicine Attending [place]

## 2021-01-18 NOTE — Narrator Note (Signed)
Pt reports no chest pain at this time.  Denies sob, no dizziness, no nausea.

## 2021-01-18 NOTE — Narrator Note (Signed)
Patient very upset and agitated stating no one is discussing his results with him and is still in pain patient requesting another dose of morphine md made aware

## 2021-01-18 NOTE — Narrator Note (Signed)
Pt ambulated to the bathroom with steady gait.

## 2021-01-18 NOTE — Narrator Note (Signed)
Report given to Emily, RN.

## 2021-01-18 NOTE — ED Notes (Signed)
Provided food  Provided fluid

## 2021-01-18 NOTE — Narrator Note (Signed)
Dinner tray provided .  Pt is now siting up in stretcher, eating dinner.  No acute distress noted.

## 2021-01-21 LAB — EKG

## 2021-02-03 ENCOUNTER — Emergency Department (HOSPITAL_BASED_OUTPATIENT_CLINIC_OR_DEPARTMENT_OTHER)

## 2021-02-03 ENCOUNTER — Inpatient Hospital Stay
Admit: 2021-02-03 | Discharge: 2021-02-03 | Disposition: A | Discharge status: Home / Self Care | Payer: MEDICAID | Attending: Emergency Medicine

## 2021-02-03 ENCOUNTER — Encounter

## 2021-02-03 ENCOUNTER — Encounter (HOSPITAL_BASED_OUTPATIENT_CLINIC_OR_DEPARTMENT_OTHER)

## 2021-02-03 DIAGNOSIS — R1031 Right lower quadrant pain: Secondary | ICD-10-CM

## 2021-02-03 DIAGNOSIS — R109 Unspecified abdominal pain: Secondary | ICD-10-CM

## 2021-02-03 LAB — COMPREHENSIVE METABOLIC PANEL
ALT: 25 U/L (ref 0–55)
AST: 20 U/L (ref 6–42)
Albumin: 4.2 g/dL (ref 3.2–5.0)
Alkaline phosphatase: 129 U/L (ref 30–130)
Anion Gap: 8 mmol/L (ref 3–14)
BUN: 15 mg/dL (ref 6–24)
Bilirubin, total: 0.2 mg/dL (ref 0.2–1.2)
CO2 (Bicarbonate): 22 mmol/L (ref 20–32)
Calcium: 9.2 mg/dL (ref 8.5–10.5)
Chloride: 107 mmol/L (ref 98–110)
Creatinine: 0.67 mg/dL (ref 0.55–1.30)
Glucose: 105 mg/dL (ref 70–139)
Potassium: 4 mmol/L (ref 3.6–5.2)
Protein, total: 6.7 g/dL (ref 6.0–8.4)
Sodium: 137 mmol/L (ref 135–146)
eGFRcr: 114 mL/min/{1.73_m2} (ref >=60)

## 2021-02-03 LAB — URINE DRUG SCREEN
Amphetamines screen, urine: NOT DETECTED
Barbiturates screen, urine: NOT DETECTED
Benzodiazepines screen, urine: NOT DETECTED
Buprenorphine screen, urine: NOT DETECTED
Cannabinoids screen, urine: DETECTED — AB
Cocaine screen, urine: NOT DETECTED
Creatinine, urine, specimen validity: 82.72 mg/dL (ref >=20.00)
Ethanol screen, urine: NOT DETECTED
Fentanyl screen, urine: NOT DETECTED
Methadone screen, urine: NOT DETECTED
Opiates screen, urine: DETECTED — AB
Oxycodone screen, urine: NOT DETECTED

## 2021-02-03 LAB — CBC WITH DIFFERENTIAL
Basophils %: 0.2 %
Basophils Absolute: 0.01 10*3/uL (ref 0.00–0.22)
Eosinophils %: 2.6 %
Eosinophils Absolute: 0.13 10*3/uL (ref 0.00–0.50)
Hematocrit: 37.3 % (ref 37.0–53.0)
Hemoglobin: 12 g/dL — ABNORMAL LOW (ref 13.0–17.5)
Immature Granulocytes %: 0.2 %
Immature Granulocytes Absolute: 0.01 10*3/uL (ref 0.00–0.10)
Lymphocyte %: 20.3 %
Lymphocytes Absolute: 1.03 10*3/uL (ref 0.70–4.00)
MCH: 25.4 pg — ABNORMAL LOW (ref 26.0–34.0)
MCHC: 32.2 g/dL (ref 31.0–37.0)
MCV: 79 fL — ABNORMAL LOW (ref 80.0–100.0)
MPV: 8.5 fL — ABNORMAL LOW (ref 9.1–12.4)
Monocytes %: 15.6 %
Monocytes Absolute: 0.79 10*3/uL (ref 0.38–0.83)
NRBC %: 0 % (ref 0.0–0.0)
NRBC Absolute: 0 10*3/uL (ref 0.00–2.00)
Neutrophil %: 61.1 %
Neutrophils Absolute: 3.1 10*3/uL (ref 1.50–7.95)
Platelets: 333 10*3/uL (ref 150–400)
RBC: 4.72 M/uL (ref 4.20–5.90)
RDW-CV: 16.6 % — ABNORMAL HIGH (ref 11.5–14.5)
RDW-SD: 47.8 fL (ref 35.0–51.0)
WBC: 5.1 10*3/uL (ref 4.0–11.0)

## 2021-02-03 LAB — SDS, SERUM DRUG SCREEN
Acetaminophen level: <3 ug/mL (ref <=30)
Barbiturates screen, serum: NOT DETECTED
Benzodiazepines screen, serum: NOT DETECTED
Ethanol level, plasma/serum: <10 mg/dL (ref <=10)
Salicylate level: <5 mg/dL (ref 0.0–30.0)
Tricyclics screen, serum: NOT DETECTED mg/dL

## 2021-02-03 LAB — LIPASE: Lipase: 42 U/L (ref 8–60)

## 2021-02-03 MED ORDER — iopamidol (Isovue-370) 370 mg iodine /mL (76 %) injection 70 mL
370 | Freq: Once | INTRAVENOUS | Status: AC
Start: 2021-02-03 — End: 2021-02-03
  Administered 2021-02-03: 08:00:00 70 mL via INTRAVENOUS

## 2021-02-03 MED ORDER — ondansetron (Zofran) injection 4 mg
4 | Freq: Once | INTRAMUSCULAR | Status: AC
Start: 2021-02-03 — End: 2021-02-03
  Administered 2021-02-03: 07:00:00 4 mg via INTRAVENOUS

## 2021-02-03 MED ORDER — morphine injection 2 mg
4 | Freq: Once | INTRAVENOUS | Status: AC
Start: 2021-02-03 — End: 2021-02-03
  Administered 2021-02-03: 09:00:00 2 mg via INTRAVENOUS

## 2021-02-03 MED ORDER — sodium chloride 0.9 % infusion
0.9 | INTRAVENOUS | Status: DC
Start: 2021-02-03 — End: 2021-02-03
  Administered 2021-02-03: 07:00:00 125 mL/h via INTRAVENOUS

## 2021-02-03 MED ORDER — polyethylene glycol (Glycolax) 17 gram packet
17 | PACK | Freq: Every day | ORAL | 0 refills | 28.00000 days | Status: AC
Start: 2021-02-03 — End: 2021-02-10

## 2021-02-03 MED FILL — MORPHINE 4 MG/ML INTRAVENOUS SOLUTION WRAPPER: 4 4 mg/mL | INTRAVENOUS | Qty: 1

## 2021-02-03 MED FILL — ONDANSETRON HCL (PF) 4 MG/2 ML INJECTION SOLUTION: 4 4 mg/2 mL | INTRAMUSCULAR | Qty: 2

## 2021-02-03 NOTE — ED Triage Notes (Signed)
 Pt reports abd pain. Pt was seen [facility] days [facility] CT showed enlarged gallbladder and dilation pancreatic duct with antbx and percocet. Pt reports he got off the bus from [place] and the pain developed again.

## 2021-02-03 NOTE — ED Provider Notes (Signed)
 HPI   Chief Complaint   Patient presents with   ? Abdominal Pain       HPI  This is a 50 year old male who reports a history of coronary artery disease, hypertension, and prior stroke, who presents for evaluation of dental pain.  Initially developed the symptoms several days ago: Acute onset of crampy and sharp right lower quadrant abdominal pain.  There were no other associated signs or symptoms aside from nausea: No vomiting diarrhea testicle pain dysuria or urethral discharge or rash, no flank pain, no chest pain shortness breath cough pleuritic pain or exertional dyspnea, no fever, no malaise, no headache no near syncope.  He reports that he went to [facility], where he underwent a CT scan of the abdomen and pelvis.  He was discharged with a diagnosis of nonspecific abdominal pain, prescribed oral antibiotics and Percocet, and was told that imaging showed a minimally dilated gallbladder and he was advised to follow-up with gastroenterology.  He was symptom-free until today.  He came to [place] from [place], ate dinner with a friend, and developed acute onset of recurrent symptoms, similar to his prior episode, acute onset of crampy and sharp right lower quadrant abdominal pain without other associated signs or symptoms aside from nausea.    SOCIAL HISTORY: Patient recently traveled here from [place] Coma Scale Score: 15                                  Patient History   Past Medical History:   Diagnosis Date   ? Hypertension      History reviewed. No pertinent surgical history.  No family history on file.  Social History     Tobacco Use   ? Smoking status: Not on file   ? Smokeless tobacco: Not on file   Substance Use Topics   ? Alcohol use: Not on file   ? Drug use: Not on file       Review of Systems   Review of Systems  Constitutional: No fever.  Eyes: No yellow discoloration.  ENT: No sore throat.  Heme/lymph: No swollen glands.  Pulmonary: No cough, shortness of breath,  exertional dyspnea, or pleuritic pain.  Cardiac: No chest pain or palpitations.  Gastrointestinal: Reports nausea and right lower quadrant abdominal pain.  No diarrhea no vomiting.  Genitourinary: No testicle pain or urethral discharge or dysuria.  Skin: No rash or vesicular lesions.  Musculoskeletal: No leg swelling or calf pain.  Neuro: No headache or syncope.  Allergy/immune: Reports allergy to tramadol and ketorolac      Physical Exam   ED Triage Vitals [02/03/21 0039]   Temp Pulse Resp BP   36.9 ?C (98.4 ?F) 99 18 121/80      SpO2 Temp src Heart Rate Source Patient Position   97 % -- -- --      BP Location FiO2 (%)     -- --       Physical Exam   Constitutional: Awake, sitting comfortably.  Eyes are without icterus.  ENT: Oral mucosa is moist.  Neck without lymphadenopathy or jugular venous distention.  Back without CVA tenderness.  Lungs are clear and equal bilaterally without rales rhonchi or wheezes.  Heart regular rate and rhythm without murmur.  Abdomen is soft and nondistended, no guarding or peritonitis, no organomegaly, negative  Murphy sign, there is tenderness to the right lower quadrant.  Skin without rash erythema or vesicular lesions to the involved area gait extremities are without edema or calf tenderness.  Neuro: Awake and alert with a GCS of 15.    No orders to display       Labs Reviewed   CBC WITH DIFFERENTIAL - WAM AND NON-WAM - Abnormal       Result Value    WBC 5.1      RBC 4.72      Hemoglobin 12.0 (*)     Hematocrit 37.3      MCV 79.0 (*)     MCH 25.4 (*)     MCHC 32.2      RDW-CV 16.6 (*)     RDW-SD 47.8      Platelets 333      MPV 8.5 (*)     Neutrophil % 61.1      Lymphocyte % 20.3      Monocytes % 15.6      Eosinophils % 2.6      Basophils % 0.2      Immature Granulocytes % 0.2      NRBC % 0.0      Neutrophils Absolute 3.10      Lymphocytes Absolute 1.03      Monocytes Absolute 0.79      Eosinophils Absolute 0.13      Basophils Absolute 0.01      Immature Granulocytes Absolute 0.01       NRBC Absolute 0.00     LIPASE - Normal    Lipase 42     SDS, SERUM DRUG SCREEN - Normal    Acetaminophen level <3      Ethanol level, plasma/serum <10      Salicylate level <5.0      Barbiturates screen, serum Not Detected      Benzodiazepines screen, serum Not Detected      Tricyclics screen, serum Not Detected     COMPREHENSIVE METABOLIC PANEL    Sodium 137      Potassium 4.0      Chloride 107      CO2 (Bicarbonate) 22      Anion Gap 8      BUN 15      Creatinine 0.67      eGFRcr 114      Glucose 105      Fasting? Unknown      Calcium 9.2      AST 20      ALT 25      Alkaline phosphatase 129      Protein, total 6.7      Albumin 4.2      Bilirubin, total 0.2     CBC W/DIFF    Narrative:     The following orders were created for panel order CBC and differential.  Procedure                               Abnormality         Status                     ---------                               -----------         ------  CBC w/ Differential[[id number]]           Abnormal            Final result                 Please view results for these tests on the individual orders.   URINE DRUG SCREEN       ED Course & MDM        MDM  This is a nontoxic 50 year old male with a history of hypertension coronary artery disease and reporting of a prior stroke, who presents for evaluation of recurrent right lower quadrant abdominal pain.  He does have right lower quadrant abdominal tenderness on examination, but there is no guarding or diffuse peritonitis.  He is afebrile with normal vital signs.  Labs are interpreted by me and are unremarkable with the exception of a minimal anemia with a hemoglobin of 12.  I reviewed the patient's discharge instructions from [facility], he was discharged with a diagnosis of nonspecific abdominal pain and was prescribed Augmentin for unclear reason.  There was notation that he had a minimally dilated gallbladder and was advised to follow-up with gastroenterology.   However, his symptoms today are not located in the right upper quadrant, and they are not consistent with acute cholelithiasis, he has no right upper quadrant tenderness, and negative Murphy sign, his symptoms located to the right lower quadrant.  Differential diagnosis includes knops of abdominal pain, appendicitis, colitis, diverticulitis.  There is no diarrhea.  I do not otherwise have access to the [facility] medical center records, however, review of EDIE reveals that the patient has had 66 separate emergency department visits across the country over the past 12 months, most commonly for syncope or cardiac symptoms, occasionally for abdominal pain.  We will obtain a CT scan of the abdomen and pelvis to further evaluate.  Care transferred to Dr. Lonia Skinner B. Akshath Mccarey, MD  02/03/21 [id number]

## 2021-02-03 NOTE — ED Notes (Signed)
 Patient given crackers and juice. Tolerating PO at this time.      Mingo Amber, RN  02/03/21 [phone]

## 2021-02-03 NOTE — Discharge Instructions (Addendum)
 Your CT showed nonspecific mesenteric stranding, without a localized infectious or inflammatory process and large colonic stool burden. Recommend miralax as prescribe for constipation. It was a pleasure caring for you.

## 2021-02-03 NOTE — ED Provider Notes (Signed)
 EMERGENCY DEPARTMENT ENCOUNTER  TRANSFER OF CARE ATTENDING NOTE    7:47 AM: I assumed care of patient from Dr. Sheryn Bison pending CT A/P and disposition. Briefly, Austin Klein is a 50 y.o. male who  has a past medical history of Hypertension..  The patient presents with a complaint of Abdominal Pain      HPI: Please see the note by attending physician handing off.    LABS  Labs Reviewed   URINE DRUG SCREEN - Abnormal       Result Value    Amphetamines screen, urine Not Detected      Barbiturates screen, urine Not Detected      Benzodiazepines screen, urine Not Detected      Buprenorphine screen, urine Not Detected      Cannabinoids screen, urine Detected (*)     Cocaine screen, urine Not Detected      Ethanol screen, urine Not Detected      Fentanyl screen, urine Not Detected      Methadone screen, urine Not Detected      Opiates screen, urine Detected (*)     Oxycodone screen, urine Not Detected      Creatinine, urine, specimen validity 82.72      Narrative:     These are unconfirmed screening results and should be used only for medical purposes. If the clinical setting requires confirmation, contact the clinical laboratory.     CBC WITH DIFFERENTIAL - WAM AND NON-WAM - Abnormal    WBC 5.1      RBC 4.72      Hemoglobin 12.0 (*)     Hematocrit 37.3      MCV 79.0 (*)     MCH 25.4 (*)     MCHC 32.2      RDW-CV 16.6 (*)     RDW-SD 47.8      Platelets 333      MPV 8.5 (*)     Neutrophil % 61.1      Lymphocyte % 20.3      Monocytes % 15.6      Eosinophils % 2.6      Basophils % 0.2      Immature Granulocytes % 0.2      NRBC % 0.0      Neutrophils Absolute 3.10      Lymphocytes Absolute 1.03      Monocytes Absolute 0.79      Eosinophils Absolute 0.13      Basophils Absolute 0.01      Immature Granulocytes Absolute 0.01      NRBC Absolute 0.00     LIPASE - Normal    Lipase 42     SDS, SERUM DRUG SCREEN - Normal    Acetaminophen level <3      Ethanol level, plasma/serum <10      Salicylate level <5.0      Barbiturates  screen, serum Not Detected      Benzodiazepines screen, serum Not Detected      Tricyclics screen, serum Not Detected     COMPREHENSIVE METABOLIC PANEL    Sodium 137      Potassium 4.0      Chloride 107      CO2 (Bicarbonate) 22      Anion Gap 8      BUN 15      Creatinine 0.67      eGFRcr 114      Glucose 105      Fasting? Unknown  Calcium 9.2      AST 20      ALT 25      Alkaline phosphatase 129      Protein, total 6.7      Albumin 4.2      Bilirubin, total 0.2     CBC W/DIFF    Narrative:     The following orders were created for panel order CBC and differential.  Procedure                               Abnormality         Status                     ---------                               -----------         ------                     CBC w/ Differential[[id number]]           Abnormal            Final result                 Please view results for these tests on the individual orders.       RADIOLOGY  X-rays contemporaneously visualized and interpreted by me.    CT ABDOMEN PELVIS W CONTRAST   Preliminary Result   1.  Mild nonspecific mesenteric stranding, without a localized infectious or inflammatory process to correlate with the patient's symptoms.   2.  Large colonic stool burden.      DICTATED: Su Hilt, MD   IN [facility] WITH DEPARTMENT POLICY, TEACHING PHYSICIANS REVIEW ALL IMAGES, AND EDIT REPORTS AS REQUIRED. A REPORT IS NOT FINAL UNTIL APPROVED BY A STAFF RADIOLOGIST.          MEDICAL DECISION MAKING / ED COURSE  50 year old male who presented with right lower quadrant abdominal pain.  Recent work-up at outside hospital.  Patient care signed out pending CT abdomen/pelvis which shows nonspecific mesenteric stranding and large colonic stool burden.  Patient was able to tolerate p.o. on reexamination.  He has been standing at his room door and requesting to be discharged.  He was discharged with MiraLAX for constipation.    Pertinent labs and imaging studies reviewed.    Diagnoses as of 02/03/21 0747    Abdominal pain, unspecified abdominal location   Constipation, unspecified constipation type       CLINICAL IMPRESSION  1. Abdominal pain, unspecified abdominal location    2. Constipation, unspecified constipation type        FINAL DISPOSITION  Discharged    By signing my name below, I, Austin Klein, attest that this documentation has been prepared under the direction and presence of Dr. Jill Alexanders C. Nataley Bahri, DO.    Electronically signed: Janina Klein, Scribe. 02/03/21 7:47 AM.    Consent to use a scribe was obtained prior to the start of the encounter by clinical staff.    Scribe attestation: I have personally performed the services described in this documentation, as written by my scribe above in my presence. It is both accurate and complete when signed by me. I have performed any pertinent edits.     Charlyne Quale, MD  02/03/21 [phone]

## 2021-02-03 NOTE — Other (Signed)
 Patient Education   Table of Contents       Abdominal Pain, Adult       Constipation, Adult     To view videos and all your education online visit,   [email or web address].[email or web address]   or scan this QR code with your smartphone.                    Abdominal Pain, Adult     Pain in the abdomen (abdominal pain) can be caused by many things. Often, abdominal pain is not serious and it gets better with no treatment or by being treated at home. However, sometimes abdominal pain is serious.   Your health care provider will ask questions about your medical history and do a physical exam to try to determine the cause of your abdominal pain.   Follow these instructions at home:   Medicines         Take over-the-counter and prescription medicines only as told by your health care provider.      Do not  take a laxative unless told by your health care provider.     General instructions            Watch your condition for any changes.       Drink enough fluid to keep your urine pale yellow.       Keep all follow-up visits as told by your health care provider. This is important.       Contact a health care provider if:         Your abdominal pain changes or gets worse.       You are not hungry or you lose weight without trying.       You are constipated or have diarrhea for more than 2?3 days.       You have pain when you urinate or have a bowel movement.       Your abdominal pain wakes you up at night.       Your pain gets worse with meals, after eating, or with certain foods.       You are vomiting and cannot keep anything down.       You have a fever.       You have blood in your urine.     Get help right away if:         Your pain does not go away as soon as your health care provider told you to expect.       You cannot stop vomiting.       Your pain is only in areas of the abdomen, such as the right side or the left lower portion of the abdomen. Pain on the right side could be caused by appendicitis.       You have bloody or  black stools, or stools that look like tar.       You have severe pain, cramping, or bloating in your abdomen.      You have signs of dehydration, such as:       Dark urine, very little urine, or no urine.       Cracked lips.       Dry mouth.       Sunken eyes.       Sleepiness.       Weakness.       You have trouble breathing or chest pain.     Summary  Often, abdominal pain is not serious and it gets better with no treatment or by being treated at home. However, sometimes abdominal pain is serious.       Watch your condition for any changes.       Take over-the-counter and prescription medicines only as told by your health care provider.       Contact a health care provider if your abdominal pain changes or gets worse.       Get help right away if you have severe pain, cramping, or bloating in your abdomen.     This information is not intended to replace advice given to you by your health care provider. Make sure you discuss any questions you have with your health care provider.     Document Released: 09/27/2006Document Revised: 02/05/2021Document Reviewed: 07/12/2018     Elsevier Patient Education ? 2022 [employer]         Constipation, Adult     Constipation is when a person has fewer than three bowel movements in a week, has difficulty having a bowel movement, or has stools (feces) that are dry, hard, or larger than normal. Constipation may be caused by an underlying condition. It may become worse with age if a person takes certain medicines and does not take in enough fluids.   Follow these instructions at home:   Eating and drinking            Eat foods that have a lot of fiber, such as beans, whole grains, and fresh fruits and vegetables.       Limit foods that are low in fiber and high in fat and processed sugars, such as fried or sweet foods. These include french fries, hamburgers, cookies, candies, and soda.       Drink enough fluid to keep your urine pale yellow.     General instructions          Exercise regularly or as told by your health care provider. Try to do 150 minutes of moderate exercise each week.       Use the bathroom when you have the urge to go. Do not  hold it in.       Take over-the-counter and prescription medicines only as told by your health care provider. This includes any fiber supplements.      During bowel movements:       Practice deep breathing while relaxing the lower abdomen.       Practice pelvic floor relaxation.       Watch your condition for any changes. Let your health care provider know about them.       Keep all follow-up visits as told by your health care provider. This is important.       Contact a health care provider if:         You have pain that gets worse.       You have a fever.       You do not have a bowel movement after 4 days.       You vomit.       You are not hungry or you lose weight.       You are bleeding from the opening between the buttocks (anus).       You have thin, pencil-like stools.     Get help right away if:         You have a fever and your symptoms suddenly get worse.  You leak stool or have blood in your stool.       Your abdomen is bloated.       You have severe pain in your abdomen.       You feel dizzy or you faint.     Summary         Constipation is when a person has fewer than three bowel movements in a week, has difficulty having a bowel movement, or has stools (feces) that are dry, hard, or larger than normal.       Eat foods that have a lot of fiber, such as beans, whole grains, and fresh fruits and vegetables.       Drink enough fluid to keep your urine pale yellow.       Take over-the-counter and prescription medicines only as told by your health care provider. This includes any fiber supplements.     This information is not intended to replace advice given to you by your health care provider. Make sure you discuss any questions you have with your health care provider.     Document Released: 09/15/2005Document Revised:  11/04/2020Document Reviewed: 01/19/2019     Elsevier Patient Education ? 2022 Elsevier Inc.

## 2021-02-04 ENCOUNTER — Observation Stay
Admission: EM | Admit: 2021-02-04 | Discharge: 2021-02-04 | Discharge status: Against Medical Advice | Payer: Self-pay | Attending: Internal Medicine | Admitting: Internal Medicine

## 2021-02-04 ENCOUNTER — Other Ambulatory Visit: Payer: Self-pay

## 2021-02-04 ENCOUNTER — Emergency Department: Payer: Self-pay

## 2021-02-04 DIAGNOSIS — I4891 Unspecified atrial fibrillation: Secondary | ICD-10-CM | POA: Insufficient documentation

## 2021-02-04 DIAGNOSIS — R55 Syncope and collapse: Secondary | ICD-10-CM | POA: Insufficient documentation

## 2021-02-04 DIAGNOSIS — Z8673 Personal history of transient ischemic attack (TIA), and cerebral infarction without residual deficits: Secondary | ICD-10-CM | POA: Insufficient documentation

## 2021-02-04 DIAGNOSIS — F17210 Nicotine dependence, cigarettes, uncomplicated: Secondary | ICD-10-CM | POA: Insufficient documentation

## 2021-02-04 DIAGNOSIS — M549 Dorsalgia, unspecified: Secondary | ICD-10-CM | POA: Insufficient documentation

## 2021-02-04 DIAGNOSIS — E785 Hyperlipidemia, unspecified: Secondary | ICD-10-CM | POA: Insufficient documentation

## 2021-02-04 DIAGNOSIS — G8929 Other chronic pain: Secondary | ICD-10-CM | POA: Insufficient documentation

## 2021-02-04 DIAGNOSIS — R079 Chest pain, unspecified: Principal | ICD-10-CM | POA: Insufficient documentation

## 2021-02-04 LAB — COMPREHENSIVE METABOLIC PANEL
ALT: 24 U/L (ref 0–55)
AST (SGOT): 18 U/L (ref 5–41)
Albumin/Globulin Ratio: 1.6 (ref 0.9–2.2)
Albumin: 3.7 g/dL (ref 3.5–5.0)
Alkaline Phosphatase: 122 U/L — ABNORMAL HIGH (ref 37–117)
Anion Gap: 9 (ref 5.0–15.0)
BUN: 12 mg/dL (ref 9.0–28.0)
Bilirubin, Total: 0.3 mg/dL (ref 0.2–1.2)
CO2: 22 mEq/L (ref 17–29)
Calcium: 9.1 mg/dL (ref 8.5–10.5)
Chloride: 108 mEq/L (ref 99–111)
Creatinine: 0.6 mg/dL (ref 0.5–1.5)
Globulin: 2.3 g/dL (ref 2.0–3.6)
Glucose: 96 mg/dL (ref 70–100)
Potassium: 4.3 mEq/L (ref 3.5–5.3)
Protein, Total: 6 g/dL (ref 6.0–8.3)
Sodium: 139 mEq/L (ref 135–145)

## 2021-02-04 LAB — CBC AND DIFFERENTIAL
Absolute NRBC: 0 10*3/uL (ref 0.00–0.00)
Basophils Absolute Automated: 0.02 10*3/uL (ref 0.00–0.08)
Basophils Automated: 0.4 %
Eosinophils Absolute Automated: 0.15 10*3/uL (ref 0.00–0.44)
Eosinophils Automated: 2.9 %
Hematocrit: 34.8 % — ABNORMAL LOW (ref 37.6–49.6)
Hgb: 11.1 g/dL — ABNORMAL LOW (ref 12.5–17.1)
Immature Granulocytes Absolute: 0.02 10*3/uL (ref 0.00–0.07)
Immature Granulocytes: 0.4 %
Lymphocytes Absolute Automated: 1.25 10*3/uL (ref 0.42–3.22)
Lymphocytes Automated: 24.3 %
MCH: 25.2 pg (ref 25.1–33.5)
MCHC: 31.9 g/dL (ref 31.5–35.8)
MCV: 78.9 fL (ref 78.0–96.0)
MPV: 9.2 fL (ref 8.9–12.5)
Monocytes Absolute Automated: 0.52 10*3/uL (ref 0.21–0.85)
Monocytes: 10.1 %
Neutrophils Absolute: 3.18 10*3/uL (ref 1.10–6.33)
Neutrophils: 61.9 %
Nucleated RBC: 0 /100 WBC (ref 0.0–0.0)
Platelets: 338 10*3/uL (ref 142–346)
RBC: 4.41 10*6/uL (ref 4.20–5.90)
RDW: 17 % — ABNORMAL HIGH (ref 11–15)
WBC: 5.14 10*3/uL (ref 3.10–9.50)

## 2021-02-04 LAB — GFR: EGFR: >60

## 2021-02-04 LAB — ECG 12-LEAD
QTC Calculation (Bezet): 424 ms
Ventricular Rate: 72 {beats}/min

## 2021-02-04 LAB — IHS D-DIMER: D-Dimer: 0.33 ug/mL FEU (ref 0.00–0.60)

## 2021-02-04 LAB — CK: Creatine Kinase (CK): 152 U/L (ref 47–294)

## 2021-02-04 LAB — TROPONIN I: Troponin I: <0.01 ng/mL (ref 0.00–0.05)

## 2021-02-04 MED ORDER — ASPIRIN 81 MG PO CHEW
81.0000 mg | CHEWABLE_TABLET | Freq: Every day | ORAL | Status: DC
Start: 2021-02-05 — End: 2021-02-04

## 2021-02-04 MED ORDER — OXYCODONE-ACETAMINOPHEN 5-325 MG PO TABS
1.0000 | ORAL_TABLET | Freq: Once | ORAL | Status: DC
Start: 2021-02-04 — End: 2021-02-04

## 2021-02-04 MED ORDER — ONDANSETRON 4 MG PO TBDP
4.0000 mg | ORAL_TABLET | Freq: Four times a day (QID) | ORAL | Status: DC | PRN
Start: 2021-02-04 — End: 2021-02-04

## 2021-02-04 MED ORDER — FAMOTIDINE 20 MG PO TABS
20.0000 mg | ORAL_TABLET | Freq: Two times a day (BID) | ORAL | Status: DC | PRN
Start: 2021-02-04 — End: 2021-02-04

## 2021-02-04 MED ORDER — ACETAMINOPHEN 500 MG PO TABS
1000.0000 mg | ORAL_TABLET | Freq: Once | ORAL | Status: DC
Start: 2021-02-04 — End: 2021-02-04
  Filled 2021-02-04: qty 2

## 2021-02-04 MED ORDER — ENOXAPARIN SODIUM 40 MG/0.4ML IJ SOSY
40.0000 mg | PREFILLED_SYRINGE | Freq: Every day | INTRAMUSCULAR | Status: DC
Start: 2021-02-04 — End: 2021-02-04

## 2021-02-04 MED ORDER — POLYETHYLENE GLYCOL 3350 17 G PO PACK
17.0000 g | PACK | Freq: Every day | ORAL | Status: DC | PRN
Start: 2021-02-04 — End: 2021-02-04

## 2021-02-04 MED ORDER — ONDANSETRON HCL 4 MG/2ML IJ SOLN
4.0000 mg | Freq: Four times a day (QID) | INTRAMUSCULAR | Status: DC | PRN
Start: 2021-02-04 — End: 2021-02-04

## 2021-02-04 MED ORDER — ACETAMINOPHEN 325 MG PO TABS
650.0000 mg | ORAL_TABLET | Freq: Four times a day (QID) | ORAL | Status: DC | PRN
Start: 2021-02-04 — End: 2021-02-04

## 2021-02-04 NOTE — ED Notes (Signed)
Marland Kitchen                     [facility] EMERGENCY DEPARTMENT RESIDENT H&P       CLINICAL INFORMATION        HPI:      Chief Complaint: No chief complaint on file.  [name] [name] is a 50 y.o. male with history of MI x2, CVAx2, HTN, HLD, and intermittent explosive disorder who presents via EMS with 4 hours of left sided chest pain, waxing and waning in severity, with radiation to left shoulder and shoulder blade. The pain started while he was standing on a train platform. After several seconds of pain he states that he lost consciousness and woke up with vomit nearby. He also reports associated nausea and headache. He took aspirin 324mg  prior to EMS arrival and was given nitro x3 en route to ED by EMS with minimal improvement in pain. He endorses recent travel from [place] a few days ago via bus. He also recently moved to the [place] from [place]. He notes mild RLE tingling earlier today that has now resolved. No previous history of blood clots. He was recently diagnosed with Afib and was told to take Plavix but he has not been able to fill his prescription yet.     History obtained from: Patient          ROS:      Review of Systems   Constitutional: Negative for chills, diaphoresis, fatigue and fever.   HENT: Negative for congestion, hearing loss, sore throat and voice change.    Respiratory: Negative for cough and shortness of breath.    Cardiovascular: Positive for chest pain. Negative for palpitations and leg swelling.   Gastrointestinal: Positive for nausea. Negative for abdominal pain, constipation and diarrhea.   Genitourinary: Negative for dysuria and flank pain.   Musculoskeletal: Negative for back pain, neck pain and neck stiffness.   Skin: Negative for rash and wound.   Neurological: Positive for syncope and headaches. Negative for dizziness, seizures, light-headedness and numbness.         Physical Exam:      Pulse 68  BP 109/65  Resp 18  SpO2 95 %  Temp 98.2 F (36.8 C)    Physical  Exam  Vitals reviewed.   Constitutional:       General: He is not in acute distress.     Appearance: Normal appearance. He is normal weight. He is not ill-appearing, toxic-appearing or diaphoretic.   HENT:      Head: Normocephalic and atraumatic.      Nose: Nose normal.      Mouth/Throat:      Mouth: Mucous membranes are moist.      Pharynx: Oropharynx is clear.   Eyes:      Extraocular Movements: Extraocular movements intact.   Cardiovascular:      Rate and Rhythm: Normal rate and regular rhythm.      Pulses: Normal pulses.      Heart sounds: Normal heart sounds. No murmur heard.    No gallop.   Pulmonary:      Effort: Pulmonary effort is normal. No respiratory distress.      Breath sounds: Normal breath sounds. No wheezing, rhonchi or rales.   Chest:      Chest wall: No tenderness.   Abdominal:      General: There is no distension.      Palpations: Abdomen is soft.  Tenderness: There is no abdominal tenderness. There is no guarding.   Musculoskeletal:         General: No swelling, tenderness, deformity or signs of injury. Normal range of motion.      Cervical back: Normal range of motion and neck supple.   Skin:     General: Skin is warm and dry.      Capillary Refill: Capillary refill takes less than 2 seconds.   Neurological:      General: No focal deficit present.      Mental Status: He is alert and oriented to person, place, and time.      Motor: No weakness.   Psychiatric:      Comments: Occasional outbursts                 PAST HISTORY        Primary Care Provider: Pcp, None, MD        PMH/PSH:    .     History reviewed. No pertinent past medical history.    He has no past surgical history on file.      Social/Family History:    Social  - Tobacco smoker  - No reported alcohol or drug use    Family  - Multiple male family members with early MI (49s-50s)    Listed Medications on Arrival:    .     Home Medications     Med List Status: In Progress Set By: Abelina Bachelor, RN at 02/04/2021  4:11 PM    No  Medications         Allergies: He is allergic to ketorolac and tramadol.            VISIT INFORMATION        Reassessments/Clinical Course:      ED Course as of 02/04/21 1734   Mon Feb 04, 2021   1615 XR Chest 2 Views  IMPRESSION:   1. Borderline heart size.  2. Slightly prominent interstitial markings possibly mild underlying chronic change.    CXR largely unremarkable. No significant airspace disease. No bony injury.  [VL]   1616 ECG 12 Lead  EKG NSR @72  BPM, normal axis,no ST elevation [VL]   1705 Troponin I: <0.01  Initial troponin negative. Plan for admission for ACS rule out given concerning history.  [VL]   1733 Patient admitted to obs-tele for ACS rule out.  [VL]      ED Course User Index  [VL] Tivis Ringer Lawana Chambers, MD          Conversations with Other Providers:               Medications Given in the ED:    .     ED Medication Orders (From admission, onward)    Start Ordered     Status Ordering Provider    02/04/21 1659 02/04/21 1658  acetaminophen (TYLENOL) tablet 1,000 mg  Once        Route: Oral  Ordered Dose: 1,000 mg     Last MAR action: Hold Aziyah Provencal L            Procedures:      Procedures      Assessment/Plan:      50 year old male with history of MIx2, CVAx2 presenting with 4 hours of waxing and waning left sided chest pain and one episode of syncope. Hemodynamically stable in the emergency room. Initial EKG showing NSR, no sign of ischemia. CXR unremarkable. Physical  exam largely unremarkable - no significant distress, non labored respirations, normal heart and lung sounds to auscultation, no focal neuro deficits. Given history of MIs concern for ACS. Labs pending including troponin, CBC, CMP. Lower suspicion for PE but will obtain d-dimer to rule out.            Ronnell Freshwater, MD  Resident  02/04/21 1643       Ronnell Freshwater, MD  Resident  02/04/21 1647       Ronnell Freshwater, MD  Resident  02/04/21 1708       Ronnell Freshwater, MD  Resident  02/04/21 [phone]

## 2021-02-04 NOTE — ED Provider Notes (Signed)
[name] EMERGENCY DEPARTMENT H&P                                             ATTENDING SUPERVISORY NOTE       CLINICAL SUMMARY        Diagnosis:    .     Final diagnoses:   Chest pain with high risk for cardiac etiology   Syncope with normal neurologic examination         Attending Note:      [name] [name] is a 50 y.o. 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 Chest pain feels like his heart is being pulled out of his chest. He felt like he was about to pass out, then awoke on ground in his own vomit. Unsure if he hit his head. Took 324mg  of aspirin and called 911. EMS gave 3 dose nitro with minimal relief. EKG showed normal sinus and no Ischemia. Complaining of nausea and headache, still with nausea. Chest pain 8/10.    Has not seen cardio here because he just moved here from the [facility]. Recent travel by bus. Diagnosed with Afib last week. Prescribed Plavix but has not started. Not taking any anticoagulants.     Constitutional: Vital signs reviewed. Well appearing. No apparent distress.  Head: Normocephalic, atraumatic  Eyes: EOMI, PERRLA, No conjunctival injection. No discharge.  ENT: Mucous membranes moist. Normal appearing oropharynx. No swelling.  Neck: Normal range of motion. Non-tender.  Respiratory/Chest: Clear to auscultation. No respiratory distress.   Cardiovascular: Regular rate and rhythm. No murmur.   Abdomen: Soft and non-tender. No guarding. Non-distended. No masses or hepatosplenomegaly.  UpperExtremity: No edema. No cyanosis.  LowerExtremity: No edema. No cyanosis.  Neurological: No focal motor deficits by observation. Speech normal. Memory normal.  Skin: Warm and dry. No rash.  Psychiatric: Normal affect. Normal concentration.     O2 sat-                   saturation: 95 %; Oxygen use: room air;  Interpretation: Normal    EKG -             interpreted by me: normal sinus rhythm @ 72. normal axis, no ST elevation. Radiology -     interpreted by me with the following observations: chest xray per radiology report reviewed by me.      MDM Notes:     Pt w chest pain, significant cardiac hx. Ekg and trop negative. Placed in obs for cardiac consult and further eval and management.             Disposition:          ED Disposition       ED Disposition   Observation    Condition   --    Date/Time   Mon Feb 04, 2021  5:32 PM    Comment   Admitting Physician: Boykin Peek [[id number]]   Service:: Medicine [[id number]]   Estimated Length of Stay: < 2 midnights   Tentative Discharge Plan?: Home or Self Care [1]   Does patient need telemetry?: Yes   Telemetry type (separate Telemetry order is also required):: Cardiac telemetry  VISIT INFORMATION        Clinical Course in the ED:      Throughout the stay in the Emergency Department, questions and concerns surrounding pain management, care plan, diagnostic studies, effects of medications administered or prescribed, and future care plan were assessed and addressed.    ED Course as of 02/04/21 1749   Mon Feb 04, 2021   1615 XR Chest 2 Views  IMPRESSION:   1. Borderline heart size.  2. Slightly prominent interstitial markings possibly mild underlying chronic change.    CXR largely unremarkable. No significant airspace disease. No bony injury.  [VL]   1616 ECG 12 Lead  EKG NSR @72  BPM, normal axis,no ST elevation [VL]   1705 Troponin I: <0.01  Initial troponin negative. Plan for admission for ACS rule out given concerning history.  [VL]   1733 Patient admitted to obs-tele for ACS rule out.  [VL]      ED Course User Index  [VL] Ronnell Freshwater, MD         Medications Given in the ED:    .     ED Medication Orders (From admission, onward)      Start Ordered     Status Ordering Provider    02/04/21 1749 02/04/21 1748  enoxaparin (LOVENOX) syringe 40 mg  Daily         Route: Subcutaneous  Ordered Dose: 40 mg     Ordered WARNER, ALATA R    02/04/21 1747 02/04/21 1748  famotidine (PEPCID) tablet 20 mg  Every 12 hours PRN        Route: Oral  Ordered Dose: 20 mg     Ordered WARNER, ALATA R    02/04/21 1747 02/04/21 1748  acetaminophen (TYLENOL) tablet 650 mg  Every 6 hours PRN        Route: Oral  Ordered Dose: 650 mg     Ordered WARNER, ALATA R    02/04/21 1747 02/04/21 1748  polyethylene glycol (MIRALAX) packet 17 g  Daily PRN        Route: Oral  Ordered Dose: 17 g     Ordered WARNER, ALATA R    02/04/21 1747 02/04/21 1748  ondansetron (ZOFRAN-ODT) disintegrating tablet 4 mg  Every 6 hours PRN        Route: Oral  Ordered Dose: 4 mg    See Hyperspace for full Linked Orders Report.    Ordered WARNER, ALATA R    02/04/21 1747 02/04/21 1748  ondansetron (ZOFRAN) injection 4 mg  Every 6 hours PRN        Route: Intravenous  Ordered Dose: 4 mg    See Hyperspace for full Linked Orders Report.    Ordered WARNER, ALATA R    02/04/21 1659 02/04/21 1658  acetaminophen (TYLENOL) tablet 1,000 mg  Once        Route: Oral  Ordered Dose: 1,000 mg     Last MAR action: Hold LARSEN, VICTORIA L              Procedures:      I was present during key portions of any procedures performed.            PAST HISTORY        Primary Care Provider: Pcp, None, MD        PMH/PSH:    .     History reviewed. No pertinent past medical history.    He has no past surgical history on file.  Social/Family History:      He reports that he has been smoking cigarettes. He has been smoking an average of .5 packs per day. He has never used smokeless tobacco. He reports current alcohol use. He reports that he does not currently use drugs.    No family history on file.      Listed Medications on Arrival:    .     Previous Medications    No medications on file      Allergies: He is allergic to ketorolac and tramadol.            RESULTS        Lab Results:      Results       Procedure Component Value Units Date/Time     D-Dimer [[id number]] Collected: 02/04/21 1659     Updated: 02/04/21 1714    Troponin I [[id number]] Collected: 02/04/21 1605    Specimen: Blood Updated: 02/04/21 1704     Troponin I <0.01 ng/mL     GFR [[id number]] Collected: 02/04/21 1605     Updated: 02/04/21 1700     EGFR >60.0       Comprehensive metabolic panel [[id number]]  (Abnormal) Collected: 02/04/21 1605    Specimen: Blood Updated: 02/04/21 1700     Glucose 96 mg/dL      BUN 23.5 mg/dL      Creatinine 0.6 mg/dL      Sodium 573 mEq/L      Potassium 4.3 mEq/L      Chloride 108 mEq/L      CO2 22 mEq/L      Calcium 9.1 mg/dL      Protein, Total 6.0 g/dL      Albumin 3.7 g/dL      AST (SGOT) 18 U/L      ALT 24 U/L      Alkaline Phosphatase 122 U/L      Bilirubin, Total 0.3 mg/dL      Globulin 2.3 g/dL      Albumin/Globulin Ratio 1.6     Anion Gap 9.0    CBC and differential [[id number]]  (Abnormal) Collected: 02/04/21 1605    Specimen: Blood Updated: 02/04/21 1651     WBC 5.14 x10 3/uL      Hgb 11.1 g/dL      Hematocrit 62.3 %      Platelets 338 x10 3/uL      RBC 4.41 x10 6/uL      MCV 78.9 fL      MCH 25.2 pg      MCHC 31.9 g/dL      RDW 17 %      MPV 9.2 fL      Neutrophils 61.9 %      Lymphocytes Automated 24.3 %      Monocytes 10.1 %      Eosinophils Automated 2.9 %      Basophils Automated 0.4 %      Immature Granulocytes 0.4 %      Nucleated RBC 0.0 /100 WBC      Neutrophils Absolute 3.18 x10 3/uL      Lymphocytes Absolute Automated 1.25 x10 3/uL      Monocytes Absolute Automated 0.52 x10 3/uL      Eosinophils Absolute Automated 0.15 x10 3/uL      Basophils Absolute Automated 0.02 x10 3/uL      Immature Granulocytes Absolute 0.02 x10 3/uL      Absolute NRBC 0.00 x10 3/uL  Radiology Results:      XR Chest 2 Views   Final Result         1. Borderline heart size.   2. Slightly prominent interstitial markings possibly mild underlying   chronic change.      Will Bonnet, MD    02/04/2021 2:02 PM                  Supervisory Statements:        I have  reviewed and agree with the resident's history and physical exam except as noted above. I have reviewed and agree with the final ED diagnosis and plan.      Scribe Attestation:      I was acting as a Neurosurgeon for Boykin Peek, MD on [employer]  Treatment Team: Scribe: Louann Liv     I am the first provider for this patient and I personally performed the services documented. Treatment Team: Scribe: Louann Liv is scribing for me on [facility]. This note and the patient instructions accurately reflect work and decisions made by me.  Boykin Peek, MD              Aggie Moats, MD  02/05/21 [place]

## 2021-02-04 NOTE — H&P (Signed)
MEDICINE HISTORY & PHYSICAL         Date Time  02/04/21 5:43 PM   Patient Name  [name] [name], [name] [name]   DOB  08-25-1970   MRN  [id number]   Unit  S F/S F   Attending Physician  Boykin Peek, MD     Primary Care Physician: Pcp, None, MD       CHIEF COMPLAINT:Chest pain    Assessment:   [name] [name] is a 50 y.o. male with a PMHx of TBI, explosive disorder, MI 2013, 2015, CVAs in 2019 and 2021, ?Atrial Fibrillation who was admitted to observation for an acute onset of chest pain with radiation to his left jaw and whole left arm and diaphoresis followed by syncope for 3-4 minutes and 1 episode of NB emesis      Workup  VSS  EKG: NSR  H/H 11.1/34.8, ALP 122, negative trop x 1, D-dimer in process  CXR: borderline heart size, mild underlying interstitial markings  PLAN:   # Chest pain   # Syncope suspect vasovagal from pain but with hx of CVA and TBI,   - Admit to Observation, monitor on telemetry  - Serial troponins  - Check Urine Drug screen  - check CK  - check head CT  - A1C, Lipid panel in AM  - [place] consult in AM  - Resume asa 81mg  in AM  - Resume lisinopril  - Heart Healthy diet now, NPO after midnight for cardiology    # hx of CVA 2019 and 2021  - continue asa and atorvastatin    # HLD  - Lipid panel in AM  - Atorvastatin 40mg  daily    # Hx of TBI more than 30 years ago  # Hx of explosive disorder, s/p electric shock therapy  - No current medications  - Outpatient psychiatry consult    # Chronic back pain  - recently received a prescription for 6 tabs of Percocet from Dr. Debbra Riding on 01/31/21 from [place], [place]  - Uses extra strength tylenol, up to 4g daily  - Tylenol 1000mg  every 6 hours PRN  - Oxycodone 5mg  every 6 hours PRN  - No IV narcotics  - check UDS    # Atrial Fibrillation  - CHADS2VASC2 = 4  - ER notes patient states he was diagnosed with afib 2 weeks ago, patient does not currently endorse this  - There are no available records to confirm this   - EKG NSR  - Monitor on telemetry  - If signs of  atrial fibrillation here, will plan to start eliquis  - Cardiology consult in AM    Expected [place]: Likely [place] in AM  DVT ppx: LOVENOX  Interpreter: no, not indicated  Code Status: FULL CODE  Diet: Cardiac Diet                                                                              Please See attending internal medicine note that follows this mid-level encounter note  Disposition:   Today's date: 02/04/2021  Admit Date: 02/04/2021  3:38 PM  Anticipated medical stability for discharge:Yellow - maybe tomorrow  Service status: Observation: Due to ACS r/o, cards consult  Reason for ongoing hospitalization: Same as above  Anticipated discharge needs: outpatient follow up  History of Presenting Illness:   [name] [name] is a 50 y.o. male has no past medical history on file.     He presents to the hospital with an acute onset of sharp and pressurized chest pain with radiation to his left jaw, whole left arm, and left side of back associated with diaphoresis. He then states he passed out for 3-4 minutes, woke up and had one episode of NB emesis. He took 4 baby asa and then called 911. He denies biting his tongue or losing control of his bowel and bladder and denies any confusion or lethargy afterwards. He was feeling his normal self prior to this event. He recently relocated to the area from [place], [place]. He works for [employer] and moves around every couple of weeks. He has not seen a cardiologist since his last MI in 2015 which took place in [place]. He states he had a LHC but no stent placement.     Of note, ER notes reflect patient was diagnosed with atrial fibrillation a couple of weeks ago. Currently patient does not reflect upon this and is not on anticoagulation or a betablocker.    In the ER, VSS. EKG NSR, no signs of acute ischemia. CBC , BMP, trop and D-dimer grossly unremarkable. CXR with borderline heart size, slightly prominent interstitial markings.         No emergency contact information on  file.      Past Medical History:   History reviewed. No pertinent past medical history.  Available old records reviewed.   Past Surgical History:   History reviewed. No pertinent surgical history.  Family History:   No family history on file.  Social History:     Social History     Socioeconomic History    Marital status: Divorced     Spouse name: Not on file    Number of children: Not on file    Years of education: Not on file    Highest education level: Not on file   Occupational History    Not on file   Tobacco Use    Smoking status: Every Day     Packs/day: 0.50     Types: Cigarettes    Smokeless tobacco: Never   Substance and Sexual Activity    Alcohol use: Yes    Drug use: Not Currently    Sexual activity: Not on file   Other Topics Concern    Not on file   Social History Narrative    Not on file     Social Determinants of Health     Financial Resource Strain: Not on file   Food Insecurity: Not on file   Transportation Needs: Not on file   Physical Activity: Not on file   Stress: Not on file   Social Connections: Not on file   Intimate Partner Violence: Not on file   Housing Stability: Not on file     Allergies:     Allergies   Allergen Reactions    Ketorolac     Tramadol      Medications:     Prior to Admission medications    Not on File     Review of Systems:     All other systems were reviewed and negative  Review of Systems   Constitutional: Negative.    HENT: Negative.     Respiratory: Negative.     Cardiovascular:  Positive for chest pain (dull, 2/10). Negative for palpitations, orthopnea, claudication, leg swelling and PND.   Gastrointestinal: Negative.    Genitourinary: Negative.    Musculoskeletal: Negative.    Skin: Negative.    Neurological: Negative.    Endo/Heme/Allergies: Negative.    Psychiatric/Behavioral: Negative.     Physical Exam:   BP 119/65   Pulse 84   Temp 98.2 F (36.8 C)   Resp 18   Wt 79.4 kg (175 lb)   SpO2 95%     Physical Exam  Vitals and nursing note reviewed.    Constitutional:       General: He is in acute distress (angry, mild pressure speech).      Appearance: He is not ill-appearing, toxic-appearing or diaphoretic.   HENT:      Head: Normocephalic.      Right Ear: External ear normal.      Left Ear: External ear normal.      Nose: Nose normal. No congestion.      Mouth/Throat:      Mouth: Mucous membranes are moist.      Pharynx: Oropharynx is clear. No oropharyngeal exudate.   Eyes:      Extraocular Movements: Extraocular movements intact.      Conjunctiva/sclera: Conjunctivae normal.      Pupils: Pupils are equal, round, and reactive to light.   Cardiovascular:      Rate and Rhythm: Normal rate and regular rhythm.      Pulses: Normal pulses.      Heart sounds: Normal heart sounds. No murmur heard.    No friction rub. No gallop.   Pulmonary:      Effort: Pulmonary effort is normal. No respiratory distress.      Breath sounds: Normal breath sounds. No stridor. No wheezing, rhonchi or rales.   Chest:      Chest wall: No tenderness.   Abdominal:      General: Abdomen is flat. Bowel sounds are normal. There is no distension.      Palpations: Abdomen is soft. There is no mass.      Tenderness: There is no abdominal tenderness. There is no right CVA tenderness, left CVA tenderness, guarding or rebound.      Hernia: No hernia is present.   Musculoskeletal:         General: No swelling, tenderness, deformity or signs of injury. Normal range of motion.      Cervical back: Normal range of motion.      Right lower leg: No edema.      Left lower leg: No edema.   Skin:     General: Skin is warm and dry.      Capillary Refill: Capillary refill takes 2 to 3 seconds.      Coloration: Skin is not jaundiced or pale.      Findings: No bruising, erythema, lesion or rash.   Neurological:      General: No focal deficit present.      Mental Status: He is alert and oriented to person, place, and time.      Cranial Nerves: No cranial nerve deficit.   Psychiatric:         Thought Content:  Thought content normal.       Labs/Imaging:   I have Personally Reviewed   Results       Procedure Component Value Units Date/Time    D-Dimer [[id number]] Collected: 02/04/21 1659     Updated: 02/04/21 1714    Troponin I [  [id number]] Collected: 02/04/21 1605    Specimen: Blood Updated: 02/04/21 1704     Troponin I <0.01 ng/mL     GFR [[id number]] Collected: 02/04/21 1605     Updated: 02/04/21 1700     EGFR >60.0       Comprehensive metabolic panel [[id number]]  (Abnormal) Collected: 02/04/21 1605    Specimen: Blood Updated: 02/04/21 1700     Glucose 96 mg/dL      BUN 08.6 mg/dL      Creatinine 0.6 mg/dL      Sodium 578 mEq/L      Potassium 4.3 mEq/L      Chloride 108 mEq/L      CO2 22 mEq/L      Calcium 9.1 mg/dL      Protein, Total 6.0 g/dL      Albumin 3.7 g/dL      AST (SGOT) 18 U/L      ALT 24 U/L      Alkaline Phosphatase 122 U/L      Bilirubin, Total 0.3 mg/dL      Globulin 2.3 g/dL      Albumin/Globulin Ratio 1.6     Anion Gap 9.0    CBC and differential [[id number]]  (Abnormal) Collected: 02/04/21 1605    Specimen: Blood Updated: 02/04/21 1651     WBC 5.14 x10 3/uL      Hgb 11.1 g/dL      Hematocrit 41.3 %      Platelets 338 x10 3/uL      RBC 4.41 x10 6/uL      MCV 78.9 fL      MCH 25.2 pg      MCHC 31.9 g/dL      RDW 17 %      MPV 9.2 fL      Neutrophils 61.9 %      Lymphocytes Automated 24.3 %      Monocytes 10.1 %      Eosinophils Automated 2.9 %      Basophils Automated 0.4 %      Immature Granulocytes 0.4 %      Nucleated RBC 0.0 /100 WBC      Neutrophils Absolute 3.18 x10 3/uL      Lymphocytes Absolute Automated 1.25 x10 3/uL      Monocytes Absolute Automated 0.52 x10 3/uL      Eosinophils Absolute Automated 0.15 x10 3/uL      Basophils Absolute Automated 0.02 x10 3/uL      Immature Granulocytes Absolute 0.02 x10 3/uL      Absolute NRBC 0.00 x10 3/uL           ECG: Normal Sinus Rhythm  Radiology:   Radiology Results (24 Hour)       Procedure Component Value Units Date/Time    XR Chest 2 Views [[id number]]  Collected: 02/04/21 1358    Order Status: Completed Updated: 02/04/21 1451    Narrative:      HISTORY: Chest pain     COMPARISON: None.    FINDINGS: Heart size is borderline. Calcific atherosclerotic change of  aorta is identified. Slight loss in height of at least 2 thoracic  vertebral bodies is identified. Mild gaseous distention of bowel is seen  in the included upper abdomen. Slightly prominent interstitial markings  are identified. No focal areas of consolidation are seen. No pleural  effusions.      Impression:          1. Borderline heart size.  2. Slightly prominent interstitial markings  possibly mild underlying  chronic change.    Will Bonnet, MD   02/04/2021 2:02 PM          Brock Bad, FNP  Adult Observation Unit Nurse Practitioner  [phone] or [phone] on [phone]

## 2021-02-04 NOTE — ED Triage Notes (Signed)
L sided chest pain that started "four hours ago." Per patient, HX of MI and 2 CVA. 3 nitro given with minimal relief. Stated he gets intermittent chest pain, to the point where it feels like someone is ripping it out at times. A&ox4. Denies shortness of breath at this time.

## 2021-02-04 NOTE — ED Notes (Signed)
This patient was seen by me in triage and initial testing was ordered based on presenting complaint. Care was expedited. I am not the primary provider for this patient.        Elesa Hacker, MD  02/04/21 1322

## 2021-02-04 NOTE — ED Notes (Signed)
MD at bedside. 

## 2021-02-04 NOTE — Plan of Care (Signed)
MEDICINE PLAN OF CARE    Date Time: 02/04/21 7:33 PM  Patient Name: [name] [name]  Attending Physician: Morton Peters, DO  B 11/B 11  02/04/21  7:33 PM    Tarl GLENDEN ROSSELL is a 50 y.o. male with a PMHx of TBI, explosive disorder, MI 2013, 2015, CVAs in 2019 and 2021, ?Atrial Fibrillation who was admitted to observation for an acute onset of chest pain with radiation to his left jaw and whole left arm and diaphoresis followed by syncope for 3-4 minutes and 1 episode of NB emesis.    Security was called to bedside in ED due to verbal threats of punching wall and hurting nurses. Upset over how long it took him to get seen and pain medicine in the ED. Despite my best attempts to use therapeutic communication to calm patient down and address medical needs, he has elected to leave against medical advise.     The patient has decided not to proceed with further recommended testing or treatment to determine the cause of their symptoms. The risks, including, but not limited to, death or permanent neurologic or cardiac sequalae were explained to the patient.  The patient is fully alert, oriented, and competent for self care and decision making at this time.  Alternatives to the recommendation were discussed and the patient voiced understanding. Again, the patient appears clinically to have capacity to make this decision. The patient was instructed that he/she could return to the emergency department at any time.       VITALS   BP 116/74   Pulse 91   Temp 98.2 F (36.8 C) (Oral)   Resp 20   Ht 1.854 m (6\' 1" ) Comment: prior chart  Wt 79.4 kg (175 lb)   SpO2 96%   BMI 23.09 kg/m   Body mass index is 23.09 kg/m.  Telemetry: NSR   Signed by: Brock Bad, FNP  Discussed in detail with attending: Morton Peters, DO  Adult Observation Unit Nurse Practitioner  [phone] or [phone]

## 2021-02-04 NOTE — ED Notes (Signed)
[facility] EMERGENCY DEPT  ED NURSING NOTE FOR THE RECEIVING INPATIENT NURSE   ED NURSE [place]   [facility] [id number]   ED CHARGE RN Marisue Ivan   ADMISSION INFORMATION   Kazuki Ingle Rodkey is a 50 y.o. male admitted with an ED diagnosis of:    1. Chest pain with high risk for cardiac etiology    2. Syncope with normal neurologic examination         Isolation: None   Allergies: Ketorolac and Tramadol   Holding Orders confirmed? N/A   Belongings Documented? N/A   Home medications sent to pharmacy confirmed? N/A   NURSING CARE   Patient Comes From:   Mental Status: Home Independent  alert and oriented   ADL: Needs assistance with ADLs   Ambulation: mild difficulty   Pertinent Information  and Safety Concerns:       intermittent chest pain that started four hours ago; significant cardiac hx; A&Ox4; ambulatory     CT / NIH   CT Head ordered on this patient?  No   NIH/Dysphagia assessment done prior to admission? No   VITAL SIGNS (at the time of this note)      Vitals:    02/04/21 1738   BP: 120/70   Pulse: 89   Resp:    Temp:    SpO2: 95%

## 2021-02-04 NOTE — Nursing Progress Note (Signed)
Pt left AMA @0703  MD NP, security called to bedside prior to that. Pt made multiple threats to staff about explosive personality and that he was going to "punch someone or something"

## 2021-02-04 NOTE — ED Notes (Signed)
PT refusing tylneol, states he takes 4 extra strength tylenol daily and last dose was 3-4 hours ago. MD aware and to speak to patient.

## 2021-02-05 ENCOUNTER — Emergency Department: Payer: Self-pay

## 2021-02-05 ENCOUNTER — Observation Stay
Admission: EM | Admit: 2021-02-05 | Discharge: 2021-02-05 | Disposition: A | Discharge status: Home / Self Care | Payer: Self-pay | Attending: Internal Medicine | Admitting: Internal Medicine

## 2021-02-05 DIAGNOSIS — R0602 Shortness of breath: Secondary | ICD-10-CM | POA: Insufficient documentation

## 2021-02-05 DIAGNOSIS — Z7982 Long term (current) use of aspirin: Secondary | ICD-10-CM | POA: Insufficient documentation

## 2021-02-05 DIAGNOSIS — R531 Weakness: Secondary | ICD-10-CM | POA: Insufficient documentation

## 2021-02-05 DIAGNOSIS — Z8673 Personal history of transient ischemic attack (TIA), and cerebral infarction without residual deficits: Secondary | ICD-10-CM | POA: Insufficient documentation

## 2021-02-05 DIAGNOSIS — R110 Nausea: Secondary | ICD-10-CM | POA: Insufficient documentation

## 2021-02-05 DIAGNOSIS — F17200 Nicotine dependence, unspecified, uncomplicated: Secondary | ICD-10-CM | POA: Insufficient documentation

## 2021-02-05 DIAGNOSIS — R42 Dizziness and giddiness: Secondary | ICD-10-CM | POA: Insufficient documentation

## 2021-02-05 DIAGNOSIS — E7800 Pure hypercholesterolemia, unspecified: Secondary | ICD-10-CM | POA: Insufficient documentation

## 2021-02-05 DIAGNOSIS — Z79899 Other long term (current) drug therapy: Secondary | ICD-10-CM | POA: Insufficient documentation

## 2021-02-05 DIAGNOSIS — I252 Old myocardial infarction: Secondary | ICD-10-CM | POA: Insufficient documentation

## 2021-02-05 DIAGNOSIS — I10 Essential (primary) hypertension: Secondary | ICD-10-CM | POA: Insufficient documentation

## 2021-02-05 DIAGNOSIS — I2510 Atherosclerotic heart disease of native coronary artery without angina pectoris: Secondary | ICD-10-CM | POA: Insufficient documentation

## 2021-02-05 DIAGNOSIS — I498 Other specified cardiac arrhythmias: Secondary | ICD-10-CM

## 2021-02-05 DIAGNOSIS — R079 Chest pain, unspecified: Principal | ICD-10-CM | POA: Insufficient documentation

## 2021-02-05 LAB — ECG 12-LEAD
Atrial Rate: 72 {beats}/min
Atrial Rate: 91 {beats}/min
Atrial Rate: 82 {beats}/min
Atrial Rate: 91 {beats}/min
IHS MUSE NARRATIVE AND IMPRESSION: NORMAL
IHS MUSE NARRATIVE AND IMPRESSION: NORMAL
P Axis: 9 degrees
P Axis: 43 degrees
P Axis: 44 degrees
P Axis: 75 degrees
P-R Interval: 118 ms
P-R Interval: 108 ms
P-R Interval: 108 ms
P-R Interval: 136 ms
Q-T Interval: 388 ms
Q-T Interval: 354 ms
Q-T Interval: 366 ms
Q-T Interval: 364 ms
QRS Duration: 100 ms
QRS Duration: 96 ms
QRS Duration: 90 ms
QRS Duration: 90 ms
QTC Calculation (Bezet): 435 ms
QTC Calculation (Bezet): 427 ms
QTC Calculation (Bezet): 447 ms
R Axis: 28 degrees
R Axis: 77 degrees
R Axis: 69 degrees
R Axis: 73 degrees
T Axis: 22 degrees
T Axis: 52 degrees
T Axis: 40 degrees
T Axis: 49 degrees
Ventricular Rate: 91 {beats}/min
Ventricular Rate: 82 {beats}/min
Ventricular Rate: 91 {beats}/min

## 2021-02-05 LAB — CBC AND DIFFERENTIAL
Absolute NRBC: 0 10*3/uL (ref 0.00–0.00)
Basophils Absolute Automated: 0.02 10*3/uL (ref 0.00–0.08)
Basophils Automated: 0.4 %
Eosinophils Absolute Automated: 0.2 10*3/uL (ref 0.00–0.44)
Eosinophils Automated: 4 %
Hematocrit: 38.4 % (ref 37.6–49.6)
Hgb: 12.8 g/dL (ref 12.5–17.1)
Immature Granulocytes Absolute: 0.01 10*3/uL (ref 0.00–0.07)
Immature Granulocytes: 0.2 %
Lymphocytes Absolute Automated: 1.1 10*3/uL (ref 0.42–3.22)
Lymphocytes Automated: 21.8 %
MCH: 25.8 pg (ref 25.1–33.5)
MCHC: 33.3 g/dL (ref 31.5–35.8)
MCV: 77.4 fL — ABNORMAL LOW (ref 78.0–96.0)
MPV: 9.1 fL (ref 8.9–12.5)
Monocytes Absolute Automated: 0.68 10*3/uL (ref 0.21–0.85)
Monocytes: 13.5 %
Neutrophils Absolute: 3.04 10*3/uL (ref 1.10–6.33)
Neutrophils: 60.1 %
Nucleated RBC: 0 /100 WBC (ref 0.0–0.0)
Platelets: 338 10*3/uL (ref 142–346)
RBC: 4.96 10*6/uL (ref 4.20–5.90)
RDW: 17 % — ABNORMAL HIGH (ref 11–15)
WBC: 5.05 10*3/uL (ref 3.10–9.50)

## 2021-02-05 LAB — COMPREHENSIVE METABOLIC PANEL
ALT: 26 U/L (ref 0–55)
AST (SGOT): 18 U/L (ref 5–41)
Albumin/Globulin Ratio: 1.6 (ref 0.9–2.2)
Albumin: 3.9 g/dL (ref 3.5–5.0)
Alkaline Phosphatase: 130 U/L — ABNORMAL HIGH (ref 37–117)
Anion Gap: 9 (ref 5.0–15.0)
BUN: 12 mg/dL (ref 9.0–28.0)
Bilirubin, Total: 0.4 mg/dL (ref 0.2–1.2)
CO2: 22 mEq/L (ref 17–29)
Calcium: 9.3 mg/dL (ref 8.5–10.5)
Chloride: 106 mEq/L (ref 99–111)
Creatinine: 0.7 mg/dL (ref 0.5–1.5)
Globulin: 2.5 g/dL (ref 2.0–3.6)
Glucose: 160 mg/dL — ABNORMAL HIGH (ref 70–100)
Potassium: 4.3 mEq/L (ref 3.5–5.3)
Protein, Total: 6.4 g/dL (ref 6.0–8.3)
Sodium: 137 mEq/L (ref 135–145)

## 2021-02-05 LAB — TROPONIN I
Troponin I: <0.01 ng/mL (ref 0.00–0.05)
Troponin I: <0.01 ng/mL (ref 0.00–0.05)

## 2021-02-05 LAB — GFR: EGFR: >60

## 2021-02-05 MED ORDER — HYDROMORPHONE HCL 1 MG/ML IJ SOLN
1.0000 mg | Freq: Once | INTRAMUSCULAR | Status: DC
Start: 2021-02-05 — End: 2021-02-05

## 2021-02-05 MED ORDER — ACETAMINOPHEN 325 MG PO TABS
650.0000 mg | ORAL_TABLET | Freq: Four times a day (QID) | ORAL | Status: DC | PRN
Start: 2021-02-05 — End: 2021-02-05

## 2021-02-05 MED ORDER — IOHEXOL 350 MG/ML IV SOLN
100.0000 mL | Freq: Once | INTRAVENOUS | Status: AC | PRN
Start: 2021-02-05 — End: 2021-02-05
  Administered 2021-02-05: 100 mL via INTRAVENOUS

## 2021-02-05 MED ORDER — ONDANSETRON HCL 4 MG/2ML IJ SOLN
4.0000 mg | Freq: Once | INTRAMUSCULAR | Status: AC
Start: 2021-02-05 — End: 2021-02-05
  Administered 2021-02-05: 4 mg via INTRAVENOUS
  Filled 2021-02-05: qty 2

## 2021-02-05 MED ORDER — MORPHINE SULFATE 4 MG/ML IJ/IV SOLN (WRAP)
4.0000 mg | Freq: Once | Status: AC
Start: 2021-02-05 — End: 2021-02-05
  Administered 2021-02-05: 4 mg via INTRAVENOUS
  Filled 2021-02-05: qty 1

## 2021-02-05 MED ORDER — ASPIRIN 81 MG PO TBEC
81.0000 mg | DELAYED_RELEASE_TABLET | Freq: Every day | ORAL | Status: DC
Start: 2021-02-05 — End: 2021-02-05
  Administered 2021-02-05: 81 mg via ORAL
  Filled 2021-02-05: qty 1

## 2021-02-05 MED ORDER — ATORVASTATIN CALCIUM 40 MG PO TABS
40.0000 mg | ORAL_TABLET | Freq: Every evening | ORAL | Status: DC
Start: 2021-02-05 — End: 2021-02-05

## 2021-02-05 MED ORDER — ACETAMINOPHEN 325 MG PO TABS
650.0000 mg | ORAL_TABLET | Freq: Four times a day (QID) | ORAL | Status: DC | PRN
Start: 2021-02-05 — End: 2021-02-05
  Administered 2021-02-05: 650 mg via ORAL
  Filled 2021-02-05: qty 2

## 2021-02-05 MED ORDER — NITROGLYCERIN 0.4 MG SL SUBL
0.4000 mg | SUBLINGUAL_TABLET | SUBLINGUAL | Status: DC | PRN
Start: 2021-02-05 — End: 2021-02-05

## 2021-02-05 NOTE — Nursing Progress Note (Signed)
Pt alert and oriented x4, c/o 7/10 chest pain. Vital signs stable, EKG obtained and WNL, troponins negative x2. Pt appears comfortable at rest, no signs of pain noted. MD Benard Halsted made aware.     Pt refused bedside RN assessment, refused 2 RN skin check.     Pt discharged home with paperwork, all belongings returned to patient. Pt refused to sign or let this RN review discharge paperwork.

## 2021-02-05 NOTE — Progress Notes (Signed)
CASE MANAGEMENT PROGRESS NOTE        Patient: [name] [name]  Admission Date: 12/14/2012 12:52 PM    Active Hospital Problems    Diagnosis    Chest pain     Length of stay: Hospital Day 0    Bedside nurse informed this CM pt discharged w/ no CM d/c planning needs.     02/05/21 1000   Discharge Disposition   Patient preference/choice provided? N/A   Physical Discharge Disposition Home   Mode of Transportation Car   CM Interventions   Follow up appointment scheduled?   (see AVS)   Multidisciplinary rounds/family meeting before d/c? No   Medicare Checklist   Is this a Medicare patient?   (Unknown, pt d/c'd prior to clarifying insurance)       Marge Duncans RN, BSN, CCM  Case Manager  [facility]  [phone]

## 2021-02-05 NOTE — Progress Notes (Signed)
2 staff member skin assessment (within 4 hours of admission) completed with: Need reinforcement  CHG bath: Refused  Clinical cytogeneticist signed by patient/staff: Needs Reinforcement  Admission packet reviewed with patient/family: Completed  Reviewed patient's belongings and home medications: Completed   Surveillance MRSA culture (SNF/Rehab/History/Admitted from OSH/Chronic HD): N/A  Present central line/foley evaluated: N/A  Arm bands applied: Yes  Interpreter request or waiver completed: N/A  COVID19 Assessment complete:Yes      Educated about CHG bath and bed alarm importance but patient refused because he stated he flips and turn a lot in bed and it will alarm. Also refused non skid socks per patient he will not walk and will stay in bed. Re educated to call RN before standing up and wear non skid socks.    Pt is in bed lying, bed in lowest position, call bell and side table within pt's reach. Safety and Fall precautions maintained. Pt's needs addressed and questions answered.

## 2021-02-05 NOTE — EDIE (Signed)
COLLECTIVE?NOTIFICATION?02/05/2021 04:13?[name] [name], [name] [name]?MRN: [id number]    Criteria Met      History of Opioid Overdose (12 mo.)    3 Different Facilities in 90 Days    History of Suicide Ideation or Self-Harm (12 mo.)    5 ED Visits in 12 Months    Security and Safety  No Security Events were found.  ED Care Guidelines  There are currently no ED Care Guidelines for this patient. Please check your facility's medical records system.        Prescription Monitoring Program  000??- Narcotic Use Score  000??- Sedative Use Score  000??- Stimulant Use Score  000??- Overdose Risk Score  - All Scores range from 000-999 with 75% of the population scoring < 200 and on 1% scoring above 650  - The last digit of the narcotic, sedative, and stimulant score indicates the number of active prescriptions of that type  - Higher Use scores correlate with increased prescribers, pharmacies, mg equiv, and overlapping prescriptions  - Higher Overdose Risk Scores correlate with increased risk of unintentional overdose death   Concerning or unexpectedly high scores should prompt [name] review of the PMP record; this does not constitute checking PMP for prescribing purposes.    E.D. Visit Count (12 mo.)  Facility Visits   [facility] 1   [facility] 1   [facility] 2   [facility] ([facility] 1   [facility] 1   [address] ([place]) 1   [employer] [facility] 1   [facility] 1   [facility] 1   [facility] ([facility] 2   [facility] 1   [facility] 1   [facility] 1   [facility] 1   [facility] [place] 2   [facility] [place] 1   [address] [facility] ([place]) (CCD Exch.) 1   [facility] 3   [facility] 1   [facility] 1   [facility] 1   [facility] 1   [facility] 1   [facility] 1   [facility] 2   [facility] 1   [facility] 1   [facility] 1   [facility] 1   [facility] 1   [facility] [place] 1   [facility] ([facility] 2   [facility] 1   [facility] 1   [facility] 2   [facility] 1   [address] - [place] [facility] [place] 2   [facility] 1   [facility] 1   [facility] 3   [facility] 2   [place] [facility] [place] (CCD Exch.) 2   [place] 1   [facility] 1   [facility] 1   [facility] 1   [facility] 1   [facility] ([facility] 1   [facility]der -  [employer] 1   [facility] 2   [facility] 1   [facility] 1   [facility] 2   Total 68   Note: Visits indicate total known visits.     Recent Emergency Department Visit Summary  Showing 10 most recent visits out of 67 in the past 12 months   Date Facility [facility] Type Diagnoses or Chief Complaint    Feb 05, 2021  [place] [facility] [place] [facility]  [place]  Emergency      Triage      Feb 04, 2021   [facility] [place] [facility]  [place]  Emergency      chest pain      Syncope and collapse      Chest pain, unspecified      Feb 03, 2021  [facility]  [place]  Emergency      abdominal pain      Unspecified abdominal pain      Constipation, unspecified      Jan 18, 2021  [facility] [place]  [place]  Emergency     Jan 17, 2021  [facility]  [place]  Emergency      Dizziness and giddiness      Other chest pain      Chronic obstructive pulmonary disease, unspecified      Atherosclerotic heart disease  of native coronary artery without angina pectoris      Essential (primary) hypertension      Tobacco use      Jan 16, 2021  [facility] [place]  Emergency      stroke symptoms      Weakness      Jan 15, 2021  [facility] [place]  Emergency      Auditory hallucinations - walked into police station and asked for help. BIBA      SI Auditory hallucinations (command)- walked into police station and asked for help. 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 Emergency Department  [place]  Emergency      Chest pain, unspecified      Jan 10, 2021  [facility]  [place]  Emergency  Chief Complaint: "LOWER ABD PAIN" AS PER PT    Dec 06, 2020  [facility] [place]  [place]  Emergency      STROKE        Recent Inpatient Visit Summary  Showing 10 most recent visits out of 12 in the past 12 months   Date Facility [facility] Type Diagnoses or Chief Complaint    Jan 15, 2021  [facility] [place]  Inpatient      Antisocial personality disorder      Homelessness unspecified      SI Auditory hallucinations (command)- walked into police station and asked for help. 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  [place]  Medical Surgical      Cerebral infarction, unspecified      Nov 27, 2020  [place] [facility] [place]  [place]  Neurology      Extremity Weakness      Aphasia      Sep 04, 2020  [facility] [place]  [place]  Inpatient      0. CVA      1. Hemiplegia, unspecified affecting left nondominant side      2.  Migraine, unspecified, not intractable, without status migrainosus      2. Atherosclerotic heart disease of native coronary artery without angina pectoris      2. Anemia, unspecified      2. Allergy status to narcotic agent      2. Allergy status to other drugs, medicaments and biological substances      2. Schizoaffective disorder, unspecified      2. Cardiac murmur, unspecified      2. NIHSS score 10      Aug 31, 2020  [facility].  [place]  Telemetry      STROKE      Aug 13, 2020  [facility] [place] [facility]  [place]  Medical Surgical      0. CVA TPA      1. Cerebral infarction, unspecified      2. Hemiplegia, unspecified affecting left nondominant side      2. Hyperlipidemia, unspecified      2. NIHSS score 12      2. Pure hypercholesterolemia, unspecified      2. Weakness      2. Essential (primary) hypertension      2. Allergy status to narcotic agent      2. Facial weakness      Jul 20, 2020  [facility].  [place]  Medical Surgical      0. Cerebral infarction, unspecified      0. Muscle weakness (generalized)      0. Bell's palsy      1. 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 Slurred speech      2. Aphasia      3. Atherosclerotic heart disease of native coronary artery without angina pectoris      4. Essential (primary) hypertension      5. Hyperlipidemia, unspecified      6. Mental disorder, not otherwise specified      Jul 14, 2020  [facility] [employer] [address]  [place]  Medical Surgical      0. STROKE S/P TPA      1. Cerebral infarction, unspecified      2. Long term (current) use of aspirin      2. Status post administration of tPA (rtPA) in [name] different facility within the last 24 hours prior to admission to current facility      2. Hemiplegia and hemiparesis following cerebral infarction affecting left non-dominant side      2. Atherosclerotic heart disease of native coronary artery without angina pectoris      2. Nicotine dependence,  cigarettes, uncomplicated      2. Essential (primary) hypertension      2. Unvaccinated for COVID-19  2. Immunization not carried out for unspecified reason      May 27, 2020  [facility]  [place]  Inpatient      0. Weakness      1. Cerebral infarction, unspecified      2. Hemiplegia, unspecified affecting unspecified side      3. Other cerebral infarction      4. CONTACT WITH AND (SUSPECTED) EXPOSURE TO COVID-19      5. Hemiplegia, unspecified affecting left nondominant side      6. NIHSS score 13      7. Essential (primary) hypertension      8. Atherosclerotic heart disease of native coronary artery without angina pectoris      9. Migraine, unspecified, not intractable, without status migrainosus      May 23, 2020  [facility] [place] [facility]  [place]  Medical Surgical      0. CHEST PAIN WITH TIA STROKE      0. L SIDED WEAKNESS      1. Chest pain, unspecified      2. Allergy status to narcotic agent      2. Weakness      2. Post-traumatic stress disorder, unspecified      2. Pure hypercholesterolemia, unspecified      2. Hyperlipidemia, unspecified      2. Procedure and treatment not carried out because of patient's decision for other reasons      2. Nicotine dependence, cigarettes, uncomplicated        Care Team  Provider Specialty Phone Fax Service Dates   Buzzy Han , MD Family Medicine [phone]  Apr 17, 2018 - Current    [facility], [place] , D.O. Internal Medicine   Current    Nelida Meuse, [place], [facility], [facility] Counselor: Professional [phone]  Current    Katharine Look, Florestine Avers., LMFT Marriage and Family Therapist [phone] Current      Collective Portal  This patient has registered at the [facility] Emergency Department   For more information visit: [email or web address]     PLEASE NOTE:     1.   Any care recommendations and other clinical information are provided as guidelines or for historical  purposes only, and providers should exercise their own clinical judgment when providing care.    2.   You may only use this information for purposes of treatment, payment or health care operations activities, and subject to the limitations of applicable Collective Policies.    3.   You should consult directly with the organization that provided [name] care guideline or other clinical history with any questions about additional information or accuracy or completeness of information provided.    ? 2022 [place], [employer] - [email or web address]

## 2021-02-05 NOTE — Discharge Summary (Signed)
DISCHARGE NOTE    Date Time: 02/05/21 11:47 AM  Patient Name: [name] [name]  Attending Physician: No att. providers found    Date of Admission:   02/05/2021    Date of Discharge:   today    Reason for Admission:   Chest pain, unspecified type [R07.9]    Problems:   Lists the present on admission hospital problems  Present on Admission:   Chest pain, unspecified type      Hospital Problems:  Principal Problem:    Chest pain, unspecified type      Problem Lists:  Patient Active Problem List   Diagnosis    Chest pain    CAD (coronary artery disease)    High cholesterol    Hypertension    Chest pain, unspecified type     Hospital Course:   CP  Hx of CAD with MI treated medically  Possible Afib  Tobacco use  HLD  HTN essential  CVA  TBI  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  Of note he was in the hospital yesterday for same but AMA'ed.  Between the 2 admissions multiple neg trops and EKGs.  Of note prior to yesterday's visit he had an episode of syncope, unwitnessed, isolated event.  No events on tele during stay.  CTA neg for PE or dissection.  Conflicting info about whether or not he has Afib; pt denies but ED documentation says he does.  Today he declined any further treatment or workup owing to feeling better.  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  Due to hx of CAD will refer for outpt cards f/u.  Cont home asa and statin and nitro.  As his w/u is neg so far and he does not wish to stay longer I do not have any strong case to keep him longer and will [place] home.    Exam:  GENERAL: AAOx 3. No acute distress. Well-nourished.  EYES: EOMI. Anicteric.  HENT: Moist mucous membranes. No scleral icterus. No lymphadenopathy.  LUNGS: CTA B/L. No accessory muscle use.  CARDIOVASCULAR: Regular rate and rhythm. No murmur. No JVD.  ABDOMEN: Soft, non-tender and non-distended. No palpable  masses.  EXTREMITIES: No edema. Non-tender.  SKIN: No rashes or lesions. Warm.  NEUROLOGIC: No focal neurological deficits.    PSYCHIATRIC: Cooperative. Appropriate mood and affect.     >Time spent discharging pt: greater than 30 mins.  Pt understands to return to ED immediately should symptoms worsen or not completely resolve.  I advised the pt or the pt's listed family member not to operate machinery or weapons or drive motor vehicles for the duration of the prescribed course of any sedatives including benzos and opiates and anti histamines and muscle relaxants, and the pt or listed family member verbalized understanding.  Pt or listed family member were explained potential serious bleeding risk and death from bleeding with any blood thinners received in the hospital or after discharge and they verbalized understanding of inherent risk and agreed with blood thinners given that benefits outweight risk.  Pt has been advised not to operate machinery or motor vehicles until cleared to do so by the primary care physician or another physician.         Discharge Medications:     Discharge Medication List as of 02/05/2021  9:34 AM        CONTINUE these  medications which have NOT CHANGED    Details   aspirin 81 MG chewable tablet Chew 81 mg by mouth daily, Historical Med      atorvastatin (LIPITOR) 20 MG tablet Take 20 mg by mouth daily, Historical Med      lisinopril (PRINIVIL,ZESTRIL) 20 MG tablet Take 20 mg by mouth daily., Until Discontinued, Historical Med      nitroglycerin (NITROSTAT) 0.4 MG SL tablet Place 0.4 mg under the tongue every 5 (five) minutes as needed., Until Discontinued, Historical Med      oxyCODONE-acetaminophen (PERCOCET) 5-325 MG per tablet Take 1 tablet by mouth every 4 (four) hours as needed for Pain, Historical Med               Discharge Instructions:   Carefully review your medication changes.  See your primary care doctor in one week.

## 2021-02-05 NOTE — UM Notes (Signed)
PATIENT NAME: [name] [name], [name] [name]   DOB: [birth year]   PMH:  has a past medical history of CAD (coronary artery disease), High cholesterol, and Hypertension.   PSH:  has a past surgical history that includes Brain surgery and Back surgery.     02/05/21 0540  Adult Admit to Observation        ADMISSION REVIEW   History of present illness: Pt is a 50 y.o. male arrived at [facility] on 02/05/2021 at 0437.  Chief Complaint: Chest Pain  [name] [name] is a 50 y.o. male with PMHx of HTN, hypercholesterolemia, CAD, and with a history of 2 prior strokes and 2 MIs presents with acute onset of radiating left lateral chest pain. Patient endorses 1 hour onset of left-sided chest pain that radiates to his left arm, leg shoulder, lateral side of his neck and jaw. Patient was last seen in this ED yesterday for similar chest pain rated 8/10. EMS administered 3 doses of nitro with minimal relief.  admitted, but he departed AMA. Patient work up included: EKG NSR, H/H 11.1/24.8, ALP 122, negative troponin, and CXR with borderline heart size and mild underlying interstitial markings. Patient left AMA. He now presents again d/t recurrent onset of similar CP. He states he left out of ignorance yesterday after a flare up of his IED. He also admits to left leg weakness. Pt only admits to marijuana use, and denies any EtOh. Sudden onset of these symptoms prompted his visit to the ED for emergent care and re-evaluation.     Arrival VS: Vitals BP: 138/86, Temp: 98.2 F (36.8 C), Temp Source: Oral, Heart Rate: (!) 119, Resp Rate: 20, SpO2: 99 %, Height: 185.4 cm (6\' 1" ), Weight: 79.4 kg (175 lb)       Abnormal Labs:   02/05/21 04:34   Glucose 160 (H)   Alkaline Phosphatase 130 (H)      Medications in ED: morphine IV x1, zofran IVx 1    Place under Observation Services, Dx Chest pain, unspecified type [R07.9]  Cardiology consult     Scheduled Meds:  Current Facility-Administered Medications   Medication Dose Route Frequency    aspirin EC  81 mg  Oral Daily    atorvastatin  40 mg Oral QHS      Primary Payor: /   UTILIZATION REVIEW CONTACT: Name: Alfonso Patten, RN, MSN, CCM, ACM  Utilization Review  [facility]  Address:  [address] [place], [place]  [address]  NPI:   [id number]  Tax ID:  [id number]  Phone: [phone] (Voice Mail Only)  Fax: [phone]  Email: [email or web address]    Please use fax number [phone] to provide authorization for hospital services or to request additional information.        NOTES TO REVIEWER:    This clinical review is based on/compiled from documentation provided by the treatment team within the patients medical record.

## 2021-02-05 NOTE — ED Notes (Signed)
[facility] HOSPITAL EMERGENCY DEPT  ED NURSING NOTE FOR THE RECEIVING INPATIENT NURSE   ED NURSE [place] [id number]   ED CHARGE RN Toma Copier   ADMISSION INFORMATION   [name] [name] is a 50 y.o. male admitted with an ED diagnosis of:    1. Chest pain, unspecified type         Isolation: None   Allergies: Toradol [ketorolac tromethamine] and Ultram [tramadol hcl]   Holding Orders confirmed? No   Belongings Documented? Yes   Home medications sent to pharmacy confirmed? N/A   NURSING CARE   Patient Comes From:   Mental Status: Home Independent  alert and oriented   ADL: Independent with all ADLs   Ambulation: no difficulty   Pertinent Information  and Safety Concerns:     [place] Violence Risk Level: Low pt ambulatory to triage c/o chest discomfort since yesterday, reports shortness of breath at rest, pt reports had similar pain in 2015 when he had MI       CT / NIH   CT Head ordered on this patient?  No   NIH/Dysphagia assessment done prior to admission? N/A   VITAL SIGNS (at the time of this note)      Vitals:    02/05/21 0530   BP: 123/64   Pulse: 77   Resp:    Temp:    SpO2: 97%

## 2021-02-05 NOTE — ED Provider Notes (Signed)
[facility] EMERGENCY DEPARTMENT H&P           CLINICAL INFORMATION        HPI:      Chief Complaint: Chest Pain  .    [name] [name] is a 50 y.o. male with PMHx of HTN, hypercholesterolemia, CAD, and with a history of 2 prior strokes and 2 MIs presents with acute onset of radiating left lateral chest pain. Patient endorses 1 hour onset of left-sided chest pain that radiates to his left arm, leg shoulder, lateral side of his neck and jaw. Patient was last seen in this ED yesterday for similar chest pain rated 8/10. EMS administered 3 doses of nitro with minimal relief.  admitted, but he departed AMA. Patient work up included: EKG NSR, H/H 11.1/24.8, ALP 122, negative troponin, and CXR with borderline heart size and mild underlying interstitial markings. Patient left AMA. He now presents again d/t recurrent onset of similar CP. He states he left out of ignorance yesterday after a flare up of his IED. He also admits to left leg weakness. Pt only admits to marijuana use, and denies any EtOh. Sudden onset of these symptoms prompted his visit to the ED for emergent care and re-evaluation.     History obtained from: Patient    Triage Note: Ambulatory to ED c/o left chest pain that radiates to left neck x 1 hour. Pt reports he was seen for the same yesterday and left AMA after an argument with the nurses. Endorses sob, dizziness, and nausea. Pt hx previous hx of MI and CVA (02/05/21 0416)    ROS:      Pertinent positive and negative review of systems elements noted in the HPI.  All other systems reviewed and negative.        Physical Exam:      Pulse (!) 119   BP 138/86   Resp 20   SpO2 99 %   Temp 98.2 F (36.8 C)    Vitals:    02/05/21 0414 02/05/21 0416   BP:  138/86   Pulse: (!) 119 (!) 104   Resp: 20 20   Temp:  98.2 F (36.8 C)   SpO2: 99% 98%   Weight:  79.4 kg   Height:  6\' 1"  (1.854 m)       Nursing notes and vitals reviewed.     Constitutional: alert, no acute  distress  Eyes: EOM intact, conjugate gaze, vision grossly intact  HEENT: Neck supple with normal ROM   Cardiac: Warm and well perfused, normal S1 and S2, no murmurs noted  Respiratory: Normal rate, No increased work of breathing, clear to auscultation bilaterally  Abdomen/GI: soft and nondistended, nontender no guarding or rebound, no CVA tenderness   Neurologic: alert, oriented, conversant, moves all extremities with 5/5 strength bilaterally, sensation grossly intact, stable gait  MSK: No deformities or crepitus, full ROM of joints without pain or limitation  Skin: warm and dry, no obvious rashes  Psychiatric: pleasant and cooperative, linear organized thought process.              PAST HISTORY        Primary Care Provider: Pcp, None, MD        PMH/PSH:    .     Past Medical History:   Diagnosis Date    CAD (coronary artery disease)     High cholesterol     Hypertension        He has a past surgical history  that includes Brain surgery and Back surgery.      Social/Family History:      He reports that he has been smoking. He does not have any smokeless tobacco history on file. He reports that he does not drink alcohol and does not use drugs.    No family history on file.      Listed Medications on Arrival:    .     Home Medications               aspirin EC 81 MG EC tablet     Take 81 mg by mouth daily.     atorvastatin (LIPITOR) 10 MG tablet     Take 10 mg by mouth daily.     lisinopril (PRINIVIL,ZESTRIL) 20 MG tablet     Take 20 mg by mouth daily.     nitroglycerin (NITROSTAT) 0.4 MG SL tablet     Place 0.4 mg under the tongue every 5 (five) minutes as needed.           Allergies: He is allergic to toradol [ketorolac tromethamine] and ultram [tramadol hcl].            VISIT INFORMATION        Medications Given in the ED:    .     ED Medication Orders (From admission, onward)      None              Procedures:      Procedures      Interpretations:      O2 sat-           saturation: 99 %; Oxygen use: room air;  Interpretation: Normal      EKG interpreted by me:  Rhythm: Sinus  Rate: 91  Intervals: Normal  Axis: Normal  Ectopy: None  ST changes: No acute ischemic changes                RESULTS        Lab Results:      Results       Procedure Component Value Units Date/Time    Comprehensive metabolic panel [[id number]] Collected: 02/05/21 0434    Specimen: Blood Updated: 02/05/21 0434    Troponin I [[id number]] Collected: 02/05/21 0434    Specimen: Blood Updated: 02/05/21 0434    CBC and differential [[id number]] Collected: 02/05/21 0434    Specimen: Blood Updated: 02/05/21 0434                Radiology Results:      No orders to display              Visit date: 02/05/2021      CLINICAL SUMMARY           Diagnosis:    .     Final diagnoses:   Chest pain, unspecified type         MDM/ED Course:      50 year old with recurrent chest pain.  Had improved after discharge, now recurrent.  Ongoing for about an hour.  Radiates to back.    DDX    Ddx includes ACS, PE, pneumonia, pneumothorax, pericarditis, GERD/esophageal spasm, chest wall pain, pleurisy    Aortic dissection - unlikely based on stable presentation, lack of radiation, "tearing" quality, neuro symptoms.   Myocarditis - unlikely with no signs of heart failure, EKG changes, fever.    CTA unrevealing.    EKG with no obvious ischemic changes, troponin negative.  Patient high  risk based on his previous history.  Patient calm and agreeable to admission.  Admitted in stable condition.    Re-evaluation    Pain improved                        Disposition:      Admit              Scribe Attestation:      I was acting as a Neurosurgeon for [name] Feinstein, MD on [name] [name]  Treatment Team: Scribe: [name] [name]    I am the first provider for this patient and I personally performed the services documented. Treatment Team: Scribe: [name], [name] [name] is scribing for me on [name] [name]. This note accurately reflects work and decisions made by me.  [name] Feinstein, MD         Parts of this note were generated by the Epic EMR system/ Dragon speech recognition and may contain inherent errors or omissions not intended by the user. Grammatical errors, random word insertions, deletions, pronoun errors and incomplete sentences are occasional consequences of this technology due to software limitations. Not all errors are caught or corrected. If there are questions or concerns about the content of this note or information contained within the body of this dictation they should be addressed directly with the author for clarification.

## 2021-02-05 NOTE — Discharge Instr - AVS First Page (Addendum)
Reason for your Hospital Admission:  Chest pain      Instructions for after your discharge:  See the cardiologist

## 2021-02-13 LAB — BMP (EXT)
Anion Gap (EXT): 11 meq/L (ref 6–18)
BUN (EXT): 12 mg/dL (ref 7–18)
CO2 (EXT): 24 mmol/L (ref 22–29)
CalciumCalcium (EXT): 9 mg/dL (ref 8.5–10.1)
Chloride (EXT): 105 mmol/L (ref 98–107)
Creatinine (EXT): 0.8 mg/dL (ref 0.7–1.2)
Glucose (EXT): 93 mg/dL (ref 74–109)
Osmolality (EXT): 279 mosm/L (ref 275–295)
Potassium (EXT): 4.1 mmol/L (ref 3.5–5.1)
Sodium (EXT): 140 mmol/L (ref 136–145)
eGFR - Creat CKD-EPI (EXT): 109 mL/min/{1.73_m2} (ref >=60)

## 2021-02-14 LAB — CMP (EXT)
A/G Ratio (EXT): 1.5 (ref >1.0)
ALT/SGPT (EXT): 19 U/L (ref 0–55)
AST/SGOT (EXT): 18 U/L (ref 5–34)
Albumin (EXT): 3.9 g/dL (ref 3.5–5.2)
Alkaline Phosphatase (EXT): 128 U/L (ref 40–150)
Anion Gap (EXT): 13.2 (ref 10–20)
BUN (EXT): 12 mg/dL (ref 9–21)
Bilirubin, Total (EXT): 0.3 mg/dL (ref 0.2–1.2)
CO2 (EXT): 22 mmol/L (ref 22–29)
CalciumCalcium (EXT): 9 mg/dL (ref 8.4–10.2)
Chloride (EXT): 108 mmol/L — ABNORMAL HIGH (ref 98–107)
Creatinine (EXT): 0.6 mg/dL (ref 0.3–1.5)
Glucose (EXT): 102 mg/dL — ABNORMAL HIGH (ref 74–100)
Potassium (EXT): 4.2 mmol/L (ref 3.5–5.1)
Protein (EXT): 6.5 g/dL (ref 6.0–8.3)
Sodium (EXT): 139 mmol/L (ref 136–145)
eGFR - Creat MDRD (EXT): 152 mL/min/{1.73_m2} (ref >=60)
eGFR - Creat MDRD (EXT): 184 mL/min/{1.73_m2} (ref >=60)

## 2021-02-14 LAB — HEPATITIS C ANTIBODY (EXT): HEPATITIS C ANTIBODY (EXT): NONREACTIVE

## 2021-02-16 LAB — BMP (EXT)
Anion Gap (EXT): 14 mmol/L (ref 6–14)
BUN (EXT): 16 mg/dL (ref 6–20)
CO2 (EXT): 23 mmol/L (ref 21–30)
CalciumCalcium (EXT): 9.2 mg/dL (ref 8.6–10.0)
Chloride (EXT): 99 mmol/L (ref 98–107)
Creatinine (EXT): 0.55 mg/dL — ABNORMAL LOW (ref 0.70–1.20)
Glucose (EXT): 102 mg/dL — ABNORMAL HIGH (ref 70–100)
Potassium (EXT): 4.1 mmol/L (ref 3.3–4.8)
Sodium (EXT): 136 mmol/L (ref 136–145)
eGFR - Creat CKD-EPI (EXT): 121 mL/min/1.73m (ref >=60)

## 2021-02-18 LAB — EGFR (EXT): eGFR - Creat CKD-EPI (EXT): 116 mL/min/{1.73_m2} (ref >=60)

## 2021-02-18 LAB — BMP (EXT)
Anion Gap (EXT): 5 (ref 3–12)
BUN (EXT): 17 mg/dL (ref 8–20)
CO2 (EXT): 25 mmol/L (ref 22–32)
CalciumCalcium (EXT): 8.6 mg/dL — ABNORMAL LOW (ref 8.9–10.3)
Chloride (EXT): 107 mmol/L (ref 101–111)
Creatinine (EXT): 0.65 mg/dL (ref 0.64–1.27)
Glucose (EXT): 99 mg/dL (ref 70–99)
Potassium (EXT): 4.5 mmol/L (ref 3.6–5.1)
Sodium (EXT): 137 mmol/L (ref 136–144)

## 2021-02-23 ENCOUNTER — Inpatient Hospital Stay: Admit: 2021-02-23 | Discharge: 2021-02-24 | Payer: PRIVATE HEALTH INSURANCE | Attending: Emergency Medicine

## 2021-02-23 ENCOUNTER — Emergency Department: Admit: 2021-02-23 | Payer: PRIVATE HEALTH INSURANCE

## 2021-02-23 MED ORDER — SODIUM CHLORIDE 0.9 % BOLUS (NEW BAG)
0.9 % | Freq: Once | INTRAVENOUS | Status: CP
Start: 2021-02-23 — End: ?
  Administered 2021-02-24: 04:00:00 0.9 mL/h via INTRAVENOUS

## 2021-02-23 MED ORDER — SODIUM CHLORIDE 0.9 % LARGE VOLUME SYRINGE FOR AUTOINJECTOR
Freq: Once | INTRAVENOUS | Status: CP | PRN
Start: 2021-02-23 — End: ?
  Administered 2021-02-24: 04:00:00 via INTRAVENOUS

## 2021-02-23 MED ORDER — MORPHINE 4 MG/ML INTRAVENOUS SOLUTION
4 mg/mL | Freq: Once | INTRAVENOUS | Status: CP
Start: 2021-02-23 — End: ?
  Administered 2021-02-24: 04:00:00 4 mL via INTRAVENOUS

## 2021-02-23 MED ORDER — ALUMINUM-MAG HYDROXIDE-SIMETHICONE 200 MG-200 MG-20 MG/5 ML ORAL SUSP
200-200-205 mg/5 mL | Freq: Once | ORAL | Status: CP
Start: 2021-02-23 — End: ?
  Administered 2021-02-24: 05:00:00 200-200-20 mL via ORAL

## 2021-02-23 MED ORDER — IOHEXOL 350 MG IODINE/ML INTRAVENOUS SOLUTION
350 mg iodine/mL | Freq: Once | INTRAVENOUS | Status: CP | PRN
Start: 2021-02-23 — End: ?
  Administered 2021-02-24: 04:00:00 350 mL via INTRAVENOUS

## 2021-02-24 ENCOUNTER — Emergency Department: Admit: 2021-02-24 | Payer: PRIVATE HEALTH INSURANCE

## 2021-02-24 DIAGNOSIS — I10 Essential (primary) hypertension: Secondary | ICD-10-CM

## 2021-02-24 DIAGNOSIS — R059 Cough, unspecified: Secondary | ICD-10-CM

## 2021-02-24 DIAGNOSIS — Z720 Tobacco use: Secondary | ICD-10-CM

## 2021-02-24 DIAGNOSIS — Z885 Allergy status to narcotic agent status: Secondary | ICD-10-CM

## 2021-02-24 DIAGNOSIS — I2510 Atherosclerotic heart disease of native coronary artery without angina pectoris: Secondary | ICD-10-CM

## 2021-02-24 DIAGNOSIS — R1031 Right lower quadrant pain: Secondary | ICD-10-CM

## 2021-02-24 DIAGNOSIS — Z886 Allergy status to analgesic agent status: Secondary | ICD-10-CM

## 2021-02-24 DIAGNOSIS — I252 Old myocardial infarction: Secondary | ICD-10-CM

## 2021-02-24 DIAGNOSIS — R110 Nausea: Secondary | ICD-10-CM

## 2021-02-24 DIAGNOSIS — Z79899 Other long term (current) drug therapy: Secondary | ICD-10-CM

## 2021-02-24 DIAGNOSIS — Z91048 Other nonmedicinal substance allergy status: Secondary | ICD-10-CM

## 2021-02-24 DIAGNOSIS — R062 Wheezing: Secondary | ICD-10-CM

## 2021-02-24 DIAGNOSIS — Z87820 Personal history of traumatic brain injury: Secondary | ICD-10-CM

## 2021-02-24 DIAGNOSIS — Z888 Allergy status to other drugs, medicaments and biological substances status: Secondary | ICD-10-CM

## 2021-02-24 DIAGNOSIS — Z7982 Long term (current) use of aspirin: Secondary | ICD-10-CM

## 2021-02-24 LAB — BMP (EXT)
Anion Gap (EXT): 11 (ref 7–17)
BUN (EXT): 19 mg/dL (ref 6–20)
BUN/CREAT Ratio (EXT): 23.8 — ABNORMAL HIGH (ref 8.0–23.0)
CO2 (EXT): 23 mmol/L (ref 20–30)
CalciumCalcium (EXT): 9.1 mg/dL (ref 8.8–10.2)
Chloride (EXT): 103 mmol/L (ref 98–107)
Creatinine (EXT): 0.8 mg/dL (ref 0.40–1.30)
Glucose (EXT): 123 mg/dL — ABNORMAL HIGH (ref 70–100)
Potassium (EXT): 4.5 mmol/L (ref 3.3–5.3)
Sodium (EXT): 137 mmol/L (ref 136–144)
eGFR - Creat CKD-EPI (EXT): >60 mL/min/1.73m2 (ref >=60)

## 2021-02-24 LAB — HEPATIC FUNCTION PANEL
BKR A/G RATIO: 1.7 (ref 1.0–2.2)
BKR ALANINE AMINOTRANSFERASE (ALT): 30 U/L (ref 9–59)
BKR ALBUMIN: 4.1 g/dL (ref 3.6–4.9)
BKR ALKALINE PHOSPHATASE: 139 U/L — ABNORMAL HIGH (ref 9–122)
BKR ASPARTATE AMINOTRANSFERASE (AST): 33 U/L (ref 10–35)
BKR AST/ALT RATIO: 1.1
BKR BILIRUBIN DIRECT: <0.2 mg/dL (ref <=0.3)
BKR BILIRUBIN TOTAL: 0.2 mg/dL (ref <=1.2)
BKR GLOBULIN: 2.4 g/dL (ref 2.3–3.5)
BKR PROTEIN TOTAL: 6.5 g/dL — ABNORMAL LOW (ref 6.6–8.7)

## 2021-02-24 LAB — CBC WITH AUTO DIFFERENTIAL
BKR WAM ABSOLUTE IMMATURE GRANULOCYTES.: 0.02 x 1000/ÂµL (ref 0.00–0.30)
BKR WAM ABSOLUTE LYMPHOCYTE COUNT.: 1.15 x 1000/ÂµL (ref 0.60–3.70)
BKR WAM ABSOLUTE NRBC (2 DEC): 0 x 1000/ÂµL (ref 0.00–1.00)
BKR WAM ANALYZER ANC: 4.66 x 1000/ÂµL (ref 2.00–7.60)
BKR WAM BASOPHIL ABSOLUTE COUNT.: 0.03 x 1000/ÂµL (ref 0.00–1.00)
BKR WAM BASOPHILS: 0.4 % (ref 0.0–1.4)
BKR WAM EOSINOPHIL ABSOLUTE COUNT.: 0.2 x 1000/ÂµL (ref 0.00–1.00)
BKR WAM EOSINOPHILS: 2.9 % (ref 0.0–5.0)
BKR WAM HEMATOCRIT (2 DEC): 36.1 % — ABNORMAL LOW (ref 38.50–50.00)
BKR WAM HEMOGLOBIN: 11.3 g/dL — ABNORMAL LOW (ref 13.2–17.1)
BKR WAM IMMATURE GRANULOCYTES: 0.3 % (ref 0.0–1.0)
BKR WAM LYMPHOCYTES: 16.8 % — ABNORMAL LOW (ref 17.0–50.0)
BKR WAM MCH (PG): 24.9 pg — ABNORMAL LOW (ref 27.0–33.0)
BKR WAM MCHC: 31.3 g/dL (ref 31.0–36.0)
BKR WAM MCV: 79.5 fL — ABNORMAL LOW (ref 80.0–100.0)
BKR WAM MONOCYTE ABSOLUTE COUNT.: 0.79 x 1000/ÂµL (ref 0.00–1.00)
BKR WAM MONOCYTES: 11.5 % (ref 4.0–12.0)
BKR WAM MPV: 9 fL (ref 8.0–12.0)
BKR WAM NEUTROPHILS: 68.1 % (ref 39.0–72.0)
BKR WAM NUCLEATED RED BLOOD CELLS: 0 % (ref 0.0–1.0)
BKR WAM PLATELETS: 316 x1000/ÂµL (ref 150–420)
BKR WAM RDW-CV: 15.4 % — ABNORMAL HIGH (ref 11.0–15.0)
BKR WAM RED BLOOD CELL COUNT.: 4.54 M/ÂµL (ref 4.00–6.00)
BKR WAM WHITE BLOOD CELL COUNT: 6.9 x1000/ÂµL (ref 4.0–11.0)

## 2021-02-24 LAB — BASIC METABOLIC PANEL
BKR ANION GAP: 11 (ref 7–17)
BKR BLOOD UREA NITROGEN: 19 mg/dL (ref 6–20)
BKR BUN / CREAT RATIO: 23.8 — ABNORMAL HIGH (ref 8.0–23.0)
BKR CALCIUM: 9.1 mg/dL (ref 8.8–10.2)
BKR CHLORIDE: 103 mmol/L (ref 98–107)
BKR CO2: 23 mmol/L (ref 20–30)
BKR CREATININE: 0.8 mg/dL (ref 0.40–1.30)
BKR EGFR, CREATININE (CKD-EPI 2021): >60 mL/min/{1.73_m2} (ref >=60)
BKR GLUCOSE: 123 mg/dL — ABNORMAL HIGH (ref 70–100)
BKR POTASSIUM: 4.5 mmol/L (ref 3.3–5.3)
BKR SODIUM: 137 mmol/L (ref 136–144)

## 2021-02-24 LAB — LIPASE: BKR LIPASE: 56 U/L — ABNORMAL HIGH (ref 11–55)

## 2021-02-24 MED ORDER — PREDNISONE 20 MG TABLET
20 mg | ORAL_TABLET | Freq: Every day | ORAL | 1 refills | Status: AC
Start: 2021-02-24 — End: ?

## 2021-02-24 MED ORDER — IPRATROPIUM 0.5 MG-ALBUTEROL 3 MG (2.5 MG BASE)/3 ML NEBULIZATION SOLN
0.5 mg-3 mg(2.5 mg base)/3 mL | Freq: Once | RESPIRATORY_TRACT | Status: CP
Start: 2021-02-24 — End: ?
  Administered 2021-02-24: 05:00:00 0.5 mL via RESPIRATORY_TRACT

## 2021-02-24 NOTE — Discharge Instructions
Stay well hydratedTake Prednisone as directedTake Albuterol as directedReturn to the ER if your symptoms worsen, persist or you feel ill for any other reason(s).

## 2021-02-24 NOTE — ED Notes
1:35 AM Clear for DC.  PIV removed.  Verbalized understanding of DC instructions.

## 2021-02-24 NOTE — ED Notes
11:00 PM Pt arrives via walk in triage with c/o abdominal pain that started 1 hour pta. Denies all other complaints at this time. Chief Complaint Patient presents with ? Abdominal Pain   PT c/o RLQ pain that radiates around right flank into his back X 1 hrs.  PT stated he took tylenol with no relief.  Denies vomiting or diarrhea.  Last BM today.
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
## 2021-03-04 ENCOUNTER — Encounter (HOSPITAL_BASED_OUTPATIENT_CLINIC_OR_DEPARTMENT_OTHER)

## 2021-03-04 ENCOUNTER — Inpatient Hospital Stay
Admit: 2021-03-04 | Discharge: 2021-03-05 | Disposition: A | Discharge status: Home / Self Care | Payer: MEDICAID | Attending: Ophthalmology

## 2021-03-04 ENCOUNTER — Other Ambulatory Visit

## 2021-03-04 ENCOUNTER — Emergency Department: Admit: 2021-03-05 | Payer: MEDICAID

## 2021-03-04 DIAGNOSIS — R0789 Other chest pain: Secondary | ICD-10-CM

## 2021-03-04 LAB — COMPREHENSIVE METABOLIC PANEL
ALT: 24 U/L (ref 0–55)
AST: 20 U/L (ref 6–42)
Albumin: 4.1 g/dL (ref 3.2–5.0)
Alkaline phosphatase: 116 U/L (ref 30–130)
Anion Gap: 6 mmol/L (ref 3–14)
BUN: 12 mg/dL (ref 6–24)
Bilirubin, total: 0.2 mg/dL (ref 0.2–1.2)
CO2 (Bicarbonate): 25 mmol/L (ref 20–32)
Calcium: 9.1 mg/dL (ref 8.5–10.5)
Chloride: 105 mmol/L (ref 98–110)
Creatinine: 0.67 mg/dL (ref 0.55–1.30)
Glucose: 99 mg/dL (ref 70–139)
Potassium: 4.1 mmol/L (ref 3.6–5.2)
Protein, total: 6.4 g/dL (ref 6.0–8.4)
Sodium: 136 mmol/L (ref 135–146)
eGFRcr: 114 mL/min/{1.73_m2} (ref >=60)

## 2021-03-04 LAB — CBC WITH DIFFERENTIAL
Hematocrit: 39.1 % (ref 37.0–53.0)
Hemoglobin: 12.4 g/dL — ABNORMAL LOW (ref 13.0–17.5)
MCH: 25.3 pg — ABNORMAL LOW (ref 26.0–34.0)
MCHC: 31.7 g/dL (ref 31.0–37.0)
MCV: 79.6 fL — ABNORMAL LOW (ref 80.0–100.0)
MPV: 9.7 fL (ref 9.1–12.4)
NRBC %: 0 % (ref 0.0–0.0)
NRBC Absolute: 0 10*3/uL (ref 0.00–2.00)
Platelets: 240 10*3/uL (ref 150–400)
RBC: 4.91 M/uL (ref 4.20–5.90)
RDW-CV: 15.8 % — ABNORMAL HIGH (ref 11.5–14.5)
RDW-SD: 45.2 fL (ref 35.0–51.0)
WBC: 5 10*3/uL (ref 4.0–11.0)

## 2021-03-04 LAB — TROPONIN I
Troponin I: <0.01 ng/mL (ref <0.03)
Troponin I: 0.01 ng/mL (ref <0.03)

## 2021-03-04 LAB — MANUAL DIFFERENTIAL - SYSMEX WAM
Basophils %: 2 %
Basophils Absolute: 0.1 10*3/uL (ref 0.00–0.22)
Eosinophils %: 3 %
Eosinophils Absolute: 0.15 10*3/uL (ref 0.00–0.50)
Lymphocytes %: 27 %
Lymphocytes Absolute: 1.35 10*3/uL (ref 0.70–4.00)
Monocytes %: 15 %
Monocytes Absolute: 0.75 10*3/uL (ref 0.38–0.83)
Neutrophils %: 51 %
Neutrophils Absolute: 2.54 10*3/uL (ref 1.50–7.95)
Reactive Lymphocytes %: 2 %
Reactive Lymphs Absolute: 0.1 10*3/uL — ABNORMAL HIGH (ref 0.00–0.00)
Total WBC Counted: 114

## 2021-03-04 MED ORDER — albuterol 90 mcg/actuation inhaler 2 puff
90 | Freq: Once | RESPIRATORY_TRACT | Status: AC
Start: 2021-03-04 — End: 2021-03-04
  Administered 2021-03-05: 03:00:00 2 via RESPIRATORY_TRACT

## 2021-03-04 MED ORDER — ipratropium-albuteroL (Duo-Neb) 0.5-2.5 mg/3 mL nebulizer solution 3 mL
0.5-2.5 | Freq: Once | RESPIRATORY_TRACT | Status: AC
Start: 2021-03-04 — End: 2021-03-04
  Administered 2021-03-05: 01:00:00 3 mL via RESPIRATORY_TRACT

## 2021-03-04 MED ORDER — acetaminophen (Tylenol) tablet 650 mg
325 | Freq: Once | ORAL | Status: DC
Start: 2021-03-04 — End: 2021-03-04

## 2021-03-04 MED ORDER — nitroglycerin (Nitrostat) SL tablet 0.4 mg
0.4 | Freq: Once | SUBLINGUAL | Status: AC
Start: 2021-03-04 — End: 2021-03-04
  Administered 2021-03-05: 01:00:00 0.4 mg via SUBLINGUAL

## 2021-03-04 NOTE — Other (Signed)
 Patient Education   Table of Contents       Nonspecific Chest Pain, Adult     To view videos and all your education online visit,   https://pe.elsevier.com/[email or web address]   or scan this QR code with your smartphone.                    Nonspecific Chest Pain, Adult     Chest pain is an uncomfortable, tight, or painful feeling in the chest. The pain can feel like a crushing, aching, or squeezing pressure. A person can feel a burning or tingling sensation. Chest pain can also be felt in your back, neck, jaw, shoulder, or arm. This pain can be worse when you move, sneeze, or take a deep breath.   Chest pain can be caused by a condition that is life-threatening. This must be treated right away. It can also be caused by something that is not life-threatening. If you have chest pain, it can be hard to know the difference, so it is important to get help right away to make sure that you do not have a serious condition.    Some life-threatening causes of chest pain include:       Heart attack.       A tear in the body's main blood vessel (aortic dissection).       Inflammation around your heart (pericarditis).       A problem in the lungs, such as a blood clot (pulmonary embolism) or a collapsed lung (pneumothorax).      Some non life-threatening causes of chest pain include:       Heartburn.       Anxiety or stress.       Damage to the bones, muscles, and cartilage that make up your chest wall.       Pneumonia or bronchitis.       Shingles infection (varicella-zoster virus).     Your chest pain may come and go. It may also be constant. Your health care provider will do tests and other studies to find the cause of your pain. Treatment will depend on the cause of your chest pain.   Follow these instructions at home:   Medicines         Take over-the-counter and prescription medicines only as told by your health care provider.       If you were prescribed an antibiotic medicine, take it as told by your health care provider. Do not   stop taking the antibiotic even if you start to feel better.     Activity         Avoid any activities that cause chest pain.      Do not  lift anything that is heavier than 10 lb (4.5 kg), or the limit that you are told, until your health care provider says that it is safe.       Rest as directed by your health care provider.       Return to your normal activities only as told by your health care provider. Ask your health care provider what activities are safe for you.     Lifestyle              Do not  use any products that contain nicotine or tobacco, such as cigarettes, e-cigarettes, and chewing tobacco. If you need help quitting, ask your health care provider.      Do not  drink alcohol.      Make  healthy lifestyle changes as recommended. These may include:       Getting regular exercise. Ask your health care provider to suggest some exercises that are safe for you.       Eating a heart-healthy diet. This includes plenty of fresh fruits and vegetables, whole grains, low-fat (lean) protein, and low-fat dairy products. A dietitian can help you find healthy eating options.       Maintaining a healthy weight.       Managing any other health conditions you may have, such as high blood pressure (hypertension) or diabetes.       Reducing stress, such as with yoga or relaxation techniques.       General instructions         Pay attention to any changes in your symptoms.       It is up to you to get the results of any tests that were done. Ask your health care provider, or the department that is doing the tests, when your results will be ready.       Keep all follow-up visits as told by your health care provider. This is important.       You may be asked to go for further testing if your chest pain does not go away.       Contact a health care provider if:         Your chest pain does not go away.       You feel depressed.       You have a fever.       You notice changes in your symptoms or develop new symptoms.     Get  help right away if:         Your chest pain gets worse.       You have a cough that gets worse, or you cough up blood.       You have severe pain in your abdomen.       You faint.       You have sudden, unexplained chest discomfort.       You have sudden, unexplained discomfort in your arms, back, neck, or jaw.       You have shortness of breath at any time.       You suddenly start to sweat, or your skin gets clammy.       You feel nausea or you vomit.       You suddenly feel lightheaded or dizzy.       You have severe weakness, or unexplained weakness or fatigue.       Your heart begins to beat quickly, or it feels like it is skipping beats.     These symptoms may represent a serious problem that is an emergency. Do not wait to see if the symptoms will go away. Get medical help right away. Call your local emergency services (911 in the [place] Do not drive yourself to the hospital.   Summary         Chest pain can be caused by a condition that is serious and requires urgent treatment. It may also be caused by something that is not life-threatening.       Your health care provider may do lab tests and other studies to find the cause of your pain.       Follow your health care provider's instructions on taking medicines, making lifestyle changes, and getting emergency treatment if symptoms become worse.  Keep all follow-up visits as told by your health care provider. This includes visits for any further testing if your chest pain does not go away.     This information is not intended to replace advice given to you by your health care provider. Make sure you discuss any questions you have with your health care provider.     Document Released: 09/27/2006Document Revised: 03/03/2022Document Reviewed: 05/17/2020     Elsevier Patient Education ? 2022 Elsevier Inc[employer]

## 2021-03-04 NOTE — ED Triage Notes (Signed)
 Patient reports left sided chest pain starting 45 PTA, radiating up neck to. Patient denies SOB, endorses dizziness, nausea. Patient with history of MI and stroke. Patient reports pain feels similar to past MIs

## 2021-03-04 NOTE — Discharge Instructions (Addendum)
 You were seen and evaluated in the emergency room for chest pain.  Labs, EKG and chest X-Ray were obtained and were normal and reassuring.  You were given a nebulizer treatment for wheezing with improvement.    Follow-up with primary care within a few days of discharge. If you develop severe, intractable chest pain, increased work of breathing or other worrisome symptoms, please return to the emergency department.

## 2021-03-04 NOTE — ED Provider Notes (Signed)
 Triage PA note: PT is a 50 yo M PMH CAD, HTN, ?CVA who presents with 45 minutes of squeezing chest pain. PT denies SOB, nausea, vomiting. PT reports history of MI. PE: adult male sitting in wheelchair in no acute distress, normal cardix exam, lungs with diffuse wheeze, abdomen soft.     Barnett Hatter Deveney Bayon, [place]  03/04/21 1820

## 2021-03-04 NOTE — ED Notes (Signed)
 Pt reports left sided chest pain that radiates to the left neck. He states he took 324 of baby aspirin PTA. Hx of MI's and stroke. States the pain is different than those times. No vision changes, HA, N/V.      Delight Stare, [place]  03/04/21 [phone]

## 2021-03-04 NOTE — ED Provider Notes (Signed)
 HPI   Chief Complaint   Patient presents with   . Chest Pain       (image)    EMERGENCY DEPARTMENT ENCOUNTER    ATTENDING NOTE      I have personally seen and examined this patient. I have fully participated in the care of this patient. I have reviewed and agree with all pertinent clinical information including history, physical exam, labs, radiographic studies, and the plan. I have also reviewed and agree with the medications, allergies, and past medical history sections for this patient.     Nursing notes reviewed and agreed with unless modified below.    HPI      Patient presents with complaints of squeezing substernal chest pain since about 6 PM starting while he was sitting and doing some paperwork.  No radiation of pain, diaphoresis, palpitations, dyspnea.  He reports a history of myocardial infarction.  Nevertheless he continues to smoke cigarettes.  He also indicates that he  did not have a stent.  He reports that he is prescribed nitroglycerin but had lost it so he did not take anything prior to coming in.  It sounds like this episode was pretty similar to prior episodes of chest pain and he indicates he does get it somewhat frequently.  No URI type symptoms.  No recent trauma or exertion.    Reviewing the EDIE online database, he has had 70 visits to other emergency departments in the past 12 months, [place]  This is his sixth or seventh ED visit so far this month.    PAST MEDICAL HISTORY / PROBLEM LIST    Past Medical History:  No date: Hypertension  No date: MI (mitral incompetence)  No date: Stroke (CMS/HCC)    There is no problem list on file for this patient.      PAST SURGICAL HISTORY    History reviewed. No pertinent surgical history.    MEDICATIONS     Previous Medications  ASPIRIN 81 MG CHEWABLE TABLET     Chew 81 mg.  ATORVASTATIN (LIPITOR) 20 MG TABLET     Take 20 mg by mouth.  LISINOPRIL 20 MG TABLET     Take 20 mg by  mouth.  NITROGLYCERIN (NITROSTAT) 0.4 MG SL TABLET     Place 0.4 mg under the tongue every 5 (five) minutes if needed.     ALLERGIES     -- Ketorolac -- Hives, Rash, Shortness of breath                            and Unknown    --  Other reaction(s): GI Discomfort             Vomit             Vomit   -- Tramadol -- Hives, Itching, Rash and                            Anaphylaxis   -- Reglan (Metoclopramide Hcl)     SOCIAL HISTORY    Social History    Tobacco Use      Smoking status: Every Day        Types: Cigarettes      Smokeless tobacco: Never    Alcohol use: Not on file    Social History    Substance and Sexual Activity  Drug use: Not on file      FAMILY HISTORY    No family history on file.        ROS      Other than noted in HPI, see PA/Resident/student note for more complete ROS  No fever or chills  No headache  No visual complaints  No oral complaints  No neck or back pain  positive chest pain  No dyspnea  No abdominal pain or GI symptoms  No urologic symptoms  No acute skin changes  No pain or swelling of the lower extremities  No known bleeding or clotting disorder  No medication allergies  No acute numbness or weakness  No acute behavioral changes      PHYSICAL EXAM    ED Triage Vitals (03/04/21 1816)  Temp: 37.2 C (99 F)  Pulse: 99  Resp: 18  BP: 100/53  SpO2: 96 %  Temp Source: Temporal  Heart Rate Source: Monitor  Patient Position: Sitting  BP Location: Left arm  FiO2 (%): n/a     General: Well-appearing. NAD.  Appears quite comfortable.  Vital signs normal   head: Atraumatic  Eyes: PERRL, clear conjunctivae.  ENT: Moist oral mucosa.    Neck: Supple, FROM, no JVD  Respiratory: No respiratory distress, bilateral inspiratory and expiratory wheezing, no rales  CVS: RRR, normal S1 and S2, no murmurs  No chest wall tenderness, crepitus, deformity  Abdomen: Soft, non-tender, non-distended.  Back: No MLT, no CVAT.  Extremities: LE symmetric, no edema.  No cords or tenderness  Skin: Normal temperature,  brisk capillary refill, no rash.  No diaphoresis  Neuro: Alert and oriented x3, no focal deficits  Psych: Pleasant, cooperative, nl insight    EKG    EKG interpreted by me and shows:    Normal sinus rhythm without acute ischemic changes    Cardiac Monitor    Rhythm strip interpreted by me and shows:    Normal sinus rhythm without ectopy    LABS    Labs Reviewed  CBC WITH DIFFERENTIAL - WAM AND NON-WAM - Abnormal     WBC                           5.0                    RBC                           4.91                   Hemoglobin                    12.4 (*)               Hematocrit                    39.1                   MCV                           79.6 (*)               MCH                           25.3 (*)  MCHC                          31.7                   RDW-CV                        15.8 (*)               RDW-SD                        45.2                   Platelets                     240                    MPV                           9.7                    NRBC %                        0.0                    NRBC Absolute                 0.00                MANUAL DIFFERENTIAL - SYSMEX WAM - Abnormal     Neutrophils %                 51                     Lymphocytes %                 27                     Monocytes %                   15                     Eosinophils %                 3                      Basophils %                   2                      Reactive Lymphocytes %        2                      Neutrophils Absolute          2.54                   Lymphocytes Absolute          1.35                   Monocytes  Absolute            0.75                   Eosinophils Absolute          0.15                   Basophils Absolute            0.10                   Reactive Lymphs Absolute      0.10 (*)               Total WBC Counted             114                    RBC Morphology                See Indices (*)               Ovalocytes                    3+ (*)               TROPONIN I - Normal     Troponin I                    <0.01               CBC W/DIFF       Narrative: The following orders were created for panel order CBC W/DIFF.                Procedure                               Abnormality         Status                                   ---------                               -----------         ------                                   CBC w/ Differential([id number])           Abnormal            Final result                             Manual Differential (LAB .Marland Kitchen.([id number])  Abnormal            Final result                                             Please view results for these tests on the individual orders.  COMPREHENSIVE METABOLIC PANEL     Sodium  136                    Potassium                     4.1                    Chloride                      105                    CO2 (Bicarbonate)             25                     Anion Gap                     6                      BUN                           12                     Creatinine                    0.67                   eGFRcr                        114                    Glucose                       99                     Fasting?                      No                     Calcium                       9.1                    AST                           20                     ALT                           24                     Alkaline phosphatase          116                    Protein, total                6.4  Albumin                       4.1                    Bilirubin, total              0.2                 URINE DRUG SCREEN  TROPONIN I     RADIOLOGY    X-ray(s) contemporaneously visualized and interpreted by me:    Chest x-ray is clear    XR CHEST 2 VIEWS    (Results Pending)    MEDICAL DECISION MAKING & PLAN:  Patient with a history of an MI, by his report, presents with squeezing substernal chest pain, no other associated symptoms.  Lungs are notable  for wheezing and exam is otherwise unremarkable including the patient appearing quite comfortable.  EKG shows no obvious ischemic changes.  First troponin is negative.  Chest x-ray is clear.  No change after receiving nitroglycerin.  He admits that he gets chest pain fairly frequently, and the EMR reveals frequent emergency department visits across the country.  Given his medical history we will check a second troponin.  In the meantime, he has been very hungry, enjoying 3 sandwiches in quick succession, and continues to appear comfortable.  I had a long discussion with the patient encouraging him to quit smoking cigarettes, discussing matter for at least 3 minutes      Pertinent labs and imaging studies reviewed (see chart for details)                 CLINICAL IMPRESSION:  Chest pain, bronchospasm, tobacco use disorder                          Glasgow Coma Scale Score: 15                                  Patient History   Past Medical History:   Diagnosis Date   . Hypertension    . MI (mitral incompetence)    . Stroke (CMS/HCC)      History reviewed. No pertinent surgical history.  No family history on file.  Social History     Tobacco Use   . Smoking status: Every Day     Types: Cigarettes   . Smokeless tobacco: Never   Substance Use Topics   . Alcohol use: Not on file   . Drug use: Not on file       Review of Systems   Review of Systems    Physical Exam   ED Triage Vitals [03/04/21 1816]   Temp Pulse Resp BP   37.2 C (99 F) 99 18 100/53      SpO2 Temp Source Heart Rate Source Patient Position   96 % Temporal Monitor Sitting      BP Location FiO2 (%)     Left arm --       Physical Exam  No orders to display       Labs Reviewed   CBC WITH DIFFERENTIAL - WAM AND NON-WAM - Abnormal       Result Value    WBC 5.0      RBC 4.91      Hemoglobin 12.4 (*)     Hematocrit 39.1  MCV 79.6 (*)     MCH 25.3 (*)     MCHC 31.7      RDW-CV 15.8 (*)     RDW-SD 45.2      Platelets 240      MPV 9.7      NRBC % 0.0      NRBC  Absolute 0.00     MANUAL DIFFERENTIAL - SYSMEX WAM - Abnormal    Neutrophils % 51      Lymphocytes % 27      Monocytes % 15      Eosinophils % 3      Basophils % 2      Reactive Lymphocytes % 2      Neutrophils Absolute 2.54      Lymphocytes Absolute 1.35      Monocytes Absolute 0.75      Eosinophils Absolute 0.15      Basophils Absolute 0.10      Reactive Lymphs Absolute 0.10 (*)     Total WBC Counted 114      RBC Morphology See Indices (*)     Ovalocytes 3+ (*)    TROPONIN I - Normal    Troponin I <0.01     CBC W/DIFF    Narrative:     The following orders were created for panel order CBC W/DIFF.  Procedure                               Abnormality         Status                     ---------                               -----------         ------                     CBC w/ Differential[[id number]]           Abnormal            Final result               Manual Differential (LAB .Marland KitchenMarland Kitchen[[id number]]  Abnormal            Final result                 Please view results for these tests on the individual orders.   COMPREHENSIVE METABOLIC PANEL    Sodium 136      Potassium 4.1      Chloride 105      CO2 (Bicarbonate) 25      Anion Gap 6      BUN 12      Creatinine 0.67      eGFRcr 114      Glucose 99      Fasting? No      Calcium 9.1      AST 20      ALT 24      Alkaline phosphatase 116      Protein, total 6.4      Albumin 4.1      Bilirubin, total 0.2     URINE DRUG SCREEN   TROPONIN I       ED Course & MDM        MDM  Sharrell Ku. Zachery Conch, MD  03/04/21 2050

## 2021-03-04 NOTE — ED Notes (Signed)
 Pt a/ox3. Current everyday smoker. Pt endorses 45 mins PTA he developed L chest wall pain while seated doing paperwork. Denies radiating but also reports +dizziness and associated nausea. Did not vomit. Pt with hx of MI, states this feels somewhat different than the pain he experienced with his MI. Denies SOB but noted to have expiratory/inspiratory wheezing throughout. Pt reports he lost his nitroglycerin and did not take it.      Bing Matter, RN  03/04/21 [phone]

## 2021-03-04 NOTE — ED Provider Notes (Signed)
 Patient: Austin Klein   MRN: [id number]   Date of service: 03/04/2021   PCP: Per Patient No Pcp     EMERGENCY DEPARTMENT ENCOUNTER    CHIEF COMPLAINT       Chief Complaint   Patient presents with   . Chest Pain       HPI     Austin Klein is a 50 y.o. male with a PMH of hypertension, MI, CAD and stroke who presents to the ED for evaluation of left-sided chest pressure with radiation into his neck x 1.5 hours. The pain began while he was sitting down, drinking tea. The patient states he took 324 chewable aspirin about 30 min PTA, but his pain continues. He states this episode feels differently than the MI reportedly had in 2019 due to him having dizziness when he turns his neck. He endorses smoking 1/2 pack cigarettes daily. He states he is prescribed nitroglycerin, but lost the pills so was unable to take it today. He denies shortness of breath, nausea/vomiting, lightheadedness, diaphoresis, abdominal pain, recent fever or illness.    REVIEW OF SYSTEMS       Constitutional: Negative for fevers/chills, poor po intake and fatigue.  Eyes: Negative for vision changes.  ENT: Negative for sinus congestion or sore throat.  Cardiovascular: Positive for chest pain.  Respiratory: Negative for cough or SOB.   Gastrointestinal: Negative for abdominal pain, nausea, vomiting, diarrhea or constipation.   GU: Negative for hematuria or dysuria.   Skin: Negative for rash.  MSK: Negative for fall, injury, back pain, neck pain or joint pain.   Neuro: Positive for dizziness on head movement. Negative for headache, lightheadedness, numbness, or weakness.   Psych: Positive for marijuana use.     PAST MEDICAL HISTORY         Past Medical History:   Diagnosis Date   . Hypertension    . MI (mitral incompetence)    . Stroke (CMS/HCC)        CURRENT MEDICATIONS       Previous Medications    ASPIRIN 81 MG CHEWABLE TABLET    Chew 81 mg.    ATORVASTATIN (LIPITOR) 20 MG TABLET    Take 20 mg by mouth.    LISINOPRIL 20 MG TABLET    Take 20 mg by  mouth.    NITROGLYCERIN (NITROSTAT) 0.4 MG SL TABLET    Place 0.4 mg under the tongue every 5 (five) minutes if needed.         FAMILY HISTORY       No family history on file.    SURGICAL HISTORY       History reviewed. No pertinent surgical history.    ALLERGIES         Allergies   Allergen Reactions   . Ketorolac Hives, Rash, Shortness of breath and Unknown     Other reaction(s): GI Discomfort  Vomit  Vomit     . Tramadol Hives, Itching, Rash and Anaphylaxis   . Reglan [Metoclopramide Hcl]        SOCIAL HISTORY        Social History     Socioeconomic History   . Marital status: Single     Spouse name: Not on file   . Number of children: Not on file   . Years of education: Not on file   . Highest education level: Not on file   Occupational History   . Not on file   Tobacco Use   .  Smoking status: Every Day     Types: Cigarettes   . Smokeless tobacco: Never   Substance and Sexual Activity   . Alcohol use: Not on file   . Drug use: Not on file   . Sexual activity: Not on file   Other Topics Concern   . Not on file   Social History Narrative   . Not on file     Social Determinants of Health     Financial Resource Strain: Not on file   Food Insecurity: Not on file   Transportation Needs: Not on file   Physical Activity: Not on file   Stress: Not on file   Social Connections: Not on file   Intimate Partner Violence: Not on file   Housing Stability: Not on file       PHYSICAL EXAM        VITAL SIGNS:    ED Triage Vitals [03/04/21 1816]   Temp Pulse Resp BP   37.2 C (99 F) 99 18 100/53      SpO2 Temp Source Heart Rate Source Patient Position   96 % Temporal Monitor Sitting      BP Location FiO2 (%)     Left arm --          General: The patient appears awake, alert, non-toxic, and in no acute distress.  Head: Normocephalic, atraumatic.   Eyes: PERRL. EOMI.  ENT: Moist mucus membranes. No pharyngeal edema or tonsillar exudates.  Neck: Supple, trachea midline. No lymphadenopathy.   Respiratory: Wheezing throughout, no  respiratory distress. No chest wall tenderness.  CVS: Regular rate and rhythm, 2+ peripheral pulses. No peripheral edema. No JVD.  GI: Soft, non-tender, non-distended.  Neuro: UJW11, AOx4, mentation is appropriate for age, moving all 4 extremities. No focal deficits or lateralizing signs.  Psych: Behavior is cooperative. Affect is calm.   Skin: Warm, dry and intact. Normal capillary refill.     RESULTS        LABS  Labs Reviewed   CBC WITH DIFFERENTIAL - WAM AND NON-WAM - Abnormal       Result Value    WBC 5.0      RBC 4.91      Hemoglobin 12.4 (*)     Hematocrit 39.1      MCV 79.6 (*)     MCH 25.3 (*)     MCHC 31.7      RDW-CV 15.8 (*)     RDW-SD 45.2      Platelets 240      MPV 9.7      NRBC % 0.0      NRBC Absolute 0.00     MANUAL DIFFERENTIAL - SYSMEX WAM - Abnormal    Neutrophils % 51      Lymphocytes % 27      Monocytes % 15      Eosinophils % 3      Basophils % 2      Reactive Lymphocytes % 2      Neutrophils Absolute 2.54      Lymphocytes Absolute 1.35      Monocytes Absolute 0.75      Eosinophils Absolute 0.15      Basophils Absolute 0.10      Reactive Lymphs Absolute 0.10 (*)     Total WBC Counted 114      RBC Morphology See Indices (*)     Ovalocytes 3+ (*)    TROPONIN I - Normal    Troponin I <0.01  TROPONIN I - Normal    Troponin I 0.01     CBC W/DIFF    Narrative:     The following orders were created for panel order CBC W/DIFF.  Procedure                               Abnormality         Status                     ---------                               -----------         ------                     CBC w/ Differential[[id number]]           Abnormal            Final result               Manual Differential (LAB .Marland KitchenMarland Kitchen[[id number]]  Abnormal            Final result                 Please view results for these tests on the individual orders.   COMPREHENSIVE METABOLIC PANEL    Sodium 136      Potassium 4.1      Chloride 105      CO2 (Bicarbonate) 25      Anion Gap 6      BUN 12      Creatinine 0.67       eGFRcr 114      Glucose 99      Fasting? No      Calcium 9.1      AST 20      ALT 24      Alkaline phosphatase 116      Protein, total 6.4      Albumin 4.1      Bilirubin, total 0.2     URINE DRUG SCREEN       RADIOLOGY  XR CHEST 2 VIEWS   Preliminary Result      No acute pulmonary abnormality. Hyperinflated lungs can be seen in COPD.               DICTATED: Gerri Lins, MD   IN [facility] WITH DEPARTMENT POLICY, TEACHING PHYSICIANS REVIEW ALL IMAGES, AND EDIT REPORTS AS REQUIRED. A REPORT IS NOT FINAL UNTIL APPROVED BY A STAFF RADIOLOGIST.         ED COURSE & MEDICAL DECISION MAKING        Pertinent Labs & Imaging studies reviewed. (See chart for details)  Diagnoses as of 03/04/21 2132   Atypical chest pain   Wheezing       MDM  The patient is a 50 y.o. male with PMH of hypertension, MI, CAD and stroke who presents to the ED for evaluation of left-sided chest pressure with radiation into his neck x 1.5 hours with onset while sitting down. Took 324 mg aspirin PTA and was unable to take his prescribed nitroglycerin due to losing the pills. On exam, wheezing throughout lung fields. Otherwise, exam is unremarkable.    Differential Diagnosis: ACS, MI, pneumonia, chest wall pain    VSS, EKG without signs of ischemia, CXR suggestive of COPD  without acute findings. Chart review significant for 70 visits to various emergency departments in the past 12 months. Given 0.4 mg SL nitroglycerin with resolution of pain. Serial troponin negative, remainder of labs grossly WNL. Wheezing resolved after duoneb. He continues to rest very comfortably and ate 3 sandwiches while in the ED with no issue. He weas provided with albuterol inhaler and PCP referral for discharge. Return instructions were discussed and the patient is in agreement with plan.    CLINICAL IMPRESSION  1. Atypical chest pain    2. Wheezing     3. Tobacco use disorder    Final Disposition:    Discharge  _______________________________________________________________________________________________    Arther Dames, PA          Arther Dames, PA  03/04/21 2137

## 2021-03-04 NOTE — ED Provider Notes (Signed)
 Formatting of this note is different from the original.  HPI   Chief Complaint   Patient presents with   ? Chest Pain     (image)    EMERGENCY DEPARTMENT ENCOUNTER    ATTENDING NOTE    I have personally seen and examined this patient. I have fully participated in the care of this patient. I have reviewed and agree with all pertinent clinical information including history, physical exam, labs, radiographic studies, and the plan. I have also reviewed and agree with the medications, allergies, and past medical history sections for this patient.     Nursing notes reviewed and agreed with unless modified below.    HPI      Patient presents with complaints of squeezing substernal chest pain since about 6 PM starting while he was sitting and doing some paperwork.  No radiation of pain, diaphoresis, palpitations, dyspnea.  He reports a history of myocardial infarction.  Nevertheless he continues to smoke cigarettes.  He also indicates that he  did not have a stent.  He reports that he is prescribed nitroglycerin but had lost it so he did not take anything prior to coming in.  It sounds like this episode was pretty similar to prior episodes of chest pain and he indicates he does get it somewhat frequently.  No URI type symptoms.  No recent trauma or exertion.    Reviewing the EDIE online database, he has had 70 visits to other emergency departments in the past 12 months, [place]  This is his sixth or seventh ED visit so far this month.    PAST MEDICAL HISTORY / PROBLEM LIST    Past Medical History:  No date: Hypertension  No date: MI (mitral incompetence)  No date: Stroke (CMS/HCC)    There is no problem list on file for this patient.    PAST SURGICAL HISTORY    History reviewed. No pertinent surgical history.    MEDICATIONS     Previous Medications  ASPIRIN 81 MG CHEWABLE TABLET     Chew 81 mg.  ATORVASTATIN (LIPITOR) 20 MG TABLET     Take 20 mg by  mouth.  LISINOPRIL 20 MG TABLET     Take 20 mg by mouth.  NITROGLYCERIN (NITROSTAT) 0.4 MG SL TABLET     Place 0.4 mg under the tongue every 5 (five) minutes if needed.     ALLERGIES     -- Ketorolac -- Hives, Rash, Shortness of breath                            and Unknown    --  Other reaction(s): GI Discomfort             Vomit             Vomit   -- Tramadol -- Hives, Itching, Rash and                            Anaphylaxis   -- Reglan (Metoclopramide Hcl)     SOCIAL HISTORY    Social History    Tobacco Use      Smoking status: Every Day        Types: Cigarettes      Smokeless tobacco: Never    Alcohol use: Not on file    Social History    Substance and Sexual Activity  Drug use: Not on file    FAMILY HISTORY    No family history on file.    ROS      Other than noted in HPI, see PA/Resident/student note for more complete ROS  No fever or chills  No headache  No visual complaints  No oral complaints  No neck or back pain  positive chest pain  No dyspnea  No abdominal pain or GI symptoms  No urologic symptoms  No acute skin changes  No pain or swelling of the lower extremities  No known bleeding or clotting disorder  No medication allergies  No acute numbness or weakness  No acute behavioral changes    PHYSICAL EXAM    ED Triage Vitals (03/04/21 1816)  Temp: 37.2 C (99 F)  Pulse: 99  Resp: 18  BP: 100/53  SpO2: 96 %  Temp Source: Temporal  Heart Rate Source: Monitor  Patient Position: Sitting  BP Location: Left arm  FiO2 (%): n/a     General: Well-appearing. NAD.  Appears quite comfortable.  Vital signs normal   head: Atraumatic  Eyes: PERRL, clear conjunctivae.  ENT: Moist oral mucosa.    Neck: Supple, FROM, no JVD  Respiratory: No respiratory distress, bilateral inspiratory and expiratory wheezing, no rales  CVS: RRR, normal S1 and S2, no murmurs  No chest wall tenderness, crepitus, deformity  Abdomen: Soft, non-tender, non-distended.  Back: No MLT, no CVAT.  Extremities: LE symmetric, no edema.  No cords  or tenderness  Skin: Normal temperature, brisk capillary refill, no rash.  No diaphoresis  Neuro: Alert and oriented x3, no focal deficits  Psych: Pleasant, cooperative, nl insight    EKG    EKG interpreted by me and shows:    Normal sinus rhythm without acute ischemic changes    Cardiac Monitor    Rhythm strip interpreted by me and shows:    Normal sinus rhythm without ectopy    LABS    Labs Reviewed  CBC WITH DIFFERENTIAL - WAM AND NON-WAM - Abnormal     WBC                           5.0                    RBC                           4.91                   Hemoglobin                    12.4 (*)               Hematocrit                    39.1                   MCV                           79.6 (*)               MCH                           25.3 (*)  MCHC                          31.7                   RDW-CV                        15.8 (*)               RDW-SD                        45.2                   Platelets                     240                    MPV                           9.7                    NRBC %                        0.0                    NRBC Absolute                 0.00                MANUAL DIFFERENTIAL - SYSMEX WAM - Abnormal     Neutrophils %                 51                     Lymphocytes %                 27                     Monocytes %                   15                     Eosinophils %                 3                      Basophils %                   2                      Reactive Lymphocytes %        2                      Neutrophils Absolute          2.54                   Lymphocytes Absolute          1.35                   Monocytes  Absolute            0.75                   Eosinophils Absolute          0.15                   Basophils Absolute            0.10                   Reactive Lymphs Absolute      0.10 (*)               Total WBC Counted             114                    RBC Morphology                See Indices (*)                Ovalocytes                    3+ (*)              TROPONIN I - Normal     Troponin I                    <0.01               CBC W/DIFF       Narrative: The following orders were created for panel order CBC W/DIFF.                Procedure                               Abnormality         Status                                   ---------                               -----------         ------                                   CBC w/ Differential([id number])           Abnormal            Final result                             Manual Differential (LAB .Marland Kitchen.([id number])  Abnormal            Final result                                             Please view results for these tests on the individual orders.  COMPREHENSIVE METABOLIC PANEL     Sodium  136                    Potassium                     4.1                    Chloride                      105                    CO2 (Bicarbonate)             25                     Anion Gap                     6                      BUN                           12                     Creatinine                    0.67                   eGFRcr                        114                    Glucose                       99                     Fasting?                      No                     Calcium                       9.1                    AST                           20                     ALT                           24                     Alkaline phosphatase          116                    Protein, total                6.4  Albumin                       4.1                    Bilirubin, total              0.2                 URINE DRUG SCREEN  TROPONIN I     RADIOLOGY    X-ray(s) contemporaneously visualized and interpreted by me:    Chest x-ray is clear    XR CHEST 2 VIEWS    (Results Pending)    MEDICAL DECISION MAKING & PLAN:  Patient with a history of an MI, by his report, presents with squeezing substernal chest pain,  no other associated symptoms.  Lungs are notable for wheezing and exam is otherwise unremarkable including the patient appearing quite comfortable.  EKG shows no obvious ischemic changes.  First troponin is negative.  Chest x-ray is clear.  No change after receiving nitroglycerin.  He admits that he gets chest pain fairly frequently, and the EMR reveals frequent emergency department visits across the country.  Given his medical history we will check a second troponin.  In the meantime, he has been very hungry, enjoying 3 sandwiches in quick succession, and continues to appear comfortable.  I had a long discussion with the patient encouraging him to quit smoking cigarettes, discussing matter for at least 3 minutes    Pertinent labs and imaging studies reviewed (see chart for details)        CLINICAL IMPRESSION:  Chest pain, bronchospasm, tobacco use disorder          Glasgow Coma Scale Score: 15      Patient History   Past Medical History:   Diagnosis Date   ? Hypertension    ? MI (mitral incompetence)    ? Stroke (CMS/HCC)      History reviewed. No pertinent surgical history.  No family history on file.  Social History     Tobacco Use   ? Smoking status: Every Day     Types: Cigarettes   ? Smokeless tobacco: Never   Substance Use Topics   ? Alcohol use: Not on file   ? Drug use: Not on file     Review of Systems   Review of Systems    Physical Exam   ED Triage Vitals [03/04/21 1816]   Temp Pulse Resp BP   37.2 C (99 F) 99 18 100/53     SpO2 Temp Source Heart Rate Source Patient Position   96 % Temporal Monitor Sitting     BP Location FiO2 (%)     Left arm --       Physical Exam  No orders to display     Labs Reviewed   CBC WITH DIFFERENTIAL - WAM AND NON-WAM - Abnormal       Result Value    WBC 5.0      RBC 4.91      Hemoglobin 12.4 (*)     Hematocrit 39.1      MCV 79.6 (*)     MCH 25.3 (*)     MCHC 31.7      RDW-CV 15.8 (*)     RDW-SD 45.2      Platelets 240      MPV 9.7      NRBC % 0.0  NRBC Absolute 0.00      MANUAL DIFFERENTIAL - SYSMEX WAM - Abnormal    Neutrophils % 51      Lymphocytes % 27      Monocytes % 15      Eosinophils % 3      Basophils % 2      Reactive Lymphocytes % 2      Neutrophils Absolute 2.54      Lymphocytes Absolute 1.35      Monocytes Absolute 0.75      Eosinophils Absolute 0.15      Basophils Absolute 0.10      Reactive Lymphs Absolute 0.10 (*)     Total WBC Counted 114      RBC Morphology See Indices (*)     Ovalocytes 3+ (*)    TROPONIN I - Normal    Troponin I <0.01     CBC W/DIFF    Narrative:     The following orders were created for panel order CBC W/DIFF.  Procedure                               Abnormality         Status                     ---------                               -----------         ------                     CBC w/ Differential[[id number]]           Abnormal            Final result               Manual Differential (LAB .Marland KitchenMarland Kitchen[[id number]]  Abnormal            Final result                 Please view results for these tests on the individual orders.   COMPREHENSIVE METABOLIC PANEL    Sodium 136      Potassium 4.1      Chloride 105      CO2 (Bicarbonate) 25      Anion Gap 6      BUN 12      Creatinine 0.67      eGFRcr 114      Glucose 99      Fasting? No      Calcium 9.1      AST 20      ALT 24      Alkaline phosphatase 116      Protein, total 6.4      Albumin 4.1      Bilirubin, total 0.2     URINE DRUG SCREEN   TROPONIN I     ED Course & MDM       MDM      Sharrell Ku. Zachery Conch, MD  03/04/21 2050    Electronically signed by Sharrell Ku. Zachery Conch, MD at 03/04/2021  8:50 PM EST

## 2021-03-04 NOTE — ED Notes (Signed)
 Formatting of this note might be different from the original.  Pt reports left sided chest pain that radiates to the left neck. He states he took 324 of baby aspirin PTA. Hx of MI's and stroke. States the pain is different than those times. No vision changes, HA, N/V.     Delight Stare, RN  03/04/21 1849    Electronically signed by Delight Stare, RN at 03/04/2021  6:49 PM EST

## 2021-03-04 NOTE — Procedures (Signed)
 Formatting of this note is different from the original.  Patient Education   Table of Contents       Nonspecific Chest Pain, Adult     To view videos and all your education online visit,   [email or web address].elsevier.com/[email or web address]   or scan this QR code with your smartphone.         Nonspecific Chest Pain, Adult     Chest pain is an uncomfortable, tight, or painful feeling in the chest. The pain can feel like a crushing, aching, or squeezing pressure. A person can feel a burning or tingling sensation. Chest pain can also be felt in your back, neck, jaw, shoulder, or arm. This pain can be worse when you move, sneeze, or take a deep breath.   Chest pain can be caused by a condition that is life-threatening. This must be treated right away. It can also be caused by something that is not life-threatening. If you have chest pain, it can be hard to know the difference, so it is important to get help right away to make sure that you do not have a serious condition.    Some life-threatening causes of chest pain include:       Heart attack.       A tear in the body's main blood vessel (aortic dissection).       Inflammation around your heart (pericarditis).       A problem in the lungs, such as a blood clot (pulmonary embolism) or a collapsed lung (pneumothorax).      Some non life-threatening causes of chest pain include:       Heartburn.       Anxiety or stress.       Damage to the bones, muscles, and cartilage that make up your chest wall.       Pneumonia or bronchitis.       Shingles infection (varicella-zoster virus).     Your chest pain may come and go. It may also be constant. Your health care provider will do tests and other studies to find the cause of your pain. Treatment will depend on the cause of your chest pain.   Follow these instructions at home:   Medicines       Take over-the-counter and prescription medicines only as told by your health care provider.       If you were prescribed an antibiotic medicine, take it as  told by your health care provider. Do not  stop taking the antibiotic even if you start to feel better.     Activity       Avoid any activities that cause chest pain.      Do not  lift anything that is heavier than 10 lb (4.5 kg), or the limit that you are told, until your health care provider says that it is safe.       Rest as directed by your health care provider.       Return to your normal activities only as told by your health care provider. Ask your health care provider what activities are safe for you.     Lifestyle          Do not  use any products that contain nicotine or tobacco, such as cigarettes, e-cigarettes, and chewing tobacco. If you need help quitting, ask your health care provider.      Do not  drink alcohol.      Make healthy lifestyle changes as recommended. These may include:  Getting regular exercise. Ask your health care provider to suggest some exercises that are safe for you.       Eating a heart-healthy diet. This includes plenty of fresh fruits and vegetables, whole grains, low-fat (lean) protein, and low-fat dairy products. A dietitian can help you find healthy eating options.       Maintaining a healthy weight.       Managing any other health conditions you may have, such as high blood pressure (hypertension) or diabetes.       Reducing stress, such as with yoga or relaxation techniques.     General instructions       Pay attention to any changes in your symptoms.       It is up to you to get the results of any tests that were done. Ask your health care provider, or the department that is doing the tests, when your results will be ready.       Keep all follow-up visits as told by your health care provider. This is important.       You may be asked to go for further testing if your chest pain does not go away.     Contact a health care provider if:       Your chest pain does not go away.       You feel depressed.       You have a fever.       You notice changes in your symptoms or  develop new symptoms.     Get help right away if:       Your chest pain gets worse.       You have a cough that gets worse, or you cough up blood.       You have severe pain in your abdomen.       You faint.       You have sudden, unexplained chest discomfort.       You have sudden, unexplained discomfort in your arms, back, neck, or jaw.       You have shortness of breath at any time.       You suddenly start to sweat, or your skin gets clammy.       You feel nausea or you vomit.       You suddenly feel lightheaded or dizzy.       You have severe weakness, or unexplained weakness or fatigue.       Your heart begins to beat quickly, or it feels like it is skipping beats.     These symptoms may represent a serious problem that is an emergency. Do not wait to see if the symptoms will go away. Get medical help right away. Call your local emergency services (911 in the [place] Do not drive yourself to the hospital.   Summary       Chest pain can be caused by a condition that is serious and requires urgent treatment. It may also be caused by something that is not life-threatening.       Your health care provider may do lab tests and other studies to find the cause of your pain.       Follow your health care provider's instructions on taking medicines, making lifestyle changes, and getting emergency treatment if symptoms become worse.       Keep all follow-up visits as told by your health care provider. This includes visits for any further testing if your chest pain  does not go away.     This information is not intended to replace advice given to you by your health care provider. Make sure you discuss any questions you have with your health care provider.     Document Released: 09/27/2006Document Revised: 03/03/2022Document Reviewed: 05/17/2020     Elsevier Patient Education ? 2022 [employer]       Electronically signed by Arther Dames, PA at 03/04/2021  9:23 PM EST

## 2021-03-04 NOTE — ED Provider Notes (Signed)
 Formatting of this note might be different from the original.  Triage PA note: PT is a 50 yo M PMH CAD, HTN, ?CVA who presents with 45 minutes of squeezing chest pain. PT denies SOB, nausea, vomiting. PT reports history of MI. PE: adult male sitting in wheelchair in no acute distress, normal cardix exam, lungs with diffuse wheeze, abdomen soft.    Barnett Hatter [place], [place]  03/04/21 1820    Electronically signed by Sharrell Ku. Zachery Conch, MD at 03/04/2021  7:08 PM EST    Associated attestation - Vevelyn Pat., MD - 03/04/2021  7:08 PM EST  Formatting of this note might be different from the original.  Patient seen by the advanced practice provider under my supervision. I agree with the assessment and evaluation/work up. I personally saw the patient and performed a substantial portion of the visit including all aspects of the medical decision making.

## 2021-03-04 NOTE — ED Provider Notes (Signed)
 Formatting of this note is different from the original.  Patient: Miguel Carr   MRN: [id number]   Date of service: 03/04/2021   PCP: Per Patient No Pcp     EMERGENCY DEPARTMENT ENCOUNTER    CHIEF COMPLAINT       Chief Complaint   Patient presents with   ? Chest Pain     HPI     Miguel Carr is a 50 y.o. male with a PMH of hypertension, MI, CAD and stroke who presents to the ED for evaluation of left-sided chest pressure with radiation into his neck x 1.5 hours. The pain began while he was sitting down, drinking tea. The patient states he took 324 chewable aspirin about 30 min PTA, but his pain continues. He states this episode feels differently than the MI reportedly had in 2019 due to him having dizziness when he turns his neck. He endorses smoking 1/2 pack cigarettes daily. He states he is prescribed nitroglycerin, but lost the pills so was unable to take it today. He denies shortness of breath, nausea/vomiting, lightheadedness, diaphoresis, abdominal pain, recent fever or illness.    REVIEW OF SYSTEMS       Constitutional: Negative for fevers/chills, poor po intake and fatigue.  Eyes: Negative for vision changes.  ENT: Negative for sinus congestion or sore throat.  Cardiovascular: Positive for chest pain.  Respiratory: Negative for cough or SOB.   Gastrointestinal: Negative for abdominal pain, nausea, vomiting, diarrhea or constipation.   GU: Negative for hematuria or dysuria.   Skin: Negative for rash.  MSK: Negative for fall, injury, back pain, neck pain or joint pain.   Neuro: Positive for dizziness on head movement. Negative for headache, lightheadedness, numbness, or weakness.   Psych: Positive for marijuana use.     PAST MEDICAL HISTORY         Past Medical History:   Diagnosis Date   ? Hypertension    ? MI (mitral incompetence)    ? Stroke (CMS/HCC)      CURRENT MEDICATIONS       Previous Medications    ASPIRIN 81 MG CHEWABLE TABLET    Chew 81 mg.    ATORVASTATIN (LIPITOR) 20 MG TABLET    Take 20 mg by  mouth.    LISINOPRIL 20 MG TABLET    Take 20 mg by mouth.    NITROGLYCERIN (NITROSTAT) 0.4 MG SL TABLET    Place 0.4 mg under the tongue every 5 (five) minutes if needed.       FAMILY HISTORY       No family history on file.    SURGICAL HISTORY       History reviewed. No pertinent surgical history.    ALLERGIES         Allergies   Allergen Reactions   ? Ketorolac Hives, Rash, Shortness of breath and Unknown     Other reaction(s): GI Discomfort  Vomit  Vomit    ? Tramadol Hives, Itching, Rash and Anaphylaxis   ? Reglan [Metoclopramide Hcl]      SOCIAL HISTORY        Social History     Socioeconomic History   ? Marital status: Single     Spouse name: Not on file   ? Number of children: Not on file   ? Years of education: Not on file   ? Highest education level: Not on file   Occupational History   ? Not on file   Tobacco Use   ?  Smoking status: Every Day     Types: Cigarettes   ? Smokeless tobacco: Never   Substance and Sexual Activity   ? Alcohol use: Not on file   ? Drug use: Not on file   ? Sexual activity: Not on file   Other Topics Concern   ? Not on file   Social History Narrative   ? Not on file     Social Determinants of Health     Financial Resource Strain: Not on file   Food Insecurity: Not on file   Transportation Needs: Not on file   Physical Activity: Not on file   Stress: Not on file   Social Connections: Not on file   Intimate Partner Violence: Not on file   Housing Stability: Not on file     PHYSICAL EXAM        VITAL SIGNS:    ED Triage Vitals [03/04/21 1816]   Temp Pulse Resp BP   37.2 C (99 F) 99 18 100/53     SpO2 Temp Source Heart Rate Source Patient Position   96 % Temporal Monitor Sitting     BP Location FiO2 (%)     Left arm --         General: The patient appears awake, alert, non-toxic, and in no acute distress.  Head: Normocephalic, atraumatic.   Eyes: PERRL. EOMI.  ENT: Moist mucus membranes. No pharyngeal edema or tonsillar exudates.  Neck: Supple, trachea midline. No lymphadenopathy.    Respiratory: Wheezing throughout, no respiratory distress. No chest wall tenderness.  CVS: Regular rate and rhythm, 2+ peripheral pulses. No peripheral edema. No JVD.  GI: Soft, non-tender, non-distended.  Neuro: UUV25, AOx4, mentation is appropriate for age, moving all 4 extremities. No focal deficits or lateralizing signs.  Psych: Behavior is cooperative. Affect is calm.   Skin: Warm, dry and intact. Normal capillary refill.     RESULTS        LABS  Labs Reviewed   CBC WITH DIFFERENTIAL - WAM AND NON-WAM - Abnormal       Result Value    WBC 5.0      RBC 4.91      Hemoglobin 12.4 (*)     Hematocrit 39.1      MCV 79.6 (*)     MCH 25.3 (*)     MCHC 31.7      RDW-CV 15.8 (*)     RDW-SD 45.2      Platelets 240      MPV 9.7      NRBC % 0.0      NRBC Absolute 0.00     MANUAL DIFFERENTIAL - S[facility]EX WAM - Abnormal    Neutrophils % 51      Lymphocytes % 27      Monocytes % 15      Eosinophils % 3      Basophils % 2      Reactive Lymphocytes % 2      Neutrophils Absolute 2.54      Lymphocytes Absolute 1.35      Monocytes Absolute 0.75      Eosinophils Absolute 0.15      Basophils Absolute 0.10      Reactive Lymphs Absolute 0.10 (*)     Total WBC Counted 114      RBC Morphology See Indices (*)     Ovalocytes 3+ (*)    TROPONIN I - Normal    Troponin I <0.01     TROPONIN  I - Normal    Troponin I 0.01     CBC W/DIFF    Narrative:     The following orders were created for panel order CBC W/DIFF.  Procedure                               Abnormality         Status                     ---------                               -----------         ------                     CBC w/ Differential[[id number]]           Abnormal            Final result               Manual Differential (LAB .Marland KitchenMarland Kitchen[[id number]]  Abnormal            Final result                 Please view results for these tests on the individual orders.   COMPREHENSIVE METABOLIC PANEL    Sodium 136      Potassium 4.1      Chloride 105      CO2 (Bicarbonate) 25      Anion Gap 6       BUN 12      Creatinine 0.67      eGFRcr 114      Glucose 99      Fasting? No      Calcium 9.1      AST 20      ALT 24      Alkaline phosphatase 116      Protein, total 6.4      Albumin 4.1      Bilirubin, total 0.2     URINE DRUG SCREEN     RADIOLOGY  XR CHEST 2 VIEWS   Preliminary Result     No acute pulmonary abnormality. Hyperinflated lungs can be seen in COPD.           DICTATED: Gerri Lins, MD   IN [facility] WITH DEPARTMENT POLICY, TEACHING PHYSICIANS REVIEW ALL IMAGES, AND EDIT REPORTS AS REQUIRED. A REPORT IS NOT FINAL UNTIL APPROVED BY A STAFF RADIOLOGIST.       ED COURSE & MEDICAL DECISION MAKING        Pertinent Labs & Imaging studies reviewed. (See chart for details)  Diagnoses as of 03/04/21 2132   Atypical chest pain   Wheezing     MDM  The patient is a 50 y.o. male with PMH of hypertension, MI, CAD and stroke who presents to the ED for evaluation of left-sided chest pressure with radiation into his neck x 1.5 hours with onset while sitting down. Took 324 mg aspirin PTA and was unable to take his prescribed nitroglycerin due to losing the pills. On exam, wheezing throughout lung fields. Otherwise, exam is unremarkable.    Differential Diagnosis: ACS, MI, pneumonia, chest wall pain    VSS, EKG without signs of ischemia, CXR suggestive of COPD without acute findings. Chart review significant for 70 visits to various emergency  departments in the past 12 months. Given 0.4 mg SL nitroglycerin with resolution of pain. Serial troponin negative, remainder of labs grossly WNL. Wheezing resolved after duoneb. He continues to rest very comfortably and ate 3 sandwiches while in the ED with no issue. He weas provided with albuterol inhaler and PCP referral for discharge. Return instructions were discussed and the patient is in agreement with plan.    CLINICAL IMPRESSION  1. Atypical chest pain    2. Wheezing     3. Tobacco use disorder    Final Disposition:    Discharge  _______________________________________________________________________________________________    Arther Dames, PA       Arther Dames, PA  03/04/21 2137    Electronically signed by Sharrell Ku. Zachery Conch, MD at 03/04/2021  9:55 PM EST    Associated attestation - Vevelyn Pat., MD - 03/04/2021  9:55 PM EST  Formatting of this note might be different from the original.  Patient seen by the advanced practice provider under my supervision. I agree with the assessment and evaluation/work up. I personally saw the patient and performed a substantial portion of the visit including all aspects of the medical decision making.

## 2021-03-04 NOTE — ED Notes (Signed)
 Formatting of this note might be different from the original.  Pt a/ox3. Current everyday smoker. Pt endorses 45 mins PTA he developed L chest wall pain while seated doing paperwork. Denies radiating but also reports +dizziness and associated nausea. Did not vomit. Pt with hx of MI, states this feels somewhat different than the pain he experienced with his MI. Denies SOB but noted to have expiratory/inspiratory wheezing throughout. Pt reports he lost his nitroglycerin and did not take it.     Bing Matter, RN  03/04/21 1923    Electronically signed by Bing Matter, RN at 03/04/2021  7:23 PM EST

## 2021-03-04 NOTE — ED Triage Notes (Signed)
 Formatting of this note might be different from the original.  Patient reports left sided chest pain starting 45 PTA, radiating up neck to. Patient denies SOB, endorses dizziness, nausea. Patient with history of MI and stroke. Patient reports pain feels similar to past MIs  Electronically signed by Lutricia Horsfall, RN at 03/04/2021  6:16 PM EST

## 2021-03-05 MED FILL — ACETAMINOPHEN 325 MG TABLET: 325 325 mg | ORAL | Qty: 2

## 2021-03-05 MED FILL — ALBUTEROL SULFATE HFA 90 MCG/ACTUATION AEROSOL INHALER: 90 90 mcg/actuation | RESPIRATORY_TRACT | Qty: 8

## 2021-03-05 MED FILL — NITROGLYCERIN 0.4 MG SUBLINGUAL TABLET: 0.4 0.4 mg | SUBLINGUAL | Qty: 25

## 2021-03-05 MED FILL — IPRATROPIUM-ALBUTEROL 0.5-2.5 (3) MG/3ML IN SOLN: 0.5-2.5 0.5-2.5 mg/3 mL | RESPIRATORY_TRACT | Qty: 3

## 2021-06-02 LAB — BMP (EXT)
Anion Gap (EXT): 8 mmol/L (ref 4–15)
BUN (EXT): 11 mg/dL (ref 7–20)
CO2 (EXT): 20 mmol/L — ABNORMAL LOW (ref 22–30)
CalciumCalcium (EXT): 9.2 mg/dL (ref 8.4–10.2)
Chloride (EXT): 112 mmol/L — ABNORMAL HIGH (ref 98–109)
Creatinine (EXT): 0.49 mg/dL — ABNORMAL LOW (ref 0.66–1.25)
Glucose (EXT): 101 mg/dL — ABNORMAL HIGH (ref 74–100)
Potassium (EXT): 3.7 mmol/L (ref 3.5–5.5)
Sodium (EXT): 140 mmol/L (ref 137–145)
eGFR - Creat MDRD (EXT): >90 mL/min/1.73 "square meters" (ref >=60)

## 2021-06-03 LAB — BMP (EXT)
Anion Gap (EXT): 3 mmol/L — ABNORMAL LOW (ref 4–15)
BUN (EXT): 16 mg/dL (ref 7–20)
CO2 (EXT): 22 mmol/L (ref 22–30)
CalciumCalcium (EXT): 8.7 mg/dL (ref 8.4–10.2)
Chloride (EXT): 109 mmol/L (ref 98–109)
Creatinine (EXT): 0.55 mg/dL — ABNORMAL LOW (ref 0.66–1.25)
Glucose (EXT): 103 mg/dL — ABNORMAL HIGH (ref 74–100)
Potassium (EXT): 4.4 mmol/L (ref 3.5–5.5)
Sodium (EXT): 134 mmol/L — ABNORMAL LOW (ref 137–145)
eGFR - Creat MDRD (EXT): >90 mL/min/1.73 "square meters" (ref >=60)

## 2021-06-04 LAB — BMP (EXT)
Anion Gap (EXT): 7 mmol/L (ref 6–14)
BUN (EXT): 12 mg/dL (ref 9–23)
CO2 (EXT): 18 mmol/L — ABNORMAL LOW (ref 20–31)
CalciumCalcium (EXT): 7.4 mg/dL — ABNORMAL LOW (ref 8.3–10.6)
Chloride (EXT): 109 mmol/L — ABNORMAL HIGH (ref 98–107)
Creatinine (EXT): 0.54 mg/dL — ABNORMAL LOW (ref 0.70–1.30)
Glucose (EXT): 84 mg/dL (ref 74–106)
Potassium (EXT): 4.1 mmol/L (ref 3.5–5.1)
Sodium (EXT): 134 mmol/L (ref 134–145)
eGFR - Creat MDRD (EXT): 122 mL/min/1.73 "square meters" (ref >=60)

## 2021-06-05 LAB — BMP (EXT)
Anion Gap (EXT): 7 mmol/L (ref 6–14)
BUN (EXT): 13 mg/dL (ref 9–23)
CO2 (EXT): 21 mmol/L (ref 20–31)
CalciumCalcium (EXT): 8.8 mg/dL (ref 8.3–10.6)
Chloride (EXT): 111 mmol/L — ABNORMAL HIGH (ref 98–107)
Creatinine (EXT): 0.61 mg/dL — ABNORMAL LOW (ref 0.70–1.30)
Glucose (EXT): 102 mg/dL (ref 74–106)
Potassium (EXT): 4.2 mmol/L (ref 3.5–5.1)
Sodium (EXT): 139 mmol/L (ref 134–145)
eGFR - Creat MDRD (EXT): 118 mL/min/1.73 "square meters" (ref >=60)

## 2021-06-07 LAB — BMP (EXT)
Anion Gap (EXT): 5 mmol/L — ABNORMAL LOW (ref 6–14)
BUN (EXT): 12 mg/dL (ref 9–23)
CO2 (EXT): 22 mmol/L (ref 20–31)
CalciumCalcium (EXT): 9 mg/dL (ref 8.3–10.6)
Chloride (EXT): 112 mmol/L — ABNORMAL HIGH (ref 98–107)
Creatinine (EXT): 0.51 mg/dL — ABNORMAL LOW (ref 0.70–1.30)
Glucose (EXT): 113 mg/dL — ABNORMAL HIGH (ref 74–106)
Potassium (EXT): 3.8 mmol/L (ref 3.5–5.1)
Sodium (EXT): 139 mmol/L (ref 134–145)
eGFR - Creat MDRD (EXT): 124 mL/min/1.73 "square meters" (ref >=60)

## 2021-07-29 LAB — CMP (EXT)
ALT/SGPT (EXT): 30 U/L (ref <50)
AST/SGOT (EXT): 25 U/L (ref 17–59)
Albumin (EXT): 4 g/dL (ref 3.5–5.0)
Alkaline Phosphatase (EXT): 110 U/L (ref 38–126)
Anion Gap (EXT): 14 mmol/L (ref 7–15)
BUN (EXT): 13 mg/dL (ref 9–20)
Bilirubin, Total (EXT): 0.4 mg/dL (ref 0.2–1.3)
CO2 (EXT): 21 mmol/L — ABNORMAL LOW (ref 22–32)
CalciumCalcium (EXT): 8.9 mg/dL (ref 8.4–10.6)
Chloride (EXT): 104 mmol/L (ref 98–107)
Creatinine (EXT): 0.5 mg/dL — ABNORMAL LOW (ref 0.66–1.25)
Glucose (EXT): 97 mg/dL (ref 60–100)
Potassium (EXT): 3.9 mmol/L (ref 3.5–5.5)
Protein (EXT): 6.6 g/dL (ref 6.3–8.2)
Sodium (EXT): 139 mmol/L (ref 135–144)
eGFR - Creat CKD-EPI (EXT): >90 mL/min/{1.73_m2} (ref >=60)

## 2021-07-30 LAB — CMP (EXT)
ALT/SGPT (EXT): 31 U/L (ref <50)
AST/SGOT (EXT): 25 U/L (ref 17–59)
Albumin (EXT): 4.2 g/dL (ref 3.5–5.0)
Alkaline Phosphatase (EXT): 122 U/L (ref 38–126)
Anion Gap (EXT): 5 mmol/L — ABNORMAL LOW (ref 7–15)
BUN (EXT): 15 mg/dL (ref 9–20)
Bilirubin, Total (EXT): 0.4 mg/dL (ref 0.2–1.3)
CO2 (EXT): 22 mmol/L (ref 22–32)
CalciumCalcium (EXT): 9 mg/dL (ref 8.4–10.6)
Chloride (EXT): 114 mmol/L — ABNORMAL HIGH (ref 98–107)
Creatinine (EXT): 0.9 mg/dL (ref 0.66–1.25)
Glucose (EXT): 102 mg/dL — ABNORMAL HIGH (ref 60–100)
Potassium (EXT): 4.1 mmol/L (ref 3.5–5.5)
Protein (EXT): 6.6 g/dL (ref 6.3–8.2)
Sodium (EXT): 141 mmol/L (ref 135–144)
eGFR - Creat CKD-EPI (EXT): >90 mL/min/{1.73_m2} (ref >=60)

## 2021-07-31 LAB — BMP (EXT)
Anion Gap (EXT): 10 mmol/L (ref 7–15)
BUN (EXT): 13 mg/dL (ref 9–20)
CO2 (EXT): 17 mmol/L — ABNORMAL LOW (ref 22–32)
CalciumCalcium (EXT): 8.8 mg/dL (ref 8.4–10.6)
Chloride (EXT): 110 mmol/L — ABNORMAL HIGH (ref 98–107)
Creatinine (EXT): 0.5 mg/dL — ABNORMAL LOW (ref 0.66–1.25)
Glucose (EXT): 98 mg/dL (ref 60–100)
Potassium (EXT): 4.1 mmol/L (ref 3.5–5.5)
Sodium (EXT): 137 mmol/L (ref 135–144)
eGFR - Creat CKD-EPI (EXT): >90 mL/min/{1.73_m2} (ref >=60)

## 2021-09-01 ENCOUNTER — Emergency Department (HOSPITAL_BASED_OUTPATIENT_CLINIC_OR_DEPARTMENT_OTHER): Payer: Medicaid Other

## 2021-09-01 ENCOUNTER — Emergency Department
Admission: EM | Admit: 2021-09-01 | Discharge: 2021-09-01 | Discharge status: Against Medical Advice | Payer: Medicaid Other | Attending: Emergency Medicine | Admitting: Emergency Medicine

## 2021-09-01 DIAGNOSIS — M546 Pain in thoracic spine: Secondary | ICD-10-CM | POA: Insufficient documentation

## 2021-09-01 DIAGNOSIS — Z79899 Other long term (current) drug therapy: Secondary | ICD-10-CM | POA: Insufficient documentation

## 2021-09-01 DIAGNOSIS — Z7982 Long term (current) use of aspirin: Secondary | ICD-10-CM | POA: Insufficient documentation

## 2021-09-01 DIAGNOSIS — S0990XA Unspecified injury of head, initial encounter: Secondary | ICD-10-CM

## 2021-09-01 DIAGNOSIS — I252 Old myocardial infarction: Secondary | ICD-10-CM | POA: Insufficient documentation

## 2021-09-01 DIAGNOSIS — Z8249 Family history of ischemic heart disease and other diseases of the circulatory system: Secondary | ICD-10-CM | POA: Insufficient documentation

## 2021-09-01 DIAGNOSIS — I10 Essential (primary) hypertension: Secondary | ICD-10-CM | POA: Insufficient documentation

## 2021-09-01 DIAGNOSIS — Z0189 Encounter for other specified special examinations: Secondary | ICD-10-CM

## 2021-09-01 DIAGNOSIS — K029 Dental caries, unspecified: Secondary | ICD-10-CM | POA: Insufficient documentation

## 2021-09-01 DIAGNOSIS — M542 Cervicalgia: Secondary | ICD-10-CM | POA: Insufficient documentation

## 2021-09-01 DIAGNOSIS — Y92480 Sidewalk as the place of occurrence of the external cause: Secondary | ICD-10-CM | POA: Insufficient documentation

## 2021-09-01 DIAGNOSIS — Z043 Encounter for examination and observation following other accident: Secondary | ICD-10-CM

## 2021-09-01 DIAGNOSIS — S022XXA Fracture of nasal bones, initial encounter for closed fracture: Secondary | ICD-10-CM | POA: Insufficient documentation

## 2021-09-01 DIAGNOSIS — R519 Headache, unspecified: Secondary | ICD-10-CM | POA: Insufficient documentation

## 2021-09-01 MED ORDER — ACETAMINOPHEN 325 MG PO TABS
975.0000 mg | ORAL_TABLET | Freq: Once | ORAL | Status: DC
Start: 2021-09-01 — End: 2021-09-02

## 2021-09-01 MED ORDER — GENERIC EXTERNAL MEDICATION
5.00 mg/h | Status: DC
Start: ? — End: 2021-09-01

## 2021-09-01 MED ORDER — ONDANSETRON HCL 4 MG/2ML IV SOLN
4.00 mg | INTRAMUSCULAR | Status: DC
Start: ? — End: 2021-09-01

## 2021-09-01 MED ORDER — BACLOFEN 20 MG OR TABS
20.00 mg | ORAL_TABLET | ORAL | Status: DC
Start: ? — End: 2021-09-01

## 2021-09-01 MED ORDER — GENERIC EXTERNAL MEDICATION
Status: DC
Start: ? — End: 2021-09-01

## 2021-09-01 MED ORDER — SODIUM CHLORIDE 0.9 % IV SOLN
INTRAVENOUS | Status: DC
Start: ? — End: 2021-09-01

## 2021-09-01 MED ORDER — LABETALOL HCL 5 MG/ML IV SOLN
10.00 mg | INTRAVENOUS | Status: DC
Start: ? — End: 2021-09-01

## 2021-09-01 NOTE — ED Notes (Signed)
[employer] Case number [id number]   [name] [name] 1797 talked with patient
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
## 2021-09-01 NOTE — ED Notes (Signed)
Pt asked this Clinical research associate for an AMA form. Stated he was leaving, denied any medical concerns, primary RN notified, AMA form placed in back RN station.

## 2021-09-01 NOTE — ED Notes (Signed)
C-Collar placed by medics
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
## 2023-01-24 NOTE — ED Notes (Signed)
 Formatting of this note might be different from the original.  Bed: C13-0  Expected date:   Expected time:   Means of arrival:   Comments:  OFD 1 52 y/o Chest pain, palpations A fib 110-120 98/68 324mg  asa 4mg  zofran given     Nonah Mattes, RN  01/24/23 1157    Electronically signed by Nonah Mattes, RN at 01/24/2023 11:57 AM CST

## 2023-01-24 NOTE — Consults (Signed)
 Formatting of this note is different from the original.  ACADEMIC GENERAL CARDIOLOGY CONSULT NOTE - [facility]   Consultation Date: 01/24/2023   Referring Physician: Dr. Jenita Seashore, DO   Consulting Physician: Dr. Tona Sensing  Reason for consult:  afib and chest pain    History of Present Illness   Miguel Carr is a 52 y.o. male with a past medical history significant for Afib on [facility], CAD, CHF, PTSD, hypertension, hyperlipidemia who presents with afib and chest pain. He is on Xarelto for [facility] and Toprol 50mg  XL. Patient states he has had off and on chest pain and palpitations that can wake him from sleep x1 week. In the Ed patient had a troponin of 6 and patient refused delta troponin. EKG showed Afib w/ HR of 110. On telemetry patient occasionally had a HR briefly >120 but was mostly around the  upper 90s and lower 100s. He was also seen 3 days ago for similar symptoms with negative ACS workup that he left AMA. Patient repeatedly asked for pain medications during the interview.     Review of Systems   14 systems were reviewed and negative except as noted in HPI.    Past Medical History, Surgical History, Family History, Social History, and Allergies   Reviewed. See HPI for pertinent information.     Objective   Vitals:  BP 101/64   Pulse 100   Temp 36.8 C (Oral)   Resp 15   Wt 191 lb 2.2 oz (86.7 kg)   SpO2 96%   BMI 25.22 kg/m      Physical Exam:  General Appearance: NAD. Alert. Cooperative.   HEENT: Normocephalic. Atraumatic. EOM intact.  Heart: Irregularly irregular   Lungs: CTAB. Non-labored and regular rate on room air  Abdomen: Soft. Non-tender to palpation.  Extremities: No cyanosis. Atraumatic. No edema.  Pulses: 2+ radial pulses bilaterally  Skin: Warm and dry, no erythema or bruising.   Neurologic: Grossly normal. No focal neurologic deficits.     Most recent result (last 24 hours) CMP, CBC, ABG, other lab as labeled   138 106 19 9.3   15.4   6.7 21  - / - / - / -   4.6 22 0.72 88  6.2 >< 381  4.4 29   [Lactic acid: -]         47.2   0.5 125  [INR: -]  [PTT: -]   [Glucose meter: -] [Mg: -]   [Phos: -]   [Troponin: 6] [Delta: NOT CALCULATED] [CHF peptide: -]  [Lipid panel: 12/21/2022: Cholesterol (External) 111 mg/dL; HDL (External) 48 mg/dL]  [Thyroid function: No results found for requested labs within last 366 days.]  [Hemoglobin A1c: No results found for requested labs within last 366 days.]      Pertinent Cardiac History   EKG:   Hospital Encounter on 01/24/23 (from the past 84132 hours)   3 hour REPEAT Inpatient electrocardiogram 12 lead    Narrative    Ventricular Rate: 110BPM  QRS Duration: 90ms  Q-T Interval:  QTC Calculation(Bazett):  R Axis: 72degrees  T Axis: 57degrees  Atrial fibrillation with rapid ventricular response  Abnormal ECG  When compared with ECG of 24-Jan-2023 11:59, (unconfirmed)  No significant change was found       Last Echo:   Results for orders placed or performed during the hospital encounter of 04/28/18   [facility] Echocardiogram bubble study    Narrative    Transthoracic Echocardiography Report (TTE)  Demographics     Patient Name   [facility]  Gender              Male                  R     Patient Number [id number]       Race                Unknown                                  Ethnicity     Account Number [id number]     Room Number         3317                                  Date of Study       04/28/2018     Accession      [id number]     Referring Physician Darden Amber MD   Number     Date of Birth  [birth year]    Sonographer         Corp Coulter RDMS, RVT,                                                    RDCS     Age            59 year(s)    Interpreting        Daisey Must MD                                Physician    Procedure  Type of Study     TTE procedure:GP Echocardiogram Complete w/ Bubble Study.    Procedure Date  Date: 04/28/2018 Start: 04:48 PM  Study Location: [facility] IP PORTABLE  Technical Quality: Limited visualization due to combative  patient.  Indications:CVA.  Patient Status: Routine  Height: 73 inches Weight: 185 pounds BSA: 2.08 m^2 BMI: 24.41 kg/m^2  2D Measurements (cm)     LVIDd: 4.64 cm                         LVIDs: 3.64 cm   IVSd: 0.98 cm   LVPWd: 0.96 cm                         AO Root Dimension: 3.02 cm   Rt. Vent. Dimension: 2.91 cm           LA: 4.1 cm                                          LVOT Diameter: 2.12 cm    Doppler Measurements:     AV Velocity:120 cm/s                 MV Peak E-Wave: 56 cm/s   AV Peak Gradient: 5.76 mmHg          MV Peak A-Wave: 46 cm/s  AV Mean Gradient: 2.8 mmHg           MV E/A Ratio: 1.22 %   AV Area (Continuity):2.78 cm^2       MV Peak Gradient: 1.25 mmHg   AV VTI: 24.9 cm                      MV P1/2t: 58 msec   LVOT VTI: 19.6 cm                    MVA by PHT3.79 cm^2   LVOT Peak Velocity: 99 cm/s          PV Peak Velocity: 119 cm/s   LVOT Peak Gradient: 3.9 mmHg         PV Peak Gradient: 5.66 mmHg   LVOT Mean Velocity: 59 cm/s   LVOT Mean Gradient: 1.8 mmHg         Estimated RAP:10 mmHg     Tissue Doppler     E' Septal Velocity: 14 cm/s          A' Septal Velocity: 11 cm/s     Findings     Mitral Valve   No evidence of mitral valve stenosis.   Trace degree mitral valve regurgitation.     Aortic Valve   The aortic valve leaflets are not well visualized.   No evidence of aortic valve stenosis or regurgitation.     Tricuspid Valve   No evidence of tricuspid regurgitation.   Unable to estimate pulmonary artery pressure.     Pulmonic Valve   The pulmonic valve was not well visualized.   No evidence of pulmonic valve stenosis or regurgitation.     Left Atrium   Negative Bubble Study.   Left atrial volume is 24 ml/m2.   Normal size left atrium.     Left Ventricle   Left ventricle size is normal.   Normal left ventricular wall thickness.   Ejection Fraction is estimated at 65-70%.   Normal diastolic filling pattern.   E/E a(m) 4 .     Right Atrium   The right atrium appears enlarged.     Right  Ventricle   The right ventricle appears mildly dilated.     Pericardial Effusion   No evidence of pericardial effusion.     Pleural Effusion   No evidence of pleural effusion.     Miscellaneous   The ascending aorta appears normal size.     The IVC is normal.   IVC respiratory change in dimension > 50% .     Conclusions     Summary   Normal left ventricular systolic function with visually estimated ejection   fraction at 65-70%.   Concentric remodeling of the left ventricle (LV mass index 76 g/m2, RWT   0.43).   Mild right atrial and right ventricular enlargement.   No significant valvular abnormalities.   No evidence of pericardial effusion.   No evidence of spontaneous shunts across the atrial or ventricular septum   on bubble study with agitated saline.     Contractility Score     LV regional wall motion:(0-Non visualized 1-Normal 1'-Hyperkinesis   2-Hypokinesis 3-Akinesis 4-Dyskinesis 5-Aneurysm)    Signature  ----------------------------------------------------------------------------   Electronically signed by Daisey Must MD(Interpreting physician) on   04/28/2018 08:39 PM  ----------------------------------------------------------------------------       Last Heart Cath:   Reportedly 2 years ago, none found in records    Stress Test:   Reportedly completed  on 04/17/11 with negative result at OSH    Assessment and Recommendations   Miguel Carr is a 52 y.o. male with past medical history significant for Afib on [facility], CAD, CHF, PTSD, hypertension, hyperlipidemia who presents with afib and chest pain. Cardiology is consulted for afib and chest pain.  Afib with intermittent RVR seen on telemetry. Negative troponin and no significant ST elevation seen on EKG. Likely negative ACS workup however, patient refused delta troponin and left AMA before further assessment could be obtained.     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     Patient was discussed over the phone with attending physician, Dr. Tona Sensing, who agrees with the assessment and plan.  Please see their attestation for final recommendations.    Lanny Hurst, MD  Family Medicine, PGY-2  [facility] Consult Service  01/24/2023, 3:49 PM   Please contact via [employer]    Cosigned by Vilinda Blanks, DO at 01/25/2023  1:02 PM CST  Electronically signed by Lanny Hurst, MD at 01/24/2023  4:11 PM CST  Electronically signed by Vilinda Blanks, DO at 01/25/2023  1:02 PM CST    Associated attestation - Vilinda Blanks, DO - 01/25/2023  1:02 PM CST  Formatting of this note might be different from the original.  TEACHING PHYSICIAN ATTESTATION:    I reviewed the findings and discussed management of Miguel Carr with the house staff. I agree with the plan documented in the note which reflects our joint efforts, unless otherwise stated in this addendum.     Patient came in for symptomatic atrial fibrillation and chest pain.  Found to be in RVR.  We made our recommendations with increasing Toprol-XL and follow-up as an outpatient.  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  Patient left AMA prior to me seeing him.    Vilinda Blanks, DO  1:01 PM, 01/25/2023
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
## 2023-01-27 NOTE — ED Provider Notes (Signed)
 Formatting of this note is different from the original.  [facility] Emergency Department    ED Provider: Devoria Glassing, MD  Primary Care Provider: PCP Not Identified   Room: RM07/RM07-01      Chief Complaint      Chief Complaint   Patient presents with    Loss of Consciousness    Chest Pain     History of Present Illness     Miguel Carr is an anticoagulated (Xarelto) 52 y.o. male with history of MI, acute coronary syndrome, heart murmur, hypertension, MI, atrial fibrillation, TIA, COPD who presents for a syncopal episode that occurred at 13:30 today. The patient reported that he was walking into a store when he suddenly felt fluttering and palpitations in his chest. The patient felt lightheaded and fell to the ground, hitting his head. He explained that the next thing he remembers is being carried by the store workers. EMS was contacted to transport the patient to the ER. EMS noted that the patient appeared to be in atrial fibrillation. Currently the patient endorses a right sided headache. The patient stated that while he was waiting in the lobby he started to experience decreased sensation to his left side, left sided weakness, blurry vision, and slurred speech. He noted that this started around 13:30. The patient reported that he was discharged from both [facility] and [facility] psychiat[facility] over the past few days. The patient is currently taking Xarelto but he noted that he did not take his medications today. Denies recent fevers, chills, cough, sore throat, congestion, difficulty breathing, abdominal pain, nausea, vomiting, diarrhea, constipation, dysuria, hematuria. No other acute concerns at this time.    Per external record review, the patient was admitted from 01/26/23 - 01/27/23 for suicidal ideation. The patient planned to overdose on his cardiac medications. Psychiatry was consulted. He endorsed chronic thoughts of death but denies active suicidal ideation. He denied using psychotropic  medications as well as IV drugs. The patient stated that he is currently homeless. He explained that he plans to continue traveling once he is discharged. He worked on a [employer] and was given information for follow up locations.     Review of Systems   All other systems are negative, except as noted in the HPI.    Physical Exam   Nursing Notes and Vital Signs Reviewed  BP 110/74   Pulse 82   Temp 36.3 C (97.3 F) (Tympanic)   Resp 20   Ht 185.4 cm (6\' 1" )   Wt 83 kg (183 lb)   SpO2 96%   BMI 24.14 kg/m  Body mass index is 24.14 kg/m.     General: Alert, no acute distress  Skin: Warm, dry, intact  Eye: Extraocular movements are intact  Cardiovascular: Irregularly irregular and tachycardic, no peripheral edema  Respiratory: Symmetric chest rise, nonlabored respirations  Gastrointestinal: Soft, nondistended  Musculoskeletal: Normal range of motion, nontender  Neurologic: A&O, CNs II-XII grossly intact, no nystagmus. Endorses left upper extremity, left lower extremity, and left facial sensory deficits. Left lower extremity weakness against gravity. Left upper extremity decreased grip strength. When asked to puff out both cheeks he does not puff out left cheek.   Psychiatric: Cooperative, appropriate mood and affect    EKG    I personally reviewed and interpreted the following EKGs:  ECG Results       Procedure Component Value Ref Range Lab Date/Time    EKG 12-lead [[id number]] DMM TRACE Collected: 01/27/23 1328  Order Status: Completed Lab Status: Final result Updated: 01/27/23 1657     Heart Rate 121 bpm DMM TRACE      PR Interval -- ms DMM TRACE      Frontal Axis:P -- deg DMM TRACE      QRS Duration 95 ms DMM TRACE      QT Interval 316 ms DMM TRACE      QT Corrected 449 ms DMM TRACE      Frontal Axis: Mean QRS 51 deg DMM TRACE      Frontal Axis: Terminal 40 ms. 39 deg DMM TRACE      Frontal Axis Terminal 40ms. 104 deg DMM TRACE      Frontal Axis:T 17 deg DMM TRACE      Frontal Axis: ST -34 deg DMM  TRACE     Narrative:      Atrial fibrillation  Interpreted at 1333. No STEMI. A fib with rate of 121.  Electronically signed by: Jahansouz, Arian N. 01-27-2023 16:55:23         ED Course and Medical Decision Making     52 y.o. male presenting to the emergency department with concern of a syncopal episode.  Differential diagnosis considered includes but is not limited to: reflex mediated (vasovagal, carotid sinus, situational), cardiac cause (valvular disease, PE, dissection, ACS, arrhythmia), orthostatic syncope, cerebrovascular, seizure, hypoglycemia, drug/alcohol use   Vitals and exam as above, significant for initially tachycardic in a fib, resolved without intervention. Endorsing left sided deficits.   Given complexity of concerns and patient's risk, plan for management and testing with ACS and stroke rule-out with labs and imaging.   I independently reviewed and interpreted the patient's EKG, see interpretation above.  On my independent interpretation of results, lab work significant for 2 negative troponins  Imaging significant for no acute findings   Patient informed of findings. On re-evaluation, resting comfortably and left sided deficits resolved. Patient requesting dinner.   Patient given return precautions and instructed to follow-up with PCP and specialists as needed. Patient expressed understanding and is agreeable with this plan at this time. Patient discharged in stable condition.    Independent Sources of History:  Patient provides HPI and EMS provides pre-hospital information  Record Review Summary: See HPI  Results discussed with independent provider: N/A   Chronic Illness impacting care: See HPI   Social Determinants of Health Impacting Care: Tobacco use, drug use (marijuana), housing instability   Discussion of management with other providers: None  Diagnostic tests considered: Further advanced imaging not indicated at this time  Consideration of admission: Considered if not improved or found to  have condition requiring longer term inpatient management   Prescriptions medications considered:  None  Discharge Medication List as of 01/27/2023  4:54 PM       Clinical decision rules used: N/A        Procedure  /  Critical Care Time   N/A  Procedures    Patient Rechecks     ED Course as of 01/27/23 1822   Tue Jan 27, 2023   1411 CT Head or Brain wo Contrast  IMPRESSION:  1. No CT evidence of acute intracranial abnormality.  2. Left zygomatic arch fracture. Consider dedicated maxillofacial CT.   [AJ]   1521 CT Angiography Head Neck w wo Contrast  IMPRESSION:  No significant focal stenosis or occlusion of major arteries of the  head or neck.   [AJ]   1522 CT Maxillofacial wo Contrast  IMPRESSION:  1.  No acute maxillofacial fracture.    2.  Evidence of several periapical lucencies of left maxillary and  bilateral mandibular teeth, which is compatible with periodontal  disease. Consider further evaluation with a dental consultation.   [AJ]   1523 XR Chest 1 View  IMPRESSION:  No acute findings in the chest.   [AJ]   1724 The patient is in no acute distress. I have spoken with the patient/family about today's findings. Counseling was provided regarding diagnosis and prognosis. Specific details for the plan of care were discussed, and there is agreement with the plan. Conditions for return, as well as importance of follow-up, were emphasized. The patient's vital signs were reviewed and they are stable for discharge.    [JN]     ED Course User Index  [AJ] Devoria Glassing, MD  [JN] Allyson Sabal, ED Scribe     Clinical Impression:  1. Syncope, unspecified syncope type      Discharge Medications:  Discharge Medication List as of 01/27/2023  4:54 PM       Disposition: Discharge    Follow-Up Providers       Establish care      Next Steps: Schedule an appointment as soon as possible for a visit in 3 day(s)    Instructions: Visit the website above to find the first available doctor    [facility] Emergency Department   Specialty: Emergency Medicine    Phone: [phone]      Next Steps: Follow up    Instructions: If symptoms worsen         Lab Results (Abnormals noted)   I personally reviewed and interpreted the following labs:  Labs Reviewed   PROTIME-INR - Abnormal; Notable for the following components:       Result Value    Protime 22.6 (*)     All other components within normal limits   COMPREHENSIVE METABOLIC PANEL - Abnormal; Notable for the following components:    CO2 20 (*)     Glucose 117 (*)     Bilirubin Total 0.2 (*)     Alkaline Phosphatase 153 (*)     All other components within normal limits   POCT GLUCOSE - Abnormal; Notable for the following components:    Glucose, POC 129 (*)     All other components within normal limits   CBC AND DIFFERENTIAL   TROPONIN T 5TH GEN   TROPONIN T 5TH GEN     Imaging    I personally reviewed images and reviewed the radiologist interpretation.    Medications Given     ED Medication Administration from 01/27/2023 1310 to 01/27/2023 1822         Date/Time Order Dose Route Action     01/27/2023 1435 CST iohexol (OMNIPAQUE) 350 MG/ML injection 80 mL Intravenous Given           The Following was reviewed during visit     Past Medical History:   Diagnosis Date    ACS (acute coronary syndrome)     ADHD (attention deficit hyperactivity disorder)     Atrial fibrillation     Broken collarbone     Heart murmur     Hypertension     MI (myocardial infarction) 03/17/2010    Myocardial infarct      Past Surgical History:   Procedure Laterality Date    BACK SURGERY      head reconstruction      KNEE  SURGERY       Family History   Family history unknown: Yes     Social History     Tobacco Use    Smoking status: Every Day     Current packs/day: 0.50     Average packs/day: 0.5 packs/day for 45.9 years (22.9 ttl pk-yrs)     Types: Cigarettes     Start date: 1979    Smokeless tobacco: Never   Substance Use Topics    Alcohol use: No    Drug use: Not Currently     Types:  Marijuana     Comment: pain meds at [facility]     ____________________________________________________________________    Loman Chroman, ED Scribe, attest that this documentation has been prepared under the direction and in the presence of Devoria Glassing, MD  Electronically Signed: Allyson Sabal, ED Scribe. 01/27/2023. 2:01 PM.    This chart was generated using Psychiatrist. Although every effort was made to ensure accuracy, inadvertent transcription errors may be present.    Devoria Glassing, MD  01/27/23 1822    Electronically signed by Devoria Glassing, MD at 01/27/2023  6:22 PM CST

## 2023-05-05 LAB — BMP (EXT)
Anion Gap (EXT): 10 mmol/L (ref 4–16)
BUN (EXT): 15 mg/dL (ref 7–25)
CO2 (EXT): 22 mmol/L (ref 21–31)
CalciumCalcium (EXT): 8.9 mg/dL (ref 8.6–10.3)
Chloride (EXT): 104 mmol/L (ref 98–108)
Creatinine (EXT): 0.7 mg/dL (ref 0.70–1.30)
Glucose (EXT): 91 mg/dL (ref 70–199)
Potassium (EXT): 4 mmol/L (ref 3.5–5.1)
Sodium (EXT): 136 mmol/L (ref 133–145)
eGFR - Creat MDRD (EXT): >90 mL/min/1.73 "square meters" (ref >=60)

## 2023-05-19 NOTE — ED Provider Notes (Signed)
 Formatting of this note is different from the original.  Images from the original note were not included.        EMERGENCY MEDICINE NOTE  [name] Age 53 y.o. Sex male   MRN [id number] Encounter Date 05/19/2023    CC  Chief Complaint   Patient presents with    Chest Pain       HPI  53 year old male presents to the emergency department complaining of acute exacerbation of his chronic AFib.  Patient has a longstanding history of chronic AFib with RVR.  He states that he was admitted to the hospital here a couple of days ago and left AMA.  He declined any further workup.  He states that he had a "panic attack" today after missing his bus and noticed that his heart rate was happened was brought to the emergency department.  Currently patient had chest pain earlier at the bus stop due to his anxiety of missing the bus but states it is much improved he was short of breath but that has much improved he still does have some slight chest discomfort.  He is does report some anxiety.  He denies vomiting diarrhea          ROS  Review of Systems   Constitutional:  Negative for activity change, appetite change, fatigue and fever.   Respiratory:  Negative for shortness of breath.    Cardiovascular:  Negative for chest pain.   Gastrointestinal:  Negative for abdominal pain.   Neurological:  Negative for syncope, speech difficulty, weakness, light-headedness, numbness and headaches.     ALLERGIES  Allergies   Allergen Reactions    Adhesive Rash    Benadryl [Diphenhydramine Hcl]     Ketorolac Tromethamine Rash    Tramadol Hives     Reaction:Hives, ONSET:  torad    Ketorolac Hives     Reaction:Hives, ONSET:  3 yrs     MEDICATIONS  No current facility-administered medications on file prior to encounter.     Current Outpatient Medications on File Prior to Encounter   Medication Sig Dispense Refill    albuterol HFA (Ventolin HFA) 90 mcg/actuation inhaler Inhale 2 puffs every 6 (six) hours if needed for wheezing.       atorvastatin (LIPITOR) 20 mg tablet Take 1 tablet (20 mg total) by mouth 1 (one) time each day.      metoprolol succinate XL (TOPROL-XL) 100 mg 24 hr tablet Take 1 tablet (100 mg total) by mouth in the morning and 1 tablet (100 mg total) before bedtime.      nitroglycerin (NITROSTAT) 0.6 mg SL tablet Place 1 tablet (0.6 mg total) under the tongue every 5 (five) minutes if needed for chest pain.      rivaroxaban (Xarelto) 20 mg tablet Take 1 tablet (20 mg total) by mouth 1 (one) time each day.       Patient History     PMH  Past Medical History:   Diagnosis Date    Coronary arteriosclerosis     Essential hypertension     Hyperlipidemia     Myocardial infarct  (HCC)     Stroke  (HCC)      PSH  Past Surgical History:   Procedure Laterality Date    BACK SURGERY      KNEE ARTHROSCOPY      SHOULDER SURGERY       FH  Family History   Problem Relation Age of Onset    No Known Problems Mother  Heart disease Father      SH  Social History     Tobacco Use    Smoking status: Every Day     Current packs/day: 0.50     Types: Cigarettes    Smokeless tobacco: Never   Substance Use Topics    Alcohol use: Not Currently    Drug use: Not Currently     Types: Marijuana       EXAM  Physical Exam   ED Triage Vitals [05/19/23 1822]   Temp Heart Rate Resp BP SpO2   36.8 C (98.3 F) 56 20 119/78 96 %     Temp Source Heart Rate Source Patient Position BP Location FiO2 (%)   Oral Monitor Lying Right arm --     Vitals:    05/19/23 2015 05/19/23 2030 05/19/23 2045 05/19/23 2115   BP: 118/78 106/74 103/69 99/86   BP Location:       Patient Position:       Pulse: (!) 126 100 97 99   Resp: (!) 24  20 (!) 24   Temp:       TempSrc:       SpO2: 96% 98% 96% 96%     Physical Exam  Constitutional:       General: He is not in acute distress.     Appearance: He is well-developed. He is not ill-appearing, toxic-appearing or diaphoretic.   HENT:      Head: Normocephalic and atraumatic.   Eyes:      Extraocular Movements: Extraocular movements intact.       Pupils: Pupils are equal, round, and reactive to light.   Cardiovascular:      Rate and Rhythm: Regular rhythm. Tachycardia present.   Pulmonary:      Breath sounds: Normal breath sounds.   Abdominal:      Palpations: Abdomen is soft.   Musculoskeletal:      Cervical back: Normal range of motion and neck supple.   Skin:     General: Skin is warm and dry.   Neurological:      General: No focal deficit present.      Mental Status: He is alert and oriented to person, place, and time.   Psychiatric:      Comments: Positive anxiousness no suicidal or homicidal ideations         LABS  Labs Reviewed   CBC NO DIFF - Abnormal       Result Value    WBC 10.44      RBC 5.31      Hemoglobin 15.3      Hematocrit 45.6      MCV 86      MCH 28.8      MCHC 33.6      MPV 9.7      Platelets 300      RDW 15.6 (*)     RDW-SD 48.8 (*)     Nucleated RBC, Absolute 0.00      Nucleated RBC 0.0     BASIC METABOLIC PANEL - Abnormal    Sodium 138      Potassium 4.0      Chloride 112 (*)     CO2 22      Anion Gap 4 (*)     BUN 19      Creatinine 0.79      Glucose 98      Calcium 8.7      Estimated GFR 108     TROPONIN,  HIGH SENSITIVITY - Normal    High Sensitivity Troponin 7.0       ECG  ECG 12 lead       IMAGING  X-Ray Chest 1 View   Final Result       PROCEDURES  Procedures                    MDM    Diagnostic considerations and differential diagnoses:  Complexity of problem:   - Acute onset of exacerbated AFib posing potential threat to cardiopulmonary body function    Data analyzed:   Category 1:  - Previous hospitalist notes are reviewed  Multiple laboratory studies are initiated  Radiologic studies initiated    Category 2: Interpretation  CBC reveals unremarkable  BMP reveals unremarkable  Troponin reveals 7.0  EKG reveals atrial fib chronic no RVR per Dr. Mayford Knife ERP  Chest x-ray reveals interpreted by radiologist no acute findings    Category 3: Consultation  - Case was discussed with Dr. Mayford Knife ERP okay to discharge    Patient placed on  cardiac monitor blood pressure monitor pulse oximetry IV was placed.  Patient given Ativan 1 mg IV for anxiety.  Patient declined the Ativan.  States that he has anxiety has resolved he does not need it he is currently asymptomatic at this time he is requesting a box lunch.    ED COURSE AND IMPRESSION  Clinical Impressions as of 05/19/23 2136   Chronic atrial fibrillation  [facility])   Homelessness         ED Medication Administration from 05/19/2023 1809 to 05/19/2023 2136         Date/Time Order Dose Route Action Action by     05/19/2023 2128 CST LORazepam (ATIVAN) injection 1 mg 1 mg intravenous Not Given Mannie Stabile         ED Prescriptions    None        Gus Height, PA-C  05/19/23 2136    Cosigned by Emmaline Kluver, MD at 05/19/2023  9:59 PM CST  Electronically signed by Gus Height, PA-C at 05/19/2023  9:36 PM CST  Electronically signed by Emmaline Kluver, MD at 05/19/2023  9:59 PM CST

## 2023-05-19 NOTE — ED Triage Notes (Signed)
 Formatting of this note might be different from the original.  ED Triage Note    Pt DC from [facility] and they brought him here. Pt grabbing chest and reports CP starting 5 minutes ago pain L side of chest and radiates down arm . Pt admitted 3/2 for afib RVR. Pt left AMA after staying one night due to uncontrolled a fib. Pt a/o x4.     Electronically signed:   05/19/23  6:16 PM  Electronically signed by Leverne Humbles, RN at 05/19/2023  6:18 PM CST  Electronically signed by Leverne Humbles, RN at 05/19/2023  6:28 PM CST

## 2023-05-19 NOTE — Progress Notes (Signed)
 Formatting of this note might be different from the original.  Exam completed without incident  Electronically signed by Kavin Leech at 05/19/2023  8:20 PM CST

## 2023-05-22 NOTE — Unmapped (Signed)
 Formatting of this note might be different from the original.  Brought by EMS,[place] patient having chest pain.EMS notice a patch Maximilliano on chest and back which patient have been cardioverted 2 days ago in [place] and found out that patient went [facility] this morning and discharge him which patient did not elaborate.EMS got normal sinus rhythm and given aspirin 324 mg.    Pt complains chest pain radiates to jaw and left arm and rapid heart rate started half hour ago,dizziness and nausea.known 3 heart attack no cardiac stent,stroke 2024.had syncope episode 2 months ago,taking xarelto known afib  Electronically signed by Drema Balzarine, RN at 05/22/2023  2:36 PM PST

## 2023-05-22 NOTE — ED Provider Notes (Signed)
 Formatting of this note is different from the original.  [facility]  [address]  [place] [address]  [phone]    History  Chief Complaint   Patient presents with    Chest Pain     53 year old male presents with a chief complaint of chest pain.  Patient has extensive history of multiple ER visits across the country with similar complaints.  Patient states that he was seen at [facility] earlier in the day for AFib and was cardioverted.  The patient also states recently seen at [facility] as well.  Patient states he has chest pain that goes into his jaw and arm with a history of cardiac disease.  Patient denies shortness of breath.    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    Respiratory:  Negative for shortness of breath.    Cardiovascular:  Positive for chest pain.   Gastrointestinal:  Negative for vomiting.   Psychiatric/Behavioral:  Negative for suicidal ideas.    All other systems reviewed and are negative.    Physical Exam  BP 105/67   Pulse 93   Temp 98.7 F (37.1 C) (Temporal)   Resp 20   Ht 6\' 1"  (1.854 m)   Wt 81.6 kg (180 lb)   SpO2 97%   BMI 23.75 kg/m     Physical Exam  Vitals and nursing note reviewed.   Constitutional:       Appearance: He is well-developed.   HENT:      Head: Normocephalic.   Eyes:      Pupils:  Pupils are equal, round, and reactive to light.   Cardiovascular:      Rate and Rhythm: Normal rate and regular rhythm.      Heart sounds: Normal heart sounds.   Pulmonary:      Effort: Pulmonary effort is normal.      Breath sounds: Normal breath sounds.   Abdominal:      Palpations: Abdomen is soft.      Tenderness: There is no abdominal tenderness.   Musculoskeletal:         General: Normal range of motion.      Cervical back: Normal range of motion and neck supple.   Skin:     General: Skin is warm and dry.      Capillary Refill: Capillary refill takes less than 2 seconds.   Neurological:      General: No focal deficit present.      Mental Status: He  is alert.   Psychiatric:      Comments: Denies suicidal ideation     Results  Labs Reviewed   TROPONIN I - Normal       Result Value    HS Troponin I 7      Narrative:     High-sensitivity Troponin I (hsTnI) assay can reliably detect low troponin concentrations relative to conventional troponin assays. It is the preferred marker of myocardial necrosis as recommended by the Fourth Universal Definition of Myocardial Infarction Guidelines.    The diagnosis of acute myocardial infarction (AMI) is made based on a rise or fall of troponin with at least one measurement exceeding the laboratory's upper limit of normal(indicating myocardial injury), in the context of reasonable suspicion for coronary ischemia (e.g. typical symptoms, changes on ECG, evidence for loss of myocardial function or demonstration of obstructive coronary artery disease).    NOTE: Although abnormal hsTnI values reflect INJURY to myocardial cells, an elevated hsTnI does not indicate the CAUSE of injury (i.e. ischemia versus non-ischemic disease).    In some cases, myocardial injury is chronic and relatively stable such that hsTnI values remain elevated but do not change substantially over hours to days (examples include chronic kidney disease, heart failure or advanced patient age).    When determining  whether there has been a significant rise or fall of troponin on serial sampling, absolute change in troponin concentration has greater diagnostic accuracy for AMI than relative change criteria. A change of >7 ng/L over a 2-hour interval or a change of >10 ng/L over a 3-hour interval is suggested as a significant change.  If the initial hsTnI is below or slightly above the 99th percentile upper reference limit, a 50% change from the baseline is considered significant.  If the initial hsTnI is above the 99th percentile upper reference limit, a 20% change from the baseline value is considered significant.     Imaging  Imaging Results    None      ED Course  ED Course as of 05/22/23 1738   Fri May 22, 2023   1650 After getting consent from the patient I called [facility] and spoke with the ER provider who reviewed the patient's record from earlier in the day and states the patient was not cardioverted and was in sinus while he was in the ER although he had a reported history of AFib with a RVR in the field.  Patient had 2- troponins, unremarkable CBC, chemistry and chest x-ray at [facility] earlier today [SC]   1737 Patient reassessed in his sleeping peacefully.  Troponin negative, EKG unremarkable [SC]     ED Course User Index  [SC] Vinnie Langton, MD     Procedures    MDM  Number of Diagnoses or Management Options  Chest pain  Diagnosis management comments:   Relevant history includes: Refer to HPI and review of systems, chest pain, multiple ER visits recently    Differential diagnosis included:  Chest pain AC'S, malingering    Comorbidities affecting management:  Heart disease, AFib    Social history and social determinants of care that are affecting care:  Orthoptist, substance abuse, cigarette smoking    Prior medical records review and external chart review:  Multiple visits for various complaints throughout the country, visits for suicidal ideation, manipulative behavior at hospital and speak of four days ago  reviewed records from [place], EKG, troponin x2 negative, chemistry, CBC, chest x-ray negative    Independent historian(s) and additional history  obtained from:  EMS    Consultations and case reviewed:  Not applicable    Case discussed with admitting provider:  Not applicable    PMP reviewed with findings:  Not applicable    EKG/rhythm strip reviewed:  Normal sinus rhythm with a rate of 87  EKG/rhythm strip independently reviewed and interpreted by me and shows:  EKG time 4:38 p.m., normal sinus rhythm with a rate 87, normal axis, normal intervals, normal ST segments, no STEMI    Lab results reviewed by me and significant results include:  Troponin negative    Radiology studies independently reviewed and interpreted by me in coordination with radiologist official interpretation:  Not applicable    Considered and unordered tests/meds:  Not applicable    Shared decision making and disposition considerations:    medically and hemodynamically stable at time of disposition. With shared decision making, discussed plan to discharge patient home with medication and follow up with the physicians as noted below. Patient instructed on the importance of ensuring that this follow up occurs as instructed. Patient given verbal instructions at time of discharge in addition to written discharge instructions. Strict ED return precautions given. Patient voiced understanding and agreement with plan. All questions answered.    Multiple visits for chest pain, negative troponin x3 today.  Reviewed the prior records.  Clinically doubt acute coronary syndrome.  Resting comfortably in no distress on reassessment.  Stable for discharge    ED Disposition  Discharge    Final diagnoses:   Chest pain      Follow-up Information       [facility] In 2 days.    Contact information  [address]  [place] [address]  [phone]                      Vinnie Langton, MD  05/22/23 [phone]    Electronically signed by Vinnie Langton,  MD at 05/22/2023  3:38 PM PST

## 2023-06-02 LAB — CMP (EXT)
ALT/SGPT (EXT): 27 U/L (ref 11–51)
AST/SGOT (EXT): 25 U/L (ref 13–39)
Albumin (EXT): 4.4 g/dL (ref 3.5–5.0)
Alkaline Phosphatase (EXT): 100 U/L (ref 34–104)
Anion Gap (EXT): 9 mmol/L (ref 5–15)
BUN (EXT): 16 mg/dL (ref 7–25)
Bilirubin, Total (EXT): 0.4 mg/dL (ref 0.2–1.2)
CO2 (EXT): 25 mmol/L (ref 21–31)
CalciumCalcium (EXT): 8.7 mg/dL (ref 8.6–10.2)
Chloride (EXT): 101 mmol/L (ref 98–107)
Creatinine (EXT): 0.77 mg/dL (ref 0.60–1.30)
Glucose (EXT): 99 mg/dL (ref 70–100)
Potassium (EXT): 4.1 mmol/L (ref 3.5–5.1)
Protein (EXT): 7.1 g/dL (ref 6.4–8.3)
Sodium (EXT): 135 mmol/L — ABNORMAL LOW (ref 136–145)
eGFR - Creat MDRD (EXT): >90 mL/min/{1.73_m2} (ref >=60)

## 2023-06-08 LAB — EGFR (EXT): eGFR - Creat CKD-EPI (EXT): 111 (ref >1.73)

## 2023-06-10 LAB — BMP (EXT)
Anion Gap (EXT): 8 (ref 3–13)
BUN (EXT): 19 mg/dL (ref 10–25)
CO2 (EXT): 24 mmol/L (ref 21–35)
CalciumCalcium (EXT): 8.7 mg/dL (ref 8.2–10.2)
Chloride (EXT): 104 mmol/L (ref 98–111)
Creatinine (EXT): 0.73 mg/dL (ref <1.13)
Glucose (EXT): 113 mg/dL (ref 60–140)
Potassium (EXT): 4.7 mmol/L (ref 3.5–5.0)
Sodium (EXT): 136 mmol/L (ref 135–145)
eGFR - Creat CKD-EPI (EXT): 110 mL/min/{1.73_m2} (ref >60)

## 2023-06-12 LAB — EGFR (EXT): eGFR - Creat CKD-EPI (EXT): 81 mL/min/{1.73_m2} (ref >=60)

## 2023-06-12 LAB — CMP (EXT)
ALT/SGPT (EXT): 31 U/L (ref 10–49)
AST/SGOT (EXT): 29 U/L (ref <34)
Albumin (EXT): 4.2 g/dL (ref 3.5–4.9)
Alkaline Phosphatase (EXT): 105 U/L (ref 40–116)
Anion Gap (EXT): 7 mmol/L (ref 2–13)
BUN (EXT): 21 mg/dL — ABNORMAL HIGH (ref 8–20)
Bilirubin, Total (EXT): 0.2 mg/dL (ref 0.2–1.2)
CO2 (EXT): 24 mmol/L (ref 20–31)
CalciumCalcium (EXT): 9.1 mg/dL (ref 8.6–10.3)
Chloride (EXT): 109 mmol/L — ABNORMAL HIGH (ref 98–108)
Creatinine (EXT): 1.1 mg/dL (ref 0.70–1.30)
Glucose (EXT): 109 mg/dL (ref 70–180)
Potassium (EXT): 4.6 mmol/L (ref 3.5–5.1)
Protein (EXT): 6.3 g/dL (ref 6.0–8.3)
Sodium (EXT): 140 mmol/L (ref 136–146)

## 2023-06-13 LAB — BMP (EXT)
Anion Gap (EXT): 9 mmol/L (ref 5–17)
BUN (EXT): 14 mg/dL (ref 7–25)
CO2 (EXT): 21 mmol/L (ref 20–29)
CalciumCalcium (EXT): 8.9 mg/dL (ref 8.5–10.5)
Chloride (EXT): 110 mmol/L (ref 98–111)
Creatinine, urine, random (INT/EXT): 0.7 mg/dL (ref 0.60–1.30)
Glucose (EXT): 96 mg/dL (ref 70–99)
Potassium (EXT): 4.1 mmol/L (ref 3.5–5.2)
Sodium (EXT): 140 mmol/L (ref 135–145)
eGFR - Creat MDRD (EXT): 112 mL/min/{1.73_m2} (ref >60)

## 2023-06-15 LAB — BMP (EXT)
Anion Gap (EXT): 6 (ref 3–13)
Anion Gap (EXT): 1 — ABNORMAL LOW (ref 3–13)
BUN (EXT): 17 mg/dL (ref 10–25)
BUN (EXT): 19 mg/dL (ref 10–25)
CO2 (EXT): 22 mmol/L (ref 21–35)
CO2 (EXT): 23 mmol/L (ref 21–35)
CalciumCalcium (EXT): 8.5 mg/dL (ref 8.2–10.2)
CalciumCalcium (EXT): 8.4 mg/dL (ref 8.2–10.2)
Chloride (EXT): 110 mmol/L (ref 98–111)
Chloride (EXT): 110 mmol/L (ref 98–111)
Creatinine (EXT): 0.62 mg/dL (ref <1.13)
Creatinine (EXT): 0.61 mg/dL (ref <1.13)
Glucose (EXT): 95 mg/dL (ref 60–140)
Glucose (EXT): 98 mg/dL (ref 60–140)
Potassium (EXT): 3.8 mmol/L (ref 3.5–5.0)
Potassium (EXT): 3.8 mmol/L (ref 3.5–5.0)
Sodium (EXT): 138 mmol/L (ref 135–145)
Sodium (EXT): 134 mmol/L — ABNORMAL LOW (ref 135–145)
eGFR - Creat CKD-EPI (EXT): 116 mL/min/{1.73_m2} (ref >60)
eGFR - Creat CKD-EPI (EXT): 116 mL/min/{1.73_m2} (ref >60)

## 2023-06-16 ENCOUNTER — Inpatient Hospital Stay
Admit: 2023-06-16 | Discharge: 2023-06-17 | Disposition: A | Discharge status: Home / Self Care | Attending: Emergency Medicine

## 2023-06-16 ENCOUNTER — Emergency Department: Admit: 2023-06-16

## 2023-06-16 DIAGNOSIS — R1013 Epigastric pain: Secondary | ICD-10-CM

## 2023-06-16 LAB — CMP (EXT)
A/G Ratio (EXT): 1.6 (ref 1.0–2.5)
ALT/SGPT (EXT): 33 U/L (ref 10–50)
AST/SGOT (EXT): 28 U/L (ref 10–50)
Albumin (EXT): 3.9 g/dL (ref 3.5–5.2)
Alkaline Phosphatase (EXT): 95 U/L (ref 40–129)
Anion Gap (EXT): 13 mmol/L (ref 9–16)
BUN (EXT): 17 mg/dL (ref 6–20)
Bilirubin, Total (EXT): 0.4 mg/dL (ref 0.0–1.2)
CO2 (EXT): 21 mmol/L (ref 20–31)
CalciumCalcium (EXT): 8.7 mg/dL (ref 8.6–10.4)
Chloride (EXT): 102 mmol/L (ref 98–107)
Creatinine (EXT): 0.6 mg/dL — ABNORMAL LOW (ref 0.7–1.2)
Estimated Average Glucose mg/dL (INT/EXT): 106 mg/dL — ABNORMAL HIGH (ref 74–99)
Potassium (EXT): 3.9 mmol/L (ref 3.7–5.3)
Protein (EXT): 6.3 g/dL — ABNORMAL LOW (ref 6.6–8.7)
Sodium (EXT): 136 mmol/L (ref 136–145)
eGFR - Creat MDRD (EXT): >90 mL/min/{1.73_m2} (ref >60)

## 2023-06-16 LAB — CBC WITH AUTO DIFFERENTIAL
Basophils %: 0 % (ref 0–2)
Basophils Absolute: 0 10*3/uL (ref 0.0–0.2)
Eosinophils %: 0 % — ABNORMAL LOW (ref 1–4)
Eosinophils Absolute: 0 10*3/uL (ref 0.0–0.4)
Hematocrit: 38 % — ABNORMAL LOW (ref 40.7–50.3)
Hemoglobin: 12.8 g/dL — ABNORMAL LOW (ref 13.0–17.0)
Immature Granulocytes %: 0 %
Immature Granulocytes Absolute: 0 10*3/uL (ref 0.00–0.30)
Lymphocytes %: 16 % — ABNORMAL LOW (ref 24–44)
Lymphocytes Absolute: 0.93 10*3/uL — ABNORMAL LOW (ref 1.0–4.8)
MCH: 28.4 pg (ref 25.2–33.5)
MCHC: 33.7 g/dL (ref 28.4–34.8)
MCV: 84.4 fL (ref 82.6–102.9)
MPV: 9.5 fL (ref 8.1–13.5)
Monocytes %: 7 % (ref 1–7)
Monocytes Absolute: 0.41 10*3/uL (ref 0.1–0.8)
NRBC Automated: 0 /100{WBCs}
Neutrophils %: 77 % — ABNORMAL HIGH (ref 36–66)
Neutrophils Absolute: 4.46 10*3/uL (ref 1.8–7.7)
Platelets: 250 10*3/uL (ref 138–453)
RBC: 4.5 m/uL (ref 4.21–5.77)
RDW: 16.4 % — ABNORMAL HIGH (ref 11.8–14.4)
WBC: 5.8 10*3/uL (ref 3.5–11.3)

## 2023-06-16 LAB — COMPREHENSIVE METABOLIC PANEL
ALT: 33 U/L (ref 10–50)
AST: 28 U/L (ref 10–50)
Albumin/Globulin Ratio: 1.6 (ref 1.0–2.5)
Albumin: 3.9 g/dL (ref 3.5–5.2)
Alkaline Phosphatase: 95 U/L (ref 40–129)
Anion Gap: 13 mmol/L (ref 9–16)
BUN: 17 mg/dL (ref 6–20)
CO2: 21 mmol/L (ref 20–31)
Calcium: 8.7 mg/dL (ref 8.6–10.4)
Chloride: 102 mmol/L (ref 98–107)
Creatinine: 0.6 mg/dL — ABNORMAL LOW (ref 0.7–1.2)
Est, Glom Filt Rate: >90 mL/min/{1.73_m2} (ref >60)
Glucose: 106 mg/dL — ABNORMAL HIGH (ref 74–99)
Potassium: 3.9 mmol/L (ref 3.7–5.3)
Sodium: 136 mmol/L (ref 136–145)
Total Bilirubin: 0.4 mg/dL (ref 0.0–1.2)
Total Protein: 6.3 g/dL — ABNORMAL LOW (ref 6.6–8.7)

## 2023-06-16 LAB — VENOUS BLOOD GAS, POINT OF CARE
HCO3, Venous: 32.3 mmol/L — ABNORMAL HIGH (ref 22.0–29.0)
O2 Sat, Ven: 84.5 % (ref 60.0–85.0)
PO2, Ven: 41.8 mmHg (ref 30.0–50.0)
Positive Base Excess, Ven: 9.8 mmol/L — ABNORMAL HIGH (ref 0.0–3.0)
pCO2, Ven: 35.2 mmHg — ABNORMAL LOW (ref 41.0–51.0)
pH, Ven: 7.571 — ABNORMAL HIGH (ref 7.320–7.430)

## 2023-06-16 LAB — ELECTROLYTES PLUS
POC Anion Gap: 5 mmol/L — ABNORMAL LOW (ref 7–16)
POC Chloride: 106 mmol/L (ref 98–107)
POC Potassium: 3.8 mmol/L (ref 3.5–4.5)
POC Sodium: 141 mmol/L (ref 138–146)
POC TCO2: 31 mmol/L — ABNORMAL HIGH (ref 22–30)

## 2023-06-16 LAB — POCT CREATININE: POC CREATININE WHOLE BLOOD: 0.67

## 2023-06-16 LAB — HGB/HCT
POC Hematocrit: 40 % — ABNORMAL LOW (ref 41–53)
POC Hemoglobin (calc): 13.5 g/dL (ref 13.5–17.5)

## 2023-06-16 LAB — CALCIUM, IONIC (POC): POC Ionized Calcium: 1.12 mmol/L — ABNORMAL LOW (ref 1.15–1.33)

## 2023-06-16 LAB — UREA N (POC): POC BUN: 16 mg/dL (ref 8–26)

## 2023-06-16 LAB — LIPASE: Lipase: 38 U/L (ref 13–60)

## 2023-06-16 LAB — TROPONIN
Troponin, High Sensitivity: 8 ng/L (ref 0–22)
Troponin, High Sensitivity: 11 ng/L (ref 0–22)

## 2023-06-16 LAB — POCT GLUCOSE: POC Glucose: 113 mg/dL — ABNORMAL HIGH (ref 74–100)

## 2023-06-16 LAB — CREATININE W/GFR POINT OF CARE
POC Creatinine: 0.7 mg/dL (ref 0.51–1.19)
eGFR, POC: >90 mL/min/{1.73_m2} (ref >60)

## 2023-06-16 LAB — LACTIC ACID,POINT OF CARE: POC Lactic Acid: 1 mmol/L (ref 0.56–1.39)

## 2023-06-16 MED ORDER — SODIUM CHLORIDE (PF) 0.9 % IJ SOLN
0.9 | INTRAMUSCULAR | Status: AC
Start: 2023-06-16 — End: 2023-06-16
  Administered 2023-06-16: 22:00:00 20 mg via INTRAVENOUS

## 2023-06-16 MED ORDER — IOPAMIDOL 76 % IV SOLN
76 | Freq: Once | INTRAVENOUS | Status: AC | PRN
Start: 2023-06-16 — End: 2023-06-16
  Administered 2023-06-16: 22:00:00 75 mL via INTRAVENOUS

## 2023-06-16 MED FILL — FAMOTIDINE (PF) 20 MG/2ML IV SOLN: 20 MG/2ML | INTRAVENOUS | Qty: 2 | Fill #0
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
## 2023-06-16 NOTE — Discharge Instructions (Addendum)
 You were seen and evaluated for abdominal pain.  Your symptoms improved with pain medication.  He will be provided with apixaban for Zofran (antinausea medication), that can be placed under your tongue for dissolving.  Your symptoms are likely related to a viral illness.  Please continue to hydrate with plenty of clear fluids.  You will be provided with a referral to our family medicine doctors in order to establish care if you wish.  It is always advised that you stop smoking as long-term smoking can have negative health consequences on your heart and lungs.  Please turn to the ER for any sudden chest pain, shortness of breath, or any other questions or concerns.

## 2023-06-16 NOTE — ED Notes (Signed)
 Pt discharged from ED requesting cab service to his friend's home he is staying with at [address] Trip # [phone]. [employer] utilized due to having no insurance.

## 2023-06-17 ENCOUNTER — Emergency Department: Admit: 2023-06-17

## 2023-06-17 ENCOUNTER — Inpatient Hospital Stay: Admit: 2023-06-17 | Discharge: 2023-06-17 | Disposition: A | Discharge status: Home / Self Care

## 2023-06-17 LAB — CMP (EXT)
ALT/SGPT (EXT): 41 U/L (ref 10–50)
AST/SGOT (EXT): 35 U/L (ref 10–50)
Albumin (EXT): 4.3 g/dL (ref 3.5–5.2)
Alkaline Phosphatase (EXT): 103 U/L (ref 40–129)
Anion Gap (EXT): 15 mmol/L (ref 9–16)
BUN (EXT): 14 mg/dL (ref 6–20)
Bilirubin, Total (EXT): 0.3 mg/dL (ref 0.0–1.2)
CO2 (EXT): 24 mmol/L (ref 20–31)
CalciumCalcium (EXT): 8.6 mg/dL (ref 8.6–10.4)
Chloride (EXT): 101 mmol/L (ref 98–107)
Creatinine (EXT): 0.7 mg/dL (ref 0.7–1.2)
Estimated Average Glucose mg/dL (INT/EXT): 90 mg/dL (ref 74–99)
Potassium (EXT): 4 mmol/L (ref 3.7–5.3)
Protein (EXT): 6.9 g/dL (ref 6.6–8.7)
Sodium (EXT): 140 mmol/L (ref 136–145)
eGFR - Creat MDRD (EXT): >90 mL/min/{1.73_m2} (ref >60)

## 2023-06-17 LAB — URINALYSIS WITH REFLEX TO CULTURE
Bilirubin, Urine: NEGATIVE
Glucose, Ur: NEGATIVE mg/dL
Leukocyte Esterase, Urine: NEGATIVE
Nitrite, Urine: NEGATIVE
Specific Gravity, UA: 1.034 — ABNORMAL HIGH (ref 1.000–1.030)
Urine Hgb: NEGATIVE
Urobilinogen, Urine: NORMAL EU/dL (ref 0.0–1.0)
pH, Urine: 6 (ref 5.0–8.0)

## 2023-06-17 LAB — COMPREHENSIVE METABOLIC PANEL
ALT: 41 U/L (ref 10–50)
AST: 35 U/L (ref 10–50)
Albumin: 4.3 g/dL (ref 3.5–5.2)
Alkaline Phosphatase: 103 U/L (ref 40–129)
Anion Gap: 15 mmol/L (ref 9–16)
BUN: 14 mg/dL (ref 6–20)
CO2: 24 mmol/L (ref 20–31)
Calcium: 8.6 mg/dL (ref 8.6–10.4)
Chloride: 101 mmol/L (ref 98–107)
Creatinine: 0.7 mg/dL (ref 0.7–1.2)
Est, Glom Filt Rate: >90 mL/min/{1.73_m2} (ref >60)
Glucose: 90 mg/dL (ref 74–99)
Potassium: 4 mmol/L (ref 3.7–5.3)
Sodium: 140 mmol/L (ref 136–145)
Total Bilirubin: 0.3 mg/dL (ref 0.0–1.2)
Total Protein: 6.9 g/dL (ref 6.6–8.7)

## 2023-06-17 LAB — CBC WITH AUTO DIFFERENTIAL
Basophils %: 1 % (ref 0–2)
Basophils Absolute: 0.04 10*3/uL (ref 0.0–0.2)
Eosinophils %: 2 % (ref 0–4)
Eosinophils Absolute: 0.07 10*3/uL (ref 0.0–0.4)
Hematocrit: 41.2 % (ref 41–53)
Hemoglobin: 14.1 g/dL (ref 13.5–17.5)
Lymphocytes %: 17 % — ABNORMAL LOW (ref 24–44)
Lymphocytes Absolute: 0.61 10*3/uL — ABNORMAL LOW (ref 1.0–4.8)
MCH: 29.5 pg (ref 26–34)
MCHC: 34.3 g/dL (ref 31–37)
MCV: 85.9 fL (ref 80–100)
MPV: 7.8 fL (ref 6.0–12.0)
Monocytes %: 10 % — ABNORMAL HIGH (ref 1–7)
Monocytes Absolute: 0.36 10*3/uL (ref 0.1–1.3)
Neutrophils %: 70 % — ABNORMAL HIGH (ref 36–66)
Neutrophils Absolute: 2.52 10*3/uL (ref 1.3–9.1)
Platelets: 247 10*3/uL (ref 150–450)
RBC: 4.79 m/uL (ref 4.5–5.9)
RDW: 17.6 % — ABNORMAL HIGH (ref 11.5–14.9)
WBC: 3.6 10*3/uL (ref 3.5–11.0)

## 2023-06-17 LAB — LIPASE: Lipase: 36 U/L (ref 13–60)

## 2023-06-17 LAB — PROTIME-INR
INR: 1
Protime: 14.1 s (ref 11.8–14.6)

## 2023-06-17 LAB — MICROSCOPIC URINALYSIS
Casts UA: 0 /LPF — AB
Epithelial Cells, UA: 0 /HPF
RBC, UA: 0 /HPF
WBC, UA: 0 /HPF — AB

## 2023-06-17 LAB — APTT: APTT: 29.7 s (ref 24.0–36.0)

## 2023-06-17 MED ORDER — SODIUM CHLORIDE 0.9 % IV BOLUS
0.9 | Freq: Once | INTRAVENOUS | Status: AC
Start: 2023-06-17 — End: 2023-06-17
  Administered 2023-06-17: 17:00:00 100 mL via INTRAVENOUS

## 2023-06-17 MED ORDER — MORPHINE SULFATE 4 MG/ML IV SOLN
4 | Freq: Once | INTRAVENOUS | Status: AC
Start: 2023-06-17 — End: 2023-06-16
  Administered 2023-06-17: 2 mg via INTRAVENOUS

## 2023-06-17 MED ORDER — NORMAL SALINE FLUSH 0.9 % IV SOLN
0.9 | INTRAVENOUS | Status: DC | PRN
Start: 2023-06-17 — End: 2023-06-17
  Administered 2023-06-17: 17:00:00 10 mL via INTRAVENOUS

## 2023-06-17 MED ORDER — FENTANYL CITRATE (PF) 50 MCG/ML IJ SOLN
50 | Freq: Once | INTRAMUSCULAR | Status: AC
Start: 2023-06-17 — End: 2023-06-17
  Administered 2023-06-17: 15:00:00 25 ug via INTRAVENOUS

## 2023-06-17 MED ORDER — IOPAMIDOL 76 % IV SOLN
76 | Freq: Once | INTRAVENOUS | Status: AC | PRN
Start: 2023-06-17 — End: 2023-06-17
  Administered 2023-06-17: 17:00:00 75 mL via INTRAVENOUS

## 2023-06-17 MED ORDER — ONDANSETRON 4 MG PO TBDP
4 | ORAL_TABLET | Freq: Three times a day (TID) | ORAL | 0 refills | Status: AC | PRN
Start: 2023-06-17 — End: ?

## 2023-06-17 MED FILL — MORPHINE SULFATE 4 MG/ML IV SOLN: 4 mg/mL | INTRAVENOUS | Qty: 1 | Fill #0

## 2023-06-17 MED FILL — FENTANYL CITRATE (PF) 50 MCG/ML IJ SOLN: 50 MCG/ML | INTRAMUSCULAR | Qty: 1 | Fill #0

## 2023-06-17 NOTE — ED Notes (Signed)
 [employer] called, transportation set up for pt. To get to shelter.

## 2023-06-17 NOTE — ED Provider Notes (Signed)
 EMERGENCY DEPARTMENT ENCOUNTER    Pt Name: Miguel Carr  MRN: [id number]  Birthdate 08/05/1970  Date of evaluation: 06/17/23  CHIEF COMPLAINT       Chief Complaint   Patient presents with    Abdominal Pain     RLQ - started 20 min. Prior to EMS  Seen at [facility] yesterday for same problem     HISTORY OF PRESENT ILLNESS   Patient presents with right lower quadrant pain.  He was seen at another emergency department yesterday for abdominal pain he said that that was upper abdominal pain.  He describes right lower quadrant pain that is sharp in nature.  He endorses nausea without vomiting.  He denies any testicular pain.  He denies constipation or diarrhea.  He denies fever or chills.  Patient said that he took nothing for the pain.    The history is provided by the patient.           REVIEW OF SYSTEMS     Review of Systems   Constitutional:  Negative for chills and fever.   Cardiovascular:  Negative for chest pain.   Gastrointestinal:  Positive for abdominal pain and nausea. Negative for constipation, diarrhea and vomiting.   Genitourinary:  Negative for dysuria, flank pain, penile pain and penile swelling.   Neurological:  Negative for headaches.     PASTMEDICAL HISTORY     Past Medical History:   Diagnosis Date    Hypertension     Hypertension     Myocardial infarct [facility])     Myocardial infarct [facility]) 2013     Past Problem List  Patient Active Problem List   Diagnosis Code    Chest pain R07.9    Stroke [facility]) I63.9     SURGICAL HISTORY     History reviewed. No pertinent surgical history.  CURRENT MEDICATIONS       Discharge Medication List as of 06/17/2023  1:16 PM        CONTINUE these medications which have NOT CHANGED    Details   ondansetron (ZOFRAN-ODT) 4 MG disintegrating tablet Take 1 tablet by mouth 3 times daily as needed for Nausea or Vomiting, Disp-21 tablet, R-0Print      nitroGLYCERIN (NITROSTAT) 0.4 MG SL tablet Take 0.4 mg by mouthHistorical Med      atorvastatin (LIPITOR) 20 MG tablet Take 20 mg by mouth  dailyHistorical Med      aspirin 81 MG chewable tablet Take 81 mg by mouth daily.      lisinopril (PRINIVIL;ZESTRIL) 10 MG tablet Take 10 mg by mouth daily Historical Med           ALLERGIES     is allergic to diphenhydramine, metoclopramide, ketorolac tromethamine, and tramadol.  FAMILY HISTORY     has no family status information on file.      SOCIAL HISTORY       Social History     Tobacco Use    Smoking status: Never   Substance Use Topics    Alcohol use: No    Drug use: No     Comment: used to medical marijuana for back pain     PHYSICAL EXAM     INITIAL VITALS: BP 104/64   Pulse 84   Temp 98.4 F (36.9 C) (Oral)   Resp 14   Ht 1.854 m (6\' 1" )   Wt 85.3 kg (188 lb)   SpO2 97%   BMI 24.80 kg/m    Physical Exam  Vitals and  nursing note reviewed.   Constitutional:       General: He is not in acute distress.     Appearance: He is not ill-appearing.   HENT:      Head: Normocephalic.      Right Ear: External ear normal.      Left Ear: External ear normal.      Nose: Nose normal.      Mouth/Throat:      Mouth: Mucous membranes are moist.   Eyes:      Extraocular Movements: Extraocular movements intact.      Pupils: Pupils are equal, round, and reactive to light.   Cardiovascular:      Rate and Rhythm: Normal rate and regular rhythm.      Pulses:           Radial pulses are 2+ on the right side and 2+ on the left side.        Dorsalis pedis pulses are 2+ on the right side and 2+ on the left side.        Posterior tibial pulses are 2+ on the right side and 2+ on the left side.      Heart sounds: Normal heart sounds.   Pulmonary:      Effort: Pulmonary effort is normal. No respiratory distress.   Abdominal:      General: There is no distension.      Palpations: Abdomen is soft.      Tenderness: There is no abdominal tenderness. There is no right CVA tenderness, left CVA tenderness, guarding or rebound.   Musculoskeletal:         General: Normal range of motion.      Cervical back: Normal range of motion.    Skin:     General: Skin is warm.   Neurological:      Mental Status: He is alert.         MEDICAL DECISION MAKING / ED COURSE:         1)  Number and Complexity of Problems Addressed at this Encounter  Problem List This Visit: Abdominal pain    Differential Diagnosis: Appendicitis, colitis, enteritis, testicular torsion    Patient presents with right lower quadrant pain.  He was seen at another emergency department yesterday for abdominal pain he said that that was upper abdominal pain.  He describes right lower quadrant pain that is sharp in nature.  He endorses nausea without vomiting.  He denies any testicular pain.  He denies constipation or diarrhea.  He denies fever or chills.  Patient said that he took nothing for the pain.    2)  Data Reviewed and Analyzed  (Lab and radiology tests/orders below in next section)    Patient has no signs of infectious etiology on urinalysis.  Lab results are unremarkable.    Imaging that is independently reviewed and interpreted by me as: CT abdomen/pelvis shows possibility of peptic ulcer or duodenal ulcer disease.  Otherwise no acute pathology identified.  Ultrasound shows no evidence of a testicular torsion      3)  Treatment and Disposition           Notified of his results.  He has been asked to follow-up with GI and he is agreeable with this plan.  Given appropriate follow-up instructions.  He is stable for discharge.    CRITICAL CARE:       PROCEDURES:    Procedures      DATA FOR LAB AND RADIOLOGY TESTS  ORDERED BELOW ARE REVIEWED BY THE ED CLINICIAN:    RADIOLOGY: All x-rays, CT, MRI, and formal ultrasound images (except ED bedside ultrasound) are read by the radiologist, see reports below, unless otherwise noted in MDM or here.  Reports below are reviewed by myself.  CT ABDOMEN PELVIS W IV CONTRAST Additional Contrast? None   Final Result   Stomach is dilated with an air-fluid level.  There is question be either   spasm or some mucosal thickening in the duodenum with  ulcer disease not   excluded.  Increased small bowel fluid similar to prior study.  Otherwise   unremarkable exam without evidence of other acute abdominopelvic process.         US SCROTUM W LIMITED DUPLEX   Final Result   Unremarkable testicular ultrasound with normal Doppler flow.             LABS: Lab orders shown below, the results are reviewed by myself, and all abnormals are listed below.  Labs Reviewed   CBC WITH AUTO DIFFERENTIAL - Abnormal; Notable for the following components:       Result Value    RDW 17.6 (*)     Neutrophils % 70 (*)     Lymphocytes % 17 (*)     Monocytes % 10 (*)     Lymphocytes Absolute 0.61 (*)     All other components within normal limits   URINALYSIS WITH REFLEX TO CULTURE - Abnormal; Notable for the following components:    Ketones, Urine TRACE (*)     Specific Gravity, UA 1.034 (*)     Protein, UA 1+ (*)     All other components within normal limits   MICROSCOPIC URINALYSIS - Abnormal; Notable for the following components:    WBC, UA 0 TO 2 (*)     Casts UA 0 TO 2 (*)     All other components within normal limits   COMPREHENSIVE METABOLIC PANEL   LIPASE   PROTIME-INR   APTT       Vitals Reviewed:    Vitals:    06/17/23 0958 06/17/23 1322 06/17/23 1342   BP: 104/64     Pulse: 92 88 84   Resp: 16 18 14    Temp: 98.4 F (36.9 C)     TempSrc: Oral     SpO2: 97% 96% 97%   Weight: 85.3 kg (188 lb)     Height: 1.854 m (6\' 1" )       MEDICATIONS GIVEN TO PATIENT THIS ENCOUNTER:  Orders Placed This Encounter   Medications    fentaNYL (SUBLIMAZE) injection 25 mcg     Refill:  0    sodium chloride 0.9 % bolus 100 mL    DISCONTD: sodium chloride flush 0.9 % injection 10 mL    iopamidol (ISOVUE-370) 76 % injection 75 mL     DISCHARGE PRESCRIPTIONS:  Discharge Medication List as of 06/17/2023  1:16 PM        PHYSICIAN CONSULTS ORDERED THIS ENCOUNTER:  None  FINAL IMPRESSION      1. Lower abdominal pain          DISPOSITION/PLAN   DISPOSITION Decision To Discharge 06/17/2023 01:16:21 PM    DISPOSITION CONDITION Stable           OUTPATIENT FOLLOW UP THE PATIENT:  Kathaleen Grinder, MD  [address]  [address]  [place] [address]  [phone]    Schedule an appointment as soon as possible for a visit  Raja Caputi [address] [place] Danille Oppedisano, DO        Teriyah P[place]ton, [place], [place]  06/17/23 [phone]

## 2023-06-18 ENCOUNTER — Emergency Department: Admit: 2023-06-18

## 2023-06-18 ENCOUNTER — Inpatient Hospital Stay
Admission: EM | Admit: 2023-06-18 | Discharge: 2023-06-19 | Disposition: A | Discharge status: Home / Self Care | Admitting: Emergency Medicine

## 2023-06-18 DIAGNOSIS — R0789 Other chest pain: Secondary | ICD-10-CM

## 2023-06-18 DIAGNOSIS — R079 Chest pain, unspecified: Secondary | ICD-10-CM

## 2023-06-18 LAB — BMP (EXT)
Anion Gap (EXT): 15 mmol/L (ref 9–16)
BUN (EXT): 17 mg/dL (ref 6–20)
CO2 (EXT): 18 mmol/L — ABNORMAL LOW (ref 20–31)
CalciumCalcium (EXT): 8.4 mg/dL — ABNORMAL LOW (ref 8.6–10.4)
Chloride (EXT): 105 mmol/L (ref 98–107)
Creatinine (EXT): 0.7 mg/dL (ref 0.7–1.2)
Estimated Average Glucose mg/dL (INT/EXT): 97 mg/dL (ref 74–99)
Potassium (EXT): 4 mmol/L (ref 3.7–5.3)
Sodium (EXT): 138 mmol/L (ref 136–145)
eGFR - Creat MDRD (EXT): >90 mL/min/{1.73_m2} (ref >60)

## 2023-06-18 LAB — CBC WITH AUTO DIFFERENTIAL
Basophils %: 0 % (ref 0–2)
Basophils Absolute: <0.03 10*3/uL (ref 0.00–0.20)
Eosinophils %: 3 % (ref 1–4)
Eosinophils Absolute: 0.16 10*3/uL (ref 0.00–0.44)
Hematocrit: 31 % — ABNORMAL LOW (ref 40.7–50.3)
Hemoglobin: 10.3 g/dL — ABNORMAL LOW (ref 13.0–17.0)
Immature Granulocytes %: 0 %
Immature Granulocytes Absolute: <0.03 10*3/uL (ref 0.00–0.30)
Lymphocytes %: 24 % (ref 24–43)
Lymphocytes Absolute: 1.33 10*3/uL (ref 1.10–3.70)
MCH: 28.2 pg (ref 25.2–33.5)
MCHC: 33.2 g/dL (ref 28.4–34.8)
MCV: 84.9 fL (ref 82.6–102.9)
MPV: 9.5 fL (ref 8.1–13.5)
Monocytes %: 14 % — ABNORMAL HIGH (ref 3–12)
Monocytes Absolute: 0.76 10*3/uL (ref 0.10–1.20)
NRBC Automated: 0 /100{WBCs}
Neutrophils %: 60 % (ref 36–65)
Neutrophils Absolute: 3.37 10*3/uL (ref 1.50–8.10)
Platelets: 288 10*3/uL (ref 138–453)
RBC: 3.65 m/uL — ABNORMAL LOW (ref 4.21–5.77)
RDW: 16.1 % — ABNORMAL HIGH (ref 11.8–14.4)
WBC: 5.7 10*3/uL (ref 3.5–11.3)

## 2023-06-18 LAB — BASIC METABOLIC PANEL
Anion Gap: 15 mmol/L (ref 9–16)
BUN: 17 mg/dL (ref 6–20)
CO2: 18 mmol/L — ABNORMAL LOW (ref 20–31)
Calcium: 8.4 mg/dL — ABNORMAL LOW (ref 8.6–10.4)
Chloride: 105 mmol/L (ref 98–107)
Creatinine: 0.7 mg/dL (ref 0.7–1.2)
Est, Glom Filt Rate: >90 mL/min/{1.73_m2} (ref >60)
Glucose: 97 mg/dL (ref 74–99)
Potassium: 4 mmol/L (ref 3.7–5.3)
Sodium: 138 mmol/L (ref 136–145)

## 2023-06-18 LAB — TROPONIN
Troponin, High Sensitivity: <6 ng/L (ref 0–22)
Troponin, High Sensitivity: <6 ng/L (ref 0–22)

## 2023-06-18 MED ORDER — ONDANSETRON HCL 4 MG/2ML IJ SOLN
4 | Freq: Once | INTRAMUSCULAR | Status: AC
Start: 2023-06-18 — End: 2023-06-18
  Administered 2023-06-18: 21:00:00 4 mg via INTRAVENOUS

## 2023-06-18 MED ORDER — NITROGLYCERIN 0.4 MG SL SUBL
0.4 | SUBLINGUAL | Status: DC | PRN
Start: 2023-06-18 — End: 2023-06-18
  Administered 2023-06-18: 21:00:00 0.4 mg via SUBLINGUAL

## 2023-06-18 MED ORDER — ACETAMINOPHEN 500 MG PO TABS
500 | Freq: Once | ORAL | Status: AC
Start: 2023-06-18 — End: 2023-06-18
  Administered 2023-06-18: 21:00:00 1000 mg via ORAL

## 2023-06-18 MED ORDER — IOPAMIDOL 76 % IV SOLN
76 | Freq: Once | INTRAVENOUS | Status: AC | PRN
Start: 2023-06-18 — End: 2023-06-18
  Administered 2023-06-18: 22:00:00 150 mL via INTRAVENOUS

## 2023-06-18 MED ORDER — FENTANYL CITRATE (PF) 100 MCG/2ML IJ SOLN
100 | Freq: Once | INTRAMUSCULAR | Status: AC
Start: 2023-06-18 — End: 2023-06-18
  Administered 2023-06-18: 22:00:00 25 ug via INTRAVENOUS

## 2023-06-18 MED FILL — ONDANSETRON HCL 4 MG/2ML IJ SOLN: 4 MG/2ML | INTRAMUSCULAR | Qty: 2 | Fill #0

## 2023-06-18 MED FILL — NITROGLYCERIN 0.4 MG SL SUBL: 0.4 MG | SUBLINGUAL | Qty: 25 | Fill #0

## 2023-06-18 MED FILL — ACETAMINOPHEN EXTRA STRENGTH 500 MG PO TABS: 500 MG | ORAL | Qty: 2 | Fill #0

## 2023-06-18 MED FILL — FENTANYL CITRATE (PF) 100 MCG/2ML IJ SOLN: 100 MCG/2ML | INTRAMUSCULAR | Qty: 2 | Fill #0

## 2023-06-18 NOTE — ED Notes (Addendum)
 Pt provided box lunched and blankets. Pt educated that he will be NPO at midnight.

## 2023-06-18 NOTE — ED Provider Notes (Signed)
 [facility]     Emergency Department     Faculty Attestation    I performed a history and physical examination of the patient and discussed management with the resident. I have reviewed and agree with the resident's findings including all diagnostic interpretations, and treatment plans as written at the time of my review. Any areas of disagreement are noted on the chart. I was personally present for the key portions of any procedures. I have documented in the chart those procedures where I was not present during the key portions. For Physician Assistant/ Nurse Practitioner cases/documentation I have personally evaluated this patient and have completed at least one if not all key elements of the E/M (history, physical exam, and MDM). Additional findings are as noted.    PtName: Miguel Carr  MRN: [id number]  Birthdate 1970-07-06  Date of evaluation: 06/18/23  Note Started: 5:14 PM EDT    Primary Care Physician: No primary care provider on file.    Brief HPI: Syncope accompanied with chest pain and hx of MI and stroke. Arrives HD stable.       Pertinent Physical Exam Findings:  Vitals:    06/18/23 1652   BP:    Pulse: 83   Resp: 20   Temp:    SpO2: 93%        Physical exam findings:  No acute findings but a slight wheeze on the left upper lobe anterior chest 2/2 smoking    Medical Decision Making: Patient is a 53 y.o. male presenting to the emergency department with syncope and chest pain. The chart was reviewed for pertinent history relating to the chief complaint.  The patient appears stable.     ED Course: Needs cardiac work up, CT head, CTA for LVO and CTA chest for PE r/o. He is HD stable but his story is vey concerning for a cardiac syncopal episode. His EKG shows NSR, normal axis, QTc 442, no ischemic pattern, no STEMI noted.    Would recommend at minimum a cardiac observation and if something egregious is found at this work up, in patient admission for chest pain  and syncope. No obvious JVD and no signs of tamponade on exam.     All results, including labs (if ordered), imaging (if ordered), and EKGs (if ordered) were independently interpreted by me.  See radiologist report for additional details on imaging studies.      (Please note that portions of this note were completed with a voice recognition program.  Efforts were made to edit the dictations but occasionally words are mis-transcribed.)    Alric Seton. Zolton Dowson MD, [facility]  Attending Emergency Medicine Physician        Miguel Becket, MD  06/18/23 1946
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
## 2023-06-18 NOTE — ED Provider Notes (Signed)
 [address] [facility] EMERGENCY DEPARTMENT  Emergency Department  Faculty Sign-Out Addendum     Care of Miguel Carr was assumed from previous attending and is being seen for Shortness of Breath, Chest Pain, and Loss of Consciousness  .  The patient's initial evaluation and plan have been discussed with the prior provider who initially evaluated the patient.    Handoff taken on the following patient from prior Attending Physician:    Flonnie Hailstone    I was available and discussed any additional care issues that arose and coordinated the management plans with the resident(s) caring for the patient during my duty period. Any areas of disagreement with resident's documentation of care or procedures are noted on the chart. I was personally present for the key portions of any/all procedures during my duty period. I have documented in the chart those procedures where I was not present during the key portions.      EMERGENCY DEPARTMENT COURSE / MEDICAL DECISION MAKING:       MEDICATIONS GIVEN:  Orders Placed This Encounter   Medications    nitroGLYCERIN (NITROSTAT) SL tablet 0.4 mg    acetaminophen (TYLENOL) tablet 1,000 mg    ondansetron (ZOFRAN) injection 4 mg    fentaNYL (SUBLIMAZE) injection 25 mcg     Refill:  0    iopamidol (ISOVUE-370) 76 % injection 150 mL       LABS / RADIOLOGY:     Labs Reviewed   BASIC METABOLIC PANEL - Abnormal; Notable for the following components:       Result Value    CO2 18 (*)     Calcium 8.4 (*)     All other components within normal limits   CBC WITH AUTO DIFFERENTIAL - Abnormal; Notable for the following components:    RBC 3.65 (*)     Hemoglobin 10.3 (*)     Hematocrit 31.0 (*)     RDW 16.1 (*)     Monocytes % 14 (*)     All other components within normal limits   TROPONIN   TROPONIN   PREVIOUS SPECIMEN   SPECIMEN REJECTION       CT CHEST PULMONARY EMBOLISM W CONTRAST  Result Date: 06/18/2023  EXAMINATION: CTA OF THE CHEST 06/18/2023 6:17 pm TECHNIQUE: CTA of the chest was performed  after the administration of intravenous contrast.  Multiplanar reformatted images are provided for review.  MIP images are provided for review. Automated exposure control, iterative reconstruction, and/or weight based adjustment of the mA/kV was utilized to reduce the radiation dose to as low as reasonably achievable. COMPARISON: Chest x-ray 06/18/2023 HISTORY: ORDERING SYSTEM PROVIDED HISTORY: eval for PE TECHNOLOGIST PROVIDED HISTORY: eval for PE Additional Contrast?->1 Reason for Exam: eval for PE FINDINGS: Pulmonary Arteries: Pulmonary arteries are adequately opacified for evaluation.  The study was motion degraded.  No gross evidence of an acute pulmonary embolus.  Main pulmonary artery is normal in caliber. Mediastinum: No evidence of mediastinal lymphadenopathy.  The heart and pericardium demonstrate no acute abnormality.  There is no acute abnormality of the thoracic aorta.  There is mild concentric mural thickening in the esophagus.  There is a small hiatal hernia. Lungs/pleura: There is mild patchy ground-glass airspace disease in the bilateral upper lobes.  Lungs are otherwise clear.  No pneumothorax or pleural effusion. Upper Abdomen: There is mild nonobstructive gaseous distention of bowel in the upper abdomen. Soft Tissues/Bones: Mild concavity of the superior endplate of T4 with mild anterior wedge configuration.  This compression  fracture is favored to be remote.     1. Motion degraded study with no gross evidence of an acute pulmonary embolus. 2. Mild patchy ground-glass airspace disease in the bilateral upper lobes may be infectious or inflammatory.  An atypical or viral pneumonia is a consideration. 3. Mild concentric mural thickening in the esophagus with small hiatal hernia. Correlate clinically for esophagitis. 4. Mild T4 compression fracture favored to be remote. Correlate with point tenderness.     CT Head W/O Contrast  Result Date: 06/18/2023  EXAMINATION: CT OF THE HEAD WITHOUT CONTRAST   06/18/2023 6:17 pm TECHNIQUE: CT of the head was performed without the administration of intravenous contrast. Automated exposure control, iterative reconstruction, and/or weight based adjustment of the mA/kV was utilized to reduce the radiation dose to as low as reasonably achievable. COMPARISON: None. HISTORY: ORDERING SYSTEM PROVIDED HISTORY: history of CVA, l sided tingling, not on [facility] TECHNOLOGIST PROVIDED HISTORY: history of CVA, l sided tingling, not on [facility] Decision Support Exception - unselect if not a suspected or confirmed emergency medical condition->Emergency Medical Condition (MA) Reason for Exam: history of CVA, l sided tingling, not on AC FINDINGS: BRAIN/VENTRICLES: There is no acute intracranial hemorrhage, mass effect or midline shift.  No abnormal extra-axial fluid collection.  The gray-white differentiation is maintained without evidence of an acute infarct.  The left lateral ventricle is larger than the right.  3rd and 4th ventricles are normal in size and in the midline. ORBITS: The visualized portion of the orbits demonstrate no acute abnormality. SINUSES: There is mild disease in the anterior aspect of the left maxillary sinus.  There is mucosal thickening in the anterior left ethmoid sinus.  Mild mucosal thickening in the left frontal recess.  Paranasal sinuses are otherwise clear.  No evidence of an air-fluid level.  Bilateral mastoids and middle ears are aerated.  Wax in the bilateral external auditory canals. SOFT TISSUES/SKULL:  No acute abnormality of the visualized skull or soft tissues.     No acute intracranial abnormality. Mild left-sided paranasal sinus disease as above.     XR CHEST (2 VW)  Result Date: 06/18/2023  EXAMINATION: TWO XRAY VIEWS OF THE CHEST 06/18/2023 5:18 pm COMPARISON: 06/16/2023 HISTORY: ORDERING SYSTEM PROVIDED HISTORY: Chest Pain TECHNOLOGIST PROVIDED HISTORY: Chest Pain FINDINGS: The heart is top normal.  The pulmonary vessels are normal.  The lungs are mildly  hyperinflated and there mild linear densities scattered along the lung bases which is more prominent.  No consolidation or effusion is seen.  The bones are intact.     Mild hyperinflation and mild discoid atelectasis or scarring along the lung bases which is more prominent with no infiltrate or effusion.     CT ABDOMEN PELVIS W IV CONTRAST Additional Contrast? None  Result Date: 06/17/2023  EXAMINATION: CT OF THE ABDOMEN AND PELVIS WITH CONTRAST 06/17/2023 12:29 pm TECHNIQUE: CT of the abdomen and pelvis was performed with the administration of intravenous contrast. Multiplanar reformatted images are provided for review. Automated exposure control, iterative reconstruction, and/or weight based adjustment of the mA/kV was utilized to reduce the radiation dose to as low as reasonably achievable. COMPARISON: 16 June 2023 HISTORY: ORDERING SYSTEM PROVIDED HISTORY: RLQ pain TECHNOLOGIST PROVIDED HISTORY: RLQ pain Decision Support Exception - unselect if not a suspected or confirmed emergency medical condition->Emergency Medical Condition (MA) Reason for Exam: RLQ pain nausea Additional signs and symptoms: CTA done yesterday. FINDINGS: Lower Chest: No acute abnormality noted in the lung bases. Organs: Liver, spleen, gallbladder, pancreas, adrenals  and kidneys demonstrate no acute abnormality. GI/Bowel: Stomach is markedly distended and has a fluid air level.  There is some thickening in the distal duodenal bulb, 2nd portion the duodenum that may be related to ulcer disease.  Stomach is somewhat diet more dilated than the rest of the GI tract.  No free fluid or free air is noted.  Fluid-filled small bowel loops are present.  No evidence of transition zone.  Moderate colonic stool load.  No evidence of acute appendicitis.  Colonic diverticulosis is present without acute diverticulitis. Pelvis: Bladder is unremarkable although suboptimally distended.  Bladder wall is slightly thickened.  Prostate top-normal in size.  No free  pelvic fluid, pelvic or inguinal adenopathy is noted. Peritoneum/Retroperitoneum: No aortic aneurysm.  Small hiatal hernia.  No retroperitoneal or mesenteric adenopathy. Bones/Soft Tissues: No osseous or subcutaneous soft tissue abnormality.     Stomach is dilated with an air-fluid level.  There is question be either spasm or some mucosal thickening in the duodenum with ulcer disease not excluded.  Increased small bowel fluid similar to prior study.  Otherwise unremarkable exam without evidence of other acute abdominopelvic process.     US SCROTUM W LIMITED DUPLEX  Result Date: 06/17/2023  EXAMINATION: ULTRASOUND OF THE SCROTUM/TESTICLES WITH COLOR DOPPLER FLOW EVALUATION 06/17/2023 TECHNIQUE: Duplex ultrasound using B-mode/gray scaled imaging, Doppler spectral analysis and color flow Doppler was obtained of the testicles. COMPARISON: None. HISTORY: ORDERING SYSTEM PROVIDED HISTORY: Right pelvic, rlq pain. r/o torsion TECHNOLOGIST PROVIDED HISTORY: Right pelvic, rlq pain. r/o torsion FINDINGS: Measurements: Right Testicle: 4.7 x 2.5 x 2.9 cm Left Testicle: 4.5 x 2.5 x 2.8 cm Right: Grey Scale: The right testicle demonstrates normal homogeneous echotexture without focal lesion.  No evidence of testicular microlithiasis. Doppler Evaluation: There is normal arterial and venous Doppler flow within the testicle. Scrotal Sac: No evidence of hydrocele. Epididymis: No acute abnormality. Left: Grey Scale: The left testicle demonstrates normal homogeneous echotexture without focal lesion.  No evidence of testicular microlithiasis. Doppler Evaluation: There is normal arterial and venous Doppler flow within the testicle. Scrotal Sac: No evidence of hydrocele. Epididymis: No acute abnormality.     Unremarkable testicular ultrasound with normal Doppler flow.     CTA ABDOMEN PELVIS W CONTRAST  Result Date: 06/16/2023  EXAMINATION: CTA OF THE ABDOMEN AND PELVIS WITH CONTRAST 06/16/2023 6:11 pm TECHNIQUE: CTA of the abdomen and pelvis was  performed with the administration of intravenous contrast. Multiplanar reformatted images are provided for review. MIP images are provided for review. Automated exposure control, iterative reconstruction, and/or weight based adjustment of the mA/kV was utilized to reduce the radiation dose to as low as reasonably achievable. COMPARISON: None. HISTORY: ORDERING SYSTEM PROVIDED HISTORY: severe AP to back, near syncope, eval AAA TECHNOLOGIST PROVIDED HISTORY: severe AP to back, near syncope, eval AAA Reason for Exam: severe AP to back, near syncope, eval AAA FINDINGS: Nonvascular Lower Chest: No acute infiltrate at the lung bases. Organs: The liver, spleen, pancreas and adrenal glands are unremarkable.  No acute biliary findings.  Both kidneys enhance normally with no mass or significant hydronephrosis. GI/Bowel: The stomach and duodenal sweep are unremarkable.  No small bowel distension.  No evidence of appendicitis.  Liquid stool is noted in the proximal colon.  No pericolonic inflammatory changes. Pelvis: No pelvic mass or free pelvic fluid.  The prostate is unremarkable. Mild distention of the urinary bladder. Peritoneum/Retroperitoneum: There is no retroperitoneal adenopathy or upper abdominal ascites.  No free intraperitoneal air. Bones/Soft Tissues: No acute osseous  or soft tissue abnormality. VASCULAR The abdominal aorta is normal in caliber with scattered calcified plaque.  No evidence of dissection.  The iliac and proximal femoral vessels bilaterally are unremarkable.  No significant atherosclerotic plaque, dissection or aneurysm.  No stenosis of the celiac artery or SMA.  The IMA is patent. Single renal artery supplying each kidney with no proximal stenosis.     1. No acute aortic pathology.  Minimal calcified plaque with no aneurysm or dissection. 2. Otherwise unremarkable CT of the abdomen and pelvis.  No acute bowel pathology.     XR CHEST PORTABLE  Result Date: 06/16/2023  EXAMINATION: ONE XRAY VIEW OF THE  CHEST 06/16/2023 5:21 pm COMPARISON: None available. HISTORY: ORDERING SYSTEM PROVIDED HISTORY: Abdominal pain TECHNOLOGIST PROVIDED HISTORY: Abdominal pain FINDINGS: The lungs are clear.  The cardiac silhouette is within normal limits.  There is no pneumothorax or pleural effusion.     1.  No acute abnormality.     XR CHEST 1 VIEW  Result Date: 06/15/2023  Single view chest History: Difficulty breathing, shortness of breath Comparison: 06/09/2043 Impression: Central pulmonary vascular congestion, trace interstitial pulmonary edema. No pneumothorax or pleural effusion. Nonenlarged heart. Workstation:[id number] Finalized by Jerelyn Charles, MD on 06/15/2023 7:16 PM    XR CHEST (2 VW)  Result Date: 06/14/2023  Patient MRN: [id number] Patient Location: [facility] Requesting Physician:  Genene Churn Date/Time           Exam Description                   PACS Acc # 06/14/2023 8:47 PM   CHEST 2 VIEWS                      [id number] EXAMINATION: CHEST 2 VIEWS TECHNIQUE: Two views of the chest were obtained. COMPARISON/CORRELATION: June 10, 2023 CLINICAL HISTORY: chest pain  The patient or patient's representative reports that Chest Pain is their top priority for today's visit.   Patient into ED via EMS for c/o chest pain that radiates to left jaw and left arm. Patient reports crushing sensation. Patient states he was seen at [facility] earlier today and was told he needed an ECHO, patient reports he left AMA. HX of Atrial Fibrillation. Aspirin 324 by mouth give FINDINGS: No lobar consolidation or pulmonary edema.   Cardiomediastinal silhouette is stable. Osseous structures are intact. IMPRESSION: No acute cardiopulmonary process. Report reviewed and signed: Bebe Liter Date signed: 06/14/2023 9:41 PM    CT ABDOMEN PELVIS WO CONTRAST  Result Date: 06/13/2023  COMPARISON:  None    CLINICAL HISTORY:  Right lower abdominal pain FINDINGS:  A CT of the abdomen and pelvis was performed without the prior administration of intravenous or oral contrast.  Axial sections were obtained as well as reformatted sagittal and coronal images. Please note the lack of intravenous contrast limits the evaluation of solid visceral and vascular structures. Limited scanning through the lower lung fields reveals a small hiatal hernia. The liver, gallbladder, spleen, pancreas, adrenal glands, and kidneys appear normal. There was no evidence of hydronephrosis. No renal or ureteral calculi were seen. There is no evidence of intraabdominal or retrocrural lymphadenopathy. There is a normal appearance of the appendix. The pelvic genitourinary organs demonstrate a normal CT appearance. There is a moderate to large amount stool in the rectum which is dilated measuring 7.2 cm. There is moderate stool in the colon without evidence of additional large or small bowel dilatation. There was no evidence of  free air. There is no evidence of pelvic lymphadenopathy or free fluid.    1. Normal appearance of the appendix. 2. Fecal impaction with stool throughout the colon which may represent constipation. 3. Small hiatal hernia. 4. No hydronephrosis. 5. No renal or ureteral calculi.    XR CHEST (2 VW)  Result Date: 06/10/2023  ORDER DATE: 06/10/2023 15:31 PROCEDURE: CHEST 2 VIEWS (ROUTINE) REASON FOR EXAM: Chest pain ADDITIONAL HISTORY: Pt states: chest pain x , Hx of heart attacks x 3, strokes x 3, smoker x 72yrs 1/2ppd. - CHEST - PA AND LATERAL VIEWS TECHNIQUE: Two views of the chest were acquired. COMPARISON: No previous relevant studies available for correlation. FINDINGS: Surgical changes and devices: None. Mediastinum: Cardiomediastinal silhouette is normal.  The heart size is normal.  The thoracic aorta is atherosclerotic.  The central pulmonary vascularity is normal. Lungs and pleura: No pleural effusions or pneumothorax.  Lungs are clear and hyperinflated.. Bones and chest wall: No suspicious bony abnormalities.  Soft tissues appear unremarkable.    No acute cardiopulmonary process.  Radiologist/Resident/RPA/USP: Randall [place], RPA I have personally reviewed the images, finalized the report and E-signed: Rogene Houston, M.D. at 06/10/2023 3:40 PM WORKSTATION: [facility] MDOC:    XR CHEST (2 VW)  Result Date: 06/09/2023  PA and lateral chest: HISTORY: Chest pain. 2 views of the chest are obtained. Cardiac and mediastinal contours are within normal limits. Lungs are clear. There is no vascular congestion, effusion, or pneumothorax. Mild mid thoracic compression deformity is age indeterminate. IMPRESSION: No acute findings. Workstation:[id number] Finalized by Jasper Riling, MD on 06/09/2023 8:17 PM    XR CHEST PORTABLE  Result Date: 06/08/2023  [facility] Diagnostic Imaging Report EXAM INFORMATION: Examination: XRAY CHEST PORTABLE 1 VIEW Date of Exam: 06/08/2023 6:14 pm Diagnosis/Reason for Exam: chest pain Additional History: per pt, chest pain on and off, chest pressure Number of Images: 1 Comparison: None. DISCUSSION: Lungs / pleura: Diffuse minimal interstitial opacities are nonspecific but favored secondary to chronic lung disease.  Minimal pulmonary edema and atypical infection can also have this appearance.  No pleural effusion or pneumothorax. Heart / mediastinum: Normal. Bones: Unremarkable. Marked distention of the stomach with air is nonspecific.  Abdominal free air is considered much less likely.  If there is clinical concern for free intraperitoneal air, recommend upright abdominal radiograph.    As above. Interpretation by Levonne Hubert, M.D. Professional Interpretation by [facility] Reading Workstation: [id number]    CT Head wo Contrast  Result Date: 06/07/2023  EXAMINATION:CT HEAD WO CONTRAST CLINICAL INDICATION: 52 years Male dizziness COMPARISON: No prior similar studies were available for comparison. TECHNIQUE: CT axial images were obtained through the head without IV contrast.  Reformatted coronal and sagittal images were also obtained. This examination was performed according to our  departmental dose optimization program, which includes automated exposure control, adjustment of the MA and/or KV according to patient size and/or use of iterative reconstruction technique. Total DLP: 878.92 mGy-cm. FINDINGS: Brain: No acute intracranial hemorrhage or subdural fluid collection.  No mass effect or midline shift.  The ventricles are normal in size and configuration.  The gray-white matter junction appears normal. Minimal low-attenuation is identified within the bilateral periventricular white matter from microvascular changes. The posterior fossa structures appear grossly unremarkable. Skull: The skull appears intact.  No fracture.  No bony erosion or destruction or periosteal reaction. Orbits and globes: The limited evaluation of the bilateral orbits and bilateral globes and retrobulbar spaces show no gross abnormality. Paranasal sinuses: The limited evaluation  of the paranasal sinuses show no gross opacification.   No gross opacification of the nasal cavity.    No acute intracranial process on this noncontrast CT scan of the head. -------- FINAL REPORT -------- Dictated By: Channing Mutters Dictated Date: 06/07/2023 14:02 Assigned Physician: Channing Mutters Reviewed and Electronically Signed By: Channing Mutters Signed Date: 06/07/2023 14:04 Workstation ID: [id number] Transcribed By: Self Edit Transcribed Date: 06/07/2023 14:02    XR CHEST 1 VIEW  Result Date: 06/07/2023  EXAM DESCRIPTION:  XR CHEST 1 VIEW CLINICAL HISTORY:  52 years Male,pneumonia? Cough.  Chest congestion. COMPARISON:  No prior chest radiograph were available for comparison. TECHNIQUE: AP upright portable chest radiograph.   FINDINGS: Lungs:  Lungs and pleural spaces are free of acute process. The bilateral lung show no acute consolidations or effusions or masses or pneumothoraces. Cardiac and pulmonary vasculature:  Cardiomediastinal silhouette and pulmonary vasculature are within normal limits. Osseous structures: Osseous  structures appear intact. Devices: EKG leads and wires are identified on the patient.    No radiographic evidence for acute cardiopulmonary disease. -------- FINAL REPORT -------- Dictated By: Channing Mutters Dictated Date: 06/07/2023 13:45 Assigned Physician: Channing Mutters Reviewed and Electronically Signed By: Channing Mutters Signed Date: 06/07/2023 13:46 Workstation ID: [id number] Transcribed By: Self Edit Transcribed Date: 06/07/2023 13:45    CHEST, PORTABLE X-RAY  Result Date: 06/04/2023  EXAM: Chest x-ray, 06/04/2023 6:00 PM INDICATION: 53 years Male with chest pain COMPARISON: Chest x-ray, 04/24/2010 DISCUSSION: See impression    IMPRESSION: Negative for acute cardiopulmonary abnormality.    CT Abdomen Pelvis with Contrast IV only  Result Date: 06/02/2023  PROCEDURE:  CT ABDOMEN PELVIS W CONTRAST HISTORY:  Right lower quadrant pain , right flank pain and tenderness on palpation, nausea, hypertension, normal white blood cell count TECHNIQUE:  Helical CT of the abdomen and pelvis was performed using non-ionic intravenous contrast.  Oral contrast was not given. Automated exposure control and iterative dose reconstruction were utilized to minimize radiation dose. COMPARISON:  None FINDINGS CT ABDOMEN/PELVIS: Lower thorax:  Bilateral lower lobe has mild peribronchial thickening which could reflect a bronchitis. No pulmonary airspace consolidation or pleural effusion. Liver: unremarkable Biliary tree: Unremarkable gallbladder. No biliary ductal dilatation. Spleen: Unremarkable Pancreas:  Unremarkable. Adrenal glands:  Unremarkable. Kidneys:  Symmetric nephrograms. No nephrolithiasis or hydronephrosis. No perinephric inflammation. Lymph nodes: Abdomen: No abdominal adenopathy. Pelvis: No pelvic adenopathy. Vasculature:  There is no abdominal aortic aneurysm. Atherosclerotic calcification is seen. Peritoneum/mesentery/omentum:  No free fluid or free air. GI tract:  No bowel obstruction. Appendix is normal. Stool is  scattered throughout the large bowel and distending the rectum at 7 cm in diameter. The stomach has thickened folds and/or wall thickening which may be due to suboptimal distention as opposed to intrinsic pathology such as a gastritis. Correlate clinically. The duodenum and a few proximal small bowel loops have thickened folds. There is intraluminal fluid associated with multiple small bowel loops. There is wall thickening associated with the distal and terminal ileum with associated luminal narrowing as well as involving the sigmoid colon. Pelvic urogenital structures: Bladder is unremarkable.  The prostate is present. Body wall:  The osseous structures are unremarkable. Key: (S/I) = series number / image number IMPRESSION:   Correlate clinically regarding possibility of a gastroenteritis. Nonspecific wall thickening involving the terminal-distal ileum as well as sigmoid colon. Advise further management by gastroenterology. Fecal loading of the large bowel may reflect a component of constipation. Mild bilateral lower lobe bronchitis. FINAL REPORT Attending Radiologist:  Arline Asp  MD Date Signed Off:  06/02/2023 11:16    X-Ray Chest 1 View  Result Date: 05/19/2023  EXAM: Chest, 1 View INDICATION:  Chest pain TECHNIQUE: Single View Chest COMPARISON:  05/17/2023 FINDINGS: The heart size is normal. The great vessels appear unremarkable. There is no hilar or mediastinal mass. The lungs are clear. There is no pleural effusion or pneumothorax. There are no significant osseous abnormalities. IMPRESSION:   No active cardiopulmonary disease. Signed in [employer] by [facility] OSSIANI at 05/19/2023 08:27:14 PM      RECENT VITALS:     Temp: 98.6 F (37 C),  Pulse: 72, Respirations: 20, BP: 110/64, SpO2: 95 %    This patient is a 53 y.o. Male with shortness of breath syncope.  Appears cardiogenic in origin.  Cardiac workup, CT PE.  Potential observation admission    OUTSTANDING TASKS / RECOMMENDATIONS:    Follow-up on cardiac  workup      Blain Pais, MD, Lacie Scotts  Attending Emergency Physician  [facility] EMERGENCY DEPARTMENT       Rennie Rouch, Milford Cage, MD  06/18/23 2005

## 2023-06-18 NOTE — ED Notes (Signed)
 Pt arrived via [place] with c/o chest pain   Pt stated he has a hx of stroke   Pt stated takes xarelto, but has not taken it recently in the past 4 days  PMS intact  RR even and nonlabored  A&Ox4  Call light in reach

## 2023-06-19 LAB — EKG 12-LEAD
Atrial Rate: 95 {beats}/min
Atrial Rate: 91 {beats}/min
P Axis: 51 degrees
P Axis: 50 degrees
P-R Interval: 112 ms
P-R Interval: 132 ms
Q-T Interval: 346 ms
Q-T Interval: 338 ms
QRS Duration: 98 ms
QRS Duration: 98 ms
QTc Calculation (Bazett): 434 ms
QTc Calculation (Bazett): 415 ms
R Axis: 75 degrees
R Axis: 71 degrees
T Axis: 47 degrees
T Axis: 56 degrees
Ventricular Rate: 95 {beats}/min
Ventricular Rate: 91 {beats}/min

## 2023-06-19 MED ORDER — MAGNESIUM SULFATE 2000 MG/50 ML IVPB PREMIX
2 GM/50ML | INTRAVENOUS | Status: DC | PRN
Start: 2023-06-19 — End: 2023-06-19

## 2023-06-19 MED ORDER — IPRATROPIUM-ALBUTEROL 0.5-2.5 (3) MG/3ML IN SOLN
0.5-2.5 | RESPIRATORY_TRACT | Status: DC | PRN
Start: 2023-06-19 — End: 2023-06-19

## 2023-06-19 MED ORDER — OXYCODONE HCL 5 MG PO TABS
5 | Freq: Once | ORAL | Status: AC
Start: 2023-06-19 — End: 2023-06-18
  Administered 2023-06-19: 03:00:00 5 mg via ORAL

## 2023-06-19 MED ORDER — ASPIRIN 81 MG PO CHEW
81 MG | Freq: Every day | ORAL | Status: DC
Start: 2023-06-19 — End: 2023-06-19

## 2023-06-19 MED ORDER — MELATONIN 3 MG PO TABS
3 MG | Freq: Every evening | ORAL | Status: DC
Start: 2023-06-19 — End: 2023-06-19

## 2023-06-19 MED ORDER — ACETAMINOPHEN 325 MG PO TABS
325 MG | Freq: Four times a day (QID) | ORAL | Status: DC | PRN
Start: 2023-06-19 — End: 2023-06-19

## 2023-06-19 MED ORDER — ONDANSETRON HCL 4 MG/2ML IJ SOLN
4 MG/2ML | Freq: Four times a day (QID) | INTRAMUSCULAR | Status: DC | PRN
Start: 2023-06-19 — End: 2023-06-19

## 2023-06-19 MED ORDER — POTASSIUM BICARB-CITRIC ACID 20 MEQ PO TBEF
20 MEQ | ORAL | Status: DC | PRN
Start: 2023-06-19 — End: 2023-06-19

## 2023-06-19 MED ORDER — ACETAMINOPHEN 650 MG RE SUPP
650 MG | Freq: Four times a day (QID) | RECTAL | Status: DC | PRN
Start: 2023-06-19 — End: 2023-06-19

## 2023-06-19 MED ORDER — ONDANSETRON 4 MG PO TBDP
4 MG | Freq: Three times a day (TID) | ORAL | Status: DC | PRN
Start: 2023-06-19 — End: 2023-06-19

## 2023-06-19 MED ORDER — SODIUM CHLORIDE 0.9 % IV SOLN
0.9 % | INTRAVENOUS | Status: DC | PRN
Start: 2023-06-19 — End: 2023-06-19

## 2023-06-19 MED ORDER — MORPHINE SULFATE 4 MG/ML IJ SOLN
4 | Freq: Once | INTRAMUSCULAR | Status: AC
Start: 2023-06-19 — End: 2023-06-19
  Administered 2023-06-19: 07:00:00 4 mg via INTRAVENOUS

## 2023-06-19 MED ORDER — POLYETHYLENE GLYCOL 3350 17 G PO PACK
17 g | Freq: Every day | ORAL | Status: DC | PRN
Start: 2023-06-19 — End: 2023-06-19

## 2023-06-19 MED ORDER — POTASSIUM CHLORIDE CRYS ER 20 MEQ PO TBCR
20 MEQ | ORAL | Status: DC | PRN
Start: 2023-06-19 — End: 2023-06-19

## 2023-06-19 MED ORDER — RIVAROXABAN 20 MG PO TABS
20 MG | Freq: Every day | ORAL | Status: DC
Start: 2023-06-19 — End: 2023-06-19

## 2023-06-19 MED ORDER — POTASSIUM CHLORIDE 10 MEQ/100ML IV SOLN
10 MEQ/0ML | INTRAVENOUS | Status: DC | PRN
Start: 2023-06-19 — End: 2023-06-19

## 2023-06-19 MED ORDER — NORMAL SALINE FLUSH 0.9 % IV SOLN
0.9 % | INTRAVENOUS | Status: DC | PRN
Start: 2023-06-19 — End: 2023-06-19

## 2023-06-19 MED ORDER — METOPROLOL SUCCINATE ER 25 MG PO TB24
25 MG | Freq: Every day | ORAL | Status: DC
Start: 2023-06-19 — End: 2023-06-19

## 2023-06-19 MED ORDER — IBUPROFEN 600 MG PO TABS
600 MG | Freq: Four times a day (QID) | ORAL | Status: DC | PRN
Start: 2023-06-19 — End: 2023-06-19

## 2023-06-19 MED ORDER — NORMAL SALINE FLUSH 0.9 % IV SOLN
0.9 % | Freq: Two times a day (BID) | INTRAVENOUS | Status: DC
Start: 2023-06-19 — End: 2023-06-19
  Administered 2023-06-19: 07:00:00 10 mL via INTRAVENOUS

## 2023-06-19 MED FILL — XARELTO 20 MG PO TABS: 20 MG | ORAL | Qty: 1 | Fill #0

## 2023-06-19 MED FILL — OXYCODONE HCL 5 MG PO TABS: 5 MG | ORAL | Qty: 1 | Fill #0

## 2023-06-19 MED FILL — MORPHINE SULFATE 4 MG/ML IJ SOLN: 4 mg/mL | INTRAMUSCULAR | Qty: 1 | Fill #0

## 2023-06-19 NOTE — Plan of Care (Signed)
 Problem: Discharge Planning  Goal: Discharge to home or other facility with appropriate resources  Outcome: Progressing     Problem: Pain  Goal: Verbalizes/displays adequate comfort level or baseline comfort level  Outcome: Progressing

## 2023-06-19 NOTE — H&P (Signed)
 [facility]  CDU / OBSERVATION ENCOUNTER  ATTENDING NOTE            Patient left prior to evaluation by observation send       Francena Hanly MD  Attending Emergency  Physician

## 2023-06-19 NOTE — ED Notes (Signed)
 Received report from [facility]. Pt A&O x 4, on continuous monitor, does not appear in acute distress, RR even and unlabored, resting comfortably on stretcher with eyes closed and call light in reach. Pt voices no other concerns at this time. Continuing with current POC.

## 2023-06-19 NOTE — Progress Notes (Signed)
 Pt just recently arrived to floor. Writer began doing admission questions with pt. Pt became extremely verbally aggressive and screamed at writer stating "there is no relevance to these questions." Pt then refused to be placed on telemetry and pt also refused the ordered EKG. Education was provided.
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
## 2023-06-20 LAB — EKG 12-LEAD
Atrial Rate: 90 {beats}/min
P Axis: 45 degrees
P-R Interval: 126 ms
Q-T Interval: 362 ms
QRS Duration: 94 ms
QTc Calculation (Bazett): 442 ms
R Axis: 54 degrees
T Axis: 51 degrees
Ventricular Rate: 90 {beats}/min

## 2023-06-23 LAB — LIPID PROFILE (EXT)
Chol/HDL Ratio (EXT): 2.87 (ref <5.10)
Cholesterol (EXT): 112 mg/dL (ref <200)
HDL Cholesterol (EXT): 39 mg/dL — ABNORMAL LOW (ref >39)
LDL Cholesterol (EXT): 57 mg/dL (ref <100)
LDL/HDL Ratio (EXT): 1.46 (ref <2.54)
NON HDL Cholesterol (EXT): 73 mg/dL (ref <130)
Triglycerides (EXT): 81 mg/dL (ref <150)
VLDL Cholesterol (EXT): 16 mg/dL (ref <30)

## 2023-06-23 LAB — HEMOGLOBIN A1C
Estimated Average Glucose mg/dL (INT/EXT): 120 mg/dL
HEMOGLOBIN A1C % (INT/EXT): 5.8 % — ABNORMAL HIGH (ref 4.3–5.6)

## 2023-06-26 LAB — CMP (EXT)
A/G Ratio (EXT): 1.7 (ref 1.1–2.8)
ALT/SGPT (EXT): 34 U/L (ref 7–52)
AST/SGOT (EXT): 27 U/L (ref 13–39)
Albumin (EXT): 3.9 g/dL (ref 3.5–5.0)
Alkaline Phosphatase (EXT): 81 U/L (ref 34–104)
Anion Gap (EXT): 6 (ref 3–11)
BUN (EXT): 13 mg/dL (ref 7–25)
BUN/CREAT Ratio (EXT): 24.1 — ABNORMAL HIGH (ref 10.0–20.1)
Bilirubin, Total (EXT): 0.4 mg/dL (ref 0.3–1.0)
CO2 (EXT): 23 mmol/L (ref 21–31)
CalciumCalcium (EXT): 8.7 mg/dL (ref 8.6–10.3)
Chloride (EXT): 107 mmol/L (ref 98–107)
Creatinine (EXT): 0.54 mg/dL — ABNORMAL LOW (ref 0.90–1.30)
Glucose (EXT): 131 mg/dL — ABNORMAL HIGH (ref 74–100)
Potassium (EXT): 4 mmol/L (ref 3.6–5.1)
Protein (EXT): 6.2 g/dL (ref 6.1–7.9)
Sodium (EXT): 136 mmol/L (ref 136–145)
eGFR - Creat MDRD (EXT): >90 mL/min/{1.73_m2} (ref >=90.0)

## 2023-06-27 NOTE — ED Provider Notes (Signed)
 Formatting of this note is different from the original.  Images from the original note were not included.      Patient Name:         Miguel Carr First  Date of Birth:           January 30, 1971  Medical Record #:   [id number]     HPI     Chief Complaint   Patient presents with    Chest Pain     -    53 year old male with known history of CAD comes to ER with left-sided chest discomfort.  Patient became symptomatic earlier today when [place]  Patient states that he feels like someone has thrown a spiked a medicine ball into his left chest.  The pain radiates into his left face, jaw, left upper extremity and left lower extremity.  Symptoms feel similar to when he has had heart attacks in the past.  Patient states that he is had 3 heart attacks in the past all of which occurred in [place] .  Patient just arrived to [place] a few weeks ago.  Patient does not have a doctor here in the area.  Patient states his pain is severe currently in the ED.  When he did have his heart attacks, he did seek medical attention but did not receive any stenting nor has he had bypass surgery.  Last MI was 2023.  Patient came to the ER via ambulance.  He did receive aspirin  by paramedics prior to arrival.     Patient History     Allergies   Allergen Reactions    Adhesive Itching, Other and Rash     Itching     Clear tape, blisters    Diphenhydramine Anaphylaxis, Hives, Myalgias, Nausea And Vomiting, Other and Unknown     Becomes stiff    Pt. states allergic to IV form only.    IV only    Reports IV benadryl locks his muscles up. ABLE to take oral    "I can take oral, not IV. My whole body locks up."   "my muscles lock up on me"    Only IV version    "Locks his body up"    "Whole body locks up"    IV benadryl only    "IV form only. Locks my body up and makes me twitch."    PT reports he "locks up" and can not move after receiving.     "I can take oral, not IV. My whole body locks up."    "IV form only. Locks my  body up and makes me twitch."   PATIENT STATES MUCLES AND JOINTS GET TIGHT & NOT MOVE    PATIENT STATES MUCLES AND JOINTS GET TIGHT & NOT MOVE    muscles lock up, per pt   Pt reports he can take oral benadryl but not IV   "IV form only. Locks my body up and makes me twitch."   "it locks my muscles up"   "it locks my muscles up"    Ketorolac Hives, Itching, Nausea And Vomiting, Other, Unknown, Rash and Swelling     Other Reaction(s): History Unknown, Skin Rashes/Hives    SWELLING AND HIVES    Reported angioedema/resp failure with last use    SWELLING AND HIVES     Rash, nausea    Reaction:Hives, ONSET:  3 yrs    & vomiting per patient 2014.09.26 SP    rash  SWELLING AND HIVES    SWELLING AND HIVES     Can tolerate aspirin    rash   n/v   Vomit    Tramadol Anaphylaxis, Hives, Itching, Nausea And Vomiting, Other, Rash, Swelling and Unknown     Rash, nausea    Reaction:Hives, ONSET:  torad    & vomiting per patient 2014.09.26 SP    rash    Can tolerate hydrocodone   rash   SWELLING AND HIVES    n/v   Morphine  ok     Prior to Admission medications    Not on File     Past Medical History:   Diagnosis Date    Anxiety     Atrial fibrillation     COPD (chronic obstructive pulmonary disease)     Coronary artery disease     Hyperlipidemia     Hypertension     Psychiatric disorder     Stroke      Past Surgical History:   Procedure Laterality Date    BACK SURGERY      KNEE SURGERY       No family history on file.    Social History     Tobacco Use    Smoking status: Every Day     Types: Cigarettes    Smokeless tobacco: Not on file   Substance Use Topics    Alcohol use: Not on file    Drug use: Not Currently     Types: Marijuana      Review of Systems     Review of Systems   Respiratory:  Negative for shortness of breath.    Cardiovascular:  Positive for chest pain.   Gastrointestinal:  Positive for nausea. Negative for vomiting.   Musculoskeletal:  Positive for arthralgias, myalgias and neck pain.   Neurological:  Negative for  dizziness, syncope, light-headedness and headaches.   All other systems reviewed and are negative.     Physical Exam     ED Triage Vitals   Temp Pulse Resp BP   06/27/23 1957 06/27/23 1957 06/27/23 1957 06/27/23 1957   37.2 C (98.9 F) (!) 112 19 110/77     SpO2 Temp src Heart Rate Source Patient Position   06/27/23 1957 06/27/23 1957 06/27/23 1957 06/27/23 2032   97 % Oral Monitor Sitting     BP Location FiO2 (%)     06/27/23 2032 --     Left arm        -    Physical Exam  Vitals and nursing note reviewed.   Constitutional:       General: He is not in acute distress.     Appearance: Normal appearance. He is well-developed. He is not ill-appearing, toxic-appearing or diaphoretic.      Comments: Well-developed 53 year old male resting in exam bed.  He appears to be in mild distress due to reported chest discomfort.  However he is not acutely ill septic or toxic.  No conversational dyspnea rest.   HENT:      Head: Normocephalic and atraumatic.      Ears:      Comments: External ear anatomy WNL     Nose: Nose normal.      Comments: External nose anatomy within normal limits.     Mouth/Throat:      Mouth: Mucous membranes are moist.      Pharynx: Oropharynx is clear.   Eyes:      Extraocular Movements: Extraocular movements intact.  Conjunctiva/sclera: Conjunctivae normal.      Pupils: Pupils are equal, round, and reactive to light.      Comments: No periorbital or orbital edema or inflammatory changes.  No discharge present    Neck:      Comments: Painless active range of motion in all 4 cardinal directions  Cardiovascular:      Rate and Rhythm: Normal rate and regular rhythm.      Heart sounds: Normal heart sounds. No murmur heard.     No friction rub. No gallop.   Pulmonary:      Effort: Pulmonary effort is normal.      Breath sounds: Normal breath sounds.      Comments: Good air movement diffusely on auscultation  Chest:      Chest wall: No mass, deformity, tenderness, crepitus or edema.   Abdominal:       General: Abdomen is flat. Bowel sounds are normal. There is no distension.      Palpations: Abdomen is soft. There is no hepatomegaly, splenomegaly or mass.      Tenderness: There is no abdominal tenderness. There is no guarding or rebound.      Hernia: No hernia is present.   Musculoskeletal:         General: No swelling, tenderness or deformity. Normal range of motion.      Cervical back: Normal range of motion. No rigidity.      Right lower leg: No tenderness. No edema.      Left lower leg: No tenderness. No edema.      Comments: Painless active ROM of upper and lower extremities diffusely. No signs of trauma.  No inflammatory changes present   Skin:     General: Skin is warm and dry.      Capillary Refill: Capillary refill takes less than 2 seconds.      Coloration: Skin is not pale.      Findings: No erythema or rash.      Comments: No signs of trauma   Neurological:      General: No focal deficit present.      Mental Status: He is alert and oriented to person, place, and time. Mental status is at baseline.      Comments: No gross deficits.  Moves all fours. Sensation WNL.  No facial palsy. Speaks clearly without dysarthria   Psychiatric:         Mood and Affect: Mood normal.         Behavior: Behavior normal.         Thought Content: Thought content normal.         Judgment: Judgment normal.     Labs Reviewed   CBC WITH AUTODIFF - Abnormal       Result Value    WBC (White Blood Cell) Count 9.4      RBC 4.48 (*)     Hemoglobin 13.4 (*)     Hematocrit 38.9 (*)     MCV 86.9      MCH 29.8      MCHC 34.3      RDW 16.3 (*)     Platelets 387      MPV 6.7 (*)     Neutrophils Relative 76 (*)     Lymphocytes Relative 13 (*)     Monocytes Relative 9      Eosinophils Relative 1      Basophils Relative 1      Neutrophils Absolute 7.1  Lymphocytes Absolute 1.2      Monocytes Absolute 0.8      Eosinophils Absolute 0.1      Basophils Absolute 0.1     BASIC METABOLIC PANEL - Abnormal    Glucose 125 (*)     BUN 18       Creatinine, Serum 0.90      Bun/Creatinine Ratio 20.0      Sodium 138      Potassium 4.3      Chloride 105      CO2 24      Anion Gap 9      Calcium  9.0      GFR >90.0      Narrative:     Specimen is slightly lipemic.   INITIAL TROPONIN - Normal    High Sensitivity Troponin  4     PROTIME-INR (PT/INR) - Normal    Prothrombin Time 12.5      INR 1.1      Narrative:     THERAPEUTIC RANGES FOR ORAL ANTICOAGULANT THERAPY:  INR = 2.0 - 3.0  Prophylaxis or treatment of venous thrombosis,                    pulmonary embolism, or tissue valves.                    Prevention of systemic embolism, acute M.I.,                     atrial fibrillation and recurrent systemic                    embolism.  INR = 2.0 - 3.5  Mechanical valves, aortic position  INR = 2.5 - 3.5  Mechanical valves, mitral position   APTT - Normal    APTT, Plasma 32.8     NINETY MIN TROP - Normal    High Sensitivity Troponin  5     LIPID PANEL   HEMOGLOBIN A1C (HA1C)   HIGH SENSITIVTY TROPONIN     X-ray chest 1 view [id number]   Final Result     No infiltrate or vascular congestion.     Reading Location:  [facility]     Dictated By: Valarie Garner     Dictated Date/Time: 06/27/2023 8:32 PM     Electronically Signed By: Valarie Garner     Signed Date/Time:  06/27/2023 8:32 PM         Stress test with myocardial perfusion    (Results Pending)         Procedures    BP: 108/77 (0339)  Pulse: 80 (0339)  Resp: 18 (0339)  Temp: 35.9 C (96.7 F) (0339)  Are rigors present?: No (2040)  Are there signs/symptoms of infection?: No (2040)  Is the patient's mental status altered?: No (2040)        Scoring Tools (Do not delete or document in below blank space)    -  -  -  HEART Score       Date/Time History ECG Age Risk Factors Troponin HEART Score    06/27/23 2340 Highly suspicious  Normal  65+  >2 risk factors or hx of atherosclerotic disease  Less than or equal to normal limit  6          -     ED Course:  ED Course as of 06/28/23 0552   Sat Jun 27, 2023   2340  Patient reports having  known CAD with 3 prior heart attacks.  However EKG and serial troponin within normal limits.  Patient is new to the area and has no physicians locally.  Will admit to medical team to rule out ACS.  Case d/w Dr. Rosalee Collins, who will admit to [facility] service.  [BP]     ED Course User Index  [BP] Criss Dom, DO       Clinical Impressions as of 06/28/23 0552   Chest pain, unspecified type         Electronically signed by:    Criss Dom, DO  06/28/23 [phone]    Electronically signed by Criss Dom, DO at 06/28/2023  5:52 AM EDT

## 2023-06-27 NOTE — ED Triage Notes (Signed)
 Formatting of this note might be different from the original.  [place] of chest pain x 1 hour. Pt describes it to radiate to his left arm and jaw. Mild shortness of breath. Hx 3 previous Mis and Afib. On blood thinners. Given aspirin  en route.  Electronically signed by Liisa Reeves, RN at 06/27/2023  7:59 PM EDT

## 2023-06-28 LAB — BMP (EXT)
Anion Gap (EXT): 9 (ref 3–11)
BUN (EXT): 18 mg/dL (ref 7–25)
BUN/CREAT Ratio (EXT): 20 (ref 10.0–20.1)
CO2 (EXT): 24 mmol/L (ref 21–31)
CalciumCalcium (EXT): 9 mg/dL (ref 8.6–10.3)
Chloride (EXT): 105 mmol/L (ref 98–107)
Creatinine (EXT): 0.9 mg/dL (ref 0.90–1.30)
Glucose (EXT): 125 mg/dL — ABNORMAL HIGH (ref 74–100)
Potassium (EXT): 4.3 mmol/L (ref 3.6–5.1)
Sodium (EXT): 138 mmol/L (ref 136–145)
eGFR - Creat MDRD (EXT): >90 mL/min/{1.73_m2} (ref >=90.0)

## 2023-06-28 NOTE — Nursing Note (Signed)
 Formatting of this note might be different from the original.  Patient alert and oriented x4.  Patient asked to speak to nurse.  Patient stated he has a personal matter and needs to leave the hospital now and no longer wants to stay at facility.  Nurse ensured patient was aware of the risks of leaving against medical advice and had patient sign the [facility] paperwork. MD notified. Nurse removed IV from patient's forearm.  Electronically signed by Jonathan Neighbor, RN at 06/28/2023  6:28 AM EDT

## 2023-06-28 NOTE — Discharge Summary (Signed)
 Formatting of this note is different from the original.  Images from the original note were not included.    Discharge Summary    Patient Name:         Miguel Carr  Date of Birth:           1970-08-20  Medical Record #:   [id number]     Discharge Diagnosis  Chest pain, unspecified type    History Of Present Illness  Miguel Carr is a 53 y.o. male with a past medical history of coronary artery disease and reported 3 heart attacks, paroxysmal atrial fibrillation on Xarelto , hypertension, hyperlipidemia, CVA with mild residual left-sided weakness, bowel obstruction, asthma, COPD, a longtime smoker, bipolar disorder, schizoaffective disorder, personality disorder and anxiety. Patient presented with left-sided chest pain radiating to the left face jaw and left arm, reports pressure feeling, states feels similar to his previous heart attacks. He reports mild shortness of breath. He recently moved from [place]  and does not have a PCP yet. States he had 3 heart attacks in the past, did not receive any stenting or bypass surgery. States his last MI was in 2023. He was given aspirin  by paramedics. His chest pain improved. EKG without ischemic changes. Troponin negative. Chest x-ray clear.     Hospital Course  Patient admitted for chest pain workup. I was told earlier by the nurse that he wants to leave AMA. I went to the floor to talk to him but he refused to speak to me. He has a history of leaving AMA in the past.     Surgeries performed:   None  Other procedures:   None  Problems Addressed  Problem List         Nervous    * (Principal) Chest pain, unspecified type - Primary     Discharge Medications  Continued    atorvastatin  (Lipitor ) 10 mg tablet - 10 mg Daily respiratory    dilTIAZem CD 120 mg 24 hr capsule - 120 mg    divalproex (Depakote) 500 mg EC tablet - 500 mg 2 times daily    folic acid (Folvite) 1 mg tablet - 1 mg    hydrOXYzine pamoate (Vistaril) 50 mg capsule - 50 mg    lisinopril  (Prinivil ,  Zestril ) 30 mg tablet - 40 mg Daily respiratory    methIMAzole (Tapazole) 5 mg tablet - 5 mg Daily respiratory    metoprolol  succinate (Toprol -XL) 100 mg 24 hr tablet - 100 mg Daily respiratory    nitroglycerin  (NitrolinguaL ) 400 mcg/spray spray - 1 spray    PHENobarbital (Luminal) 60 mg tablet - 120 mg    rivaroxaban  (Xarelto ) 20 mg tablet - 20 mg    traZODone (Desyrel) 100 mg tablet - 100 mg    Discharge Instructions  No discharge procedures on file.    Test Results Pending At Discharge    Pertinent Physical Exam At Time of Discharge  Physical Exam    Issues Requiring Follow-Up    Outpatient Follow-Up  No future appointments.  Electronically signed by Anda Katayama, MD at 06/28/2023  6:32 AM EDT
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
## 2023-06-28 NOTE — H&P (Signed)
 Formatting of this note might be different from the original.  Images from the original note were not included.    History and Physical    Patient Name:         Miguel Carr  Date of Birth:           [birth year]  Medical Record #:   [id number]     Chief Complaint: Chest pain     History Of Present Illness  Miguel Carr is a 53 y.o. male with a past medical history of coronary artery disease and reported 3 heart attacks, paroxysmal atrial fibrillation on Xarelto , hypertension, hyperlipidemia, CVA with mild residual left-sided weakness, bowel obstruction, asthma, COPD, a longtime smoker, bipolar disorder, schizoaffective disorder, personality disorder and anxiety. Patient presented with left-sided chest pain radiating to the left face jaw and left arm, reports pressure feeling, states feels similar to his previous heart attacks. He reports mild shortness of breath. He recently moved from [place]  and does not have a PCP yet. States he had 3 heart attacks in the past, did not receive any stenting or bypass surgery. States his last MI was in 2023. He was given aspirin  by paramedics. His chest pain improved. EKG without ischemic changes. Troponin negative. Chest x-ray clear.     Past Medical History  He has a past medical history of Anxiety, Atrial fibrillation, COPD (chronic obstructive pulmonary disease), Coronary artery disease, Hyperlipidemia, Hypertension, Psychiatric disorder, and Stroke.    Surgical History  He has a past surgical history that includes Back surgery and Knee surgery.    Social History  He reports that he has been smoking cigarettes. He does not have any smokeless tobacco history on file. He reports that he does not currently use drugs after having used the following drugs: Marijuana. No history on file for alcohol use.    Family History  His family history is not on file.    Allergies  Adhesive, Diphenhydramine, Ketorolac, and Tramadol    Medications  (Not in a hospital  admission)    Review of Systems  14 organ systems reviewed and reported to be negative other than what is already mentioned above.    Physical Exam  Vitals and nursing note reviewed.   HENT:      Head: Normocephalic and atraumatic.      Mouth/Throat:      Mouth: Mucous membranes are moist.   Eyes:      Extraocular Movements: Extraocular movements intact.      Conjunctiva/sclera: Conjunctivae normal.      Pupils: Pupils are equal, round, and reactive to light.   Cardiovascular:      Rate and Rhythm: Regular rhythm. Tachycardia present.      Pulses: Normal pulses.      Heart sounds: Normal heart sounds.   Pulmonary:      Effort: Pulmonary effort is normal.      Breath sounds: Normal breath sounds.      Comments: Chest nontender  Abdominal:      General: Abdomen is flat. Bowel sounds are normal.      Palpations: Abdomen is soft.   Musculoskeletal:         General: Normal range of motion.      Cervical back: Normal range of motion and neck supple.   Skin:     General: Skin is warm.      Capillary Refill: Capillary refill takes less than 2 seconds.   Neurological:      General: No focal  deficit present.      Mental Status: He is oriented to person, place, and time. Mental status is at baseline.   Psychiatric:         Mood and Affect: Mood normal.     Last Recorded Vitals  Blood pressure 103/70, pulse 78, temperature 37.1 C (98.8 F), temperature source Oral, resp. rate 16, SpO2 95%.    Relevant Results  Labs EKG and imaging reviewed    Assessment/Plan   Chest pain, improved, no EKG changes, troponin negative X2  Reports history of CAD and 3 heart attacks  He is a longtime smoker  Trend troponin  Tele   Stress test  NTG prn   Continue aspirin  beta-blocker and statin  Advised smoking cessation    Paroxysmal A-fib on Xarelto   HTN  HLD  COPD, asthma  History of CVA  History of multiple psych disorders  Continue home medications  Lipid panel and A1c  DVT prophylaxis. Lovenox .  Patient will need refills for his  medications  Plan discussed with the patient  Electronically signed by Anda Katayama, MD at 06/28/2023  1:28 AM EDT  Electronically signed by Anda Katayama, MD at 06/28/2023  1:47 AM EDT  Electronically signed by Anda Katayama, MD at 06/28/2023  1:47 AM EDT  Electronically signed by Anda Katayama, MD at 06/28/2023  1:48 AM EDT  Electronically signed by Anda Katayama, MD at 06/28/2023  1:48 AM EDT  Electronically signed by Anda Katayama, MD at 06/28/2023  1:48 AM EDT  Electronically signed by Anda Katayama, MD at 06/28/2023  1:49 AM EDT  Electronically signed by Anda Katayama, MD at 06/28/2023  1:50 AM EDT  Electronically signed by Anda Katayama, MD at 06/28/2023  1:52 AM EDT  Electronically signed by Anda Katayama, MD at 06/28/2023  1:53 AM EDT  Electronically signed by Anda Katayama, MD at 06/28/2023  1:54 AM EDT  Electronically signed by Anda Katayama, MD at 06/28/2023  1:54 AM EDT

## 2023-06-30 ENCOUNTER — Inpatient Hospital Stay
Admit: 2023-06-30 | Discharge: 2023-07-01 | Payer: PRIVATE HEALTH INSURANCE | Attending: Student in an Organized Health Care Education/Training Program

## 2023-06-30 ENCOUNTER — Emergency Department: Admit: 2023-06-30 | Payer: PRIVATE HEALTH INSURANCE

## 2023-06-30 LAB — LIPID PROFILE (EXT)
Chol/HDL Ratio (EXT): 2.8 (ref 0.0–5.0)
Cholesterol (EXT): 133 mg/dL
HDL Cholesterol (EXT): 47 mg/dL (ref >=40)
LDL Cholesterol, CALC (EXT): 58 mg/dL
Triglycerides (EXT): 170 mg/dL — ABNORMAL HIGH

## 2023-06-30 LAB — CBC WITH AUTO DIFFERENTIAL
BKR WAM ABSOLUTE IMMATURE GRANULOCYTES.: 0.03 x 1000/ÂµL (ref 0.00–0.30)
BKR WAM ABSOLUTE LYMPHOCYTE COUNT.: 1.17 x 1000/ÂµL (ref 0.60–3.70)
BKR WAM ABSOLUTE NRBC (2 DEC): 0 x 1000/ÂµL (ref 0.00–1.00)
BKR WAM ANC (ABSOLUTE NEUTROPHIL COUNT): 6.19 x 1000/ÂµL (ref 2.00–7.60)
BKR WAM BASOPHIL ABSOLUTE COUNT.: 0.04 x 1000/ÂµL (ref 0.00–1.00)
BKR WAM BASOPHILS: 0.5 % (ref 0.0–1.4)
BKR WAM EOSINOPHIL ABSOLUTE COUNT.: 0.1 x 1000/ÂµL (ref 0.00–1.00)
BKR WAM EOSINOPHILS: 1.2 % (ref 0.0–5.0)
BKR WAM HEMATOCRIT (2 DEC): 39.4 % (ref 38.50–50.00)
BKR WAM HEMOGLOBIN: 12.9 g/dL — ABNORMAL LOW (ref 13.2–17.1)
BKR WAM IMMATURE GRANULOCYTES: 0.4 % (ref 0.0–1.0)
BKR WAM LYMPHOCYTES: 14.1 % — ABNORMAL LOW (ref 17.0–50.0)
BKR WAM MCH (PG): 28.5 pg (ref 27.0–33.0)
BKR WAM MCHC: 32.7 g/dL (ref 31.0–36.0)
BKR WAM MCV: 87 fL (ref 80.0–100.0)
BKR WAM MONOCYTE ABSOLUTE COUNT.: 0.79 x 1000/ÂµL (ref 0.00–1.00)
BKR WAM MONOCYTES: 9.5 % (ref 4.0–12.0)
BKR WAM MPV: 9.1 fL (ref 8.0–12.0)
BKR WAM NEUTROPHILS: 74.3 % — ABNORMAL HIGH (ref 39.0–72.0)
BKR WAM NUCLEATED RED BLOOD CELLS: 0 % (ref 0.0–1.0)
BKR WAM PLATELETS: 424 x1000/ÂµL — ABNORMAL HIGH (ref 150–420)
BKR WAM RDW-CV: 15.4 % — ABNORMAL HIGH (ref 11.0–15.0)
BKR WAM RED BLOOD CELL COUNT.: 4.53 M/ÂµL (ref 4.00–6.00)
BKR WAM WHITE BLOOD CELL COUNT: 8.3 x1000/ÂµL (ref 4.0–11.0)

## 2023-06-30 LAB — URINALYSIS-MACROSCOPIC W/REFLEX MICROSCOPIC
BKR BILIRUBIN, UA: NEGATIVE
BKR BLOOD, UA: NEGATIVE
BKR GLUCOSE, UA: NEGATIVE
BKR KETONES, UA: NEGATIVE
BKR NITRITE, UA: NEGATIVE
BKR PH, UA: 6 (ref 5.5–7.5)
BKR SPECIFIC GRAVITY, UA: 1.031 — ABNORMAL HIGH (ref 1.005–1.030)
BKR UROBILINOGEN, UA (MG/DL): <2 mg/dL (ref <=2.0)

## 2023-06-30 LAB — URINE DRUG SCREEN W/ NO CONF   (BH GH LMH YH)
BKR AMPHETAMINE SCREEN, URINE, NO CONF.: NEGATIVE
BKR BARBITURATE SCREEN, URINE, NO CONF.: NEGATIVE
BKR BENZODIAZEPINES SCREEN, URINE, NO CONF.: NEGATIVE
BKR CANNABINOIDS SCREEN, URINE, NO CONF.: POSITIVE — AB
BKR COCAINE SCREEN, URINE, NO CONF.: NEGATIVE
BKR METHADONE METABOLITE SCREEN, URINE, NO CONF.: NEGATIVE
BKR OPIATES SCREEN, URINE, NO CONF.: NEGATIVE
BKR OXYCODONE SCREEN, URINE, NO CONF.: NEGATIVE
BKR PHENCYCLIDINE (PCP) SCREEN, URINE, NO CONF.: NEGATIVE

## 2023-06-30 LAB — TROPONIN T HIGH SENSITIVITY, 1 HOUR WITH REFLEX (BH GH LMW YH)
BKR TROPONIN T HS 1 HOUR DELTA FROM 0 HOUR: 0 ng/L
BKR TROPONIN T HS 1 HOUR: 8 ng/L

## 2023-06-30 LAB — BLOOD GAS, VENOUS (BH YH)
BKR BASE EXCESS, VENOUS: 0 mmol/L
BKR CALCULATED HCO3, VENOUS: 22.8 mmol/L
BKR O2 SATURATION VENOUS: 91 %
BKR PCO2, VENOUS: 35 mmHg — ABNORMAL LOW (ref 38–54)
BKR PH, VENOUS: 7.43 U (ref 7.32–7.43)
BKR PO2, VENOUS: 59 mmHg — ABNORMAL HIGH (ref 40–50)
BKR TEMPERATURE (VENOUS BG COLL QUESTION): 99 [degF]

## 2023-06-30 LAB — URINE MICROSCOPIC     (BH GH LMW YH)
BKR RBC/HPF INSTRUMENT: 1 /HPF (ref 0–2)
BKR URINE SQUAMOUS EPITHELIAL CELLS, UA (NUMERIC): 2 /HPF (ref 0–5)
BKR WBC/HPF INSTRUMENT: 5 /HPF (ref 0–5)

## 2023-06-30 LAB — BASIC METABOLIC PANEL
BKR ANION GAP: 11 (ref 7–17)
BKR BLOOD UREA NITROGEN: 15 mg/dL (ref 6–20)
BKR BUN / CREAT RATIO: 21.4 (ref 8.0–23.0)
BKR CALCIUM: 9.1 mg/dL (ref 8.8–10.2)
BKR CHLORIDE: 106 mmol/L (ref 98–107)
BKR CO2: 21 mmol/L (ref 20–30)
BKR CREATININE: 0.7 mg/dL (ref 0.40–1.30)
BKR EGFR, CREATININE (CKD-EPI 2021): >60 mL/min/{1.73_m2} (ref >=60)
BKR GLUCOSE: 107 mg/dL — ABNORMAL HIGH (ref 70–100)
BKR POTASSIUM: 4.3 mmol/L (ref 3.3–5.3)
BKR SODIUM: 138 mmol/L (ref 136–144)

## 2023-06-30 LAB — ETHANOL     (BH GH L LMW YH): BKR ETHANOL: <10 mg/dL (ref <10)

## 2023-06-30 LAB — HEPATIC FUNCTION PANEL
BKR A/G RATIO: 1.5 (ref 1.0–2.2)
BKR ALANINE AMINOTRANSFERASE (ALT): 24 U/L (ref 9–59)
BKR ALBUMIN: 4 g/dL (ref 3.6–5.1)
BKR ALKALINE PHOSPHATASE: 109 U/L (ref 9–122)
BKR ASPARTATE AMINOTRANSFERASE (AST): 27 U/L (ref 10–35)
BKR AST/ALT RATIO: 1.1
BKR BILIRUBIN TOTAL: 0.2 mg/dL (ref <=1.2)
BKR GLOBULIN: 2.6 g/dL (ref 2.0–3.9)
BKR PROTEIN TOTAL: 6.6 g/dL (ref 5.9–8.3)

## 2023-06-30 LAB — FENTANYL, URINE, WITH NO CONFIRMATION (BH GH L LMW YH): BKR FENTANYL: NEGATIVE

## 2023-06-30 LAB — PARTIAL THROMBOPLASTIN TIME     (BH GH LMW Q YH): BKR PARTIAL THROMBOPLASTIN TIME: 28.2 s (ref 23.0–31.0)

## 2023-06-30 LAB — LIPID PANEL
BKR CHOLESTEROL/HDL RATIO: 2.8 (ref 0.0–5.0)
BKR CHOLESTEROL: 133 mg/dL
BKR HDL CHOLESTEROL: 47 mg/dL (ref >=40)
BKR LDL CHOLESTEROL SAMPSON CALCULATED: 58 mg/dL
BKR TRIGLYCERIDES: 170 mg/dL — ABNORMAL HIGH

## 2023-06-30 LAB — TROPONIN T HIGH SENSITIVITY, 0 HOUR BASELINE WITH REFLEX (BH GH LMW YH): BKR TROPONIN T HS 0 HOUR BASELINE: 8 ng/L

## 2023-06-30 LAB — LACTIC ACID, PLASMA (REFLEX 2H REPEAT): BKR LACTATE: 1 mmol/L (ref 0.4–2.0)

## 2023-06-30 MED ORDER — NITROGLYCERIN 0.4 MG SUBLINGUAL TABLET
0.4 | Freq: Once | SUBLINGUAL | Status: CP
Start: 2023-06-30 — End: ?
  Administered 2023-06-30: 21:00:00 0.4 mg via SUBLINGUAL

## 2023-06-30 MED ORDER — HEPARIN 25,000 UNIT/250 ML D5W (ACS/AFIB ALL SITES)
25000 | INTRAVENOUS | Status: DC
Start: 2023-06-30 — End: 2023-07-01
  Administered 2023-06-30: 22:00:00 25000 mL/h via INTRAVENOUS

## 2023-06-30 MED ORDER — HEPARIN BOLUS FOR BAG (YNH INTERMITTENT)
Freq: Four times a day (QID) | INTRAVENOUS | Status: DC | PRN
Start: 2023-06-30 — End: 2023-07-01

## 2023-06-30 MED ORDER — NICOTINE 7 MG/24 HR DAILY TRANSDERMAL PATCH
7 | Freq: Every day | TRANSDERMAL | Status: DC
Start: 2023-06-30 — End: 2023-07-01

## 2023-06-30 MED ORDER — SODIUM CHLORIDE 0.9 % (FLUSH) INJECTION SYRINGE
0.9 | INTRAVENOUS | Status: DC | PRN
Start: 2023-06-30 — End: 2023-07-01

## 2023-06-30 MED ORDER — MELATONIN 3 MG TABLET
3 | Freq: Every evening | ORAL | Status: DC | PRN
Start: 2023-06-30 — End: 2023-07-01

## 2023-06-30 MED ORDER — ROSUVASTATIN 40 MG TABLET
40 | Freq: Every day | ORAL | Status: DC
Start: 2023-06-30 — End: 2023-07-01
  Administered 2023-06-30: 21:00:00 40 mg via ORAL

## 2023-06-30 MED ORDER — HEPARIN BOLUS FROM BAG (YNH INTERMITTENT 2)
Freq: Four times a day (QID) | INTRAVENOUS | Status: DC | PRN
Start: 2023-06-30 — End: 2023-07-01

## 2023-06-30 MED ORDER — RIVAROXABAN 20 MG TABLET
20 | Freq: Every day | ORAL | Status: AC
Start: 2023-06-30 — End: ?

## 2023-06-30 MED ORDER — HEPARIN BOLUS FROM BAG (ACS/AF INITIAL BOLUS)
Freq: Once | INTRAVENOUS | Status: CP
Start: 2023-06-30 — End: ?
  Administered 2023-06-30: 22:00:00 53.000 mL via INTRAVENOUS

## 2023-06-30 MED ORDER — ASPIRIN 81 MG CHEWABLE TABLET
81 | Freq: Every day | ORAL | Status: DC
Start: 2023-06-30 — End: 2023-07-01

## 2023-06-30 MED ORDER — ACETAMINOPHEN 325 MG TABLET
325 | Freq: Four times a day (QID) | ORAL | Status: DC | PRN
Start: 2023-06-30 — End: 2023-07-01

## 2023-06-30 MED ORDER — POLYETHYLENE GLYCOL 3350 17 GRAM ORAL POWDER PACKET
17 | Freq: Every evening | ORAL | Status: DC | PRN
Start: 2023-06-30 — End: 2023-07-01

## 2023-06-30 MED ORDER — SODIUM CHLORIDE 0.9 % (FLUSH) INJECTION SYRINGE
0.9 | Freq: Three times a day (TID) | INTRAVENOUS | Status: DC
Start: 2023-06-30 — End: 2023-07-01
  Administered 2023-06-30: 22:00:00 0.9 mL via INTRAVENOUS

## 2023-06-30 MED ORDER — METOPROLOL TARTRATE (LOPRESSOR) IMMEDIATE RELEASE 12.5 MG HALFTAB
12.5 | Freq: Two times a day (BID) | ORAL | Status: DC
Start: 2023-06-30 — End: 2023-07-01
  Administered 2023-06-30: 21:00:00 12.5 mg via ORAL

## 2023-06-30 MED ORDER — METOPROLOL SUCCINATE ER 100 MG TABLET,EXTENDED RELEASE 24 HR
100 | Freq: Two times a day (BID) | ORAL | Status: AC
Start: 2023-06-30 — End: ?

## 2023-06-30 NOTE — Other
 June 30, 2023 YOUR PERSONALIZED LIST OF SERVICES & PROGRAMS ASSISTANCEPantryHealth Action of [place] (CHASI) - Food pantryPhone:  [phone] - Website:  https://chasiny.org - Language:  English, Spanish - Hours:  Mon 9:00 AM - 5:00 PM Tue 9:00 AM - 5:00 PM Wed 9:00 AM - 5:00 PM Thu 9:00 AM - 5:00 PM Fri 9:00 AM - 5:00 PM - Fee:  Free - Accessibility:  Translation servicesMealsLove - Prepared Meals Lasagna LoveWebsite:  https://www.west.com/ - Language:  English, Spanish - Hours:  Mon 8:00 AM - 5:00 PM Tue 8:00 AM - 5:00 PM Wed 8:00 AM - 5:00 PM Thu 8:00 AM - 5:00 PM Fri 8:00 AM - 5:00 PM - Fee:  FreeNutrition Benefits- ebtEDGEWebsite:  https://www.ebtedge.com/gov/portal/PortalHome.do - Language:  English& SHELTERCase ManagementTRADE COMMISSION [place] - FEDERAL TRADE COMMISSIONPhone:  [phone] - Website:  LimitBuy.nl - Language:  English, SpanishPayment Assistance- [facility] Patient Assistance ProgramPhone:  [phone] - Website:  HDTVBulletin.se - Language:  English - Hours:  Mon 8:00 AM - 5:00 PM Tue 8:00 AM - 5:00 PM Wed 8:00 AM - 5:00 PM Thu 8:00 AM - 5:00 PM Fri 8:00 AM - 5:00 PM - Fee:  Free IMPORTANT NUMBERS & WEBSITESAsk [facility] ([phone]). Housing Resources ([employer]) 211. Domestic Violence 215 700 2472. Pregnancy1-815-038-1578. Substance Abuse800-941-567-1498. Suicide 988. Child 828-238-7505. . Terryann Fiddler: These resources have been generated via the [employer]  Platform. [employer]  does not endorse any service providers mentioned in this resource list. [employer]  does not guarantee that the services mentioned in this resource list will be available to you or will improve your health or wellness.UTC

## 2023-06-30 NOTE — ED Notes
 6:55 PM Floor Handoff Telemetry: 	[x]  Yes		[]  NoCode Status:   [x]  Full		[]  DNR		[]  DNI		Other (specify):Safety Precautions: []  Fall Risk  []  Sitter   []  Restraints	[]  Suicidal	[x]  None	Other (specify):Mentation/Orientation:	 A&O (Self, person, place, time) x    4      	 Disoriented to:                    	 Special Accommodations: []  Hearing impaired   []  Blind  []  Nonverbal  []  Cognitive impairmentOxygenation Upon Admission: [x]  RA	[]  NC	[]  Venti  []  Simple Mask []  Other	Baseline O2 Status? [x]  Yes	[]  NoAmbulation: [x]  Independent	[]  Cane   []  Walker	[]  Wheelchair	[]  Bedbound		[]  Hemiplegic	[]  Paraplegic	[]  QuadraplegicEliminiation: [x]  Independent	[]  Commode	[]  Bedpan/Urinal  []  Straight Cath []  Foley cath			[]  Urostomy	[]  Colostomy	Other (specify):Diarrhea/Loose stool : []  1x within 24h  []  2x within 24h  []  3x within 24h  [x]  None 	C.Diff Order: 	[]  Ordered- needs to be collected             []  Collected-sent to lab             []  Resulted - Negative C.Diff             []  Resulted - Positive C.Diff[x]  Not Ordered   []  N/ASkin Alteration: []  Pressure Injury []  Wound [x]  None []  Skin not assessedDiet: [x]  Regular/No order placed	[]  NPO		Other (specify):IV Access: [x]  PIV   []  PICC    []  Port    [] Central line    []  A-line    Other (specify)IVF/GTT Running Upon ED Departure? [x]  No	    []  Yes (specify):Outstanding Meds/Treatments/Tests:Patient Belongings:Are the belongings documented?          [x]  No	    []  YesIs someone taking belongings home?   [x]  No     []  Yes  Who? (specify)                                   Phillips Hay, RN

## 2023-06-30 NOTE — ED Notes
 2:33 PM Pt presents to ED via EMS from home c/o R sided CP that started 30 mins prior to arrival. Pt AOx4, hx of A-fib, endorses pain radiates to his back, has been without his meds for 3 days due to losing them. Hx of A-fib. Pt also endorses dizziness during this time period. VSS. EKG unremarkable for EMS.

## 2023-06-30 NOTE — Utilization Review (ED)
 Utilization Review from XsolisPatient Data:  Patient Name: John Stout Age: 52 y.o. DOB: 1970/03/31	 MRN: [id number]	 Indicated Status: Observation Review Comments: Chest pain work up, tachycardia, telemetry. Dr. Athena Bland in agreement with observation status.

## 2023-06-30 NOTE — H&P
 [facility]		    [facility]													Internal Medicine H&PInpatient Attending: Neoma Laming, MDHospital Day: [facility] Source of information: patient, chartCC: chest painHPI Mr Graybeal is a 53 year old man with a hx of HTN/HLD, CAD s/p PCI (says 3 heart attacks, but no stent, most recent in 2020), Afib on Xarelto, x2 CVA, tobacco use, TBI/PTSD, who presented to the ED with chest pain.He was in his usual state of health until earlier today when he was getting out of bus and felt chest pressure that felt similar to prior heart attacks. He walked into an urgent visit, who then sent him to the ED.The chest pain is on the left side, feels like pressure, does not change with position, and radiates to L arm and jaw. No N/V. He says it feels similar to prior heart attacks. He often can walk but his R knee hurts and that limits his physical activity. No PND, no SOB.He says he lives in [place] and decided to move to [facility].He tells me he uses xarelto, metoprolol tartrate 100 bid, aspirin, but have not taken any of these in the last ~4 days. In the past he was also on lisinopril, statin, zofran, nitroglycerin.His chart review indicates multiple ED presentations in different hospital/states and AMA discharges.In the ED, vitals remarkable for HR 111 (NSR on tele), RR 18, BP 120/74, 97% on RA.BMP unremarkableLFTs unremarlable.CBC largely wnl.Troponin 8 and repeat 1-2 hr later 8.Lactate 1.0CXR unremarkableUtox remarkable for cannabinoids+UA largely normalEKG unremarkable.PMH/PSH Personality disorderVerbal aggressionSyncopeMeds: Continuous Infusions: heparin 25,000 units/250 ml infusion - ACS/Afib   MEDS  Scheduled Meds:heparin (porcine), 60 Units/kg, Intravenous, OncenitroGLYCERIN, 0.4 mg, Sublingual, Oncesodium chloride, 3 mL, IV Push, Q8H PRN Meds:acetaminophen, 650 mg, Q6H PRNheparin (porcine), 80 Units/kg, Q6H PRN Andheparin (porcine), 40 Units/kg, Q6H PRNmelatonin, 3 mg, Nightly PRNpolyethylene glycol, 17 g, Nightly PRNsodium chloride, 3 mL, PRN for Line Care Allergies Allergies Allergen Reactions  Toradol [Ketorolac] Hives   & vomiting per patient 2014.09.26 SP  Tramadol Hives   & vomiting per patient 2014.09.26 SP  Benadryl [Diphenhydramine Hcl] Other (See Comments)   Patient reports 'joints locked up' when given via IV.  Reglan [Metoclopramide] Other (See Comments)   Patient reports that his body tenses up and he has muscle spasms  Tape [place] Organ Tape-Silicones] Itching, Dermatitis and Edema   Clear Plastic Social History Challenging social historyRefused ETOH useSmokes tobaccoObjective: Vitals:Temp:  [99 ?F (37.2 ?C)] 99 ?F (37.2 ?C)Pulse:  [102-111] 103Resp:  [16-18] 18BP: (112-120)/(72-77) 115/72SpO2:  [94 %-97 %] 96 %Device (Oxygen Therapy): room air  I/O's:No intake or output data in the 24 hours ending 06/30/23 1913 Physical Exam:Gen: Disheveled, no acute distress, alert and oriented fullyCV: RRR, S1+S2+, no murmurs appreciatedPulm: Clear to ascultation b/lAbd: Soft, non-tender, non-distendedExt: Warm, no LE edema Pt was found eating food, appears hungry.Labs/Imaging: Recent Labs Lab 04/15/251450 WBC 8.3 HGB 12.9* HCT 39.40 PLT 424*  Recent Labs Lab 04/15/251450 NEUTROPHILS 74.3*  Recent Labs Lab 04/15/251450 NA 138 K 4.3 CL 106 CO2 21 BUN 15 CREATININE 0.70 GLU 107* ANIONGAP 11  Recent Labs Lab 04/15/251450 CALCIUM 9.1  Recent Labs Lab 04/15/251450 ALT 24 AST 27 ALKPHOS 109 BILITOT 0.2  No results for input(s): PTT, LABPROT, INR in the last 168 hours. ECG/Tele Events: NSRAssessment and Plan Mr Loiseau is a 53 year old man with a hx of HTN/HLD, CAD s/p PCI (says 3 heart attacks, but no stent, most recent in 2020), Afib on Xarelto, x2 CVA, tobacco use, TBI/PTSD, who presented to the ED with chest pain, appears to be  unstable angina.#likely unstable angina vs secondary gain?#Chest pain 6-7/10, is possibly cardiac origin, troponin negative (8-8), given chart history of multiple myocardial infarctions, reasonable to involve cardiology for ischemic eval. Given his challenge with R knee/exertion, and likely calcifications with prior MI hx, stress test or LHC may be reasonable, though, defer to cardiology (consulted). Patient had multiple cardiac caths in the past and per notes coronaries were clean (2015, 2019) and secondary gain was suspected.-s/p aspirin 325 by EMS, start aspirin 81 tomorrow until cardiology eval-Start heparin drip for #Hx Afib (currently in NSR), if no procedures expected, can switch to Xarelto-Start metoprolol tartrate 12.5 bid-Crestor 40 now-Nitroglycerin sublingual x1 for chest pain now-Cw telemetry-Lipid panel-A1c 5.8 (~1 wk ago)#CVA hx-On heparin drip and aspirin as above, no acute concerns#tobacco useNicotine patch #Houselessness#Pt seems hungry/food insecurity-Social work consult DIET: Regular now, NPO midnight pending cardiology evalPPX: Heparin drip for hx AfibDISPO: FloorCODE STATUS: FULL Selby Dage, MDPGY-2 Internal Medicine (Trad) - [facility] by [facility] June 30, 2023

## 2023-06-30 NOTE — ED Provider Notes
 Chief Complaint Patient presents with  Chest Pain   X . Hx of afib. From clinic. Lost meds x 3 days.  Denies SOB. HPI/PE:This is a 53 year old man with history of HTN TBI CAD (reports MI x3), strong family history of coronary artery disease, presents to the emergency department with substernal chest pain.  He has history of AFib, he reports that he lost his medication 3 days ago, denies shortness of breath cough and fever.  Patient given ASA by EMSUpon arrival patient is drowsy appearing with delayed psycho motor responses, I can reorient him tactile stimuli, heart is regular and tachycardic, lungs with scattered rhonchi no wheezes no rales, abdomen is benign no edema in the periphery neurologically intact.Differential diagnosis is cardiac ischemia pneumonia arrhythmia CHF.  Given patient's drowsiness some concern for intoxication.  Physical ExamED Triage VitalsBP: 120/74 [06/30/23 1426]Pulse: (!) 111 [06/30/23 1426]Pulse from  O2 sat: n/aResp: 18 [06/30/23 1426]Temp: 99 ?F (37.2 ?C) [06/30/23 1429]Temp src: n/aSpO2: 97 % [06/30/23 1426] BP 115/72  - Pulse (!) 103  - Temp 99 ?F (37.2 ?C)  - Resp 18  - Wt 88 kg  - SpO2 96%  - BMI 25.60 kg/m? Physical Exam ProceduresAttestation/Critical CarePatient Reevaluation: High sensitivity troponin 8/8 EKG without acute ischemic changes, but given patient's risk profile will require stress test. Will [facility].Clinical Impressions as of 06/30/23 1754 Chest pain  ED DispositionAdmit  Roslynn Coombes, MD04/15/25 1754

## 2023-07-01 DIAGNOSIS — Z91048 Other nonmedicinal substance allergy status: Secondary | ICD-10-CM

## 2023-07-01 DIAGNOSIS — R079 Chest pain, unspecified: Secondary | ICD-10-CM

## 2023-07-01 DIAGNOSIS — Z885 Allergy status to narcotic agent status: Secondary | ICD-10-CM

## 2023-07-01 DIAGNOSIS — Z888 Allergy status to other drugs, medicaments and biological substances status: Secondary | ICD-10-CM

## 2023-07-01 DIAGNOSIS — I252 Old myocardial infarction: Secondary | ICD-10-CM
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
## 2023-07-01 NOTE — Transfer Summaries
 Admission Note Nursing John Stout is a 53 y.o. male admitted with a chief complaint of chest pain. Patient arrived from [facility] ED. Patient is   alert and oriented times four. Vitals:  06/30/23 1747 06/30/23 1950 06/30/23 2013 07/01/23 0000 BP: 115/72  114/70 108/69 Pulse: (!) 103  88 66 Resp: 18  18  Temp:   98.2 ?F (36.8 ?C) 97.9 ?F (36.6 ?C) TempSrc:   Oral Oral SpO2: 96%  95% 97% Weight:  85.4 kg   Height:  6' 1 (1.854 m)   Oxygen therapy Oxygen TherapySpO2: 97 %Device (Oxygen Therapy): room airI have reviewed the patient's current medication orders..I have reviewed patient valuables Belongings charted in last 7 days: Patient Valuables   Patient Valuables Flowsheet                    PATIENT VALUABLE(S)       Cell phone disposition At bedside/locker/closet 06/30/23 1955  Jewelry disposition At bedside/locker/closet 06/30/23 1955   Other disposition At bedside/locker/closet 06/30/23 1955  Clothing Disposition At bedside/locker/closet 06/30/23 1955          Comments:see flowsheets for [employer], patient education and plan of care for additional information.

## 2023-07-01 NOTE — Significant Event
 53 year old man with a hx of HTN/HLD, CAD s/p PCI (says 3 heart attacks, but no stent, most recent in 2020), Afib on Xarelto, x2 CVA, tobacco use, TBI/PTSD, who presented to the ED with chest pain, troponin negative (8-8), differentials include unstable angina. Patient had multiple cardiac caths in the past and per notes coronaries were clean (2015, 2019) and secondary gain was suspected. Patient left AMA today morning.

## 2023-07-02 ENCOUNTER — Encounter: Admit: 2023-07-02 | Payer: PRIVATE HEALTH INSURANCE

## 2023-07-03 ENCOUNTER — Encounter: Admit: 2023-07-03 | Payer: PRIVATE HEALTH INSURANCE

## 2023-07-07 ENCOUNTER — Inpatient Hospital Stay
Admit: 2023-07-07 | Discharge: 2023-07-07 | Disposition: A | Discharge status: Home / Self Care | Attending: Emergency Medicine

## 2023-07-07 ENCOUNTER — Emergency Department: Admit: 2023-07-07

## 2023-07-07 DIAGNOSIS — I2510 Atherosclerotic heart disease of native coronary artery without angina pectoris: Secondary | ICD-10-CM

## 2023-07-07 LAB — TROPONIN T, HIGH SENSITIVITY: Troponin T, High Sensitivity: <6 ng/L (ref <12)

## 2023-07-07 LAB — COMPREHENSIVE METABOLIC PANEL
ALT: 28 U/L (ref 0–55)
AST: 33 U/L (ref 6–42)
Albumin: 4.3 g/dL (ref 3.2–5.0)
Alkaline phosphatase: 108 U/L (ref 30–130)
Anion Gap: 13 mmol/L (ref 3–14)
BUN: 15 mg/dL (ref 6–24)
Bilirubin, total: <0.2 mg/dL — ABNORMAL LOW (ref 0.2–1.2)
CO2 (Bicarbonate): 20 mmol/L (ref 20–32)
Calcium: 9 mg/dL (ref 8.5–10.5)
Chloride: 105 mmol/L (ref 98–110)
Creatinine: 0.7 mg/dL (ref 0.55–1.30)
Glucose: 95 mg/dL (ref 70–139)
Potassium: 4 mmol/L (ref 3.6–5.2)
Protein, total: 6.6 g/dL (ref 6.0–8.4)
Sodium: 138 mmol/L (ref 135–146)
eGFRcr: 112 mL/min/{1.73_m2} (ref >=60)

## 2023-07-07 LAB — CBC WITH DIFFERENTIAL
Basophils %: 0.4 %
Basophils Absolute: 0.02 10*3/uL (ref 0.00–0.22)
Eosinophils %: 3 %
Eosinophils Absolute: 0.14 10*3/uL (ref 0.00–0.50)
Hematocrit: 37.7 % (ref 37.0–53.0)
Hemoglobin: 12.6 g/dL — ABNORMAL LOW (ref 13.0–17.5)
Immature Granulocytes %: 0.2 %
Immature Granulocytes Absolute: 0.01 10*3/uL (ref 0.00–0.10)
Lymphocyte %: 29.6 %
Lymphocytes Absolute: 1.39 10*3/uL (ref 0.70–4.00)
MCH: 28.8 pg (ref 26.0–34.0)
MCHC: 33.4 g/dL (ref 31.0–37.0)
MCV: 86.1 fL (ref 80.0–100.0)
MPV: 8.7 fL — ABNORMAL LOW (ref 9.1–12.4)
Monocytes %: 11.5 %
Monocytes Absolute: 0.54 10*3/uL (ref 0.38–0.83)
NRBC %: 0 % (ref 0.0–0.0)
NRBC Absolute: 0 10*3/uL (ref 0.00–2.00)
Neutrophil %: 55.3 %
Neutrophils Absolute: 2.6 10*3/uL (ref 1.50–7.95)
Platelets: 320 10*3/uL (ref 150–400)
RBC: 4.38 M/uL (ref 4.20–5.90)
RDW-CV: 15.4 % — ABNORMAL HIGH (ref 11.5–14.5)
RDW-SD: 48.8 fL (ref 35.0–51.0)
WBC: 4.7 10*3/uL (ref 4.0–11.0)

## 2023-07-07 LAB — BMP (EXT)
Anion Gap (EXT): 11 mmol/L (ref 3–15)
BUN (EXT): 17 mg/dL (ref 6–23)
CO2 (EXT): 21 mmol/L — ABNORMAL LOW (ref 22–31)
CalciumCalcium (EXT): 9.1 mg/dL (ref 8.6–10.7)
Chloride (EXT): 103 mmol/L (ref 98–107)
Creatinine (EXT): 0.67 mg/dL (ref 0.50–1.20)
GFR Estimated (Calc) (EXT): 113 mL/min/{1.73_m2} (ref >60)
Glucose (EXT): 95 mg/dL (ref 70–115)
Sodium (EXT): 135 mmol/L — ABNORMAL LOW (ref 136–145)

## 2023-07-07 LAB — SDS, SERUM DRUG SCREEN
Acetaminophen level: <5 ug/mL (ref <=30)
Benzodiazepines screen, serum: NOT DETECTED
Ethanol level, plasma/serum: <10 mg/dL (ref <=10)
Salicylate level: <0.5 mg/dL (ref 0.0–29.9)
Tricyclics screen, serum: NOT DETECTED

## 2023-07-07 LAB — NT PRO BNP: NT-proBNP: 38 pg/mL (ref 0–125)

## 2023-07-07 MED ORDER — rivaroxaban (Xarelto) 20 mg tablet
20 | ORAL_TABLET | Freq: Every day | ORAL | 0 refills | 30.00000 days | Status: AC
Start: 2023-07-07 — End: 2023-07-17
  Filled 2023-07-07: qty 10, 10d supply, fill #0

## 2023-07-07 MED ORDER — metoprolol tartrate (Lopressor) 100 mg tablet
100 | ORAL_TABLET | Freq: Two times a day (BID) | ORAL | 0 refills | Status: AC
Start: 2023-07-07 — End: 2023-07-17
  Filled 2023-07-07: qty 20, 10d supply, fill #0

## 2023-07-07 NOTE — Unmapped (Signed)
 [facility] - Emergency Department  PROVIDER IN TRIAGE NOTE    PT is a 53 y.o. male PMH CAD, MIs, CVAs, hypertension, hyperlipidemia, chronic A-fib, hyperthyroidism, marijuana abuse, and schizophrenia who presents with left sided chest pain x 20 minutes (2:30 PM).    Reports it radiates to his left arm and back. Reports tingling in the left arm. Associated difficulty breathing described as breathing underwater. Mentions posterior and frontal headache with bilateral blurred vision. Symptoms began while getting in the car 20 minutes ago (~2:30 PM). Lives in [place], [place] with daughter.     Per chart review, patient seen at [facility] this morning for constipation with numerous other ED visits.     Interpreter: none    PAST MEDICAL HISTORY / PROBLEM LIST    Medical History[1]    Problem List[2]    PAST SURGICAL HISTORY    Surgical History[3]    TRIAGE VITALS    ED Triage Vitals [07/07/23 1448]   Temp Pulse Resp BP   37.1 C (98.8 F) 97 18 113/72      SpO2 Temp Source Heart Rate Source Patient Position   99 % Temporal Monitor Sitting      BP Location FiO2 (%)     Left arm --          PE:   Adult in no acute distress  Speaking full sentences  Ambulatory with steady gait  Moves all extremities equally  No facial droop  +Pronator drift (left arm)    Triage MDM/Plan   PT is a 53 y.o. male who presents with chest pain, SOB, headache, visual changes, left arm tingling.  Will likely proceed with ECG, labs, CTH.  Please refer to the ED provider's note for further detailed plan and disposition.    Medical screening evaluation was performed in triage and workup was initiated. Patient was advised further evaluation and treatment may be required and advised not to leave the Emergency Department until completed.       [1]   Past Medical History:  Diagnosis Date    Hypertension      MI (mitral incompetence)     Stroke (Multi-HCC)    [2] There is no problem list on file for this patient.   [3] History reviewed. No pertinent surgical  history.       Delmon Ferretti, [place]  07/07/23 1457

## 2023-07-07 NOTE — ED Provider Notes (Signed)
 ATTENDING NOTE    Chief Complaint   Patient presents with    Chest Pain     History of Present Illness  Electronic Medical Records Reviewed: Yes  Medical Interpreter Used: No  History Reviewed and Discussed with None    53 year old male patient with PMHx CAD, MI, CVA, HTN, HLD, Afib, hyperthyroidism, schizophrenia presents for evaluation of chest pain and dizziness. Patient reports that his symptoms initially began with severe chest pain which decreased in intensity, however he reports it currently feels like someone is "squeezing" his chest. He additionally endorses dizziness and new vision changes that began one hour PTA, states that whenever he closes and opens his eyes, he has blurry and double vision which eventually resolves itself. Patient reports he lost his medications recently and last took them 2 days ago. He reports he smokes marijuana and cigarettes, denies alcohol use and other drug use.   Of note, patient reports he was seen at [facility] for the same symptoms 2 days ago. At that time, his Troponins were normal, D-Dimer was negative, Hemoglobin was 12. While his TSH was low, T3 and T4 were normal. Echo showed normal left ventricular function, no pericardial effusion, no ventricular enlargement. EKG showed nonspecific IVCD, but no acute ischemia. CT head showed left maxillary sinus disease. CXR was normal. Additionally, patient was seen at [facility] earlier today for abdominal pain and had a CTAP done which revealed no acute pathology.    History/Exam limitations: none.    Past Medical/Surgical History    Medical History[1]  Problem List[2]  Surgical History[3]    Medications    Current Discharge Medication List        CONTINUE these medications which have NOT CHANGED    Details   aspirin 81 mg chewable tablet Chew 81 mg.      atorvastatin (Lipitor) 20 mg tablet Take 20 mg by mouth.      lisinopril 20 mg tablet Take 20 mg by mouth.      nitroglycerin (Nitrostat) 0.4 mg SL  tablet Place 0.4 mg under the tongue every 5 (five) minutes if needed.              Allergies    Allergies[4]    Social History    Social History     Tobacco Use    Smoking status: Every Day     Types: Cigarettes    Smokeless tobacco: Never   Substance Use Topics    Alcohol use: Not on file     Social History     Substance and Sexual Activity   Drug Use Not on file       Family History    Family History[5]    Review of Systems     Review of Systems   Constitutional:  Negative for chills and fever.   HENT:  Negative for ear pain and sore throat.    Eyes:  Negative for pain.        Positive for blurry vision.    Respiratory:  Negative for cough and shortness of breath.    Cardiovascular:  Positive for chest pain. Negative for palpitations.   Gastrointestinal:  Negative for abdominal pain and vomiting.   Genitourinary:  Negative for dysuria and hematuria.   Musculoskeletal:  Negative for arthralgias and back pain.   Skin:  Negative for color change and rash.   Neurological:  Positive for dizziness. Negative for seizures and syncope.   All other systems reviewed  and are negative.        Physical Exam  ED Triage Vitals [07/07/23 1448]   Temp Pulse Resp BP   37.1 C (98.8 F) 97 18 113/72      SpO2 Temp Source Heart Rate Source Patient Position   99 % Temporal Monitor Sitting      BP Location FiO2 (%)     Left arm --          Head: NC/AT  Eyes: Visual fields grossly intact. Visual acuity is 20/30 OD, OS, and OU. No change with pinhole correction.  No photophobia.  Pupils equally round and reactive to light.  No hyphema.  No subconjunctival hemorrhage.  No nystagmus.  Funduscopic exam with flat discs.  ENT: Moist mucous membranes, OP clear, TMs clear b/l.  Neck: Soft and supple, full ROM, no adenopathy, no c-spine tenderness.   Chest: No palpable tenderness, no e/o trauma.  Lungs: Scant rhonchi at the left base.  CVS: RRR, normal S1 and S2, no m/r.  Abdomen: Soft, nontender, no palpable masses, no guarding or rebound.    Extremities: Full ROM of all joints, NVI. No pitting edema.   Back: No signs of trauma. No spinous process tenderness, no CVAT.   Skin: Intact, no e/o cellulitis.  Neuro: Awake and alert, MAE, no facial asymmetry, no gross focal deficits.      Labs/Imaging  Labs Reviewed   COMPREHENSIVE METABOLIC PANEL - Abnormal       Result Value    Sodium 138      Potassium 4.0      Chloride 105      CO2 (Bicarbonate) 20      Anion Gap 13      BUN 15      Creatinine 0.70      eGFRcr 112      Glucose 95      Fasting? Unknown      Calcium 9.0      AST 33      ALT 28      Alkaline phosphatase 108      Protein, total 6.6      Albumin 4.3      Bilirubin, total <0.2 (*)    CBC WITH DIFFERENTIAL - Abnormal    WBC 4.7      RBC 4.38      Hemoglobin 12.6 (*)     Hematocrit 37.7      MCV 86.1      MCH 28.8      MCHC 33.4      RDW-CV 15.4 (*)     RDW-SD 48.8      Platelets 320      MPV 8.7 (*)     Neutrophil % 55.3      Lymphocyte % 29.6      Monocytes % 11.5      Eosinophils % 3.0      Basophils % 0.4      Immature Granulocytes % 0.2      NRBC % 0.0      Neutrophils Absolute 2.60      Lymphocytes Absolute 1.39      Monocytes Absolute 0.54      Eosinophils Absolute 0.14      Basophils Absolute 0.02      Immature Granulocytes Absolute 0.01      NRBC Absolute 0.00     NT PRO BNP - Normal    NT-proBNP 38      Narrative:     The  2021 Universal Definition and Classification of Heart Failure states that a diagnosis of heart failure (HF) requires symptoms and/or signs of HF caused by structural and/or functional cardiac abnormalities, accompanied by objective evidence of congestion and/or:     Test             Ambulatory Patients    Hospitalized/Decompensated   NT-proBNP (pg/mL)   >=125                  >=300         Patients with heart failure with preserved ejection fraction (HFpEF) have lower levels of the natriuretic peptides than patients with a similar severity of heart failure with reduced ejection fraction (HFrEF).     In dyspneic  patients in emergency department settings,2 NT-proBNP cut-offs for diagnosis of acute HF were 450, 900, and 1,800 pg/ml for ages <50, 50 to 11, and >75 years.     (1) Bozkurt B et al.  J Cardiac Fail 2021;27:387-413   (2) Darien Eden et al. J Am Coll Cardiol 2018;71:1191-1200.    TROPONIN T, HIGH SENSITIVITY - Normal    Troponin T, High Sensitivity <6     SDS, SERUM DRUG SCREEN - Normal    Acetaminophen  level <5      Ethanol level, plasma/serum <10      Salicylate level <0.5      Benzodiazepines screen, serum Not Detected      Tricyclics screen, serum Not Detected     CBC W/DIFF    Narrative:     The following orders were created for panel order CBC and differential.  Procedure                               Abnormality         Status                     ---------                               -----------         ------                     CBC w/ Differential[[id number]]          Abnormal            Final result                 Please view results for these tests on the individual orders.   URINE DRUG SCREEN      XR CHEST 2 VIEWS   Final Result   FINDINGS AND IMPRESSION:      No consolidation, pleural effusion, or pneumothorax.      Cardiomediastinal silhouette is . No acute bone abnormality.       APPROVED BY STAFF RADIOLOGIST: Tomy France 07/07/2023 4:10 PM EDT        X-rays independently visualized and interpreted by me:  CXR: Limited inspiration, no focal infiltrates, no effusions, no opacifications, no significant change from 02/2021.     EKG  EKG independently visualized and interpreted by me:  Sinus at 85, intervals of 0.12, 0.10, 0.4, axis of 60 degrees, no ischemia.        ED Course  Diagnoses as of 07/07/23 1802   Coronary artery disease involving native heart, unspecified vessel or lesion  type, unspecified whether angina present    Atrial fibrillation, unspecified type (Multi-HCC)   Chest pain, unspecified type     Medical Decision Making  Nursing notes reviewed and agreed with.    53 year old male patient  was seen and evaluated for chest pain and dizziness. Physical exam is as documented. Labs and vitals were unremarkable. CXR showed no significant change from 02/2021. EKG showed no acute pathology.  Records were reviewed from outside hospitals over the past 48 hours which provided a great deal of information and eliminated the necessity to repeat certain testing as it was just done.  Evaluation in the ED showed no evidence of an emergent medical condition, patient was discharged in stable condition with return precautions and instructions to follow up with his PCP. Patient understands and agrees with the plan of their discharge. All questions were answered and they were happy with their overall care. There were no untoward events during their ED course.     EXTERNAL RECORDS REVIEWED  Reviewed [facility] discharge summary from two days ago. Reviewed [facility] discharge summary from earlier today.     INDEPENDENT INTERPRETATIONS  X-rays and EKG independently interpreted by me, details documented in the chart above.     TEST/TREATMENT/HOSPITALIZATION CONSIDERED  Labs, CXR, EKG    CHRONIC CONDITIONS  CAD, MI, CVA, HTN, HLD, Afib, hyperthyroidism, schizophrenia    ACUTE CONDITIONS  Chest pain, dizziness       Final Disposition: Discharged      Clinical Impression  1. Coronary artery disease involving native heart, unspecified vessel or lesion type, unspecified whether angina present     2. Atrial fibrillation, unspecified type (Multi-HCC)    3. Chest pain, unspecified type        By signing my name below, I, Jilda Most, attest that this documentation has been prepared under the direction and presence of Dr. Quentin Brunner, MD       Electronically signed: Jilda Most, Scribe. 07/07/23 6:02 PM.    Consent to use a scribe was obtained prior to the start of the encounter by clinical staff.      Scribe attestation: I have personally performed the services described in this documentation, as written by my scribe above in my  presence.  It is both accurate and complete when signed by me.  I have performed any pertinent edits.         [1]   Past Medical History:  Diagnosis Date    Hypertension      MI (mitral incompetence)     Stroke (Multi-HCC)    [2] There is no problem list on file for this patient.   [3] History reviewed. No pertinent surgical history.  [4]   Allergies  Allergen Reactions    Ketorolac Hives, Rash, Shortness of breath and Unknown     Other reaction(s): GI Discomfort  Vomit  Vomit      Tramadol Hives, Itching, Rash and Anaphylaxis    Benadryl [Diphenhydramine Hcl]      IV benadryl; can tolerate oral    Reglan [Metoclopramide Hcl]    [5] No family history on file.       Quentin Brunner, MD  07/08/23 [phone]

## 2023-07-07 NOTE — ED Progress Note (Signed)
 SW consulted to assist pt with obtaining medicine from the [facility] outpatient pharmacy. The ED pharmacist is working with [facility] pharmacy. If the pt has his insurance card, there will be no charge. If he does not, there is a $5 fee per medication. SW discussed this with the pt. He denies having any funds. He did provide sw with an out of state medicaid card, which sw copied and gave to the pharmacist. They needed that pt's zip code as the card did not have Rx contact number. Sw got both the [place] [address] and the [place] [address] zip  codes and presented them to pharmacy. However, the pharmacy chose to use a coupon for one medication and waived the other fee, so there was no cost to the pt. SW gave the medications to the pt's RN who will give them to the pt.

## 2023-07-07 NOTE — ED Notes (Addendum)
 Pt ambulated in to ED after feeling sudden chest "squeezing" and heart beating similar to afib when visiting a friend. Pt reports associated headache, dizziness, and double vision when he opens his eyes, denies pain is radiating, denies SOB/dyspnea. Pt states he had a syncopal episode yesterday and was seen at OSH who cleared him for DC. Pt has hx of afib, MI x2, CVA x2 no deficits. Pt has not taken his medications for at least 2 days including xarelto  and BB.        Verneda Golder, RN  07/07/23 1539

## 2023-07-07 NOTE — Other (Signed)
 Patient Education  Table of Contents   Nonspecific Chest Pain, Adult    To view videos and all your education online visit,  https://pe.elsevier.com/[email or web address]  or scan this QR code with your smartphone.  Access to this content will expire in one year.  Nonspecific Chest Pain, Adult  Chest pain is an uncomfortable, tight, or painful feeling in the chest. The pain can feel like a crushing, aching, or squeezing pressure. A person can feel a burning or tingling sensation. Chest pain can also be felt in your back, neck, jaw, shoulder, or arm. This pain can be worse when you move, sneeze, or take a deep breath.  Chest pain can be caused by a condition that is life-threatening. This must be treated right away. It can also be caused by something that is not life-threatening. If you have chest pain, it can be hard to know the difference, so it is important to get help right away to make sure that you do not have a serious condition.  Some life-threatening causes of chest pain include:   Heart attack.   A tear in the body's main blood vessel (aortic dissection).   Inflammation around your heart (pericarditis).   A problem in the lungs, such as a blood clot (pulmonary embolism) or a collapsed lung (pneumothorax).  Some non life-threatening causes of chest pain include:   Heartburn.   Anxiety or stress.   Damage to the bones, muscles, and cartilage that make up your chest wall.   Pneumonia or bronchitis.   Shingles infection (varicella-zoster virus).  Your chest pain may come and go. It may also be constant. Your health care provider will do tests and other studies to find the cause of your pain. Treatment will depend on the cause of your chest pain.  Follow these instructions at home:  Medicines   Take over-the-counter and prescription medicines only as told by your health care provider.   If you were prescribed an antibiotic medicine, take it as told by your health care provider. Do not stop taking the antibiotic even if you  start to feel better.  Activity   Avoid any activities that cause chest pain.   Do not lift anything that is heavier than 10 lb (4.5 kg), or the limit that you are told, until your health care provider says that it is safe.   Rest as directed by your health care provider.    Return to your normal activities only as told by your health care provider. Ask your health care provider what activities are safe for you.  Lifestyle       Do not use any products that contain nicotine or tobacco, such as cigarettes, e-cigarettes, and chewing tobacco. If you need help quitting, ask your health care provider.   Do not drink alcohol.   Make healthy lifestyle changes as recommended. These may include:  ? Getting regular exercise. Ask your health care provider to suggest some exercises that are safe for you.  ? Eating a heart-healthy diet. This includes plenty of fresh fruits and vegetables, whole grains, low-fat (lean) protein, and low-fat dairy products. A dietitian can help you find healthy eating options.  ? Maintaining a healthy weight.  ? Managing any other health conditions you may have, such as high blood pressure (hypertension) or diabetes.  ? Reducing stress, such as with yoga or relaxation techniques.  General instructions   Pay attention to any changes in your symptoms.   It is up to you to get  the results of any tests that were done. Ask your health care provider, or the department that is doing the tests, when your results will be ready.   Keep all follow-up visits as told by your health care provider. This is important.    You may be asked to go for further testing if your chest pain does not go away.  Contact a health care provider if:   Your chest pain does not go away.   You feel depressed.   You have a fever.   You notice changes in your symptoms or develop new symptoms.  Get help right away if:   Your chest pain gets worse.   You have a cough that gets worse, or you cough up blood.   You have severe pain in your  abdomen.   You faint.   You have sudden, unexplained chest discomfort.   You have sudden, unexplained discomfort in your arms, back, neck, or jaw.   You have shortness of breath at any time.   You suddenly start to sweat, or your skin gets clammy.   You feel nausea or you vomit.   You suddenly feel lightheaded or dizzy.   You have severe weakness, or unexplained weakness or fatigue.   Your heart begins to beat quickly, or it feels like it is skipping beats.  These symptoms may represent a serious problem that is an emergency. Do not wait to see if the symptoms will go away. Get medical help right away. Call your local emergency services (911 in the [place] Do not drive yourself to the hospital.  Summary   Chest pain can be caused by a condition that is serious and requires urgent treatment. It may also be caused by something that is not life-threatening.   Your health care provider may do lab tests and other studies to find the cause of your pain.   Follow your health care provider's instructions on taking medicines, making lifestyle changes, and getting emergency treatment if symptoms become worse.   Keep all follow-up visits as told by your health care provider. This includes visits for any further testing if your chest pain does not go away.  This information is not intended to replace advice given to you by your health care provider. Make sure you discuss any questions you have with your health care provider.  Document Released: 2004-12-11 Document Updated: 2022-01-16 Document Reviewed: 2022-01-16  Elsevier Patient Education ? 2025 [employer].

## 2023-07-07 NOTE — ED Triage Notes (Addendum)
 Patient ambulatory into triage reporting left-sided chest tightness/pain, SOB, headache, tingling to left arm that started 20 min PTA. Endorses blurred vision. - photophobia. Patient states that he was at a friend's house when symptoms began. Patient is speaking in complete sentences, speech is clear. Denies n/v/d, fevers/chills.     Patient had a fall recently; seeked medical attention and was cleared.     Patient seen at [facility] this morning for constipation and noted to have numerous previous ED visits.     PMH: CAD, MIs, CVAs, hypertension, hyperlipidemia, chronic A-fib, hyperthyroidism, marijuana abuse, and schizophrenia

## 2023-07-07 NOTE — ED Notes (Signed)
 Pt provided with discharge paperwork, follow up care discussed, all questions answered. PIV removed and site dressed with gauze and tegaderm. Pt gathered all belongings and dressed independently. Pt medication delivered to bedside by CM. Pt ambulated out of ED with steady gait.       Verneda Golder, RN  07/07/23 1756

## 2023-07-07 NOTE — Discharge Instructions (Signed)
 You were seen and evaluated for chest pain.  Your evaluation in the emergency department showed no evidence of acute heart or lung disease.  This coupled with your evaluation 2 days ago at [facility] is very reassuring that there is no acute process occurring, and that you can follow-up with your primary care physician.  With respect to your blurred vision, you had a head CT 36 hours ago which was normal, and your visual acuity is 20/30.  We asked that you make an appointment and follow-up with ophthalmology.  You have been given a 10-day prescription for both your metoprolol , and Xarelto ; this will provide time for you to contact your primary care physician to get appropriate refills.

## 2023-07-17 LAB — LIPID PROFILE (EXT)
Cholesterol (EXT): 141 mg/dL (ref <200)
HDL Cholesterol (EXT): 48 mg/dL — ABNORMAL LOW (ref >59)
LDL Cholesterol, CALC (EXT): 74 mg/dL (ref <100)
NON HDL Cholesterol (EXT): 93 mg/dL (ref <130)
Triglycerides (EXT): 93 mg/dL (ref <150)
VLDL Cholesterol (EXT): 19 mg/dL (ref <31)

## 2023-07-22 ENCOUNTER — Inpatient Hospital Stay: Admit: 2023-07-22 | Discharge: 2023-07-22 | Discharge status: Against Medical Advice | Attending: Emergency Medicine

## 2023-07-22 ENCOUNTER — Emergency Department

## 2023-07-22 ENCOUNTER — Inpatient Hospital Stay: Admit: 2023-07-22 | Discharge: 2023-07-23 | Discharge status: Against Medical Advice

## 2023-07-22 DIAGNOSIS — R109 Unspecified abdominal pain: Secondary | ICD-10-CM

## 2023-07-22 DIAGNOSIS — R1031 Right lower quadrant pain: Secondary | ICD-10-CM

## 2023-07-22 LAB — COMPREHENSIVE METABOLIC PANEL
ALT: 22 U/L (ref 0–55)
AST: 31 U/L (ref 6–42)
Albumin: 4.2 g/dL (ref 3.2–5.0)
Alkaline phosphatase: 115 U/L (ref 30–130)
Anion Gap: 11 mmol/L (ref 3–14)
BUN: 11 mg/dL (ref 6–24)
Bilirubin, total: 0.3 mg/dL (ref 0.2–1.2)
CO2 (Bicarbonate): 22 mmol/L (ref 20–32)
Calcium: 8.6 mg/dL (ref 8.5–10.5)
Chloride: 105 mmol/L (ref 98–110)
Creatinine: 0.69 mg/dL (ref 0.55–1.30)
Glucose: 98 mg/dL (ref 70–139)
Potassium: 4.4 mmol/L (ref 3.6–5.2)
Protein, total: 6.5 g/dL (ref 6.0–8.4)
Sodium: 138 mmol/L (ref 135–146)
eGFRcr: 112 mL/min/{1.73_m2} (ref >=60)

## 2023-07-22 LAB — CBC WITH DIFFERENTIAL
Basophils %: 0.6 %
Basophils Absolute: 0.03 10*3/uL (ref 0.00–0.22)
Eosinophils %: 3 %
Eosinophils Absolute: 0.16 10*3/uL (ref 0.00–0.50)
Hematocrit: 40.8 % (ref 37.0–53.0)
Hemoglobin: 13.4 g/dL (ref 13.0–17.5)
Immature Granulocytes %: 0.4 %
Immature Granulocytes Absolute: 0.02 10*3/uL (ref 0.00–0.10)
Lymphocyte %: 20.6 %
Lymphocytes Absolute: 1.1 10*3/uL (ref 0.70–4.00)
MCH: 28.4 pg (ref 26.0–34.0)
MCHC: 32.8 g/dL (ref 31.0–37.0)
MCV: 86.4 fL (ref 80.0–100.0)
MPV: 8.7 fL — ABNORMAL LOW (ref 9.1–12.4)
Monocytes %: 10.5 %
Monocytes Absolute: 0.56 10*3/uL (ref 0.38–0.83)
NRBC %: 0 % (ref 0.0–0.0)
NRBC Absolute: 0 10*3/uL (ref 0.00–2.00)
Neutrophil %: 64.9 %
Neutrophils Absolute: 3.46 10*3/uL (ref 1.50–7.95)
Platelets: 294 10*3/uL (ref 150–400)
RBC: 4.72 M/uL (ref 4.20–5.90)
RDW-CV: 14.5 % (ref 11.5–14.5)
RDW-SD: 46.3 fL (ref 35.0–51.0)
WBC: 5.3 10*3/uL (ref 4.0–11.0)

## 2023-07-22 LAB — SDS, SERUM DRUG SCREEN
Acetaminophen level: <5 ug/mL (ref <=30)
Benzodiazepines screen, serum: NOT DETECTED
Ethanol level, plasma/serum: <10 mg/dL (ref <=10)
Salicylate level: <0.5 mg/dL (ref 0.0–29.9)
Tricyclics screen, serum: NOT DETECTED

## 2023-07-22 LAB — LIPASE: Lipase: 23 U/L (ref 8–60)

## 2023-07-22 MED ORDER — acetaminophen (Tylenol) tablet 650 mg
325 | Freq: Once | ORAL | Status: DC
Start: 2023-07-22 — End: 2023-07-22

## 2023-07-22 NOTE — Unmapped (Signed)
 [facility] - Emergency Department  PROVIDER IN TRIAGE NOTE    PT is a 53 y.o. male  who presents with RLQ pain. Patient was seen here in ED this AM and left AMA prior to CT imaging. Returning now due to worsening pain.    PAST MEDICAL HISTORY / PROBLEM LIST    Medical History[1]    Problem List[2]    PAST SURGICAL HISTORY    Surgical History[3]    TRIAGE VITALS    ED Triage Vitals [07/22/23 1933]   Temp Pulse Resp BP   36.3 C (97.4 F) 104 20 101/65      SpO2 Temp Source Heart Rate Source Patient Position   96 % Temporal Monitor Sitting      BP Location FiO2 (%)     Left arm --          PHYSICAL EXAM    Awake, alert, conversant.   RLQ tenderness    Triage MDM/Plan     Plan for CT, UA, labs and ED physician eval     Sophia Dustman, PA-C     Medical screening evaluation was performed in triage and workup was initiated. Patient was advised further evaluation and treatment may be required and advised not to leave the Emergency Department until completed.       [1]   Past Medical History:  Diagnosis Date    Hypertension      MI (mitral incompetence)     Stroke (Multi-HCC)    [2] There is no problem list on file for this patient.   [3] History reviewed. No pertinent surgical history.       Sophia Dustman, [place]  07/22/23 1935

## 2023-07-22 NOTE — Discharge Instructions (Signed)
 You were seen in the ER for abdominal pain. You left AGAINST MEDICAL ADVICE before the full workup could be done. You are welcome and encouraged to return at any time should you wish to have further evaluation. Please do not hesitate to immediately return or go to the nearest ER.    Please return to the emergency department immediately if you experience fever > 100.4 F, worsening or uncontrolled pain, loss of consciousness, feeling like you will faint, headache, chest pain, difficulty breathing, abdominal pain, vomiting, blood in your stool or urine, severe bleeding, difficulty moving your legs or arms, or any other concerning or worsening symptoms.

## 2023-07-22 NOTE — ED Notes (Signed)
 Pt left  with steady gait     Jorene New, RN  07/22/23 2110

## 2023-07-22 NOTE — Unmapped (Signed)
 EMERGENCY DEPARTMENT ENCOUNTER    Physician Assistant Note    Date of service: 07/22/2023  5:55 AM    HISTORY OF PRESENT ILLNESS:    53 y.o. male with a history Afib on Xarelto who presents with RLQ abd pain x1h, constant, prior similar "many years ago", no history of abd surgery. PT states this feels better when he presses on the area. PT denies fevers or chills.    INDEPENDENT HISTORIAN    History provided by:  PT, chart  History limited by:  none  Language interpreter used: no    PAST MEDICAL HISTORY / PROBLEM LIST    Medical History[1]    Problem List[2]    PAST SURGICAL HISTORY    Surgical History[3]    MEDICATIONS     Previous Medications    ASPIRIN 81 MG CHEWABLE TABLET    Chew 81 mg.    ATORVASTATIN (LIPITOR) 20 MG TABLET    Take 20 mg by mouth.    LISINOPRIL 20 MG TABLET    Take 20 mg by mouth.    METOPROLOL TARTRATE (LOPRESSOR) 100 MG TABLET    Take 1 tablet (100 mg) by mouth twice daily for 10 days.    NITROGLYCERIN (NITROSTAT) 0.4 MG SL TABLET    Place 0.4 mg under the tongue every 5 (five) minutes if needed.    RIVAROXABAN (XARELTO) 20 MG TABLET    Take 1 tablet (20 mg) by mouth in the morning for 10 days.        ALLERGIES    Allergies[4]    SOCIAL HISTORY    Social History     Tobacco Use    Smoking status: Every Day     Types: Cigarettes    Smokeless tobacco: Never   Substance Use Topics    Alcohol use: Not on file     Social History     Substance and Sexual Activity   Drug Use Not on file       FAMILY HISTORY    Family History[5]    TRIAGE VITALS    ED Triage Vitals [07/22/23 0530]   Temp Pulse Resp BP   36.4 C (97.5 F) 89 20 122/81      SpO2 Temp Source Heart Rate Source Patient Position   99 % Oral Monitor Sitting      BP Location FiO2 (%)     Left arm --            REVIEW OF SYSTEMS  A pertinent review of systems was obtained and is positive or negative as indicated in the HPI.      PHYSICAL EXAM    ED Triage Vitals [07/22/23 0530]   Temp Pulse Resp BP   36.4 C (97.5 F) 89 20 122/81       SpO2 Temp Source Heart Rate Source Patient Position   99 % Oral Monitor Sitting      BP Location FiO2 (%)     Left arm --          General: Patient is awake and alert and non-toxic appearing.      Cardiac: Normal S1S2, no LE edema, no murmurs/rubs/gallops.    Chest: No deformities. Equal chest rise and fall.    Respiratory: Respiratory effort is normal, no respiratory distress, lungs clear to auscultation bilaterally.    Abdomen: Appears flat, soft, RLQ TTP.    MEDICAL DECISION MAKING    Nursing notes reviewed and I agree.    ACUTE CONDITIONS  Initial  differential diagnosis: appendicitis, SBO, diverticulitis    CHRONIC CONDITIONS  afib    EXTERNAL RECORDS AND EXTERNAL DATA REVIEWED  Prior notes OSH, inpatient or outpatient, which were available were reviewed regarding patient's medical history in order to obtain information regarding the patient's history and up-to-date medication list. External run sheets reviewed from EMS or outside facilities if available in order to inform ED course     TESTS AND WORKUP CONSIDERED  Labs, imaging    TREATMENT CONSIDERED  analgesia    Relevant Scores  Glasgow Coma Scale Score: 15      INDEPENDENT INTERPRETATIONS      LABS      Labs Reviewed   CBC WITH DIFFERENTIAL - Abnormal       Result Value    WBC 5.3      RBC 4.72      Hemoglobin 13.4      Hematocrit 40.8      MCV 86.4      MCH 28.4      MCHC 32.8      RDW-CV 14.5      RDW-SD 46.3      Platelets 294      MPV 8.7 (*)     Neutrophil % 64.9      Lymphocyte % 20.6      Monocytes % 10.5      Eosinophils % 3.0      Basophils % 0.6      Immature Granulocytes % 0.4      NRBC % 0.0      Neutrophils Absolute 3.46      Lymphocytes Absolute 1.10      Monocytes Absolute 0.56      Eosinophils Absolute 0.16      Basophils Absolute 0.03      Immature Granulocytes Absolute 0.02      NRBC Absolute 0.00     LIPASE - Normal    Lipase 23     SDS, SERUM DRUG SCREEN - Normal    Acetaminophen level <5      Ethanol level, plasma/serum <10       Salicylate level <0.5      Benzodiazepines screen, serum Not Detected      Tricyclics screen, serum Not Detected     CBC W/DIFF    Narrative:     The following orders were created for panel order CBC W/DIFF.  Procedure                               Abnormality         Status                     ---------                               -----------         ------                     CBC w/ Differential[[id number]]          Abnormal            Final result                 Please view results for these tests on the individual orders.   COMPREHENSIVE METABOLIC PANEL    Sodium 138  Potassium 4.4      Chloride 105      CO2 (Bicarbonate) 22      Anion Gap 11      BUN 11      Creatinine 0.69      eGFRcr 112      Glucose 98      Fasting? Unknown      Calcium 8.6      AST 31      ALT 22      Alkaline phosphatase 115      Protein, total 6.5      Albumin 4.2      Bilirubin, total 0.3     URINE TESTING (UA, UARC, URINE CULTURE)    Narrative:     The following orders were created for panel order Urine Testing (UA, UARC, Urine Culture).  Procedure                               Abnormality         Status                     ---------                               -----------         ------                     Urinalysis reflex to cul.Aaron AasAaron Aas[[id number]]                                                   Please view results for these tests on the individual orders.   URINALYSIS REFLEX TO CULTURE   URINE DRUG SCREEN        RADIOLOGY  If applicable I have visualized, examined, and interpreted any of the patient's images and I agree with the radiologists report: Yes    CT ABDOMEN PELVIS W CONTRAST    (Results Pending)          DISCUSSION OF MANAGEMENT OR TEST INTERPRETATION WITH OTHER PROVIDERS INCLUDING CONSULTS  NA    HOSPITALIZATION CONSIDERED  TBD    SOCIAL DETERMINATES OF HEALTH  Social determinants significantly affecting care include inadequate housing, low income, alcoholism/drug addiction, problems related to primary support group,  unemployment, problems rate related to unemployment, dementia/memory issues which have been reviewed.          MY WORKING CLINICAL IMPRESSION / DIAGNOSIS:  Diagnoses as of 07/22/23 0904   Abdominal pain, unspecified abdominal location     Medications - No data to display        I have re-evaluated the patient: PT wished to leave prior to the CT scan, he states he thinks he slept on his hip which is the cause of the pain.  I advised the patient that I could not assure him that his abdominal pain was only from sleeping wrong and that I encouraged him to stay for the CT scan however indicated that he did not wish to go and signed AMA form.  I advised the patient that he can immediately return at any moment should he wish to continue the workup to which he articulated understanding.  ED Prescriptions    None         Patient agreeable to plan.    Chart generated using dictation software, please be aware of "sound-a-like" terms and please excuse any typos!       [1]   Past Medical History:  Diagnosis Date    Hypertension      MI (mitral incompetence)     Stroke (Multi-HCC)    [2] There is no problem list on file for this patient.   [3] History reviewed. No pertinent surgical history.  [4]   Allergies  Allergen Reactions    Ketorolac Hives, Rash, Shortness of breath and Unknown     Other reaction(s): GI Discomfort  Vomit  Vomit      Tramadol Hives, Itching, Rash and Anaphylaxis    Benadryl [Diphenhydramine Hcl]      IV benadryl; can tolerate oral    Reglan [Metoclopramide Hcl]    [5] No family history on file.       Marcus Sewer [place], [place]  07/22/23 [phone]

## 2023-07-22 NOTE — ED Triage Notes (Signed)
 Pt to ED c/o RLQ abdominal pain, pt was seen here this morning for the same complaint and left AMA. Pt reports constant pain that's worsening. +Nausea +Vomiting.

## 2023-07-22 NOTE — Discharge Instructions (Signed)
 Today you were treated in the Emergency Department and your physical exam and vital signs were reassuring. You decided to leave AGAINST MEDICAL ADVICE.

## 2023-07-22 NOTE — ED Provider Notes (Signed)
 EMERGENCY DEPARTMENT ENCOUNTER    ATTENDING NOTE    Date of service: 07/22/2023  8:45 PM  Triage note reviewed, please see HPI for definitive presentation details.      HISTORY OF PRESENT ILLNESS:    Patient is a 53 year old male, with a history of atrial fibrillation on Xarelto, who presents today with right lower quadrant pain.  Patient states he has had the pain since this morning at approximately 6 AM. Upon our discussion he states: "I don't want to waste anyone's time, I am going to go." Denies ETOH or drug use.     Of note on my independent chart review patient was seen here this morning at approximately 6 AM and had a reassuring workup.  Patient had no elevated white blood cell count, hemoglobin 13.4, no electrolyte abnormalities, creatinine was 0.69.  Patient left AMA prior to CT scan.  Patient was concerned that maybe his pain was from the hip after he slept on it.      ROS  Please see HPI      PAST MEDICAL HISTORY / PROBLEM LIST  Medical History[1]  PAST SURGICAL HISTORY  Surgical History[2]  MEDICATIONS   Previous Medications    ASPIRIN 81 MG CHEWABLE TABLET    Chew 81 mg.    ATORVASTATIN (LIPITOR) 20 MG TABLET    Take 20 mg by mouth.    LISINOPRIL 20 MG TABLET    Take 20 mg by mouth.    METOPROLOL TARTRATE (LOPRESSOR) 100 MG TABLET    Take 1 tablet (100 mg) by mouth twice daily for 10 days.    NITROGLYCERIN (NITROSTAT) 0.4 MG SL TABLET    Place 0.4 mg under the tongue every 5 (five) minutes if needed.    RIVAROXABAN (XARELTO) 20 MG TABLET    Take 1 tablet (20 mg) by mouth in the morning for 10 days.      ALLERGIES  Allergies[3]  SOCIAL HISTORY  Social History     Tobacco Use    Smoking status: Every Day     Types: Cigarettes    Smokeless tobacco: Never   Substance Use Topics    Alcohol use: Not on file     Social History     Substance and Sexual Activity   Drug Use Not on file     FAMILY HISTORY  Family History[4]  Family history reviewed and non-contributory to the current presentation.       PHYSICAL  EXAM    Visit Vitals  BP 101/65 (BP Location: Left arm, Patient Position: Sitting)   Pulse 104   Temp 36.3 C (97.4 F) (Temporal)   Resp 20      Gen:  Comfortable  Eyes: WNL  HEENT: MMM, head atraumatic. Neck is supple with normal ROM.  CV: No JVD  Pulmonary: Normal work of breathing  Abd: No distension  MSK: Normal ROM.   Neuro: No neuro deficits  Skin: Skin is warm and dry. No rash or wounds noted.  Psych:   Resting comfortably    MEDICAL DECISION MAKING & ED COURSE      Patient is a 53 year old male, with a history of atrial fibrillation on Xarelto, who presents today with right lower quadrant pain.  Patient states he has had the pain since this morning at approximately 6 AM. Upon our discussion he states: "I don't want to waste anyone's time, I am going to go." Denies ETOH or drug use.     Of note on my independent chart review  patient was seen here this morning at approximately 6 AM and had a reassuring workup.  Patient had no elevated white blood cell count, hemoglobin 13.4, no electrolyte abnormalities, creatinine was 0.69.  Patient left AMA prior to CT scan.  Patient was concerned that maybe his pain was from the hip after he slept on it.      ED Course as of 07/22/23 2108   Wed Jul 22, 2023   2108 Patient has decided to leave Against Medical Advice [facility]).  I personally discussed risk of leaving with patient. They stated they understand there is a risk of their condition getting worse which could lead to disability and/or death, and repeated risks back to me.  We discussed their reasons for wanting to leave, and I tried to address these reasons with them and provide possible solutions to encourage them to remain in the hospital.  We discussed their lab/imaging results up until this point in their treatment and my concerns and differential.  However, they still chose to leave. They appear awake and capable of making their own decisions. They are able to state back to me and/pr care team what their treatment  options are, the consequences of their decision and the possible risks of leaving.  They still feel, however, that they do not want to stay for further treatment. They were told they can return to the ED at any time to resume evaluation and treatment if they so choose and we discharged reasons to return.  Attempted to have patient sign an AMA form.             Diagnoses as of 07/22/23 2108   Right lower quadrant abdominal pain     Eloped before receiving paperwork.           EXTERNAL RECORDS REVIEWED  Prior notes OSH, inpatient or outpatient, which were available were reviewed regarding patient's medical history in order to obtain information regarding the patient's history and up-to-date medication list.   TESTS/TREATMENT/HOSPITALIZATION CONSIDERED  All labs and imaging independently reviewed by myself and ED Course and my note can be reviewed for interpretations.    CHRONIC CONDITIONS  Past medical history as documented in HPI and EMR.  DISCUSSION WITH OTHER PROVIDERS  Nursing, ED Tech  SOCIAL DETERMINANTS OF HEALTH  Social determinants significantly affecting care include inadequate housing, low income, alcoholism/drug addiction, problems related to primary support group, unemployment, problems rate related to unemployment  INFORMANTS  Patient    CLINICAL IMPRESSION:  1. Right lower quadrant abdominal pain             [1]   Past Medical History:  Diagnosis Date    Hypertension      MI (mitral incompetence)     Stroke (Multi-HCC)    [2] History reviewed. No pertinent surgical history.  [3]   Allergies  Allergen Reactions    Ketorolac Hives, Rash, Shortness of breath and Unknown     Other reaction(s): GI Discomfort  Vomit  Vomit      Tramadol Hives, Itching, Rash and Anaphylaxis    Benadryl [Diphenhydramine Hcl]      IV benadryl; can tolerate oral    Reglan [Metoclopramide Hcl]    [4] No family history on file.       Arcelia Knudsen, MD  07/23/23 [phone]

## 2023-07-22 NOTE — ED Triage Notes (Signed)
 Pt reports to the ED for lower right abdominal pain that stared 20 minutes ago. Pt reports pain improves with pressure. Pt reports nausea.

## 2023-07-22 NOTE — ED Notes (Signed)
 Pt refused medication and labs and stated it was a mistake to be here, Im just having discomfort but it will go away, MD aware.     Jorene New, RN  07/22/23 2108

## 2023-07-22 NOTE — ED Notes (Signed)
 Patient is A&Ox3, respirations even and unlabored, no acute distress noted at this time. Patient presents today for abdominal pain that started 20 PTA, + nausea. Patient denies symptoms now, stating that they have improved. Patient is requesting to leave. MD made aware.      [place], [place]  07/22/23 [phone]

## 2023-07-22 NOTE — ED Notes (Addendum)
 PA Stone at bedside to sign patient out AMA. Patient refusing vital signs. Patient removed own IV line; bleeding controlled; gauze and tape applied. Patient declining AVS. Patient ambulatory out of ED with steady gait.      Leonore Ran, RN  07/22/23 0904       Leonore Ran, RN  07/22/23 [phone]

## 2023-07-22 NOTE — ED Provider Notes (Signed)
 [facility] EMERGENCY DEPARTMENT   [address]  [place], [place], [place] [address]         HPI   Chief Complaint   Patient presents with    Abdominal Pain        HPI  This is a 53 year old who has a history of atrial fibrillation, on Xarelto, who presents for evaluation of right lower quadrant abdominal pain.  The patient reports that symptoms began approximately 1 hour ago and noticed that when he woke up: Constant achy right lower quadrant abdominal pain without radiation.  No flank pain chest pain hematuria dysuria diarrhea, constipation, chest pain shortness of breath or rash.  No skin discoloration.  He does believe that he had similar symptoms, "many years ago,", without understanding the underlying etiology of the prior episode.  Patient reports that his symptoms feel better when he pushes on the area.    SOCIAL HISTORY: Uses marijuana  Patient History   Medical History[1]  Surgical History[2]  Family History[3]  Social History     Tobacco Use    Smoking status: Every Day     Types: Cigarettes    Smokeless tobacco: Never   Substance Use Topics    Alcohol use: Never    Drug use: Yes     Types: Marijuana       Review of Systems   Review of Systems  Please refer to the physician assistants or residents notes, or as otherwise documented above in the history of present illness    Physical Exam   ED Triage Vitals [07/22/23 0530]   Temp Pulse Resp BP   36.4 C (97.5 F) 89 20 122/81      SpO2 Temp Source Heart Rate Source Patient Position   99 % Oral Monitor Sitting      BP Location FiO2 (%)     Left arm --       Physical Exam  Constitution: Awake alert well-appearing nontoxic.  Head atraumatic.  Eyes without icterus.  Skin is warm dry without rash or petechiae jaundice.  Back no CVA tenderness.  Neck without lymphadenopathy or jugular venous tension.  Lungs are clear and equal bilateral without tachypnea rales rhonchi or wheezes.  No accessory muscle use.  Heart regular rate and rhythm murmur.  Abdomen  soft, with minimal left lower quadrant abdominal tenderness, no guarding or peritonitis no organomegaly no pulsatile mass no tenderness to McBurney's point, no Murphy sign.  Skin is without edema asymmetry or calf tenderness.  Neuro: Awake and with a GCS of 15 normal speech steady gait.  No orders to display       Labs Reviewed   CBC WITH DIFFERENTIAL - Abnormal       Result Value    WBC 5.3      RBC 4.72      Hemoglobin 13.4      Hematocrit 40.8      MCV 86.4      MCH 28.4      MCHC 32.8      RDW-CV 14.5      RDW-SD 46.3      Platelets 294      MPV 8.7 (*)     Neutrophil % 64.9      Lymphocyte % 20.6      Monocytes % 10.5      Eosinophils % 3.0      Basophils % 0.6      Immature Granulocytes % 0.4      NRBC % 0.0  Neutrophils Absolute 3.46      Lymphocytes Absolute 1.10      Monocytes Absolute 0.56      Eosinophils Absolute 0.16      Basophils Absolute 0.03      Immature Granulocytes Absolute 0.02      NRBC Absolute 0.00     LIPASE - Normal    Lipase 23     SDS, SERUM DRUG SCREEN - Normal    Acetaminophen level <5      Ethanol level, plasma/serum <10      Salicylate level <0.5      Benzodiazepines screen, serum Not Detected      Tricyclics screen, serum Not Detected     CBC W/DIFF    Narrative:     The following orders were created for panel order CBC W/DIFF.  Procedure                               Abnormality         Status                     ---------                               -----------         ------                     CBC w/ Differential[[id number]]          Abnormal            Final result                 Please view results for these tests on the individual orders.   COMPREHENSIVE METABOLIC PANEL    Sodium 138      Potassium 4.4      Chloride 105      CO2 (Bicarbonate) 22      Anion Gap 11      BUN 11      Creatinine 0.69      eGFRcr 112      Glucose 98      Fasting? Unknown      Calcium 8.6      AST 31      ALT 22      Alkaline phosphatase 115      Protein, total 6.5      Albumin 4.2       Bilirubin, total 0.3              ED Course & MDM   Diagnoses as of 08/10/23 1231   Abdominal pain, unspecified abdominal location       Medical Decision Making  Problems Addressed:  Abdominal pain, unspecified abdominal location: complicated acute illness or injury    Amount and/or Complexity of Data Reviewed  Labs: ordered. Decision-making details documented in ED Course.    Risk  Diagnosis or treatment significantly limited by social determinants of health.        This is a current well-appearing nontoxic afebrile 53 year old who is vital signs normal presents for evaluation of right lower quadrant abdominal pain.  Differential diagnose includes gastroenteritis, although this is likely this likely because there is no nausea vomiting or diarrhea.  No evidence of renal or testicular pathology.  Differential diagnose includes appendicitis, diverticulitis, or other surgical intra-abdominal pathology.  Pulsoxymeter  is visualized and interpreted by me he has room air ox saturation 98 to 100%.  The cardiac monitor is visualized and interpreted by me is normal sinus rhythm rate 80s 90s without dysrhythmia.  Labs are normal.  Given concern for surgical intra-abdominal pathology recommended a CT scan to further evaluate.  Unfortunate, the patient states that he is feeling improved, and he declines a CT scan.  We advised him that he might have an infectious or surgical pathology that, if not diagnosed or treated early, could lead to worsening pain, chronic disability, he understands these concerns and leaves AGAINST MEDICAL ADVICE.    DIAGNOSIS: Abdominal pain                        [1]   Past Medical History:  Diagnosis Date    Hypertension      MI (mitral incompetence)     Stroke (Multi-HCC)    [2] History reviewed. No pertinent surgical history.  [3] No family history on file.       Billie Intriago B. Erin Uecker, MD  08/10/23 1243

## 2023-07-23 MED FILL — ACETAMINOPHEN 325 MG TABLET: 325 325 mg | ORAL | Qty: 2 | Fill #0

## 2023-07-24 LAB — BMP (EXT)
Anion Gap (EXT): 13 (ref 5–15)
BUN (EXT): 13 mg/dL (ref 7–23)
CO2 (EXT): 20 mmol/L — ABNORMAL LOW (ref 22–32)
CalciumCalcium (EXT): 9 mg/dL (ref 8.6–10.5)
Chloride (EXT): 103 mmol/L (ref 98–107)
Creatinine (EXT): 1 mg/dL (ref 0.60–1.30)
Glucose (EXT): 94 mg/dL (ref 65–99)
Potassium (EXT): 4.2 mmol/L (ref 3.5–5.3)
Sodium (EXT): 136 mmol/L (ref 135–145)
eGFR - Creat CKD-EPI (EXT): >90 mL/min/{1.73_m2} (ref >=60)

## 2023-07-25 LAB — BMP (EXT)
Anion Gap (EXT): 12 (ref 5–15)
BUN (EXT): 11 mg/dL (ref 7–23)
CO2 (EXT): 22 mmol/L (ref 22–32)
CalciumCalcium (EXT): 9.1 mg/dL (ref 8.6–10.5)
Chloride (EXT): 103 mmol/L (ref 98–107)
Creatinine (EXT): 0.83 mg/dL (ref 0.60–1.30)
Glucose (EXT): 150 mg/dL — ABNORMAL HIGH (ref 65–99)
Potassium (EXT): 3.9 mmol/L (ref 3.5–5.3)
Sodium (EXT): 137 mmol/L (ref 135–145)
eGFR - Creat CKD-EPI (EXT): >90 mL/min/{1.73_m2} (ref >=60)

## 2023-07-26 LAB — BMP (EXT)
Anion Gap (EXT): 13 mmol/L (ref 3–17)
BUN (EXT): 12 mg/dL (ref 9–23)
CO2 (EXT): 22 mmol/L (ref 20–31)
CalciumCalcium (EXT): 9.1 mg/dL (ref 8.7–10.4)
Chloride (EXT): 103 mmol/L (ref 95–106)
Creatinine (EXT): 0.61 mg/dL (ref 0.50–1.30)
GFR Estimated (Calc) (EXT): 116 mL/min/{1.73_m2} (ref >60)
Glucose (EXT): 95 mg/dL (ref 74–106)
Potassium (EXT): 4.1 mmol/L (ref 3.5–5.2)
Sodium (EXT): 138 mmol/L (ref 136–145)

## 2023-07-28 ENCOUNTER — Inpatient Hospital Stay: Admit: 2023-07-28 | Discharge: 2023-07-28 | Discharge status: Against Medical Advice

## 2023-07-28 DIAGNOSIS — R112 Nausea with vomiting, unspecified: Secondary | ICD-10-CM

## 2023-07-28 NOTE — ED Triage Notes (Signed)
 53 yo M presenting to ED with c/o mid abd pain and 2 episodes of vomiting since 05:45 this morning. Denies fevers, diarrhea

## 2023-07-28 NOTE — ED Notes (Signed)
 Pt informed staff of departure, LWBS.     Isaias March, RN  07/28/23 [phone]

## 2023-07-28 NOTE — ED Triage Notes (Signed)
 Formatting of this note might be different from the original.  53 yo M presenting to ED with c/o mid abd pain and 2 episodes of vomiting since 05:45 this morning. Denies fevers, diarrhea   Electronically signed by Theodosia Fishman, RN at 07/28/2023  6:15 AM EDT

## 2023-07-28 NOTE — ED Notes (Signed)
 Formatting of this note might be different from the original.  Pt informed staff of departure, LWBS.    Isaias March, RN  07/28/23 [phone]    Electronically signed by Isaias March, RN at 07/28/2023  7:55 AM EDT

## 2023-08-05 LAB — EGFR (EXT)
eGFR - Creat CKD-EPI (EXT): 112 mL/min/{1.73_m2} (ref >=60)
eGFR - Creat CKD-EPI (EXT): 107 mL/min/{1.73_m2} (ref >=60)

## 2023-08-05 LAB — UNMAPPED LAB RESULTS: Creatinine (EXT): 0.8 mg/dL (ref 0.8–1.3)

## 2023-08-05 LAB — BMP (EXT)
Anion Gap (EXT): 8 (ref 3–12)
BUN (EXT): 11 mg/dL (ref 8–20)
CO2 (EXT): 27 mmol/L (ref 22–32)
CalciumCalcium (EXT): 9.7 mg/dL (ref 8.9–10.3)
Chloride (EXT): 104 mmol/L (ref 101–111)
Creatinine (EXT): 0.7 mg/dL (ref 0.64–1.27)
Glucose (EXT): 96 mg/dL (ref 70–99)
Potassium (EXT): 4.6 mmol/L (ref 3.6–5.1)
Sodium (EXT): 139 mmol/L (ref 136–144)

## 2023-08-05 NOTE — ED Provider Notes (Signed)
 Emergency Department Sign Out Note       Authentication   I saw and evaluated [name] [name] primarily.      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    Multiple recent admissions at [place], [place], cooper.    Concern undomiciled.    Plan for obs, stress, echo.    No narcotics.    Since assuming care, course includes:      ED Course as of 08/06/23 1238   Others' Documentation   Wed Aug 05, 2023   1039 Electrocardiogram  Normal sinus rhythm at 72 beats per minute, no ST elevation, QTC 413. [NJ]   1159 Troponin T, High Sensitivity (HS TNT): 7 [NJ]   1200 Labs grossly benign, we will trend troponin. [NJ]   1259 Troponin T, High Sensitivity (HS TNT): 8 [NJ]   1335 U THC Scrn(!): Positive [NJ]   1335 ETHANOL: <10 [NJ]   1410 CT Head wo IV Contrast     1. No acute intracranial hemorrhage.   2. No fracture or malalignment of the cervical spine.  3. Radiopaque linear density seen within the right external auditory canal, concern for foreign body. Recommend direct visualization.   [NJ]   1453 CT Abdomen/Pelvis w IV Contrast  No acute abnormality in the abdomen or pelvis.     ATTENDING PHYSICIAN ATTESTATION [ATT05]: I have personally reviewed the images and agree with this report.   [NJ]   1618 D2D complete [NJ]      ED Course User Index  [NJ] Jabco, Mabel Cornet, PA-C         Clinical Impression/Disposition Plan   Chest pain  undomiciled      Disposition   Observation

## 2023-08-05 NOTE — ED Provider Notes (Signed)
 Emergency Department Provider Note       Authentication   I saw and evaluated [name] [name] with Attending Physician, Dr. Maryelizabeth Ferries.  Below is my history, physical exam, and medical decision making, except as may have been edited to reflect the attending's additions, exceptions, and observations.    Mabel Cornet [place], PA-C  08/05/2023 10:43 AM  XX  Chief Complaint   Chest Pain      History of Present Illness   The history is provided by the patient and medical records. No language interpreter was used.   Chest Pain     53 year old male with AFib on Xarelto, schizophrenia, CAD with MI x3 (no reported stents), previous CVA/TIA with residual side effects and frequent ED visits outside hospital as presents to the emergency department with complaints of exertional chest pain and syncope that occurred earlier this morning when walking from the train station    Patient states that as he was walking he felt a squeeze in the center of the chest, and then reports I woke up to [place] police.     Patient states that since the fall, he has had a generalized right-sided headache and dizziness.  Reports that he smokes marijuana daily.    Denies AMS, slurring of the speech, weakness of the arms or legs, paresthesia of the arms or legs, vision changes, radiation of pain to the back, shortness of breath, pleuritic chest pain, history of DVT or PE, hemoptysis, additional drug use, fever, chills, recent immobilization, recent chemo or radiation therapy, vision loss.         Review of Systems   Review of Systems   Cardiovascular:  Positive for chest pain.       Physical Exam   Physical Exam  Vitals and nursing note reviewed.   Constitutional:       General: He is not in acute distress.     Appearance: He is well-developed and normal weight. He is not ill-appearing or toxic-appearing.   HENT:      Head: Normocephalic and atraumatic. No raccoon eyes, Battle's sign, contusion, right periorbital erythema, left periorbital erythema  or laceration.      Jaw: There is normal jaw occlusion.      Right Ear: No hemotympanum.      Left Ear: No hemotympanum.      Nose: Nose normal.      Comments: No evidence of external trauma.  [place] CT head score 0.       Mouth/Throat:      Lips: Pink.      Pharynx: Oropharynx is clear. Uvula midline.   Eyes:      Extraocular Movements: Extraocular movements intact.      Pupils: Pupils are equal, round, and reactive to light.   Cardiovascular:      Rate and Rhythm: Normal rate and regular rhythm.      Heart sounds: Normal heart sounds.   Pulmonary:      Effort: Pulmonary effort is normal.      Breath sounds: No decreased breath sounds.   Musculoskeletal:         General: Normal range of motion.      Right lower leg: No edema.      Left lower leg: No edema.   Skin:     General: Skin is warm and dry.   Neurological:      General: No focal deficit present.      Mental Status: He is alert and oriented to person, place,  and time. Mental status is at baseline.      GCS: GCS eye subscore is 4. GCS verbal subscore is 5. GCS motor subscore is 6.      Cranial Nerves: Cranial nerves 2-12 are intact.      Sensory: Sensation is intact.      Motor: Motor function is intact.      Coordination: Coordination is intact.      Gait: Gait is intact.      Comments: 5/5 strength in bilateral lower extremities, patient reports dizziness with standing, but nonfocal neuro exam otherwise.   Psychiatric:         Mood and Affect: Mood normal. Mood is not anxious.         Behavior: Behavior normal.         Medical Decision Making       [name] [name] is a 53 y.o. male with AFib on Xarelto, schizophrenia, CAD with MI x3 (no reported stents), previous CVA/TIA with residual side effects and frequent ED visits to outside hospitals with similar presentations, presents to the emergency department with complaints of exertional chest pain and syncope that occurred earlier this morning when walking from the train station.     Vitals stable, afebrile,  overall well-appearing 53 year old male in no acute distress.  Physical exam grossly benign.    Differential diagnoses include, but are not limited to, ACS, cardiac arrhythmia, orthostatic hypotension, malingering, PE, CVA, ICH, intracranial fx > dissection, sepsis     Wells criteria for PE 0, unlikely.  Troponin flat, EKG nonischemic, no evidence of cardiac arrhythmia on monitor, unlikely acute myocardial infarction or cardiac arrhythmia.  Nonfocal neuro exam, bilateral radial and DP and PT pulses 2+, no significant radiation of pain in the abdomen, flank, back, low suspicion for dissection.  CTs of the chest and abdomen grossly unremarkable, with low suspicion for dissection based on HPI, unlikely with reassuring imaging.  No leukocytosis, afebrile and overall well-appearing, unlikely syncope secondary to sepsis.    Nonfocal neuro exam, no evidence of external trauma, no C-spine tenderness, CT of the head and neck grossly unremarkable, unlikely acute intracranial fracture or intracranial hemorrhage.  With a nonfocal neuro exam, unlikely CVA.    Unable to review any outpatient echo or stress testing, we will plan to keep the patient in the observation unit for echo and stress test.    I reviewed external records:     [place].  Multiple times he has left AMA.     07/28/2023 emergency medicine note - patient presented for sudden onset right-sided flank pain radiating to his abdomen.  Labs reported as grossly benign, CTAP unremarkable.  Diagnosed with constipation and discharged home.    07/25/2023 discharge summary - patient presented with chest pain, left AMA.     See Orders and/or ED Course for ordered tests and medications.    The following tests were independently interpreted by Emergency Medicine provider(s):     EKG - normal sinus rhythm, no ST-elevation  Chest x-ray - no acute pathology.      I have discussed the management of this  patient and their diagnostic tests with the following external providers: None           The below listed factors contribute to the complexity of the management of the patient:  Health related social needs, which affects the patient's ability to obtain follow-up care after discharge and/or requires repeat monitoring  by medical staff: Housing or food instability  Past medical history/chronic conditions, which affect(s) patient recovery: AFib on Xarelto, schizophrenia, CAD with MI x3 (no reported stents), previous CVA/TIA with residual side effects    ED Course and Reassessments     ED Course as of 08/06/23 0836   Others' Documentation   Wed Aug 05, 2023   1039 Electrocardiogram  Normal sinus rhythm at 72 beats per minute, no ST elevation, QTC 413. [NJ]   1159 Troponin T, High Sensitivity (HS TNT): 7 [NJ]   1200 Labs grossly benign, we will trend troponin. [NJ]   1259 Troponin T, High Sensitivity (HS TNT): 8 [NJ]   1335 U THC Scrn(!): Positive [NJ]   1335 ETHANOL: <10 [NJ]   1410 CT Head wo IV Contrast     1. No acute intracranial hemorrhage.   2. No fracture or malalignment of the cervical spine.  3. Radiopaque linear density seen within the right external auditory canal, concern for foreign body. Recommend direct visualization.   [NJ]   1453 CT Abdomen/Pelvis w IV Contrast  No acute abnormality in the abdomen or pelvis.     ATTENDING PHYSICIAN ATTESTATION [[id number]]: I have personally reviewed the images and agree with this report.   [NJ]   1618 D2D complete [NJ]      ED Course User Index  [NJ] Jabco, Mabel Cornet, PA-C       Clinical Impression and Plan     # syncope    # Chest pain    Disposition   Pt signed out to dr bar and team at 3p pending d2d obs       Attestation and Authentication   I saw and evaluated [name] [name] and reviewed the above APP's notes. I personally performed the substantive portion of the Medical Decision Making.   ,      Issac Maryelizabeth BRAVO, MD  08/06/23 [phone]

## 2023-08-06 ENCOUNTER — Emergency Department: Payer: Self-pay

## 2023-08-06 ENCOUNTER — Observation Stay
Admission: EM | Admit: 2023-08-06 | Discharge: 2023-08-07 | Discharge status: Against Medical Advice | Payer: Self-pay | Attending: Internal Medicine | Admitting: Internal Medicine

## 2023-08-06 DIAGNOSIS — I4891 Unspecified atrial fibrillation: Secondary | ICD-10-CM | POA: Insufficient documentation

## 2023-08-06 DIAGNOSIS — J45909 Unspecified asthma, uncomplicated: Secondary | ICD-10-CM | POA: Insufficient documentation

## 2023-08-06 DIAGNOSIS — I10 Essential (primary) hypertension: Secondary | ICD-10-CM | POA: Insufficient documentation

## 2023-08-06 DIAGNOSIS — Z7901 Long term (current) use of anticoagulants: Secondary | ICD-10-CM | POA: Insufficient documentation

## 2023-08-06 DIAGNOSIS — I2510 Atherosclerotic heart disease of native coronary artery without angina pectoris: Secondary | ICD-10-CM | POA: Insufficient documentation

## 2023-08-06 DIAGNOSIS — Z79899 Other long term (current) drug therapy: Secondary | ICD-10-CM | POA: Insufficient documentation

## 2023-08-06 DIAGNOSIS — R079 Chest pain, unspecified: Secondary | ICD-10-CM | POA: Insufficient documentation

## 2023-08-06 DIAGNOSIS — E663 Overweight: Secondary | ICD-10-CM | POA: Insufficient documentation

## 2023-08-06 DIAGNOSIS — Z6825 Body mass index (BMI) 25.0-25.9, adult: Secondary | ICD-10-CM | POA: Insufficient documentation

## 2023-08-06 DIAGNOSIS — F17290 Nicotine dependence, other tobacco product, uncomplicated: Secondary | ICD-10-CM | POA: Insufficient documentation

## 2023-08-06 DIAGNOSIS — R55 Syncope and collapse: Principal | ICD-10-CM | POA: Insufficient documentation

## 2023-08-06 DIAGNOSIS — F209 Schizophrenia, unspecified: Secondary | ICD-10-CM | POA: Insufficient documentation

## 2023-08-06 DIAGNOSIS — F17210 Nicotine dependence, cigarettes, uncomplicated: Secondary | ICD-10-CM | POA: Insufficient documentation

## 2023-08-06 HISTORY — DX: Unspecified asthma, uncomplicated: J45.909

## 2023-08-06 LAB — COMPREHENSIVE METABOLIC PANEL
ALT: 42 U/L (ref <=55)
AST (SGOT): 27 U/L (ref <=41)
Albumin/Globulin Ratio: 1.6 (ref 0.9–2.2)
Albumin: 4.2 g/dL (ref 3.5–5.0)
Alkaline Phosphatase: 126 U/L — ABNORMAL HIGH (ref 37–117)
Anion Gap: 10 (ref 5.0–15.0)
BUN: 17 mg/dL (ref 9–28)
Bilirubin, Total: 0.2 mg/dL (ref 0.2–1.2)
CO2: 22 meq/L (ref 17–29)
Calcium: 9 mg/dL (ref 8.5–10.5)
Chloride: 109 meq/L (ref 99–111)
Creatinine: 0.9 mg/dL (ref 0.5–1.5)
GFR: >60 mL/min/1.73 m2 (ref >=60.0)
Globulin: 2.7 g/dL (ref 2.0–3.6)
Glucose: 106 mg/dL — ABNORMAL HIGH (ref 70–100)
Potassium: 4.1 meq/L (ref 3.5–5.3)
Protein, Total: 6.9 g/dL (ref 6.0–8.3)
Sodium: 141 meq/L (ref 135–145)

## 2023-08-06 LAB — LAB USE ONLY - CBC WITH DIFFERENTIAL
Absolute Basophils: 0.03 x10 3/uL (ref 0.00–0.08)
Absolute Eosinophils: 0.13 x10 3/uL (ref 0.00–0.44)
Absolute Immature Granulocytes: 0.02 x10 3/uL (ref 0.00–0.07)
Absolute Lymphocytes: 1.47 x10 3/uL (ref 0.42–3.22)
Absolute Monocytes: 0.6 x10 3/uL (ref 0.21–0.85)
Absolute Neutrophils: 3.97 x10 3/uL (ref 1.10–6.33)
Absolute nRBC: 0 x10 3/uL (ref <=0.00)
Basophils %: 0.5 %
Eosinophils %: 2.1 %
Hematocrit: 39 % (ref 37.6–49.6)
Hemoglobin: 13.1 g/dL (ref 12.5–17.1)
Immature Granulocytes %: 0.3 %
Lymphocytes %: 23.6 %
MCH: 28.9 pg (ref 25.1–33.5)
MCHC: 33.6 g/dL (ref 31.5–35.8)
MCV: 85.9 fL (ref 78.0–96.0)
MPV: 9 fL (ref 8.9–12.5)
Monocytes %: 9.6 %
Neutrophils %: 63.9 %
Platelet Count: 311 x10 3/uL (ref 142–346)
Preliminary Absolute Neutrophil Count: 3.97 x10 3/uL (ref 1.10–6.33)
RBC: 4.54 x10 6/uL (ref 4.20–5.90)
RDW: 14 % (ref 11–15)
WBC: 6.22 x10 3/uL (ref 3.10–9.50)
nRBC %: 0 /100{WBCs} (ref <=0.0)

## 2023-08-06 LAB — ECG 12-LEAD
Atrial Rate: 90 {beats}/min
IHS MUSE NARRATIVE AND IMPRESSION: NORMAL
P Axis: 46 degrees
P-R Interval: 138 ms
Q-T Interval: 342 ms
QRS Duration: 92 ms
QTC Calculation (Bezet): 418 ms
R Axis: 33 degrees
T Axis: 21 degrees
Ventricular Rate: 90 {beats}/min

## 2023-08-06 LAB — HIGH SENSITIVITY TROPONIN-I WITH DELTA: hs Troponin: 3.5 ng/L (ref <=35.0)

## 2023-08-06 LAB — HIGH SENSITIVITY TROPONIN-I: hs Troponin: <2.7 ng/L (ref <=35.0)

## 2023-08-06 MED ORDER — BENZONATATE 100 MG PO CAPS
100.0000 mg | ORAL_CAPSULE | Freq: Three times a day (TID) | ORAL | Status: DC | PRN
Start: 2023-08-06 — End: 2023-08-07

## 2023-08-06 MED ORDER — HYDROXYZINE HCL 25 MG PO TABS
25.0000 mg | ORAL_TABLET | Freq: Four times a day (QID) | ORAL | Status: DC | PRN
Start: 2023-08-06 — End: 2023-08-07

## 2023-08-06 MED ORDER — ACETAMINOPHEN 325 MG PO TABS
325.0000 mg | ORAL_TABLET | ORAL | Status: DC | PRN
Start: 2023-08-06 — End: 2023-08-06

## 2023-08-06 MED ORDER — ONDANSETRON HCL 4 MG/2ML IJ SOLN
4.0000 mg | Freq: Once | INTRAMUSCULAR | Status: AC
Start: 2023-08-06 — End: 2023-08-06
  Administered 2023-08-06: 4 mg via INTRAVENOUS
  Filled 2023-08-06: qty 2

## 2023-08-06 MED ORDER — GLUCAGON 1 MG IJ SOLR (WRAP)
1.0000 mg | INTRAMUSCULAR | Status: DC | PRN
Start: 2023-08-06 — End: 2023-08-07

## 2023-08-06 MED ORDER — MORPHINE SULFATE 4 MG/ML IJ/IV SOLN (WRAP)
4.0000 mg | Freq: Once | Status: AC
Start: 2023-08-06 — End: 2023-08-06
  Administered 2023-08-06: 4 mg via INTRAVENOUS
  Filled 2023-08-06: qty 1

## 2023-08-06 MED ORDER — CARBOXYMETHYLCELLULOSE SODIUM 0.5 % OP SOLN
1.0000 [drp] | Freq: Three times a day (TID) | OPHTHALMIC | Status: DC | PRN
Start: 2023-08-06 — End: 2023-08-07

## 2023-08-06 MED ORDER — ACETAMINOPHEN 325 MG PO TABS
650.0000 mg | ORAL_TABLET | Freq: Four times a day (QID) | ORAL | Status: DC | PRN
Start: 2023-08-06 — End: 2023-08-06

## 2023-08-06 MED ORDER — POTASSIUM CHLORIDE 20 MEQ PO PACK
0.0000 meq | PACK | ORAL | Status: DC | PRN
Start: 2023-08-06 — End: 2023-08-07

## 2023-08-06 MED ORDER — MAGNESIUM SULFATE IN D5W 1-5 GM/100ML-% IV SOLN
1.0000 g | INTRAVENOUS | Status: DC | PRN
Start: 2023-08-06 — End: 2023-08-07

## 2023-08-06 MED ORDER — POTASSIUM & SODIUM PHOSPHATES 280-160-250 MG PO PACK
2.0000 | PACK | ORAL | Status: DC | PRN
Start: 2023-08-06 — End: 2023-08-07

## 2023-08-06 MED ORDER — GLUCOSE 40 % PO GEL (WRAP)
15.0000 g | ORAL | Status: DC | PRN
Start: 2023-08-06 — End: 2023-08-07

## 2023-08-06 MED ORDER — DEXTROSE 10 % IV BOLUS
12.5000 g | INTRAVENOUS | Status: DC | PRN
Start: 2023-08-06 — End: 2023-08-07

## 2023-08-06 MED ORDER — SALINE SPRAY 0.65 % NA SOLN
2.0000 | NASAL | Status: DC | PRN
Start: 2023-08-06 — End: 2023-08-07

## 2023-08-06 MED ORDER — ACETAMINOPHEN 500 MG PO TABS
1000.0000 mg | ORAL_TABLET | Freq: Four times a day (QID) | ORAL | Status: DC | PRN
Start: 2023-08-06 — End: 2023-08-07

## 2023-08-06 MED ORDER — DEXTROSE 50 % IV SOLN
12.5000 g | INTRAVENOUS | Status: DC | PRN
Start: 2023-08-06 — End: 2023-08-07

## 2023-08-06 MED ORDER — ACETAMINOPHEN 325 MG PO TABS
650.0000 mg | ORAL_TABLET | ORAL | Status: DC | PRN
Start: 2023-08-06 — End: 2023-08-06

## 2023-08-06 MED ORDER — BENZOCAINE-MENTHOL MT LOZG (WRAP)
1.0000 | LOZENGE | OROMUCOSAL | Status: DC | PRN
Start: 2023-08-06 — End: 2023-08-07

## 2023-08-06 MED ORDER — ENOXAPARIN SODIUM 40 MG/0.4ML IJ SOSY
40.0000 mg | PREFILLED_SYRINGE | Freq: Every day | INTRAMUSCULAR | Status: DC
Start: 2023-08-07 — End: 2023-08-07

## 2023-08-06 MED ORDER — POTASSIUM CHLORIDE CRYS ER 20 MEQ PO TBCR
0.0000 meq | EXTENDED_RELEASE_TABLET | ORAL | Status: DC | PRN
Start: 2023-08-06 — End: 2023-08-07

## 2023-08-06 MED ORDER — MELATONIN 3 MG PO TABS
3.0000 mg | ORAL_TABLET | Freq: Every evening | ORAL | Status: DC | PRN
Start: 2023-08-06 — End: 2023-08-07

## 2023-08-06 MED ORDER — POTASSIUM CHLORIDE 10 MEQ/100ML IV SOLN (WRAP)
10.0000 meq | INTRAVENOUS | Status: DC | PRN
Start: 2023-08-06 — End: 2023-08-07

## 2023-08-06 NOTE — ED Provider Notes (Signed)
 EMERGENCY DEPARTMENT HISTORY AND PHYSICAL EXAM      History of Presenting Illness:  History Provided By: Patient    Miguel Carr is a 53 y.o. male  with a hx of cad, asthma, high cholesterol brought into the ed  by ems after an episode of chest pain and near syncope while on the train today. States he had a syncopal episode on 5/21, was hospitalized but left ama. He was en route from [place] to [employer] today when he had another episode of cp and  slumped to the ground. No head injury, no loc.    Past Medical History:   Diagnosis Date   . Asthma    . CAD (coronary artery disease)    . High cholesterol    . Hypertension       Social Drivers of Health     Alcohol Use: Patient Declined (06/18/2023)    Received from [facility]    AUDIT-C    . Frequency of Alcohol Consumption: Patient declined    . Average Number of Drinks: Patient declined    . Frequency of Binge Drinking: Patient declined   Financial Resource Strain: Low Risk  (05/03/2023)    Received from [facility] Strain    . Is it difficult to pay utility bills such as heating, water, electricity or pay for medical bills and prescriptions?: Not hard at all   Food Insecurity: No Food Insecurity (08/06/2023)    Hunger Vital Sign    . Worried About Programme researcher, broadcasting/film/video in the Last Year: Never true    . Ran Out of Food in the Last Year: Never true   Recent Concern: Food Insecurity - Food Insecurity Present (06/30/2023)    Received from [facility] ([facility]    Hunger Vital Sign    . Worried About Programme researcher, broadcasting/film/video in the Last Year: Sometimes true    . Ran Out of Food in the Last Year: Sometimes true   Transportation Needs: No Transportation Needs (08/06/2023)    PRAPARE - Transportation    . Lack of Transportation (Medical): No    . Lack of Transportation (Non-Medical): No   Physical Activity: Not on file   Stress: Not on file   Social Connections: Unknown (05/07/2022)    Received  from [facility]    Social Connections    . In the past 3 months, do you feel that you lack companionship or social support?: Not on file   Intimate Partner Violence: Not At Risk (08/06/2023)    Humiliation, Afraid, Rape, and Kick questionnaire    . Fear of Current or Ex-Partner: No    . Emotionally Abused: No    . Physically Abused: No    . Sexually Abused: No   Depression: Not at risk (06/30/2023)    Received from [facility] ([facility]    PHQ-2    . PHQ-2 Total Score: 0   Housing Stability: Low Risk  (08/06/2023)    Housing Stability Vital Sign    . Unable to Pay for Housing in the Last Year: No    . Number of Times Moved in the Last Year: 0    . Homeless in the Last Year: No   Recent Concern: Housing Stability - High Risk (06/30/2023)    Received from [facility] ([facility]    Housing Stability    . What  is your living situation today?: I do not have a steady place to live (temporarily staying with others, in a hotel, in a shelter, ...    . Housing Stability: Not on file   Utilities: Not At Risk (08/06/2023)    [facility] Utilities    . Threatened with loss of utilities: No   Recent Concern: Utilities - At Risk (06/15/2023)    Received from [facility]    [facility] Utilities    . Threatened with loss of utilities: Yes   Health Literacy: Unknown (04/23/2019)    Received from [facility]    . Understand medical condition: Not on file    . Medication management: Not on file   Tobacco Use: High Risk (08/06/2023)    Patient History    . Smoking Tobacco Use: Some Days    . Smokeless Tobacco Use: Never    . Passive Exposure: Not on file       Reviewed and confirmed past medical, surgical, family and social history as documented.    PCP: Pcp, None, MD  SPECIALISTS:    Physical Exam:  Vitals:    08/06/23 1624   BP: 97/64   Pulse: 94   Resp: (!) 23   Temp: 98.9 F (37.2 C)   TempSrc: Oral   SpO2: 96%   Weight: 89.1 kg   Height: 6'  1" (1.854 m)     Pulse Oximetry Interpretation: Normal     Physical Exam  Vitals and nursing note reviewed.   Constitutional:       General: He is not in acute distress.     Appearance: Normal appearance. He is not ill-appearing, toxic-appearing or diaphoretic.   HENT:      Head: Normocephalic and atraumatic.      Nose: Nose normal. No congestion or rhinorrhea.      Mouth/Throat:      Mouth: Mucous membranes are moist.      Pharynx: No oropharyngeal exudate or posterior oropharyngeal erythema.   Eyes:      General: No scleral icterus.     Extraocular Movements: Extraocular movements intact.      Pupils: Pupils are equal, round, and reactive to light.   Neck:      Vascular: No carotid bruit.   Cardiovascular:      Rate and Rhythm: Normal rate and regular rhythm.      Pulses: Normal pulses.           Carotid pulses are 2+ on the right side and 2+ on the left side.       Radial pulses are 2+ on the right side and 2+ on the left side.        Dorsalis pedis pulses are 2+ on the right side and 2+ on the left side.        Posterior tibial pulses are 2+ on the right side and 2+ on the left side.      Heart sounds: Normal heart sounds. No murmur heard.     No friction rub. No gallop.   Pulmonary:      Effort: Pulmonary effort is normal. No respiratory distress.      Breath sounds: Normal breath sounds. No stridor. No decreased breath sounds, wheezing, rhonchi or rales.   Chest:      Chest wall: No tenderness.   Abdominal:      General: Abdomen is flat. There is no distension.      Tenderness: There  is no abdominal tenderness. There is no right CVA tenderness or left CVA tenderness.   Musculoskeletal:         General: No tenderness. Normal range of motion.      Cervical back: Normal range of motion and neck supple. No rigidity or tenderness.      Right lower leg: No edema.      Left lower leg: No edema.   Lymphadenopathy:      Cervical: No cervical adenopathy.   Skin:     General: Skin is warm and dry.      Capillary Refill:  Capillary refill takes less than 2 seconds.   Neurological:      General: No focal deficit present.      Mental Status: He is alert and oriented to person, place, and time.      Cranial Nerves: No cranial nerve deficit.      Motor: No weakness.   Psychiatric:         Mood and Affect: Mood normal. Mood is not anxious.         Behavior: Behavior normal. Behavior is not agitated.         Thought Content: Thought content normal.         Judgment: Judgment normal.        Cardiac Monitor  Rate: 94  Rhythm: Normal Sinus Rhythm     EKG:  Interpreted by the EP.   Time Interpreted: 1624 08/06/23   Rate: 90   Rhythm: Normal Sinus Rhythm    Interpretation:   Comparison: unchanged from previous 02/06/24    Labs:   Abnormal Labs Reviewed   COMPREHENSIVE METABOLIC PANEL - Abnormal; Notable for the following components:       Result Value    Glucose 106 (*)     Alkaline Phosphatase 126 (*)     All other components within normal limits       All labs have been personally reviewed.     Imaging (Xray/CT/US /MRI):      Chest AP Portable   Final Result      No acute abnormal findings.      Jackolyn Masker, MD   08/06/2023 5:17 PM        Reviewed and interpreted by me, confirmed by radiology report.    Summary of Prior Notes (date, source and summary):  Previous Notes: Yes. Reviewed dc summary notes from 08/05/21 that state seen for chest pain, syncope. Had ct, trop, EKG, neg. Stress test recommended, patient declined and left ama.   Previous labs or imaging review: Yes.   CT Head WO Contrast  Order: [id number]  Impression        1. No acute intracranial hemorrhage.  2. No fracture or malalignment of the cervical spine.  3. Radiopaque linear density seen within the right external auditory canal, concern for foreign body. Recommend direct visualization.          ATTENDING PHYSICIAN ATTESTATION [ATT05]: I have personally reviewed the images and agree with this report.  Narrative    CLINICAL INFORMATION: 53 year old patient on xarelto  reporting  chest pain, followed by syncopal event. Patient denies head strike.    TECHNIQUE: Routine unenhanced CT of the head. Routine unenhanced CT of the cervical spine.    COMPARISON: None available.    FINDINGS:    No acute intracranial hemorrhage, extracerebral fluid collection, midline shift or mass effect.  No fracture of the calvarium, skull base and visualized maxillofacial bones.    Probable chronic  ischemic changes of the white matter. Hypodensity in the left aspect of the pons (series 2, image 13) is favored artifactual and not definitively seen on thin slice reconstruction. Intracranial atherosclerosis.  Cerebral volume loss with enlargement of the ventricles. Asymmetric prominence of the left lateral ventricle.  Visualized orbits are unremarkable.  Visualized paranasal sinuses are essentially clear.  Large volume debris seen within the bilateral external auditory canals, favored to reflect cerumen. Radiopaque linear density seen within the right external auditory canal, possibly a foreign body (series 607, image 10; series 604, image 72)  Multiple dental caries.    No acute fracture of the cervical spine. No traumatic malalignment of the cervical spine.   This examination does not assess for ligamentous injury or stability.  No appreciable traumatic intraspinal abnormality by CT technique, noting this exam is not dedicated for intraspinal/cord evaluation.    Multilevel spondylotic changes of the cervical spine but no significant cervical spinal canal stenosis.  Multilevel disc height loss, worst at C5-C6 where there is also associated vacuum disc phenomenon. Diffuse disc bulge at C3-C4 and C4-C5 resulting in mild spinal canal stenosis.    Visualized lung apices are unremarkable.  The esophagus is patulous, which may be due to gastroesophageal reflux or esophageal dysmotility.  Exam End: 08/05/23 13:37    Specimen Collected: 08/05/23 13:39 Last Resulted: 08/05/23 14:04   [facility]    Results  CT Cervical Spine WO Contrast (Order [id number])     CT Cervical Spine WO Contrast  Order: [id number]  Impression        1. No acute intracranial hemorrhage.  2. No fracture or malalignment of the cervical spine.  3. Radiopaque linear density seen within the right external auditory canal, concern for foreign body. Recommend direct visualization.          ATTENDING PHYSICIAN ATTESTATION [ATT05]: I have personally reviewed the images and agree with this report.  Narrative    CLINICAL INFORMATION: 53 year old patient on xarelto  reporting chest pain, followed by syncopal event. Patient denies head strike.    TECHNIQUE: Routine unenhanced CT of the head. Routine unenhanced CT of the cervical spine.    COMPARISON: None available.    FINDINGS:    No acute intracranial hemorrhage, extracerebral fluid collection, midline shift or mass effect.  No fracture of the calvarium, skull base and visualized maxillofacial bones.    Probable chronic ischemic changes of the white matter. Hypodensity in the left aspect of the pons (series 2, image 13) is favored artifactual and not definitively seen on thin slice reconstruction. Intracranial atherosclerosis.  Cerebral volume loss with enlargement of the ventricles. Asymmetric prominence of the left lateral ventricle.  Visualized orbits are unremarkable.  Visualized paranasal sinuses are essentially clear.  Large volume debris seen within the bilateral external auditory canals, favored to reflect cerumen. Radiopaque linear density seen within the right external auditory canal, possibly a foreign body (series 607, image 10; series 604, image 72)  Multiple dental caries.    No acute fracture of the cervical spine. No traumatic malalignment of the cervical spine.   This examination does not assess for ligamentous injury or stability.  No appreciable traumatic intraspinal abnormality by CT technique, noting this exam is not dedicated for intraspinal/cord evaluation.     Multilevel spondylotic changes of the cervical spine but no significant cervical spinal canal stenosis.  Multilevel disc height loss, worst at C5-C6 where there is also associated vacuum disc phenomenon. Diffuse disc bulge at C3-C4  and C4-C5 resulting in mild spinal canal stenosis.    Visualized lung apices are unremarkable.  The esophagus is patulous, which may be due to gastroesophageal reflux or esophageal dysmotility.  Exam End: 08/05/23 13:37    Specimen Collected: 08/05/23 13:39 Last Resulted: 08/05/23 14:04   [facility]  Result Received: 08/06/23 17:20    Abdomen Pelvis W IV/ WO PO Cont  Order: [id number]  Impression        No acute abnormality in the abdomen or pelvis.    ATTENDING PHYSICIAN ATTESTATION [ATT05]: I have personally reviewed the images and agree with this report.  Narrative    STUDY: ENHANCED CT OF THE ABDOMEN AND PELVIS    CLINICAL INFORMATION:    History: dissection suspected  dissection/chest pain    PROCEDURE: Enhanced CT examination of the abdomen and pelvis was performed after administration of intravenous contrast.    Intravenous contrast: 100 mL of IOPAMIDOL  76 % IV SOLN    Enteric contrast: None.    COMPARISON: None.    FINDINGS:    LOWER THORAX: A separate chest CT has been performed and will be independently reported.    LIVER: Normal in size and configuration. No suspicious mass. The portal veins are patent.        BILE DUCTS: No intrahepatic or extrahepatic bile duct dilation.    GALLBLADDER: No calcified stones. Normal wall thickness.    PANCREAS: Unremarkable. No main pancreatic duct dilation.    SPLEEN: Unremarkable.    ADRENAL GLANDS: Unremarkable.    KIDNEYS/URETERS: No hydroureteronephrosis. No suspicious renal mass.    BLADDER: Unremarkable.    REPRODUCTIVE ORGANS: Normal size prostate. Symmetric seminal vesicles.    BOWEL: No disproportionate dilation of the small or large bowel. The appendix is unremarkable. Probable small  hiatal hernia.    PERITONEUM/RETROPERITONEUM: No fluid collection, ascites, or pneumoperitoneum.    LYMPH NODES: No abdominal or pelvic lymphadenopathy.    VESSELS: No abdominal aortic aneurysm [ABAN00]. No findings suspicious for abdominal aortic dissection. No high grade disease of the proximal mesenteric arteries (SMA, celiac, or IMA).    ABDOMINAL/PELVIC WALL: Unremarkable.    BONES: No suspicious osseous lesions.    NON-EMERGENT ACTIONABLE FINDINGS  Recommendation: No specific recommendation. [REC0]  Exam End: 08/05/23 14:01    Specimen Collected: 08/05/23 14:17 Last Resulted: 08/05/23 14:40   [facility]: [facility]  Result Received: 08/06/23 17:20        1.  Bronchitis. Mild cardiomegaly.    2.  No other acute or suspicious abnormality is seen in the chest. In particular, no thoracic aortic dissection or intramural hematoma is noted.    Pulmonary nodule follow-up recommendation:    [LN17N] Not Applicable.  Narrative    CLINICAL INFORMATION: Atrial fibrillation on Xarelto , coronary artery disease, prior myocardial infarction, prior stroke, now presenting with exertional chest pain and dyspnea, undergoing evaluation for suspected aortic dissection.    PROCEDURE: Contrast enhanced CT of the chest was performed using thin section reconstructions.    INTRAVENOUS CONTRAST: 100 ml of IOPAMIDOL  76 % IV SOLN.    COMPARISON: Chest radiograph 02/18/2021.    FINDINGS:    LUNGS, PLEURA:    There is a small amount of mucus in the thoracic trachea. There is diffuse bronchial wall thickening along with foci of peripheral mucoid impaction, in keeping with bronchitis.    There is mild bibasilar curvilinear atelectasis. No pulmonary consolidation or groundglass opacity is seen.    There is  a 4 mm juxtafissural solid nodule in the lingula on image 193 series 611 and a 5 mm juxtafissural solid nodule in the left lower lobe in image 171 series 611, in keeping with intrapulmonary lymph nodes.    No  pneumothorax is seen. No pleural effusion is noted.    CARDIOVASCULAR, MEDIASTINUM, THYROID:    The heart size is mildly enlarged, noting mild left atrial dilation. No pericardial effusion is seen. The central vasculature is patent, and of normal course and caliber. No thoracic aortic dissection or intramural hematoma is noted. There is atherosclerotic calcification of the thoracic aorta.      The visualized thyroid gland appears grossly normal. The esophagus is patulous and contains some fluid, which may be due to gastroesophageal reflux or esophageal dysmotility. There is a small hiatal hernia.    LYMPH NODES: No thoracic lymphadenopathy is seen.    SKELETON, CHEST WALL: There is mild degenerative change of the thoracic spine. Several old right-sided rib fractures are noted. No acute fractures or suspicious osseous lesions are seen.    UPPER ABDOMEN: Please see the separate abdominal CT report from the same day for full details regarding the abdominal contents.  Exam End: 08/05/23 14:02    Specimen Collected: 08/05/23 14:15 Last Resulted: 08/05/23 14:26   Received From: [facility]  Result Received: 08/06/23 17:20    View <redacted file path>       A complete transthoracic echocardiogram (including 2D, color flow  Doppler, spectral Doppler and M-mode imaging) was performed using the  standard protocol. The study quality was adequate.  .  The left ventricle is normal in size. Normal left ventricular wall  thickness. Endocardium is incompletely visualized, but no regional wall  motion abnormalities are noted in the visualized segments. Normal left  ventricular ejection fraction. The left ventricular ejection fraction by  visual estimate is 55% to 60%.  .  LV diastolic function parameters were not assessed.  .  The right ventricle is normal in size. There is normal function of the  right ventricle.  .  The left atrium is normal in size.  .  The right atrium is normal in size.  .  The  mitral valve is normal in structure. There is no mitral  regurgitation. There is no mitral stenosis.  .  The aortic valve is normal in structure and trileaflet. There is no  aortic stenosis. There is no aortic regurgitation.  .  The tricuspid valve is normal in structure. There is no tricuspid  regurgitation.  .  There is no prior study available for comparison at this organization.    In summary, overall normal left ventricular systolic function with  estimated EF 55-60%.  No regional wall motion abnormalities noted within  the limits of image quality.  Recommend repeat limited study with  ultrasound enhancing agent to better assess for wall motion abnormalities  if clinically indicated.  No significant valve disease noted.  PA systolic  pressure could not be estimated due to lack of significant TR jet.    Left Ventricle  The left ventricle is normal in size. Normal left ventricular wall thickness. Endocardium is incompletely visualized, but no regional wall motion abnormalities are noted in the visualized segments. A repeat limited study with ultrasound enhancing (microbubble) agent may be indicated for wall motion and LVEF assessment. The left ventricular ejection fraction by visual estimate is 55% to 60%. Normal left ventricular ejection fraction. LV diastolic function parameters were not assessed.  Right Ventricle  The right ventricle is normal in size. There is normal function of the right ventricle. The tricuspid annular plane systolic excursion (TAPSE) is normal. Right ventricular TAPSE measures 2.4 cm. Right ventricular S' measures 11.5 cm/s.    Left Atrium  The left atrium is normal in size.    Right Atrium  The right atrium is normal in size.    Mitral Valve  The mitral valve is normal in structure. There is no mitral stenosis. There is no mitral regurgitation.    Tricuspid Valve  The tricuspid valve is normal in structure. There is no tricuspid stenosis. There is no tricuspid regurgitation.    Aortic  Valve  The aortic valve is normal in structure and trileaflet. There is no aortic stenosis. There is no aortic regurgitation.    Pulmonic Valve  The pulmonic valve was not well visualized. There is no pulmonic stenosis. There is no pulmonic valve regurgitation.    Pericardium  No pericardial effusion.    Septum  No obvious interatrial communication noted with color Doppler imaging.    Aortic Arch/Descending/Abdominal Aorta  The aortic root is normal in size. The proximal segment of the ascending aorta is normal in size.    Study Details  A complete transthoracic echocardiogram (including 2D, color flow Doppler, spectral Doppler and M-mode imaging) was performed using the standard protocol. The study quality was adequate.    Nuclear Prior Study  There is no prior study available for comparison at this organization.  All Measurements     Exam End: 08/05/23 15:30 Last Resulted: 08/05/23 15:59   Received From: [facility]  Resu     Patient Update Notes:     Heart Score      Flowsheet Row Value   History 1   EKG 1   Risk Factors 2   Total (with age) 5   Onset of pain (time of START of last episode of chest pain)? 0-3 hrs ago      Medication given in the ED:    ED Medication Orders (From admission, onward)      Start Ordered     Status Ordering Provider    08/06/23 1721 08/06/23 1720  morphine  injection 4 mg  Once        Route: Intravenous  Ordered Dose: 4 mg       Last MAR action: Given CONNOR, ERIN C    08/06/23 1721 08/06/23 1720  ondansetron  (ZOFRAN ) injection 4 mg  Once        Route: Intravenous  Ordered Dose: 4 mg       Last MAR action: Given CONNOR, ERIN C            Provider Notes: ddx: acs, chf, dehydration, electrolyte abnormality, arrythmia    Heart score 5, delta trop negative  Given 2 recent syncopal episodes and persistent cp, will admit  Dr. Yvonnie Heritage, level 3 tele obs    Considered (meds/labs/imaging/admission): considered ct angio chest, had one yesterday with no evidence of  PE    Prescribed (and considered but not prescribed):   Current Discharge Medication List          Clinical Impression:   1. Syncope, unspecified syncope type    2. Chest pain        ED Disposition       ED Disposition   Observation    Condition   --    Date/Time   Thu Aug 06, 2023 10:59 PM  Comment   Admitting Physician: Taunya Fass [[id number]]   Service:: Medicine [106]   Estimated Length of Stay: < 2 midnights   Tentative Discharge Plan?: Home or Self Care [1]   Does patient need telemetry?: Yes   Is patient 18 yrs or greater?: Yes   Telemetry type (separate Telemetry order is also required):: Adult telemetry               CHART OWNERSHIP: Dr. Nicoletta Barrier is the primary emergency physician of record.    This note was generated by the Epic EMR system/ Dragon speech recognition and may contain inherent errors or omissions not intended by the user. Grammatical errors, random word insertions, deletions and pronoun errors  are occasional consequences of this technology due to software limitations. Not all errors are caught or corrected. If there are questions or concerns about the content of this note or information contained within the body of this dictation they should be addressed directly with the author for clarification     Eilleen Grates, DO  08/06/23 2358
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
## 2023-08-06 NOTE — Discharge Summary (Signed)
 Inpatient Discharge Summary    BRIEF OVERVIEW  Patient Name: Miguel Carr   Discharge Provider: Baxter Limber, MD   Primary Care Physician: NO PCP None       Admission Date: 08/05/2023      Patient Class: Observation     Problems at Discharge       Diagnosis    . *(Principal Problem)Chest pain [R07.9]    . Syncope [R55]       Resolved Hospital Problems   No resolved problems to display.          Discharge Disposition  The patient left against medical advice in  fair condition.     Discharge Medications  Current and discharge medications have been reconciled. See attached medication list or After Visit Summary for discharge medications.     Active/Incidental Issues Requiring Follow-up  Issue: Syncope  Responsible Individual: Patient  What is Needed: PCP follow up  Follow-up Appointments Arranged: Patient left AMA    Test Results Pending at Discharge  Provider responsible for follow-up of pending labs: N/A         DETAILS OF HOSPITAL [facility] Course    Patient is a 53 y.o. male with PMH of hx of CAD with multiple stents, hx 2 CVA's in the past, HTN, paroxysmal A.fib on Xarelto , schizophrenia who presents with chest pain and syncopal episodes.      Per patient, he was taking a train from [name]  to [place]  when he started to have severe substernal chest pain and then he felt lightheaded and passed out. Unclear how long he lost consciousness but EMS was called and brought to the ED.  In the ED, EKG with no evidence of ischemia, CT head with no fracture, CT abdomen negative and CT chest with no other abnormalities but noted mild cardiology and bronchitis.     On evaluation, patient states that he continues to have chest pain that is constant in nature. HE denies any shortness of breath associated with it. He does request a dose of morphine  for pain control. He denies any recent nausea/vomiting or diarrhea and states that his appetite has been okay recently      Per chart review, patient has had several ED  visits for chest pain, syncope and SI. 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     Hospital Course by Problem  # Chest pain:  Trop negative and EKG unremarkable. Given his history of CAD, planned to obtain stress test. Patient decided to leave AMA prior to obtaining stress test.    # Syncope:   Etiology unknown; however, CT head negative, orthostatics negative. UDS negative. Monitored on telemetry.      # A.fib, paroxysmal  Continued lower dose of metoprolol  at 50 mg twice daily. Continued Xarelto  and tele.    # Hx of CVA:   Continued aspirin      Operative Procedures Performed  N/A  Procedures: none  Consults: none      Physical Exam at Discharge  BP 105/64 , Pulse 55 , Temp 97.5 F (36.4 C) , Resp 16 , Ht 5\' 11"  (1.803 m) , Wt 187 lb 14.4 oz (85.2 kg) , BMI 26.21 kg/m2 , SpO2 94%   The patient has decided to leave against medical advice because he wanted to catch the morning train.  he  risks have been explained in detail to to the patient, including potential death. The patient was able recite these back and sign paperwork indicating the they understand the risks.

## 2023-08-06 NOTE — EDIE (Signed)
 [facility] NOTIFICATION 08/06/2023 10:14 [name], [name] A DOB: 11/24/1970 MRN: [id number]    Criteria Met      3 Different Facilities in 90 Days    5 ED Visits in 12 Months    History of Suicide Ideation or Self-Harm (12 mo.)    Security and Safety  No Security Events were found.  ED Care Guidelines  There are currently no ED Care Guidelines for this patient. Please check your facility's medical records system.        Prescription Monitoring Program  Narx Score not available at this time.    E.D. Visit Count (12 mo.)  Facility Visits   [facility] 4   [facility] 3   [facility] 2   [facility] 2   [facility] 2   [facility] 2   [facility] 2   [facility]  2   [facility] 2   [facility] 2   [facility] 1   [facility] 1   [facility] 1   [facility] 1   [facility] 1   [facility] 1   [facility] 1   [facility] 1   [facility] 1   [facility] 1   [facility] 1   [facility] 1   [facility]  1   [place]  [facility] 1   [facility] 1   [facility] 1   [facility] 1   [facility] 1   [facility] 1   [facility] 1   [facility] 1   [facility] 1   [facility] 1   [facility] [place]  [facility] 1   [facility]  1   [facility] 1   [facility] 1   [facility] 1   [facility]  [facility] 1   Total 52   Note: Visits indicate  total known visits.     Recent Emergency Department Visit Summary  Showing 10 most recent visits out of 52 in the past 12 months   Date Facility [facility] Type Diagnoses or Chief Complaint    Aug 06, 2023  [facility]  [place]  Emergency      chest pain      Aug 02, 2023  [facility]  [place]  Emergency      Personal history of transient ischemic attack (TIA), and cerebral infarction without residual deficits      Hyperlipidemia, unspecified      Long term (current) use of anticoagulants      Essential (primary) hypertension      Atherosclerotic heart disease of native coronary artery without angina pectoris      Unspecified atrial fibrillation      Right lower quadrant pain      Nausea      Jul 29, 2023  [facility]  [place]  [place]  Emergency  Chief Complaint: AMB-SECTION 12    Jul 29, 2023  [facility]  [place]  [place]  Emergency      Essential (primary) hypertension      Atherosclerotic heart disease of native coronary artery without angina pectoris      Nicotine  dependence, unspecified, uncomplicated      Syncope and collapse      Chest pain, unspecified      Personal history of transient ischemic attack (TIA), and cerebral infarction without residual deficits      Long term (current) use of anticoagulants      Jul 28, 2023  [facility] [place]  [place]  Emergency      Unspecified abdominal pain      Abdominal Pain      abd pain      Jul 28, 2023  [facility]  [place]  [place]  Emergency      Nausea And Vomiting      abdominal pain      Jul 25, 2023  [facility]  [place]  [place]  Emergency      Suicidal ideations      Intermittent explosive disorder      Psychiatric Evaluation      Psyc. Eval.      Jul 24, 2023  [facility]  [place]  Emergency      Chest pain, unspecified      Chest pain      Jul 23, 2023  [facility]  [place]  [place]  Emergency      Chest Pain      Jul 23, 2023  [facility]  [place]  [place]  Emergency       Angina pectoris, unspecified        Recent Inpatient Visit Summary  Date Facility [facility] Type Diagnoses or Chief Complaint    Aug 01, 2023  [facility]  [place]  [place]  Cardiology  Chief Complaint: UPDATE    Jul 20, 2023  [place] [place].  [place]  Observation      Hyperlipidemia, unspecified      Personal history of transient ischemic attack (TIA), and cerebral infarction without residual deficits      Allergy status to other drugs, medicaments and biological substances      Long term (current) use of anticoagulants      Other long term (current) drug therapy      Essential (primary) hypertension      Shortness of breath      Old myocardial infarction      Other chest pain      Tobacco use      Jul 08, 2023  [place]  [facility]   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  [place]  [place]  General Medicine      Syncope and collapse      Jun 03, 2023  [facility]  [place].  [place]  General Medicine     May 29, 2023  [facility] [place].  [place]  Acute Care      Unspecified atrial fibrillation      Syncope and collapse      Atrial fibrillation with RVR; Chest pain; Syncope,      Chest pain, unspecified      chest pain  Jan 26, 2023  [facility]  [place]  [place]  Inpatient      1. Suicidal ideations      Jan 19, 2023  [facility]  [place].  [place]  Inpatient      1. Post-traumatic stress disorder, unspecified      2. Zygomatic fracture, unspecified side, initial encounter for closed fracture      3. Suicidal ideations      4. Essential (primary) hypertension      5. Pure hypercholesterolemia, unspecified      6. Malingerer [conscious simulation]      7. Old myocardial infarction      8. Unspecified atrial fibrillation      9. Personal history of transient ischemic attack (TIA), and cerebral infarction without residual deficits      10. Long term (current) use of  aspirin         Care Team  Provider Specialty Phone Fax Service Dates   AARON, SHAWN , MD Family Medicine [phone]  Apr 17, 2018 - Current    -, [facility] Dentist: General Practice [phone]  Current    -, [facility] Dentist: General Practice [phone]  Current    ISAIAH BENNET DOVER, M.D. Family Medicine [phone] Current    REA SHARPER MD K, M.D. Emergency Medicine [phone]  Current    TISHA, ERIC W, DO Emergency Medicine [phone]  Current    DORFMAN, JUSTIN , D.O. Internal Medicine   Current    VON CRAWFORD, MD Student in an [facility] Education/Training Program [phone]  Current    Sundra Hastings, [place], [facility] Counselor: Professional [phone]  Current    CHESLEY MA NO PCP OR REF, M.D. Internal Medicine [phone] Current    PERKINS, SHARPER MT, M.D. Internal Medicine: Pulmonary Disease [phone] Current    STANLEY BERKELEY, MD Psychiatry and Neurology: Neurology [phone] Current    CALISTA NORRINE RAISIN, M.D. Emergency Medicine [phone]  Current    CAROLYNN NIEMANN, ELVA., LMFT Marriage and Family Therapist [phone] Current      [facility]  This patient has registered at the [facility] Emergency Department  For more information visit: [email or web address]     PLEASE NOTE:     1.   Any care recommendations and other clinical information are provided as guidelines or for historical purposes only, and providers should exercise their own clinical judgment when providing care.    2.   You may only use this information for purposes of treatment, payment or health care operations activities, and subject to the limitations of applicable [facility] Policies.    3.   You should consult directly with the  organization that provided a care guideline or other clinical history with any questions about additional information or accuracy or completeness of information provided.    ? 2025 [facility] - www.[email or web address]

## 2023-08-06 NOTE — ED Triage Notes (Signed)
 [place] train station c/o CP. Per EMS, pt was traveling via train [place] when CP onset. Pt reported he had episode of CP w/ syncope on 08/05/23 and was seen at hospital in [place]  where he was advised to obtain stress test; pt anticipated obtaining stress once in [place]. Hx a-fib, x2 CVAs and x2 MI. EMS gave 324 mg aspirin  PTA.     EMS VS: 115/64  HR - 100  98% on RA

## 2023-08-06 NOTE — ED Provider Notes (Signed)
 EMERGENCY DEPARTMENT HISTORY AND PHYSICAL EXAM      History of Presenting Illness:  History Provided By: Patient    [name] [name] is a 53 y.o. male  with a hx of cad, asthma, high cholesterol brought into the ed  by ems after an episode of chest pain and near syncope while on the train today. States he had a syncopal episode on 5/21, was hospitalized but left ama. He was en route from [place] to [employer] today when he had another episode of cp and  slumped to the ground. No head injury, no loc.    Past Medical History:   Diagnosis Date    Asthma     CAD (coronary artery disease)     High cholesterol     Hypertension       Social Drivers of Health     Alcohol Use: Patient Declined (06/18/2023)    Received from [facility]    AUDIT-C     Frequency of Alcohol Consumption: Patient declined     Average Number of Drinks: Patient declined     Frequency of Binge Drinking: Patient declined   Financial Resource Strain: Low Risk  (05/03/2023)    Received from [facility] Strain     Is it difficult to pay utility bills such as heating, water, electricity or pay for medical bills and prescriptions?: Not hard at all   Food Insecurity: No Food Insecurity (08/06/2023)    Hunger Vital Sign     Worried About Running Out of Food in the Last Year: Never true     Ran Out of Food in the Last Year: Never true   Recent Concern: Food Insecurity - Food Insecurity Present (06/30/2023)    Received from [facility] ([facility]    Hunger Vital Sign     Worried About Running Out of Food in the Last Year: Sometimes true     Ran Out of Food in the Last Year: Sometimes true   Transportation Needs: No Transportation Needs (08/06/2023)    PRAPARE - Therapist, art (Medical): No     Lack of Transportation (Non-Medical): No   Physical Activity: Not on file   Stress: Not on file   Social Connections: Unknown (05/07/2022)    Received from [facility]    Social Connections     In the past 3 months, do you feel that you lack companionship or social support?: Not on file   Intimate Partner Violence: Not At Risk (08/06/2023)    Humiliation, Afraid, Rape, and Kick questionnaire     Fear of Current or Ex-Partner: No     Emotionally Abused: No     Physically Abused: No     Sexually Abused: No   Depression: Not at risk (06/30/2023)    Received from [facility] ([facility]    PHQ-2     PHQ-2 Total Score: 0   Housing Stability: Low Risk  (08/06/2023)    Housing Stability Vital Sign     Unable to Pay for Housing in the Last Year: No     Number of Times Moved in the Last Year: 0     Homeless in the Last Year: No   Recent Concern: Housing Stability - High Risk (06/30/2023)    Received from [facility] ([facility]    Housing Stability     What  is your living situation today?: I do not have a steady place to live (temporarily staying with others, in a hotel, in a shelter, ...     Housing Stability: Not on file   Utilities: Not At Risk (08/06/2023)    [facility] Utilities     Threatened with loss of utilities: No   Recent Concern: Utilities - At Risk (06/15/2023)    Received from [facility] Utilities     Threatened with loss of utilities: Yes   Health Literacy: Unknown (04/23/2019)    Received from [facility]    Health Literacy     Understand medical condition: Not on file     Medication management: Not on file   Tobacco Use: High Risk (08/06/2023)    Patient History     Smoking Tobacco Use: Some Days     Smokeless Tobacco Use: Never     Passive Exposure: Not on file       Reviewed and confirmed past medical, surgical, family and social history as documented.    PCP: Pcp, None, MD  SPECIALISTS:    Physical Exam:  Vitals:    08/06/23 1624   BP: 97/64   Pulse: 94   Resp: (!) 23   Temp: 98.9 F (37.2 C)   TempSrc: Oral   SpO2: 96%   Weight: 89.1 kg   Height: 6' 1 (1.854 m)     Pulse Oximetry  Interpretation: Normal     Physical Exam  Vitals and nursing note reviewed.   Constitutional:       General: He is not in acute distress.     Appearance: Normal appearance. He is not ill-appearing, toxic-appearing or diaphoretic.   HENT:      Head: Normocephalic and atraumatic.      Nose: Nose normal. No congestion or rhinorrhea.      Mouth/Throat:      Mouth: Mucous membranes are moist.      Pharynx: No oropharyngeal exudate or posterior oropharyngeal erythema.   Eyes:      General: No scleral icterus.     Extraocular Movements: Extraocular movements intact.      Pupils: Pupils are equal, round, and reactive to light.   Neck:      Vascular: No carotid bruit.   Cardiovascular:      Rate and Rhythm: Normal rate and regular rhythm.      Pulses: Normal pulses.           Carotid pulses are 2+ on the right side and 2+ on the left side.       Radial pulses are 2+ on the right side and 2+ on the left side.        Dorsalis pedis pulses are 2+ on the right side and 2+ on the left side.        Posterior tibial pulses are 2+ on the right side and 2+ on the left side.      Heart sounds: Normal heart sounds. No murmur heard.     No friction rub. No gallop.   Pulmonary:      Effort: Pulmonary effort is normal. No respiratory distress.      Breath sounds: Normal breath sounds. No stridor. No decreased breath sounds, wheezing, rhonchi or rales.   Chest:      Chest wall: No tenderness.   Abdominal:      General: Abdomen is flat. There is no distension.      Tenderness: There  is no abdominal tenderness. There is no right CVA tenderness or left CVA tenderness.   Musculoskeletal:         General: No tenderness. Normal range of motion.      Cervical back: Normal range of motion and neck supple. No rigidity or tenderness.      Right lower leg: No edema.      Left lower leg: No edema.   Lymphadenopathy:      Cervical: No cervical adenopathy.   Skin:     General: Skin is warm and dry.      Capillary Refill: Capillary refill takes less than  2 seconds.   Neurological:      General: No focal deficit present.      Mental Status: He is alert and oriented to person, place, and time.      Cranial Nerves: No cranial nerve deficit.      Motor: No weakness.   Psychiatric:         Mood and Affect: Mood normal. Mood is not anxious.         Behavior: Behavior normal. Behavior is not agitated.         Thought Content: Thought content normal.         Judgment: Judgment normal.        Cardiac Monitor  Rate: 94  Rhythm: Normal Sinus Rhythm     EKG:  Interpreted by the EP.   Time Interpreted: 1624 08/06/23   Rate: 90   Rhythm: Normal Sinus Rhythm    Interpretation:   Comparison: unchanged from previous 02/06/24    Labs:   Abnormal Labs Reviewed   COMPREHENSIVE METABOLIC PANEL - Abnormal; Notable for the following components:       Result Value    Glucose 106 (*)     Alkaline Phosphatase 126 (*)     All other components within normal limits       All labs have been personally reviewed.     Imaging (Xray/CT/US /MRI):      Chest AP Portable   Final Result      No acute abnormal findings.      Deward Holt, MD   08/06/2023 5:17 PM        Reviewed and interpreted by me, confirmed by radiology report.    Summary of Prior Notes (date, source and summary):  Previous Notes: Yes. Reviewed [place] summary notes from 08/05/21 that state seen for chest pain, syncope. Had ct, trop, EKG, neg. Stress test recommended, patient declined and left ama.   Previous labs or imaging review: Yes.   CT Head WO Contrast  Order: [id number]  Impression        1. No acute intracranial hemorrhage.  2. No fracture or malalignment of the cervical spine.  3. Radiopaque linear density seen within the right external auditory canal, concern for foreign body. Recommend direct visualization.          ATTENDING PHYSICIAN ATTESTATION [ATT05]: I have personally reviewed the images and agree with this report.  Narrative    CLINICAL INFORMATION: 53 year old patient on xarelto reporting chest pain, followed by syncopal  event. Patient denies head strike.    TECHNIQUE: Routine unenhanced CT of the head. Routine unenhanced CT of the cervical spine.    COMPARISON: None available.    FINDINGS:    No acute intracranial hemorrhage, extracerebral fluid collection, midline shift or mass effect.  No fracture of the calvarium, skull base and visualized maxillofacial bones.    Probable chronic  ischemic changes of the white matter. Hypodensity in the left aspect of the pons (series 2, image 13) is favored artifactual and not definitively seen on thin slice reconstruction. Intracranial atherosclerosis.  Cerebral volume loss with enlargement of the ventricles. Asymmetric prominence of the left lateral ventricle.  Visualized orbits are unremarkable.  Visualized paranasal sinuses are essentially clear.  Large volume debris seen within the bilateral external auditory canals, favored to reflect cerumen. Radiopaque linear density seen within the right external auditory canal, possibly a foreign body (series 607, image 10; series 604, image 72)  Multiple dental caries.    No acute fracture of the cervical spine. No traumatic malalignment of the cervical spine.   This examination does not assess for ligamentous injury or stability.  No appreciable traumatic intraspinal abnormality by CT technique, noting this exam is not dedicated for intraspinal/cord evaluation.    Multilevel spondylotic changes of the cervical spine but no significant cervical spinal canal stenosis.  Multilevel disc height loss, worst at C5-C6 where there is also associated vacuum disc phenomenon. Diffuse disc bulge at C3-C4 and C4-C5 resulting in mild spinal canal stenosis.    Visualized lung apices are unremarkable.  The esophagus is patulous, which may be due to gastroesophageal reflux or esophageal dysmotility.  Exam End: 08/05/23 13:37    Specimen Collected: 08/05/23 13:39 Last Resulted: 08/05/23 14:04   Received From: [facility]    Results  CT  Cervical Spine WO Contrast (Order [id number])     CT Cervical Spine WO Contrast  Order: [id number]  Impression        1. No acute intracranial hemorrhage.  2. No fracture or malalignment of the cervical spine.  3. Radiopaque linear density seen within the right external auditory canal, concern for foreign body. Recommend direct visualization.          ATTENDING PHYSICIAN ATTESTATION [ATT05]: I have personally reviewed the images and agree with this report.  Narrative    CLINICAL INFORMATION: 53 year old patient on xarelto reporting chest pain, followed by syncopal event. Patient denies head strike.    TECHNIQUE: Routine unenhanced CT of the head. Routine unenhanced CT of the cervical spine.    COMPARISON: None available.    FINDINGS:    No acute intracranial hemorrhage, extracerebral fluid collection, midline shift or mass effect.  No fracture of the calvarium, skull base and visualized maxillofacial bones.    Probable chronic ischemic changes of the white matter. Hypodensity in the left aspect of the pons (series 2, image 13) is favored artifactual and not definitively seen on thin slice reconstruction. Intracranial atherosclerosis.  Cerebral volume loss with enlargement of the ventricles. Asymmetric prominence of the left lateral ventricle.  Visualized orbits are unremarkable.  Visualized paranasal sinuses are essentially clear.  Large volume debris seen within the bilateral external auditory canals, favored to reflect cerumen. Radiopaque linear density seen within the right external auditory canal, possibly a foreign body (series 607, image 10; series 604, image 72)  Multiple dental caries.    No acute fracture of the cervical spine. No traumatic malalignment of the cervical spine.   This examination does not assess for ligamentous injury or stability.  No appreciable traumatic intraspinal abnormality by CT technique, noting this exam is not dedicated for intraspinal/cord evaluation.    Multilevel spondylotic  changes of the cervical spine but no significant cervical spinal canal stenosis.  Multilevel disc height loss, worst at C5-C6 where there is also associated vacuum disc phenomenon. Diffuse disc bulge at C3-C4  and C4-C5 resulting in mild spinal canal stenosis.    Visualized lung apices are unremarkable.  The esophagus is patulous, which may be due to gastroesophageal reflux or esophageal dysmotility.  Exam End: 08/05/23 13:37    Specimen Collected: 08/05/23 13:39 Last Resulted: 08/05/23 14:04   Received From: [facility]  Result Received: 08/06/23 17:20    Abdomen Pelvis W IV/ WO PO Cont  Order: [id number]  Impression        No acute abnormality in the abdomen or pelvis.    ATTENDING PHYSICIAN ATTESTATION [ATT05]: I have personally reviewed the images and agree with this report.  Narrative    STUDY: ENHANCED CT OF THE ABDOMEN AND PELVIS    CLINICAL INFORMATION:    History: dissection suspected  dissection/chest pain    PROCEDURE: Enhanced CT examination of the abdomen and pelvis was performed after administration of intravenous contrast.    Intravenous contrast: 100 mL of IOPAMIDOL 76 % IV SOLN    Enteric contrast: None.    COMPARISON: None.    FINDINGS:    LOWER THORAX: A separate chest CT has been performed and will be independently reported.    LIVER: Normal in size and configuration. No suspicious mass. The portal veins are patent.        BILE DUCTS: No intrahepatic or extrahepatic bile duct dilation.    GALLBLADDER: No calcified stones. Normal wall thickness.    PANCREAS: Unremarkable. No main pancreatic duct dilation.    SPLEEN: Unremarkable.    ADRENAL GLANDS: Unremarkable.    KIDNEYS/URETERS: No hydroureteronephrosis. No suspicious renal mass.    BLADDER: Unremarkable.    REPRODUCTIVE ORGANS: Normal size prostate. Symmetric seminal vesicles.    BOWEL: No disproportionate dilation of the small or large bowel. The appendix is unremarkable. Probable small hiatal  hernia.    PERITONEUM/RETROPERITONEUM: No fluid collection, ascites, or pneumoperitoneum.    LYMPH NODES: No abdominal or pelvic lymphadenopathy.    VESSELS: No abdominal aortic aneurysm [ABAN00]. No findings suspicious for abdominal aortic dissection. No high grade disease of the proximal mesenteric arteries (SMA, celiac, or IMA).    ABDOMINAL/PELVIC WALL: Unremarkable.    BONES: No suspicious osseous lesions.    NON-EMERGENT ACTIONABLE FINDINGS  Recommendation: No specific recommendation. [REC0]  Exam End: 08/05/23 14:01    Specimen Collected: 08/05/23 14:17 Last Resulted: 08/05/23 14:40   Received From: [facility]  Result Received: 08/06/23 17:20        1.  Bronchitis. Mild cardiomegaly.    2.  No other acute or suspicious abnormality is seen in the chest. In particular, no thoracic aortic dissection or intramural hematoma is noted.    Pulmonary nodule follow-up recommendation:    [LN17N] Not Applicable.  Narrative    CLINICAL INFORMATION: Atrial fibrillation on Xarelto, coronary artery disease, prior myocardial infarction, prior stroke, now presenting with exertional chest pain and dyspnea, undergoing evaluation for suspected aortic dissection.    PROCEDURE: Contrast enhanced CT of the chest was performed using thin section reconstructions.    INTRAVENOUS CONTRAST: 100 ml of IOPAMIDOL 76 % IV SOLN.    COMPARISON: Chest radiograph 02/18/2021.    FINDINGS:    LUNGS, PLEURA:    There is a small amount of mucus in the thoracic trachea. There is diffuse bronchial wall thickening along with foci of peripheral mucoid impaction, in keeping with bronchitis.    There is mild bibasilar curvilinear atelectasis. No pulmonary consolidation or groundglass opacity is seen.    There is  a 4 mm juxtafissural solid nodule in the lingula on image 193 series 611 and a 5 mm juxtafissural solid nodule in the left lower lobe in image 171 series 611, in keeping with intrapulmonary lymph nodes.    No  pneumothorax is seen. No pleural effusion is noted.    CARDIOVASCULAR, MEDIASTINUM, THYROID:    The heart size is mildly enlarged, noting mild left atrial dilation. No pericardial effusion is seen. The central vasculature is patent, and of normal course and caliber. No thoracic aortic dissection or intramural hematoma is noted. There is atherosclerotic calcification of the thoracic aorta.      The visualized thyroid gland appears grossly normal. The esophagus is patulous and contains some fluid, which may be due to gastroesophageal reflux or esophageal dysmotility. There is a small hiatal hernia.    LYMPH NODES: No thoracic lymphadenopathy is seen.    SKELETON, CHEST WALL: There is mild degenerative change of the thoracic spine. Several old right-sided rib fractures are noted. No acute fractures or suspicious osseous lesions are seen.    UPPER ABDOMEN: Please see the separate abdominal CT report from the same day for full details regarding the abdominal contents.  Exam End: 08/05/23 14:02    Specimen Collected: 08/05/23 14:15 Last Resulted: 08/05/23 14:26   Received From: [facility]  Result Received: 08/06/23 17:20    View       A complete transthoracic echocardiogram (including 2D, color flow  Doppler, spectral Doppler and M-mode imaging) was performed using the  standard protocol. The study quality was adequate.    The left ventricle is normal in size. Normal left ventricular wall  thickness. Endocardium is incompletely visualized, but no regional wall  motion abnormalities are noted in the visualized segments. Normal left  ventricular ejection fraction. The left ventricular ejection fraction by  visual estimate is 55% to 60%.    LV diastolic function parameters were not assessed.    The right ventricle is normal in size. There is normal function of the  right ventricle.    The left atrium is normal in size.    The right atrium is normal in size.    The mitral valve is normal in  structure. There is no mitral  regurgitation. There is no mitral stenosis.    The aortic valve is normal in structure and trileaflet. There is no  aortic stenosis. There is no aortic regurgitation.    The tricuspid valve is normal in structure. There is no tricuspid  regurgitation.    There is no prior study available for comparison at this organization.    In summary, overall normal left ventricular systolic function with  estimated EF 55-60%.  No regional wall motion abnormalities noted within  the limits of image quality.  Recommend repeat limited study with  ultrasound enhancing agent to better assess for wall motion abnormalities  if clinically indicated.  No significant valve disease noted.  PA systolic  pressure could not be estimated due to lack of significant TR jet.    Left Ventricle  The left ventricle is normal in size. Normal left ventricular wall thickness. Endocardium is incompletely visualized, but no regional wall motion abnormalities are noted in the visualized segments. A repeat limited study with ultrasound enhancing (microbubble) agent may be indicated for wall motion and LVEF assessment. The left ventricular ejection fraction by visual estimate is 55% to 60%. Normal left ventricular ejection fraction. LV diastolic function parameters were not assessed.    Right Ventricle  The right ventricle is normal in size. There is normal function of the right ventricle. The tricuspid annular plane systolic excursion (TAPSE) is normal. Right ventricular TAPSE measures 2.4 cm. Right ventricular S' measures 11.5 cm/s.    Left Atrium  The left atrium is normal in size.    Right Atrium  The right atrium is normal in size.    Mitral Valve  The mitral valve is normal in structure. There is no mitral stenosis. There is no mitral regurgitation.    Tricuspid Valve  The tricuspid valve is normal in structure. There is no tricuspid stenosis. There is no tricuspid regurgitation.    Aortic Valve  The aortic valve is  normal in structure and trileaflet. There is no aortic stenosis. There is no aortic regurgitation.    Pulmonic Valve  The pulmonic valve was not well visualized. There is no pulmonic stenosis. There is no pulmonic valve regurgitation.    Pericardium  No pericardial effusion.    Septum  No obvious interatrial communication noted with color Doppler imaging.    Aortic Arch/Descending/Abdominal Aorta  The aortic root is normal in size. The proximal segment of the ascending aorta is normal in size.    Study Details  A complete transthoracic echocardiogram (including 2D, color flow Doppler, spectral Doppler and M-mode imaging) was performed using the standard protocol. The study quality was adequate.    Nuclear Prior Study  There is no prior study available for comparison at this organization.  All Measurements     Exam End: 08/05/23 15:30 Last Resulted: 08/05/23 15:59   Received From: [facility]  Resu     Patient Update Notes:     Heart Score      Flowsheet Row Value   History 1   EKG 1   Risk Factors 2   Total (with age) 5   Onset of pain (time of START of last episode of chest pain)? 0-3 hrs ago      Medication given in the ED:    ED Medication Orders (From admission, onward)      Start Ordered     Status Ordering Provider    08/06/23 1721 08/06/23 1720  morphine  injection 4 mg  Once        Route: Intravenous  Ordered Dose: 4 mg       Last MAR action: Given Jojuan Champney C    08/06/23 1721 08/06/23 1720  ondansetron  (ZOFRAN ) injection 4 mg  Once        Route: Intravenous  Ordered Dose: 4 mg       Last MAR action: Given Larenzo Caples C            Provider Notes: ddx: acs, chf, dehydration, electrolyte abnormality, arrythmia    Heart score 5, delta trop negative  Given 2 recent syncopal episodes and persistent cp, will admit  Dr. Jerrye, level 3 tele obs    Considered (meds/labs/imaging/admission): considered ct angio chest, had one yesterday with no evidence of PE    Prescribed (and  considered but not prescribed):   Current Discharge Medication List          Clinical Impression:   1. Syncope, unspecified syncope type    2. Chest pain        ED Disposition       ED Disposition   Observation    Condition   --    Date/Time   Thu Aug 06, 2023 10:59 PM    Comment  Admitting Physician: JERRYE EDSEL BROCKS [[id number]]   Service:: Medicine [106]   Estimated Length of Stay: < 2 midnights   Tentative Discharge Plan?: Home or Self Care [1]   Does patient need telemetry?: Yes   Is patient 18 yrs or greater?: Yes   Telemetry type (separate Telemetry order is also required):: Adult telemetry               CHART OWNERSHIP: Dr. Rocky Freund is the primary emergency physician of record.    This note was generated by the Epic EMR system/ Dragon speech recognition and may contain inherent errors or omissions not intended by the user. Grammatical errors, random word insertions, deletions and pronoun errors  are occasional consequences of this technology due to software limitations. Not all errors are caught or corrected. If there are questions or concerns about the content of this note or information contained within the body of this dictation they should be addressed directly with the author for clarification     Freund Rocky BROCKS, DO  08/06/23 2358
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
## 2023-08-06 NOTE — ED to IP RN Note (Signed)
 [facility]  ED NURSING NOTE FOR THE RECEIVING INPATIENT NURSE   ED NURSE Larren Copes   SPECTRALINK [id number]   ED CHARGE RN yennely   ADMISSION INFORMATION   Sim Choquette is a 53 y.o. male admitted with an ED diagnosis of:    1. Chest pain         Isolation: None   Allergies: Benadryl [diphenhydramine], Ketorolac, Toradol [ketorolac tromethamine], Tramadol, and Ultram [tramadol hcl]   Holding Orders confirmed? No   Belongings Documented? No   Home medications sent to pharmacy confirmed? N/A   NURSING CARE   Patient Comes From:   Mental Status: Home Independent  alert and oriented   ADL: Independent with all ADLs   Ambulation: Has not walked in ED   Pertinent Information  and Safety Concerns:     Broset Violence Risk Level: Low N/a     CT / NIH   CT Head ordered on this patient?  No   NIH/Dysphagia assessment done prior to admission? N/A   VITAL SIGNS (at the time of this note)      Vitals:    08/06/23 2100   BP: 112/72   Pulse: 82   Resp: 19   Temp:    SpO2: 94%     Pain Score: 9-severe pain (08/06/23 1624)

## 2023-08-06 NOTE — Discharge Summary (Signed)
 Inpatient Discharge Summary    BRIEF OVERVIEW  Patient Name: [name] [name]   Discharge Provider: Baxter Limber, MD   Primary Care Physician: NO PCP None       Admission Date: 08/05/2023      Patient Class: Observation     Problems at Discharge       Diagnosis    . *(Principal Problem)Chest pain [R07.9]    . Syncope [R55]       Resolved Hospital Problems   No resolved problems to display.          Discharge Disposition  The patient left against medical advice in  fair condition.     Discharge Medications  Current and discharge medications have been reconciled. See attached medication list or After Visit Summary for discharge medications.     Active/Incidental Issues Requiring Follow-up  Issue: Syncope  Responsible Individual: Patient  What is Needed: PCP follow up  Follow-up Appointments Arranged: Patient left AMA    Test Results Pending at Discharge  Provider responsible for follow-up of pending labs: N/A         DETAILS OF HOSPITAL [facility] Course    Patient is a 53 y.o. male with PMH of hx of CAD with multiple stents, hx 2 CVA's in the past, HTN, paroxysmal A.fib on Xarelto , schizophrenia who presents with chest pain and syncopal episodes.      Per patient, he was taking a train from [name]  to [place]  when he started to have severe substernal chest pain and then he felt lightheaded and passed out. Unclear how long he lost consciousness but EMS was called and brought to the ED.  In the ED, EKG with no evidence of ischemia, CT head with no fracture, CT abdomen negative and CT chest with no other abnormalities but noted mild cardiology and bronchitis.     On evaluation, patient states that he continues to have chest pain that is constant in nature. HE denies any shortness of breath associated with it. He does request a dose of morphine  for pain control. He denies any recent nausea/vomiting or diarrhea and states that his appetite has been okay recently      Per chart review, patient has had several ED  visits for chest pain, syncope and SI. 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     Hospital Course by Problem  # Chest pain:  Trop negative and EKG unremarkable. Given his history of CAD, planned to obtain stress test. Patient decided to leave AMA prior to obtaining stress test.    # Syncope:   Etiology unknown; however, CT head negative, orthostatics negative. UDS negative. Monitored on telemetry.      # A.fib, paroxysmal  Continued lower dose of metoprolol  at 50 mg twice daily. Continued Xarelto  and tele.    # Hx of CVA:   Continued aspirin      Operative Procedures Performed  N/A  Procedures: none  Consults: none      Physical Exam at Discharge  BP 105/64 , Pulse 55 , Temp 97.5 F (36.4 C) , Resp 16 , Ht 5\' 11"  (1.803 m) , Wt 187 lb 14.4 oz (85.2 kg) , BMI 26.21 kg/m2 , SpO2 94%   The patient has decided to leave against medical advice because he wanted to catch the morning train.  he  risks have been explained in detail to to the patient, including potential death. The patient was able recite these back and sign paperwork indicating the they understand the risks.

## 2023-08-06 NOTE — ED Notes (Signed)
 Bed: BL17  Expected date: 08/06/23  Expected time: 3:58 PM  Means of arrival: [id number] [place]  Comments:  Medic 205-chest pain

## 2023-08-07 LAB — URINE DRUGS OF ABUSE SCREEN
Urine Amphetamine Screen: NEGATIVE
Urine Barbituate Screen: NEGATIVE
Urine Benzodiazepine Screen: NEGATIVE
Urine Cannabinoid Screen: POSITIVE — AB
Urine Cocaine Screen: NEGATIVE
Urine Fentanyl Screen: NEGATIVE
Urine Opiate Screen: POSITIVE — AB
Urine PCP Screen: NEGATIVE

## 2023-08-07 MED ORDER — NITROGLYCERIN 0.4 MG SL SUBL
0.4000 mg | SUBLINGUAL_TABLET | SUBLINGUAL | Status: DC | PRN
Start: 2023-08-06 — End: 2023-08-07

## 2023-08-07 MED ORDER — ATORVASTATIN CALCIUM 40 MG PO TABS
40.0000 mg | ORAL_TABLET | Freq: Every day | ORAL | Status: DC
Start: 2023-08-07 — End: 2023-08-07

## 2023-08-07 MED ORDER — METOPROLOL TARTRATE 50 MG PO TABS
100.0000 mg | ORAL_TABLET | Freq: Two times a day (BID) | ORAL | Status: DC
Start: 2023-08-07 — End: 2023-08-07

## 2023-08-07 MED ORDER — QUETIAPINE FUMARATE 25 MG PO TABS
25.0000 mg | ORAL_TABLET | Freq: Every evening | ORAL | Status: DC | PRN
Start: 2023-08-07 — End: 2023-08-07

## 2023-08-07 MED ORDER — RIVAROXABAN 20 MG PO TABS
20.0000 mg | ORAL_TABLET | Freq: Every day | ORAL | Status: DC
Start: 2023-08-07 — End: 2023-08-07

## 2023-08-07 MED ORDER — ASPIRIN 81 MG PO CHEW
81.0000 mg | CHEWABLE_TABLET | Freq: Every day | ORAL | Status: DC
Start: 2023-08-07 — End: 2023-08-07

## 2023-08-07 MED ORDER — LISINOPRIL 10 MG PO TABS
20.0000 mg | ORAL_TABLET | Freq: Every day | ORAL | Status: DC
Start: 2023-08-07 — End: 2023-08-07

## 2023-08-07 NOTE — Nursing Note (Signed)
 NURSING SHIFT NOTE     Patient: Miguel Carr  Day: 0      SHIFT EVENTS     Shift Narrative/Significant Events (PRN med administration, fall, RRT, etc.):     Pt was stable, no complaints of pain or discomfort. Safety and fall precautions were place. Purposeful rounding completed. Pt woke up this morning feeling good, no chest pain, dizziness, or lightheaded. Pt walked to bathroom independently without symptoms. Pt decided to leave AMA. Was not able to convince him to stay through morning rounds.  AMA procedure was explained, reminded pt to follow up with a Dr at home, notified Dr, iv removed and gave pt a copy of the AMA form. Pt left by foot about 0520 this morning.         ASSESSMENT     Changes in assessment from patient's baseline this shift:    Neuro: No  CV: No  Pulm: No  Peripheral Vascular: No  HEENT: No  GI: No  BM during shift: No, Last BM:    GU: No   Integ: No  MS: No    Pain: None  Pain Interventions:   Medications Utilized:     Mobility: PMP Activity: Step 6 - Walks in Room of Distance Walked (ft) (Step 6,7): 20 Feet           Lines     Patient Lines/Drains/Airways Status       Active Lines, Drains and Airways       None                         VITAL SIGNS     Vitals:    08/07/23 0006   BP: 105/71   Pulse: 74   Resp: 16   Temp: 97.9 F (36.6 C)   SpO2: 98%       Temp  Min: 97.9 F (36.6 C)  Max: 98.9 F (37.2 C)  Pulse  Min: 71  Max: 94  Resp  Min: 15  Max: 23  BP  Min: 97/64  Max: 130/83  SpO2  Min: 94 %  Max: 98 %    No intake or output data in the 24 hours ending 08/07/23 0818

## 2023-08-07 NOTE — Nursing Note (Signed)
 4 eyes in 4 hours pressure injury assessment note:      Completed with: Miguel Carr  Unit & Time admitted: 25 completed about 0005             Bony Prominences: Check appropriate box; if Pressure Injury is present enter Pressure Injury assessment in LDA    Occiput:                    []  Pressure Injury present  Face:                        []  Pressure Injury present  Ears:                         []  Pressure Injury present  Spine:                       []  Pressure Injury present  Shoulders:                []  Pressure Injury present  Elbows:                     []  Pressure Injury present  Sacrum/coccyx:        []  Pressure Injury present  Ischial Tuberosity:    []  Pressure Injury present  Trochanter/Hip:        []  Pressure Injury present  Knees:                      []  Pressure Injury present  Ankles:                     []  Pressure Injury present  Heels:                       []  Pressure Injury present  Other pressure areas: []  Pressure Injury location       Device related: []  Device name:         LDA completed if pressure injury present: yes/no  Consult WOCN if necessary    Other skin related issues, ie tears, rash, etc, document in Integumentary flowsheet

## 2023-08-07 NOTE — Nursing Progress Note (Signed)
 4 eyes in 4 hours pressure injury assessment note:      Completed with: [name] [name]  Unit & Time admitted: 25 completed about 0005             Bony Prominences: Check appropriate box; if Pressure Injury is present enter Pressure Injury assessment in LDA    Occiput:                    []  Pressure Injury present  Face:                        []  Pressure Injury present  Ears:                         []  Pressure Injury present  Spine:                       []  Pressure Injury present  Shoulders:                []  Pressure Injury present  Elbows:                     []  Pressure Injury present  Sacrum/coccyx:        []  Pressure Injury present  Ischial Tuberosity:    []  Pressure Injury present  Trochanter/Hip:        []  Pressure Injury present  Knees:                      []  Pressure Injury present  Ankles:                     []  Pressure Injury present  Heels:                       []  Pressure Injury present  Other pressure areas: []  Pressure Injury location       Device related: []  Device name:         LDA completed if pressure injury present: yes/no  Consult WOCN if necessary    Other skin related issues, ie tears, rash, etc, document in Integumentary flowsheet

## 2023-08-07 NOTE — Nursing Progress Note (Signed)
 NURSING SHIFT NOTE     Patient: [name] [name]  Day: 0      SHIFT EVENTS     Shift Narrative/Significant Events (PRN med administration, fall, RRT, etc.):     Pt was stable, no complaints of pain or discomfort. Safety and fall precautions were place. Purposeful rounding completed. Pt woke up this morning feeling good, no chest pain, dizziness, or lightheaded. Pt walked to bathroom independently without symptoms. Pt decided to leave AMA. Was not able to convince him to stay through morning rounds.  AMA procedure was explained, reminded pt to follow up with a Dr at home, notified Dr, iv removed and gave pt a copy of the AMA form. Pt left by foot about 0520 this morning.         ASSESSMENT     Changes in assessment from patient's baseline this shift:    Neuro: No  CV: No  Pulm: No  Peripheral Vascular: No  HEENT: No  GI: No  BM during shift: No, Last BM:    GU: No   Integ: No  MS: No    Pain: None  Pain Interventions:   Medications Utilized:     Mobility: PMP Activity: Step 6 - Walks in Room of Distance Walked (ft) (Step 6,7): 20 Feet           Lines     Patient Lines/Drains/Airways Status       Active Lines, Drains and Airways       None                         VITAL SIGNS     Vitals:    08/07/23 0006   BP: 105/71   Pulse: 74   Resp: 16   Temp: 97.9 F (36.6 C)   SpO2: 98%       Temp  Min: 97.9 F (36.6 C)  Max: 98.9 F (37.2 C)  Pulse  Min: 71  Max: 94  Resp  Min: 15  Max: 23  BP  Min: 97/64  Max: 130/83  SpO2  Min: 94 %  Max: 98 %    No intake or output data in the 24 hours ending 08/07/23 0818
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
## 2023-08-10 ENCOUNTER — Emergency Department: Admit: 2023-08-10

## 2023-08-10 ENCOUNTER — Inpatient Hospital Stay: Admit: 2023-08-10

## 2023-08-10 ENCOUNTER — Inpatient Hospital Stay
Admission: EM | Admit: 2023-08-10 | Discharge: 2023-08-11 | Discharge status: Against Medical Advice | Admitting: Student in an Organized Health Care Education/Training Program

## 2023-08-10 DIAGNOSIS — R55 Syncope and collapse: Secondary | ICD-10-CM

## 2023-08-10 DIAGNOSIS — R079 Chest pain, unspecified: Secondary | ICD-10-CM

## 2023-08-10 LAB — COMPREHENSIVE METABOLIC PANEL
ALT: 28 U/L (ref 12–78)
AST: 19 U/L (ref 15–37)
Albumin/Globulin Ratio: 1.5 (ref 1.1–2.2)
Albumin: 3.7 g/dL (ref 3.5–5.0)
Alk Phosphatase: 109 U/L (ref 45–117)
Anion Gap: 5 mmol/L (ref 2–12)
BUN/Creatinine Ratio: 25 — ABNORMAL HIGH (ref 12–20)
BUN: 19 mg/dL (ref 6–20)
CO2: 25 mmol/L (ref 21–32)
Calcium: 8.4 mg/dL — ABNORMAL LOW (ref 8.5–10.1)
Chloride: 113 mmol/L — ABNORMAL HIGH (ref 97–108)
Creatinine: 0.76 mg/dL (ref 0.70–1.30)
Est, Glom Filt Rate: >90 mL/min/{1.73_m2} (ref >60)
Globulin: 2.5 g/dL (ref 2.0–4.0)
Glucose: 90 mg/dL (ref 65–100)
Potassium: 4 mmol/L (ref 3.5–5.1)
Sodium: 143 mmol/L (ref 136–145)
Total Bilirubin: 0.2 mg/dL (ref 0.2–1.0)
Total Protein: 6.2 g/dL — ABNORMAL LOW (ref 6.4–8.2)

## 2023-08-10 LAB — EKG 12-LEAD
Atrial Rate: 83 {beats}/min
Diagnosis: NORMAL
P Axis: 16 degrees
P-R Interval: 124 ms
Q-T Interval: 364 ms
QRS Duration: 100 ms
QTc Calculation (Bazett): 427 ms
R Axis: 10 degrees
T Axis: 4 degrees
Ventricular Rate: 83 {beats}/min

## 2023-08-10 LAB — CBC WITH AUTO DIFFERENTIAL
Basophils %: 0.7 % (ref 0.0–1.0)
Basophils Absolute: 0.03 10*3/uL (ref 0.00–0.10)
Eosinophils %: 2.4 % (ref 0.0–7.0)
Eosinophils Absolute: 0.11 10*3/uL (ref 0.00–0.40)
Hematocrit: 37.5 % (ref 36.6–50.3)
Hemoglobin: 12 g/dL — ABNORMAL LOW (ref 12.1–17.0)
Immature Granulocytes %: 0.4 % (ref 0.0–0.5)
Immature Granulocytes Absolute: 0.02 10*3/uL (ref 0.00–0.04)
Lymphocytes %: 20.3 % (ref 12.0–49.0)
Lymphocytes Absolute: 0.92 10*3/uL (ref 0.80–3.50)
MCH: 28.5 pg (ref 26.0–34.0)
MCHC: 32 g/dL (ref 30.0–36.5)
MCV: 89.1 FL (ref 80.0–99.0)
MPV: 9.1 FL (ref 8.9–12.9)
Monocytes %: 9.5 % (ref 5.0–13.0)
Monocytes Absolute: 0.43 10*3/uL (ref 0.00–1.00)
Neutrophils %: 66.7 % (ref 32.0–75.0)
Neutrophils Absolute: 3.03 10*3/uL (ref 1.80–8.00)
Nucleated RBCs: 0 /100{WBCs}
Platelets: 243 10*3/uL (ref 150–400)
RBC: 4.21 M/uL (ref 4.10–5.70)
RDW: 14.1 % (ref 11.5–14.5)
WBC: 4.5 10*3/uL (ref 4.1–11.1)
nRBC: 0 10*3/uL (ref 0.00–0.01)

## 2023-08-10 LAB — PHOSPHORUS: Phosphorus: 3.9 mg/dL (ref 2.6–4.7)

## 2023-08-10 LAB — BRAIN NATRIURETIC PEPTIDE: NT Pro-BNP: 115 pg/mL (ref <125)

## 2023-08-10 LAB — TROPONIN
Troponin, High Sensitivity: 5 ng/L (ref 0–76)
Troponin, High Sensitivity: 6 ng/L (ref 0–76)

## 2023-08-10 LAB — MAGNESIUM: Magnesium: 2.2 mg/dL (ref 1.6–2.4)

## 2023-08-10 MED ORDER — ONDANSETRON HCL 4 MG/2ML IJ SOLN
4 | Freq: Four times a day (QID) | INTRAMUSCULAR | Status: DC | PRN
Start: 2023-08-10 — End: 2023-08-11

## 2023-08-10 MED ORDER — ONDANSETRON HCL 4 MG/2ML IJ SOLN
4 | INTRAMUSCULAR | Status: DC | PRN
Start: 2023-08-10 — End: 2023-08-10

## 2023-08-10 MED ORDER — ENOXAPARIN SODIUM 80 MG/0.8ML IJ SOSY
80 | Freq: Two times a day (BID) | INTRAMUSCULAR | Status: DC
Start: 2023-08-10 — End: 2023-08-10

## 2023-08-10 MED ORDER — ACETAMINOPHEN 650 MG RE SUPP
650 | Freq: Four times a day (QID) | RECTAL | Status: DC | PRN
Start: 2023-08-10 — End: 2023-08-11

## 2023-08-10 MED ORDER — POTASSIUM CHLORIDE 10 MEQ/100ML IV SOLN
10 | INTRAVENOUS | Status: DC | PRN
Start: 2023-08-10 — End: 2023-08-11

## 2023-08-10 MED ORDER — POLYETHYLENE GLYCOL 3350 17 G PO PACK
17 | Freq: Every day | ORAL | Status: DC | PRN
Start: 2023-08-10 — End: 2023-08-11

## 2023-08-10 MED ORDER — SODIUM CHLORIDE 0.9 % IV SOLN
0.9 | INTRAVENOUS | Status: DC | PRN
Start: 2023-08-10 — End: 2023-08-11

## 2023-08-10 MED ORDER — NORMAL SALINE FLUSH 0.9 % IV SOLN
0.9 | INTRAVENOUS | Status: DC | PRN
Start: 2023-08-10 — End: 2023-08-11

## 2023-08-10 MED ORDER — ONDANSETRON 4 MG PO TBDP
4 | Freq: Three times a day (TID) | ORAL | Status: DC | PRN
Start: 2023-08-10 — End: 2023-08-11

## 2023-08-10 MED ORDER — NITROGLYCERIN 0.4 MG SL SUBL
0.4 | SUBLINGUAL | Status: DC | PRN
Start: 2023-08-10 — End: 2023-08-10

## 2023-08-10 MED ORDER — METOPROLOL SUCCINATE ER 50 MG PO TB24
50 | Freq: Every day | ORAL | Status: DC
Start: 2023-08-10 — End: 2023-08-11

## 2023-08-10 MED ORDER — POTASSIUM CHLORIDE CRYS ER 20 MEQ PO TBCR
20 | ORAL | Status: DC | PRN
Start: 2023-08-10 — End: 2023-08-11

## 2023-08-10 MED ORDER — NORMAL SALINE FLUSH 0.9 % IV SOLN
0.9 | Freq: Two times a day (BID) | INTRAVENOUS | Status: DC
Start: 2023-08-10 — End: 2023-08-11
  Administered 2023-08-11: 01:00:00 5 mL via INTRAVENOUS

## 2023-08-10 MED ORDER — ATORVASTATIN CALCIUM 40 MG PO TABS
40 | Freq: Every evening | ORAL | Status: DC
Start: 2023-08-10 — End: 2023-08-11
  Administered 2023-08-11: 01:00:00 80 mg via ORAL

## 2023-08-10 MED ORDER — MAGNESIUM SULFATE 2000 MG/50 ML IVPB PREMIX
2 | INTRAVENOUS | Status: DC | PRN
Start: 2023-08-10 — End: 2023-08-11

## 2023-08-10 MED ORDER — ACETAMINOPHEN 325 MG PO TABS
325 | ORAL | Status: DC | PRN
Start: 2023-08-10 — End: 2023-08-10

## 2023-08-10 MED ORDER — RIVAROXABAN 20 MG PO TABS
20 | Freq: Every day | ORAL | Status: DC
Start: 2023-08-10 — End: 2023-08-11

## 2023-08-10 MED ORDER — ACETAMINOPHEN 325 MG PO TABS
325 | Freq: Four times a day (QID) | ORAL | Status: DC | PRN
Start: 2023-08-10 — End: 2023-08-11

## 2023-08-10 MED ORDER — POTASSIUM BICARB-CITRIC ACID 20 MEQ PO TBEF
20 | ORAL | Status: DC | PRN
Start: 2023-08-10 — End: 2023-08-11

## 2023-08-10 MED ORDER — LISINOPRIL 5 MG PO TABS
5 | Freq: Every day | ORAL | Status: DC
Start: 2023-08-10 — End: 2023-08-11

## 2023-08-10 MED ORDER — OXYCODONE HCL 5 MG PO TABS
5 | Freq: Four times a day (QID) | ORAL | Status: DC | PRN
Start: 2023-08-10 — End: 2023-08-10

## 2023-08-10 MED ORDER — NITROGLYCERIN 0.4 MG SL SUBL
0.4 | SUBLINGUAL | Status: DC | PRN
Start: 2023-08-10 — End: 2023-08-11

## 2023-08-10 MED ORDER — IOPAMIDOL 76 % IV SOLN
76 | Freq: Once | INTRAVENOUS | Status: AC | PRN
Start: 2023-08-10 — End: 2023-08-10
  Administered 2023-08-10: 22:00:00 100 mL via INTRAVENOUS

## 2023-08-10 MED ORDER — ASPIRIN 81 MG PO CHEW
81 | Freq: Every day | ORAL | Status: DC
Start: 2023-08-10 — End: 2023-08-11

## 2023-08-10 MED ORDER — OXYCODONE HCL 5 MG PO TABS
5 | Freq: Four times a day (QID) | ORAL | Status: DC | PRN
Start: 2023-08-10 — End: 2023-08-11

## 2023-08-10 NOTE — ED Notes (Signed)
 Patient left lying.   Resting with eyes closed.  NAD.  Callbell with in reach.

## 2023-08-10 NOTE — ED Notes (Signed)
 Laneta Pintos, Paramedic at bedside for US  guided IV

## 2023-08-10 NOTE — ED Triage Notes (Signed)
 Patient arrives to ED via EMS for c/o syncope in [place]. Per EMS, patient lowered himself to the ground. Patient now c/o left sided chest pain constant non radiating that is sharp and pressure.

## 2023-08-10 NOTE — ED Notes (Signed)
Dr Keefer at bedside.

## 2023-08-10 NOTE — ED Notes (Signed)
 Radiology at bedside

## 2023-08-10 NOTE — ED Provider Notes (Signed)
 [facility]  EMERGENCY DEPARTMENT ENCOUNTER       Pt Name: Miguel Carr  MRN: [id number]  Birthdate [birth year]  Date of evaluation: 08/10/2023  Provider: Dorrine Gaudy, MD   PCP: No primary care provider on file.  Note Started: 6:52 PM 08/10/23     CHIEF COMPLAINT       Chief Complaint   Patient presents with    Chest Pain        HISTORY OF PRESENT ILLNESS: 1 or more elements      History From: Patient, History limited by: No limitations     Miguel Carr is a 53 y.o. 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  Pain is described as a pressure located to the left side of his chest with radiation up into the left shoulder.  It onset around neuro prior to arrival, states he was walking into a pharmacy when symptoms onset and he experienced a syncopal episode.  Patient has had this occur multiple times over the past week and was admitted to [facility]  in [place] and left AMA prior to stress test, he was also admitted recently to [facility] for same symptoms and left AMA.     Nursing Notes were all reviewed and agreed with or any disagreements were addressed in the HPI.     REVIEW OF SYSTEMS        Positives and Pertinent negatives as per HPI.    PAST HISTORY     Past Medical History:  Past Medical History:   Diagnosis Date    Hypertension     Hypertension     Myocardial infarct [facility])     Myocardial infarct (HCC) 2013       Past Surgical History:  History reviewed. No pertinent surgical history.    Family History:  History reviewed. No pertinent family history.    Social History:  Social History     Tobacco Use    Smoking status: Never   Substance Use Topics    Alcohol use: No    Drug use: No     Comment: used to medical marijuana for back pain       Allergies:  Allergies   Allergen Reactions    Diphenhydramine Other (See Comments)     "I can take  oral, not IV. My whole body locks up."    Metoclopramide Other (See Comments)     Muscle spasms    Ketorolac Tromethamine Rash    Tramadol Rash       CURRENT MEDICATIONS      Previous Medications    ASPIRIN  81 MG CHEWABLE TABLET    Take 1 tablet by mouth daily    ATORVASTATIN  (LIPITOR ) 20 MG TABLET    Take 20 mg by mouth daily    LISINOPRIL  (PRINIVIL ;ZESTRIL ) 10 MG TABLET    Take 10 mg by mouth daily     METOPROLOL  SUCCINATE (TOPROL  XL) 100 MG EXTENDED RELEASE TABLET    Take 1 tablet by mouth daily    NITROGLYCERIN  (NITROSTAT ) 0.4 MG SL TABLET    Take 0.4 mg by mouth    ONDANSETRON  (ZOFRAN -ODT) 4 MG DISINTEGRATING TABLET    Take 1 tablet by mouth 3 times daily as needed for Nausea or Vomiting    RIVAROXABAN  (XARELTO ) 20 MG TABS TABLET    Take 1 tablet by mouth daily  SCREENINGS               No data recorded         PHYSICAL EXAM      ED Triage Vitals [08/10/23 1322]   BP Systolic BP Percentile Diastolic BP Percentile Temp Temp Source Pulse Respirations SpO2   114/64 -- -- 98 F (36.7 C) Oral 92 19 95 %      Height Weight - Scale         1.854 m (6\' 1" ) 83.9 kg (185 lb)              Physical Exam  Vitals and nursing note reviewed.   Constitutional:       General: He is not in acute distress.     Appearance: Normal appearance. He is not ill-appearing.   Cardiovascular:      Rate and Rhythm: Normal rate and regular rhythm.      Heart sounds: No murmur heard.  Pulmonary:      Effort: Pulmonary effort is normal. No respiratory distress.      Breath sounds: Normal breath sounds. No wheezing.   Chest:      Chest wall: No tenderness.   Abdominal:      General: Abdomen is flat. There is no distension.      Palpations: Abdomen is soft.      Tenderness: There is no abdominal tenderness. There is no guarding or rebound.   Neurological:      General: No focal deficit present.      Mental Status: He is alert and oriented to person, place, and time.          DIAGNOSTIC RESULTS   LABS:     Labs Reviewed   CBC WITH AUTO  DIFFERENTIAL - Abnormal; Notable for the following components:       Result Value    Hemoglobin 12.0 (*)     All other components within normal limits   COMPREHENSIVE METABOLIC PANEL - Abnormal; Notable for the following components:    Chloride 113 (*)     BUN/Creatinine Ratio 25 (*)     Calcium  8.4 (*)     Total Protein 6.2 (*)     All other components within normal limits   TROPONIN   TROPONIN   BRAIN NATRIURETIC PEPTIDE   PHOSPHORUS   MAGNESIUM    LIPID PANEL   CBC   URINE DRUG SCREEN   D-DIMER, QUANTITATIVE          EKG: If performed, independent interpretation documented below in the MDM section     RADIOLOGY:  Non-plain film images such as CT, Ultrasound and MRI are read by the radiologist. Plain radiographic images are visualized and preliminarily interpreted by the ED Provider with the findings documented in the MDM section.     Interpretation per the Radiologist below, if available at the time of this note:     @[email or web address] @   XR CHEST PORTABLE  CTA CHEST W WO CONTRAST  CT HEAD WO CONTRAST  EKG 12-LEAD  EKG 12-LEAD  EKG 12-LEAD       PROCEDURES   Unless otherwise noted below, none  Procedures     CRITICAL CARE TIME   0    EMERGENCY DEPARTMENT COURSE and DIFFERENTIAL DIAGNOSIS/MDM   Vitals:    Vitals:    08/10/23 1545 08/10/23 1600 08/10/23 1807 08/10/23 1808   BP: 111/65 109/66 107/61    Pulse: 63 61 83 74   Resp: 17 14 20  17  Temp:       TempSrc:       SpO2:    94%   Weight:       Height:            Patient was given the following medications:  Medications   nitroGLYCERIN  (NITROSTAT ) SL tablet 0.4 mg (0.4 mg SubLINGual Not Given 08/10/23 1436)   sodium chloride  flush 0.9 % injection 5-40 mL (has no administration in time range)   sodium chloride  flush 0.9 % injection 5-40 mL (has no administration in time range)   0.9 % sodium chloride  infusion (has no administration in time range)   potassium chloride  (KLOR-CON  M) extended release tablet 40 mEq (has no administration in time range)     Or   potassium  bicarb-citric acid  (EFFER-K) effervescent tablet 40 mEq (has no administration in time range)     Or   potassium chloride  10 mEq/100 mL IVPB (Peripheral Line) (has no administration in time range)   magnesium  sulfate 2000 mg in 50 mL IVPB premix (has no administration in time range)   ondansetron  (ZOFRAN -ODT) disintegrating tablet 4 mg (has no administration in time range)     Or   ondansetron  (ZOFRAN ) injection 4 mg (has no administration in time range)   acetaminophen  (TYLENOL ) tablet 650 mg (has no administration in time range)     Or   acetaminophen  (TYLENOL ) suppository 650 mg (has no administration in time range)   polyethylene glycol (GLYCOLAX ) packet 17 g (has no administration in time range)   aspirin  chewable tablet 81 mg (has no administration in time range)   atorvastatin  (LIPITOR ) tablet 80 mg (has no administration in time range)   lisinopril  (PRINIVIL ;ZESTRIL ) tablet 10 mg (0 mg Oral Held 08/10/23 1809)   metoprolol  succinate (TOPROL  XL) extended release tablet 100 mg (has no administration in time range)   rivaroxaban  (XARELTO ) tablet 20 mg ( Oral Unheld by provider 08/10/23 1759)   oxyCODONE  (ROXICODONE ) immediate release tablet 5 mg (has no administration in time range)   iopamidol  (ISOVUE -370) 76 % injection 100 mL (100 mLs IntraVENous Given 08/10/23 1828)       Medical Decision Making  Amount and/or Complexity of Data Reviewed  External Data Reviewed: notes.  Labs: ordered. Decision-making details documented in ED Course.  Radiology: ordered and independent interpretation performed. Decision-making details documented in ED Course.  ECG/medicine tests: ordered and independent interpretation performed. Decision-making details documented in ED Course.  Discussion of management or test interpretation with external provider(s): Dr. Sherill Ding, hospitalist    Risk  Prescription drug management.  Decision regarding hospitalization.  Diagnosis or treatment significantly limited by social determinants of  health.  Risk Details: Unstable living situation, poor historian given underlying psych disease, history of leaving AMA.      53 year old male with history of CAD, CVA, schizophrenia presenting to the emergency department with left-sided chest pain described as pressure onset an hour prior to arrival with associated syncope.  Patient is a poor historian, he has also had admission for the same symptoms twice already in the past week but left with hospitals AMA, he was admitted in [place] and [place].  He states that he is in the process of moving from [place]  to [place] .  States that he is currently in 9/10 pain.  Radial pulses are equal bilaterally.  Differential diagnosis includes CAD, arrhythmia, dissection, GERD, vagal syncope.  Dissection considered less likely given stable vital signs, equal radial pulses and no ripping or tearing sensation with  radiation.  Will evaluate with serial high-sensitivity troponin, CBC, CMP, EKG, chest x-ray, continuous cardiac monitoring, treat with as needed sublingual nitroglycerin  and reassess.    Given chest pain with syncope will obtain CTPA to rule out PE.  Especially given recent travel down the [facility].    Patient is considered moderate risk for ACS by heart score, 4      ED Course as of 08/10/23 1853   Mon Aug 10, 2023   1314 Personally reviewed discharge summary record from 08/06/2023 when patient was admitted for chest pain with associated lightheadedness and syncope.  Details as history of CAD with multiple stents, history of CVA, hypertension, paroxysmal atrial fibrillation on Xarelto , history of schizophrenia.  He had negative troponins and unremarkable EKG.  Hospital plan to obtain stress test however patient decided to leave AMA prior to obtaining stress test. [AK]   1316 Personally reviewed H&P and discharge summary record from 08/07/23  Patient was admitted to [facility] for chest pain, he had negative troponin and was  discharged AMA prior to completion of observation admission. [AK]   1348 EKG per my interpretation normal sinus rhythm, rate 83 bpm, normal axis, no acute ischemic changes, no interval changes. [AK]   1350 Chest x-ray per my independent interpretation no acute process such as acute infiltrate or pneumothorax, confirmed by radiologist.   [AK]      ED Course User Index  [AK] Dorrine Gaudy, MD       TOBACCO COUNSELING:  Upon evaluation, the patient expressed that they are a current tobacco user. For 4 minutes, the patient has been counseled on the dangers of smoking and was encouraged to quit as soon as possible in order to decrease further risks to their health including heart disease, kidney disease, lung disease, and risk of MI/CVA. Patient has conveyed their understanding of the risks involved should they continue to use tobacco products. Patient has been offered various outpatient resources to help with their tobacco dependence including discussion with PCP for medication assistance and behavioral therapies.  Patient has expressed no interest in quitting.      Cardiac Monitoring:  The cardiac monitor revealed the following rhythm as interpreted by me: Normal Sinus Rhythm, rate 60 bpm  The cardiac monitor was ordered secondary to the patient's reported complaint of chest pain and to monitor the patient for dysrhythmia.  Dorrine Gaudy, MD      FINAL IMPRESSION     1. Chest pain, unspecified type    2. Syncope and collapse          DISPOSITION/PLAN       Admission Note:  Patient is being admitted to the hospital by Dr. Sherill Ding, Service: Hospitalist.  The results of their tests and reasons for their admission have been discussed with them and available family. They convey agreement and understanding for the need to be admitted and for their admission diagnosis.       CLINICAL IMPRESSION    1. Chest pain, unspecified type    2. Syncope and collapse         DISPOSITION  Admit        I am the Primary Clinician of Record.    Dorrine Gaudy, MD (electronically signed)    (Please note that parts of this dictation were completed with voice recognition software. Quite often unanticipated grammatical, syntax, homophones, and other interpretive errors are inadvertently transcribed by the computer software. Please disregards these errors. Please excuse any errors that  have escaped final proofreading.)       Dorrine Gaudy, MD  08/10/23 [phone]

## 2023-08-10 NOTE — H&P (Addendum)
 Hospitalist Admission Note    NAME:   Miguel Carr   DOB: 29-May-1970   MRN: [id number]     Date/Time: 08/10/2023 5:57 PM    Patient PCP: No primary care provider on file.    ______________________________________________________________________  Given the patient's current clinical presentation, I have a high level of concern for decompensation if discharged from the emergency department.  Complex decision making was performed, which includes reviewing the patient's available past medical records, laboratory results, and x-ray films.       My assessment of this patient's clinical condition and my plan of care is as follows.    Assessment / Plan:      NSTEMI eval  ACS eval  Syncope with collapse  PE rule out  -Symptoms of recurrent chest pain, syncope with collapse  -Has been worked up in [employer] in several states with Negative Findings, Patient Leaves AMA  -Last echo 08/05/2023 shows EF of 55% normal LV function, no regional wall motion abnormalities  -Risk factors that includes long distance travel, smoker  -CXR in the ED negative for acute pathology  - Troponins 5, repeat 6  -EKG shows normal sinus rhythm  - CBC negative for leukocytosis  - CMP negative for electrolyte derangement  - Will order CT head to rule out stroke  - CTA to rule out PE  - NSTEMI workup, lipid panel, A1c,  - Cards consult in the morning for possible nuclear stress test  -C/W home Xarelto  metoprolol   -PT OT    Syncope with collapse  - Recent CT head and neck were negative from outside hospital  - CTA to rule out PE  - CT head, PT OT, orthostatic vitals    Homelessness screening  Malingering eval  -Patient has visited multiple hospitals in  multiple states for the same symptoms with negative workup and left multiple times AMA  - There is suspicion the patient may be malingering, psych component or homelessness, that is driving the patient to present to the ED for these symptoms for shelter or drug abuse or unknown reason.  - Psych  consult  - Social work, case management consult in the morning    CAD  A-fib  HTN  Hx CVA  Hx MI  - C/W home medications, Xarelto  metoprolol  lisinopril         Medical Decision Making:   I personally reviewed labs: CBC BMP tropes  I personally reviewed imaging: CXR  I personally reviewed EKG: NSR  Toxic drug monitoring: N/A  Discussed case with: ED provider. After discussion I am in agreement that acuity of patient's medical condition necessitates hospital stay.      Code Status: Full code  DVT Prophylaxis: Xarelto   Baseline: Independent    Subjective:   CHIEF COMPLAINT: Chest pain, syncope    HISTORY OF PRESENT ILLNESS:     Miguel Carr is a 53 y.o.  male with PMHx significant for A-fib, HTN who comes in with complaints of chest pain and syncopal episode.  Patient reports that he had chest pain while walking to the pharmacy, subsequently had a syncopal episode.  Patient reports that when he had chest pain he rated 9 out of 10, sharp, radiates to his jaw and left arm.  Reports palpitation like his heart was racing, shortness of breath as well.  Also reported that he had his subsequent syncopal episode after that.  Denies any loss of consciousness or head trauma.  Does endorse shortness of breath prior to the syncopal episode.  Does endorse she has this chest pain in which she has been to several hospitals without any findings.  Patient does report that he was traveled from [place]  to to [place]  on a bus.    As of note, based on chart review, looks like the patient has visited multiple hospitals in multiple state for similar presentation with negative workup.  There is a concern the patient may be malingering, drug-seeking, or homeless that he uses these kind of tactics to get shelter, opioids, or unknown reasons.  We were asked to admit for work up and evaluation of the above problems.           Objective:   VITALS:    Patient Vitals for the past 24 hrs:   BP Temp Temp src Pulse Resp SpO2 Height Weight    08/10/23 1600 109/66 -- -- 61 14 -- -- --   08/10/23 1545 111/65 -- -- 63 17 -- -- --   08/10/23 1515 112/82 -- -- 72 15 -- -- --   08/10/23 1430 104/62 -- -- 75 17 97 % -- --   08/10/23 1425 -- -- -- 67 20 95 % -- --   08/10/23 1327 -- -- -- 90 -- -- -- --   08/10/23 1322 114/64 98 F (36.7 C) Oral 92 19 95 % 1.854 m (6\' 1" ) 83.9 kg (185 lb)       Temp (24hrs), Avg:98 F (36.7 C), Min:98 F (36.7 C), Max:98 F (36.7 C)        O2 Device: None (Room air)    Wt Readings from Last 12 Encounters:   08/10/23 83.9 kg (185 lb)   06/19/23 85.3 kg (188 lb)   06/17/23 85.3 kg (188 lb)   06/16/23 83.9 kg (185 lb)   04/22/16 83.6 kg (184 lb 6.4 oz)         PHYSICAL EXAM:  General:    Alert, cooperative, appears stated age.     Lungs:   CTA b/l.  No wheezing or Rhonchi. No rales.  Chest wall:  No tenderness.  No accessory muscle use.  Heart:   Regular  rhythm,  No  Murmur.   No edema  Abdomen:   Soft, NT. ND  BS+  Extremities: No cyanosis.  No clubbing,      Skin turgor normal, Radial dial pulse 2+. Capillary refill normal  Neurologic: No facial asymmetry. No aphasia or slurred speech. Symmetrical strength, Sensation grossly intact. AAOx4.     Past Medical History:   Diagnosis Date    Hypertension     Hypertension     Myocardial infarct [facility])     Myocardial infarct [facility]) 2013        History reviewed. No pertinent surgical history.    Social History     Tobacco Use    Smoking status: Never    Smokeless tobacco: Not on file   Substance Use Topics    Alcohol use: No        History reviewed. No pertinent family history.    Allergies   Allergen Reactions    Diphenhydramine Other (See Comments)     "I can take oral, not IV. My whole body locks up."    Metoclopramide Other (See Comments)     Muscle spasms    Ketorolac Tromethamine Rash    Tramadol Rash        Prior to Admission medications    Medication Sig Start Date End Date Taking? Authorizing Provider   metoprolol   succinate (TOPROL  XL) 100 MG extended release tablet Take 1  tablet by mouth daily 02/01/23  Yes [provider]   rivaroxaban  (XARELTO ) 20 MG TABS tablet Take 1 tablet by mouth daily 07/11/22  Yes [provider]   ondansetron  (ZOFRAN -ODT) 4 MG disintegrating tablet Take 1 tablet by mouth 3 times daily as needed for Nausea or Vomiting 06/16/23   Don Fritter, MD   nitroGLYCERIN  (NITROSTAT ) 0.4 MG SL tablet Take 0.4 mg by mouth 06/06/14   Automatic Reconciliation, Ar   atorvastatin  (LIPITOR ) 20 MG tablet Take 20 mg by mouth daily    [provider]   aspirin  81 MG chewable tablet Take 1 tablet by mouth daily    [provider]   lisinopril  (PRINIVIL ;ZESTRIL ) 10 MG tablet Take 10 mg by mouth daily     [provider]       LAB DATA REVIEWED:    Recent Results (from the past 12 hours)   EKG 12 Lead    Collection Time: 08/10/23  1:40 PM   Result Value Ref Range    Ventricular Rate 83 BPM    Atrial Rate 83 BPM    P-R Interval 124 ms    QRS Duration 100 ms    Q-T Interval 364 ms    QTc Calculation (Bazett) 427 ms    P Axis 16 degrees    R Axis 10 degrees    T Axis 4 degrees    Diagnosis       Normal sinus rhythm  Normal ECG  No previous ECGs available  Confirmed by Lurena Sally, MD, [place] [phone]) on 08/10/2023 1:49:17 PM     CBC with Auto Differential    Collection Time: 08/10/23  2:12 PM   Result Value Ref Range    WBC 4.5 4.1 - 11.1 K/uL    RBC 4.21 4.10 - 5.70 M/uL    Hemoglobin 12.0 (L) 12.1 - 17.0 g/dL    Hematocrit 29.5 28.4 - 50.3 %    MCV 89.1 80.0 - 99.0 FL    MCH 28.5 26.0 - 34.0 PG    MCHC 32.0 30.0 - 36.5 g/dL    RDW 13.2 44.0 - 10.2 %    Platelets 243 150 - 400 K/uL    MPV 9.1 8.9 - 12.9 FL    Nucleated RBCs 0.0 0 PER 100 WBC    nRBC 0.00 0.00 - 0.01 K/uL    Neutrophils % 66.7 32.0 - 75.0 %    Lymphocytes % 20.3 12.0 - 49.0 %    Monocytes % 9.5 5.0 - 13.0 %    Eosinophils % 2.4 0.0 - 7.0 %    Basophils % 0.7 0.0 - 1.0 %    Immature Granulocytes % 0.4 0.0 - 0.5 %    Neutrophils Absolute 3.03 1.80 - 8.00 K/UL    Lymphocytes Absolute  0.92 0.80 - 3.50 K/UL    Monocytes Absolute 0.43 0.00 - 1.00 K/UL    Eosinophils Absolute 0.11 0.00 - 0.40 K/UL    Basophils Absolute 0.03 0.00 - 0.10 K/UL    Immature Granulocytes Absolute 0.02 0.00 - 0.04 K/UL    Differential Type AUTOMATED     Comprehensive Metabolic Panel    Collection Time: 08/10/23  2:12 PM   Result Value Ref Range    Sodium 143 136 - 145 mmol/L    Potassium 4.0 3.5 - 5.1 mmol/L    Chloride 113 (H) 97 - 108 mmol/L    CO2 25 21 -  32 mmol/L    Anion Gap 5 2 - 12 mmol/L    Glucose 90 65 - 100 mg/dL    BUN 19 6 - 20 MG/DL    Creatinine 1.61 0.96 - 1.30 MG/DL    BUN/Creatinine Ratio 25 (H) 12 - 20      Est, Glom Filt Rate >90 >60 ml/min/1.8m2    Calcium  8.4 (L) 8.5 - 10.1 MG/DL    Total Bilirubin 0.2 0.2 - 1.0 MG/DL    ALT 28 12 - 78 U/L    AST 19 15 - 37 U/L    Alk Phosphatase 109 45 - 117 U/L    Total Protein 6.2 (L) 6.4 - 8.2 g/dL    Albumin 3.7 3.5 - 5.0 g/dL    Globulin 2.5 2.0 - 4.0 g/dL    Albumin/Globulin Ratio 1.5 1.1 - 2.2     Troponin    Collection Time: 08/10/23  2:12 PM   Result Value Ref Range    Troponin, High Sensitivity 5 0 - 76 ng/L   Troponin    Collection Time: 08/10/23  4:10 PM   Result Value Ref Range    Troponin, High Sensitivity 6 0 - 76 ng/L         XR CHEST PORTABLE  Result Date: 08/10/2023  EXAM: XR CHEST PORTABLE INDICATION: Chest Pain COMPARISON: None TECHNIQUE: Single view of the chest. FINDINGS: Cardiomediastinal silhouette: Within normal limits. Lungs: No focal airspace opacity. Pleura: No pleural effusion. No pneumothorax. Bones: No acute displaced fracture. Soft tissues: Prominent gaseous distention of the stomach and/or bowel in the visualized abdomen.     1. No findings of an acute cardiopulmonary process. 2. Prominent gaseous distention of the stomach and/or bowel in the visualized abdomen. Electronically signed by Arlana Labor    Chest AP Portable  Result Date: 08/06/2023  HISTORY: Chest pain COMPARISON: November 21 FINDINGS: Normal cardiac size. Calcified  atherosclerotic plaque in the aortic arch. No pneumothorax, consolidation, or pleural effusion. No evidence of pulmonary edema.    No acute abnormal findings. Jackolyn Masker, MD 08/06/2023 5:17 PM    TRANSTHORACIC ECHO (TTE) COMPLETE  Result Date: 08/05/2023    A complete transthoracic echocardiogram (including 2D, color flow Doppler, spectral Doppler and M-mode imaging) was performed using the standard protocol. The study quality was adequate.   The left ventricle is normal in size. Normal left ventricular wall thickness. Endocardium is incompletely visualized, but no regional wall motion abnormalities are noted in the visualized segments. Normal left ventricular ejection fraction. The left ventricular ejection fraction by visual estimate is 55% to 60%.   LV diastolic function parameters were not assessed.   The right ventricle is normal in size. There is normal function of the right ventricle.   The left atrium is normal in size.   The right atrium is normal in size.   The mitral valve is normal in structure. There is no mitral regurgitation. There is no mitral stenosis.   The aortic valve is normal in structure and trileaflet. There is no aortic stenosis. There is no aortic regurgitation.   The tricuspid valve is normal in structure. There is no tricuspid regurgitation.   There is no prior study available for comparison at this organization. In summary, overall normal left ventricular systolic function with estimated EF 55-60%.  No regional wall motion abnormalities noted within the limits of image quality.  Recommend repeat limited study with ultrasound enhancing agent to better assess for wall motion abnormalities if clinically indicated.  No  significant valve disease noted.  PA systolic pressure could not be estimated due to lack of significant TR jet. Left Ventricle The left ventricle is normal in size. Normal left ventricular wall thickness. Endocardium is incompletely visualized, but no regional wall motion  abnormalities are noted in the visualized segments. A repeat limited study with ultrasound enhancing (microbubble) agent may be indicated for wall motion and LVEF assessment. The left ventricular ejection fraction by visual estimate is 55% to 60%. Normal left ventricular ejection fraction. LV diastolic function parameters were not assessed. Right Ventricle The right ventricle is normal in size. There is normal function of the right ventricle. The tricuspid annular plane systolic excursion (TAPSE) is normal. Right ventricular TAPSE measures 2.4 cm. Right ventricular S' measures 11.5 cm/s. Left Atrium The left atrium is normal in size. Right Atrium The right atrium is normal in size. Mitral Valve The mitral valve is normal in structure. There is no mitral stenosis. There is no mitral regurgitation. Tricuspid Valve The tricuspid valve is normal in structure. There is no tricuspid stenosis. There is no tricuspid regurgitation. Aortic Valve The aortic valve is normal in structure and trileaflet. There is no aortic stenosis. There is no aortic regurgitation. Pulmonic Valve The pulmonic valve was not well visualized. There is no pulmonic stenosis. There is no pulmonic valve regurgitation. Pericardium No pericardial effusion. Septum No obvious interatrial communication noted with color Doppler imaging. Aortic Arch/Descending/Abdominal Aorta The aortic root is normal in size. The proximal segment of the ascending aorta is normal in size. Study Details A complete transthoracic echocardiogram (including 2D, color flow Doppler, spectral Doppler and M-mode imaging) was performed using the standard protocol. The study quality was adequate. Nuclear Prior Study There is no prior study available for comparison at this organization.    CT ABDOMEN/PELVIS W IV CONTRAST  Result Date: 08/05/2023  STUDY: ENHANCED CT OF THE ABDOMEN AND PELVIS CLINICAL INFORMATION:   History: dissection suspected dissection/chest pain PROCEDURE: Enhanced CT  examination of the abdomen and pelvis was performed after administration of intravenous contrast.   Intravenous contrast: 100 mL of IOPAMIDOL  76 % IV SOLN   Enteric contrast: None. COMPARISON: None. FINDINGS: LOWER THORAX: A separate chest CT has been performed and will be independently reported. LIVER: Normal in size and configuration. No suspicious mass. The portal veins are patent.      BILE DUCTS: No intrahepatic or extrahepatic bile duct dilation. GALLBLADDER: No calcified stones. Normal wall thickness. PANCREAS: Unremarkable. No main pancreatic duct dilation. SPLEEN: Unremarkable. ADRENAL GLANDS: Unremarkable. KIDNEYS/URETERS: No hydroureteronephrosis. No suspicious renal mass. BLADDER: Unremarkable. REPRODUCTIVE ORGANS: Normal size prostate. Symmetric seminal vesicles. BOWEL: No disproportionate dilation of the small or large bowel. The appendix is unremarkable. Probable small hiatal hernia. PERITONEUM/RETROPERITONEUM: No fluid collection, ascites, or pneumoperitoneum. LYMPH NODES: No abdominal or pelvic lymphadenopathy. VESSELS: No abdominal aortic aneurysm [ABAN00]. No findings suspicious for abdominal aortic dissection. No high grade disease of the proximal mesenteric arteries (SMA, celiac, or IMA). ABDOMINAL/PELVIC WALL: Unremarkable. BONES: No suspicious osseous lesions. NON-EMERGENT ACTIONABLE FINDINGS Recommendation: No specific recommendation. [REC0]    No acute abnormality in the abdomen or pelvis. ATTENDING PHYSICIAN ATTESTATION [[id number]]: I have personally reviewed the images and agree with this report.    CT CHEST W IV CONTRAST  Result Date: 08/05/2023  CLINICAL INFORMATION: Atrial fibrillation on Xarelto , coronary artery disease, prior myocardial infarction, prior stroke, now presenting with exertional chest pain and dyspnea, undergoing evaluation for suspected aortic dissection. PROCEDURE: Contrast enhanced CT of the chest was performed using thin  section reconstructions. INTRAVENOUS CONTRAST: 100  ml of IOPAMIDOL  76 % IV SOLN. COMPARISON: Chest radiograph 02/18/2021. FINDINGS: LUNGS, PLEURA: There is a small amount of mucus in the thoracic trachea. There is diffuse bronchial wall thickening along with foci of peripheral mucoid impaction, in keeping with bronchitis. There is mild bibasilar curvilinear atelectasis. No pulmonary consolidation or groundglass opacity is seen. There is a 4 mm juxtafissural solid nodule in the lingula on image 193 series 611 and a 5 mm juxtafissural solid nodule in the left lower lobe in image 171 series 611, in keeping with intrapulmonary lymph nodes. No pneumothorax is seen. No pleural effusion is noted. CARDIOVASCULAR, MEDIASTINUM, THYROID: The heart size is mildly enlarged, noting mild left atrial dilation. No pericardial effusion is seen. The central vasculature is patent, and of normal course and caliber. No thoracic aortic dissection or intramural hematoma is noted. There is atherosclerotic calcification of the thoracic aorta.   The visualized thyroid gland appears grossly normal. The esophagus is patulous and contains some fluid, which may be due to gastroesophageal reflux or esophageal dysmotility. There is a small hiatal hernia. LYMPH NODES: No thoracic lymphadenopathy is seen. SKELETON, CHEST WALL: There is mild degenerative change of the thoracic spine. Several old right-sided rib fractures are noted. No acute fractures or suspicious osseous lesions are seen. UPPER ABDOMEN: Please see the separate abdominal CT report from the same day for full details regarding the abdominal contents.    1.  Bronchitis. Mild cardiomegaly. 2.  No other acute or suspicious abnormality is seen in the chest. In particular, no thoracic aortic dissection or intramural hematoma is noted. Pulmonary nodule follow-up recommendation: [LN17N] Not Applicable.    CT CERVICAL SPINE WO IV CONTRAST  Result Date: 08/05/2023  CLINICAL INFORMATION: 53 year old patient on xarelto  reporting chest pain, followed  by syncopal event. Patient denies head strike. TECHNIQUE: Routine unenhanced CT of the head. Routine unenhanced CT of the cervical spine. COMPARISON: None available. FINDINGS: No acute intracranial hemorrhage, extracerebral fluid collection, midline shift or mass effect. No fracture of the calvarium, skull base and visualized maxillofacial bones. Probable chronic ischemic changes of the white matter. Hypodensity in the left aspect of the pons (series 2, image 13) is favored artifactual and not definitively seen on thin slice reconstruction. Intracranial atherosclerosis. Cerebral volume loss with enlargement of the ventricles. Asymmetric prominence of the left lateral ventricle. Visualized orbits are unremarkable. Visualized paranasal sinuses are essentially clear. Large volume debris seen within the bilateral external auditory canals, favored to reflect cerumen. Radiopaque linear density seen within the right external auditory canal, possibly a foreign body (series 607, image 10; series 604, image 72) Multiple dental caries. No acute fracture of the cervical spine. No traumatic malalignment of the cervical spine.   This examination does not assess for ligamentous injury or stability. No appreciable traumatic intraspinal abnormality by CT technique, noting this exam is not dedicated for intraspinal/cord evaluation.   Multilevel spondylotic changes of the cervical spine but no significant cervical spinal canal stenosis.  Multilevel disc height loss, worst at C5-C6 where there is also associated vacuum disc phenomenon. Diffuse disc bulge at C3-C4 and C4-C5 resulting in mild spinal canal stenosis. Visualized lung apices are unremarkable. The esophagus is patulous, which may be due to gastroesophageal reflux or esophageal dysmotility.    1. No acute intracranial hemorrhage. 2. No fracture or malalignment of the cervical spine. 3. Radiopaque linear density seen within the right external auditory canal, concern for foreign  body. Recommend direct visualization. ATTENDING PHYSICIAN ATTESTATION [  ATT05]: I have personally reviewed the images and agree with this report.    CT HEAD WO IV CONTRAST  Result Date: 08/05/2023  CLINICAL INFORMATION: 53 year old patient on xarelto  reporting chest pain, followed by syncopal event. Patient denies head strike. TECHNIQUE: Routine unenhanced CT of the head. Routine unenhanced CT of the cervical spine. COMPARISON: None available. FINDINGS: No acute intracranial hemorrhage, extracerebral fluid collection, midline shift or mass effect. No fracture of the calvarium, skull base and visualized maxillofacial bones. Probable chronic ischemic changes of the white matter. Hypodensity in the left aspect of the pons (series 2, image 13) is favored artifactual and not definitively seen on thin slice reconstruction. Intracranial atherosclerosis. Cerebral volume loss with enlargement of the ventricles. Asymmetric prominence of the left lateral ventricle. Visualized orbits are unremarkable. Visualized paranasal sinuses are essentially clear. Large volume debris seen within the bilateral external auditory canals, favored to reflect cerumen. Radiopaque linear density seen within the right external auditory canal, possibly a foreign body (series 607, image 10; series 604, image 72) Multiple dental caries. No acute fracture of the cervical spine. No traumatic malalignment of the cervical spine.   This examination does not assess for ligamentous injury or stability. No appreciable traumatic intraspinal abnormality by CT technique, noting this exam is not dedicated for intraspinal/cord evaluation.   Multilevel spondylotic changes of the cervical spine but no significant cervical spinal canal stenosis.  Multilevel disc height loss, worst at C5-C6 where there is also associated vacuum disc phenomenon. Diffuse disc bulge at C3-C4 and C4-C5 resulting in mild spinal canal stenosis. Visualized lung apices are unremarkable. The  esophagus is patulous, which may be due to gastroesophageal reflux or esophageal dysmotility.    1. No acute intracranial hemorrhage. 2. No fracture or malalignment of the cervical spine. 3. Radiopaque linear density seen within the right external auditory canal, concern for foreign body. Recommend direct visualization. ATTENDING PHYSICIAN ATTESTATION [[id number]]: I have personally reviewed the images and agree with this report.    XR CHEST 1 VIEW  Result Date: 08/03/2023  X-RAY CHEST 1 VW AP OR PA HISTORY: rapid Afib TECHNIQUE: Single view of the chest. COMPARISON: 05/26/2014      FINDINGS: Lines/tubes: None. Lungs: The lungs are clear. Pleura: There is no evidence of pleural effusion or pneumothorax. Heart and mediastinum: The heart and the mediastinum are normal for this technique. Bones: No acute abnormality.    IMPRESSION: No acute cardiopulmonary abnormality. RADCAT Grade:RADCAT2: Routine result. Report created by Alexa Andrews, MD   Date Read:08/03/2023 7:21 PM Belenda Bowie, MD has reviewed the report and all the images related to this patient encounter. Signing Doctor: Belenda Bowie, MD          Contributing Doctor:          Date Signed:08/03/2023 7:29 PM Patient ZOX:[id number]          Accession [id number] DOS:08/03/2023 6:31 PM          Exam:[id number] X-RAY CHEST 1 VW AP OR PA    CT ABDOMEN PELVIS WO CONTRAST  Result Date: 07/28/2023  CT ABDOMEN PELVIS WO CONTRAST Accession Number (s): [id number] Exam Date/Time: 07/28/2023 1:10 PM Reason For Exam: sudden onset right sided abd pain r/o stone CT ABDOMEN AND PELVIS WITHOUT CONTRAST AND WITH CORONAL AND SAGITTAL RECONSTRUCTIONS.  TECHNIQUE: Contiguous 3-mm axial images through the abdomen and pelvis, without contrast, with subsequent coronal and sagittal reconstructions. Automated exposure control, dose reduction, and iterative reconstruction techniques were used. COMPARISON: None. INDICATION: Sudden onset right-sided abdominal pain. FINDINGS: GASTROINTESTINAL  TRACT:  Unremarkable, including appendix. HEPATOBILIARY: Unremarkable. No biliary ductal dilatation. PANCREAS: Unremarkable. SPLEEN: Unremarkable. KIDNEYS, URETERS: Unremarkable. ADRENALS: Unremarkable. BLADDER: Unremarkable. REPRODUCTIVE: Unremarkable. VASCULAR: Very mild atherosclerosis, without aortic aneurysms. PERITONEAL CAVITY, RETROPERITONEUM: No free fluid, fluid collection, lymphadenopathy, or free air. LOWER THORAX: Unremarkable. MUSCULOSKELETAL: No suspicious bony lesions.    No acute intra-abdominal/intrapelvic pathology. Signed By: Clent Czar on 07/28/2023 1:49 PM on [id number]    CT Angiogram Chest PE : Arteriogram  Result Date: 07/23/2023  EXAMINATION: CTA CHEST WITH CONTRAST INDICATION: Chest pain.  Long bus trip 1 week ago.  On anticoagulation.  Patient also reporting pain radiating to his back.  Would like to prioritize PE study however would also like to visualize aorta if possible; TECHNIQUE: Axial multidetector CT images were obtained through the thorax after the uneventful administration of intravenous contrast including reformatted coronal, sagittal, thin slice axial, and oblique maximal intensity projection images. DOSE: Acquisition sequence:    1) Stationary Acquisition 2.0 s, 0.5 cm; CTDIvol = 12.1 mGy (Body) DLP = 6.1 mGy-cm    2) Spiral Acquisition 4.3 s, 32.4 cm; CTDIvol = 17.4 mGy (Body) DLP = 563.5 mGy-cm Total DLP (Body) = 570 mGy-cm COMPARISON: None. FINDINGS: VASCULATURE: No pulmonary embolism to the subsegmental level. Normal aortic caliber without acute abnormality. HEART: The heart is mildly enlarged.  No significant coronary artery or valvular calcifications.  No significant pericardial effusion. AXILLA, HILA, AND MEDIASTINUM: No lymphadenopathy or mass.  The esophagus is patulous with small amount of fluid seen within.Aaron Aas PLEURAL SPACES: No pleural effusion or pneumothorax. LUNGS: Mild bibasilar atelectasis. AIRWAYS: Patent central airways. BASE OF NECK: Unremarkable. ABDOMEN:  Patulous esophagus.  Visualized upper abdomen unremarkable. BONES: No suspicious osseous abnormality or acute fracture. SOFT TISSUES: Unremarkable.    No evidence of pulmonary embolism or aortic dissection. BY ELECTRONICALLY SIGNING THIS REPORT, I THE ATTENDING PHYSICIAN ATTEST THAT I HAVE REVIEWED THE IMAGES FOR THE ABOVE PROCEDURE(S) AND AGREE WITH THE FINDINGS AS DOCUMENTED. Barbera Lewandowsky, MD Cinderella Crea, MD, electronically signed on Jul 23 2023 05:24PM    XR CHEST (2 VW)  Result Date: 07/23/2023  EXAMINATION: Chest:  Frontal and lateral views INDICATION: chest pain; chest pain; TECHNIQUE: Chest:  Frontal and Lateral COMPARISON: None. FINDINGS: The lungs are clear without focal consolidation.  No pleural effusion or pneumothorax is seen. The cardiac and mediastinal silhouettes are unremarkable.    No acute cardiopulmonary process. Cinderella Crea, MD, electronically signed on Jul 23 2023 12:42PM    XR CHEST 1 VIEW  Result Date: 07/12/2023  EXAMINATION: XR CHEST CLINICAL INFORMATION: chest pain, shortness of breath COMPARISON: Chest radiograph 05/03/2016 TECHNIQUE: Frontal view of the chest FINDINGS: The lungs are adequately expanded. Bronchial wall thickening and increased reticular markings. No focal consolidation or pleural effusion. No pneumothorax. The cardiomediastinal silhouette is unchanged. No acute osseous abnormality.    No focal consolidation or pleural effusion. Bronchial wall thickening and diffuse reticular markings, differential including small airways disease/COPD, atypical infection, or interstitial pulmonary edema. Interpreted by:  Neta Baptist MD Radiology Resident I personally reviewed the images and the resident's preliminary report and AGREE with the report as it is now presented ([id number]).     _______________________________________________________________________    TOTAL TIME:  76 Minutes    Critical Care Provided     Minutes non procedure based    Signed: Rolanda Clever,  MD    Procedures: see electronic medical records for all procedures/Xrays and details which were not copied into this note but were reviewed  prior to creation of Plan.

## 2023-08-10 NOTE — ED Notes (Signed)
 Bedside and Verbal shift change report given to Maggie, RN (oncoming nurse) by Jenny Mohs, RN (offgoing nurse). Report included the following information Nurse Handoff Report, Index, ED Encounter Summary, ED SBAR, Adult Overview, Surgery Report, Intake/Output, MAR, Recent Results, Med Rec Status, Cardiac Rhythm NSR, Alarm Parameters, Pre Procedure Checklist, Procedure Verification, Quality Measures, Neuro Assessment, and Event Log.

## 2023-08-10 NOTE — ED Notes (Signed)
 Report given to [place], RN on Med Tele for bed 3243. Nurse was informed of reason for arrival, vitals, labs, medications, orders, procedures, results, anything left pending and current plan of action. Questions were asked and received prior to departure from the patient.

## 2023-08-10 NOTE — ED Notes (Signed)
 Patient provided with orange juice, graham crackers, and dinner.

## 2023-08-11 ENCOUNTER — Inpatient Hospital Stay
Admit: 2023-08-11 | Discharge: 2023-08-12 | Disposition: A | Discharge status: Other Acute Inpatient Hospital | Arrived: AM | Attending: Emergency Medicine

## 2023-08-11 ENCOUNTER — Emergency Department: Admit: 2023-08-11

## 2023-08-11 DIAGNOSIS — R079 Chest pain, unspecified: Secondary | ICD-10-CM

## 2023-08-11 LAB — CBC WITH AUTO DIFFERENTIAL
Basophils %: 0.5 % (ref 0.0–1.0)
Basophils Absolute: 0.03 10*3/uL (ref 0.00–0.10)
Eosinophils %: 2.2 % (ref 0.0–7.0)
Eosinophils Absolute: 0.14 10*3/uL (ref 0.00–0.40)
Hematocrit: 37 % (ref 36.6–50.3)
Hemoglobin: 12.4 g/dL (ref 12.1–17.0)
Immature Granulocytes %: 0.3 % (ref 0.0–0.5)
Immature Granulocytes Absolute: 0.02 10*3/uL (ref 0.00–0.04)
Lymphocytes %: 22.8 % (ref 12.0–49.0)
Lymphocytes Absolute: 1.44 10*3/uL (ref 0.80–3.50)
MCH: 29.2 pg (ref 26.0–34.0)
MCHC: 33.5 g/dL (ref 30.0–36.5)
MCV: 87.3 FL (ref 80.0–99.0)
MPV: 9.3 FL (ref 8.9–12.9)
Monocytes %: 9.4 % (ref 5.0–13.0)
Monocytes Absolute: 0.59 10*3/uL (ref 0.00–1.00)
Neutrophils %: 64.8 % (ref 32.0–75.0)
Neutrophils Absolute: 4.09 10*3/uL (ref 1.80–8.00)
Nucleated RBCs: 0 /100{WBCs}
Platelets: 292 10*3/uL (ref 150–400)
RBC: 4.24 M/uL (ref 4.10–5.70)
RDW: 13.9 % (ref 11.5–14.5)
WBC: 6.3 10*3/uL (ref 4.1–11.1)
nRBC: 0 10*3/uL (ref 0.00–0.01)

## 2023-08-11 LAB — COMPREHENSIVE METABOLIC PANEL
ALT: 27 U/L (ref 12–78)
AST: 23 U/L (ref 15–37)
Albumin/Globulin Ratio: 1.3 (ref 1.1–2.2)
Albumin: 3.7 g/dL (ref 3.5–5.0)
Alk Phosphatase: 107 U/L (ref 45–117)
Anion Gap: 5 mmol/L (ref 2–12)
BUN/Creatinine Ratio: 19 (ref 12–20)
BUN: 16 mg/dL (ref 6–20)
CO2: 24 mmol/L (ref 21–32)
Calcium: 8.6 mg/dL (ref 8.5–10.1)
Chloride: 112 mmol/L — ABNORMAL HIGH (ref 97–108)
Creatinine: 0.85 mg/dL (ref 0.70–1.30)
Est, Glom Filt Rate: >90 mL/min/{1.73_m2} (ref >60)
Globulin: 2.8 g/dL (ref 2.0–4.0)
Glucose: 91 mg/dL (ref 65–100)
Potassium: 4 mmol/L (ref 3.5–5.1)
Sodium: 141 mmol/L (ref 136–145)
Total Bilirubin: 0.3 mg/dL (ref 0.2–1.0)
Total Protein: 6.5 g/dL (ref 6.4–8.2)

## 2023-08-11 LAB — TROPONIN: Troponin, High Sensitivity: 6 ng/L (ref 0–76)

## 2023-08-11 LAB — EXTRA TUBES HOLD

## 2023-08-11 LAB — LIPID PANEL
Chol/HDL Ratio: 2.7 (ref 0.0–5.0)
Cholesterol, Total: 115 mg/dL (ref <200)
HDL: 43 mg/dL
LDL Cholesterol: 52.6 mg/dL (ref 0–100)
Triglycerides: 97 mg/dL (ref <150)
VLDL Cholesterol Calculated: 19.4 mg/dL

## 2023-08-11 LAB — URINE DRUG SCREEN
Amphetamine, Urine: NEGATIVE
Barbiturates, Urine: NEGATIVE
Benzodiazepines, Urine: NEGATIVE
Cocaine, Urine: NEGATIVE
Methadone, Urine: NEGATIVE
Opiates, Urine: POSITIVE — AB
Phencyclidine, Urine: NEGATIVE
THC, TH-Cannabinol, Urine: POSITIVE — AB

## 2023-08-11 LAB — D-DIMER, QUANTITATIVE: D-Dimer, Quant: 0.3 mg{FEU}/L (ref 0.00–0.65)

## 2023-08-11 MED ORDER — LIDOCAINE VISCOUS HCL 2 % MT SOLN
2 | Freq: Once | OROMUCOSAL | Status: AC
Start: 2023-08-11 — End: 2023-08-11
  Administered 2023-08-11: 40 mL via ORAL

## 2023-08-11 MED ORDER — SODIUM CHLORIDE 0.9 % IV BOLUS
0.9 | Freq: Once | INTRAVENOUS | Status: AC
Start: 2023-08-11 — End: 2023-08-11
  Administered 2023-08-11: 22:00:00 1000 mL via INTRAVENOUS

## 2023-08-11 MED ORDER — MORPHINE SULFATE (PF) 2 MG/ML IV SOLN
2 | Freq: Once | INTRAVENOUS | Status: AC | PRN
Start: 2023-08-11 — End: 2023-08-10
  Administered 2023-08-11: 02:00:00 2 mg via INTRAVENOUS

## 2023-08-11 MED ORDER — ACETAMINOPHEN 500 MG PO TABS
500 | ORAL | Status: AC
Start: 2023-08-11 — End: 2023-08-11
  Administered 2023-08-11: 22:00:00 1000 mg via ORAL

## 2023-08-11 MED FILL — MAG-AL PLUS 200-200-20 MG/5ML PO LIQD: 200-200-20 MG/5ML | ORAL | Qty: 30 | Fill #0

## 2023-08-11 MED FILL — ATORVASTATIN CALCIUM 40 MG PO TABS: 40 MG | ORAL | Qty: 2 | Fill #0

## 2023-08-11 MED FILL — ACETAMINOPHEN EXTRA STRENGTH 500 MG PO TABS: 500 MG | ORAL | Qty: 2 | Fill #0

## 2023-08-11 MED FILL — SODIUM CHLORIDE 0.9 % IV SOLN: 0.9 % | INTRAVENOUS | Qty: 1000 | Fill #0

## 2023-08-11 MED FILL — MORPHINE SULFATE 2 MG/ML IJ SOLN: 2 mg/mL | INTRAMUSCULAR | Qty: 1 | Fill #0

## 2023-08-11 NOTE — Progress Notes (Signed)
 Patient called out and stating that he was ready to go and wanted to leave. Told patient I would notify on call hospitalist to speak with him. While perfect serving on call NP, Patient walked out of his room dressed, with IV and telemetry removed. Asked patient to go back into room because NP was on the way to speak with him. He stated that he was not because she was just going to try and talk to him about staying and he did not want to deal with that. Patient walked down hall and proceeded to the elevators and left. Notified NP on call and nursing supervisor Edwina Gram.

## 2023-08-11 NOTE — ED Notes (Signed)
 Discharge instructions reviewed w/ the pt. Pt verbalized understanding of instructions and had no questions at the time of discharge.     Pt ambulatory at the time of discharge w/ no difficulty.

## 2023-08-11 NOTE — ED Provider Notes (Signed)
 [facility] EMERGENCY DEPARTMENT  EMERGENCY DEPARTMENT ENCOUNTER      Pt Name: Miguel Carr  MRN: [id number]  Birthdate 11-07-1970  Date of evaluation: 08/11/2023  Provider: Burnard Carrow, MD    CHIEF COMPLAINT       Chief Complaint   Patient presents with    Chest Pain         HISTORY OF PRESENT ILLNESS    Miguel Carr is a 52yo M with h/o HTN and CAD with MI in 2013.  He has frequent ED visits up and down the [facility] for complains of Chest pain, abdominal pain and anxiety. Was admitted to [facility] yesterday for chest pain and reported syncope evaluation had troponin x 2 that was stable and CTA chest PE evaluation that was normal as well as CT head that was normal.  Patient left AMA this morning. He notes this afternoon his chest pain returned and he felt lightheaded so sat down on the ground and called 911.           Additional history from independent historians:     Review of External Medical Records:     Nursing Notes were reviewed.    REVIEW OF SYSTEMS       Review of Systems    Except as noted above the remainder of the review of systems was reviewed and negative.       PAST MEDICAL HISTORY     Past Medical History:   Diagnosis Date    Hypertension     Hypertension     Myocardial infarct [facility])     Myocardial infarct [facility]) 2013         SURGICAL HISTORY     No past surgical history on file.      CURRENT MEDICATIONS       Previous Medications    ASPIRIN  81 MG CHEWABLE TABLET    Take 1 tablet by mouth daily    ATORVASTATIN  (LIPITOR ) 20 MG TABLET    Take 20 mg by mouth daily    LISINOPRIL  (PRINIVIL ;ZESTRIL ) 10 MG TABLET    Take 10 mg by mouth daily     METOPROLOL  SUCCINATE (TOPROL  XL) 100 MG EXTENDED RELEASE TABLET    Take 1 tablet by mouth daily    NITROGLYCERIN  (NITROSTAT ) 0.4 MG SL TABLET    Take 0.4 mg by mouth    ONDANSETRON  (ZOFRAN -ODT) 4 MG DISINTEGRATING TABLET    Take 1 tablet by mouth 3 times daily as needed for Nausea or Vomiting    RIVAROXABAN  (XARELTO ) 20 MG TABS TABLET    Take 1  tablet by mouth daily       ALLERGIES     Diphenhydramine, Metoclopramide, Ketorolac tromethamine, and Tramadol    FAMILY HISTORY     No family history on file.       SOCIAL HISTORY       Social History     Socioeconomic History    Marital status: Unknown   Tobacco Use    Smoking status: Never   Substance and Sexual Activity    Alcohol use: No    Drug use: No     Comment: used to medical marijuana for back pain     Social Drivers of Health     Financial Resource Strain: Low Risk  (07/28/2023)    Received from [facility]    Overall Financial Resource Strain (CARDIA)     Difficulty of Paying Living Expenses: Not hard at all   Food  Insecurity: No Food Insecurity (08/11/2023)    Hunger Vital Sign     Worried About Running Out of Food in the Last Year: Never true     Ran Out of Food in the Last Year: Never true   Recent Concern: Food Insecurity - Food Insecurity Present (08/05/2023)    Received from [facility]    Hunger Vital Sign     Worried About Running Out of Food in the Last Year: Sometimes true     Ran Out of Food in the Last Year: Sometimes true   Transportation Needs: No Transportation Needs (08/11/2023)    PRAPARE - Therapist, art (Medical): No     Lack of Transportation (Non-Medical): No   Recent Concern: Transportation Needs - Unmet Transportation Needs (08/05/2023)    Received from [facility]    PRAPARE - Transportation     Lack of Transportation (Medical): Yes     Lack of Transportation (Non-Medical): Yes    Received from [facility]    Social Connections   Intimate Partner Violence: Not At Risk (08/06/2023)    Received from [facility]    Humiliation, Afraid, Rape, and Kick questionnaire     Fear of Current or Ex-Partner: No     Emotionally Abused: No     Physically Abused: No     Sexually Abused: No   Housing Stability: Low Risk  (08/11/2023)    Housing Stability Vital Sign     Unable to Pay  for Housing in the Last Year: No     Number of Times Moved in the Last Year: 0     Homeless in the Last Year: No   Recent Concern: Housing Stability - High Risk (08/05/2023)    Received from [facility]    Housing Stability Vital Sign     Unable to Pay for Housing in the Last Year: Yes     Number of Times Moved in the Last Year: 5     Homeless in the Last Year: Yes         PHYSICAL EXAM       ED Triage Vitals [08/11/23 1710]   BP Systolic BP Percentile Diastolic BP Percentile Temp Temp Source Pulse Respirations SpO2   121/76 -- -- 98.1 F (36.7 C) Oral 80 19 94 %      Height Weight - Scale         1.854 m (6\' 1" ) 94.3 kg (208 lb)             Body mass index is 27.44 kg/m.    Physical Exam  Vitals and nursing note reviewed.   Constitutional:       General: He is not in acute distress.  HENT:      Head: Normocephalic and atraumatic.      Mouth/Throat:      Mouth: Mucous membranes are moist.   Eyes:      Extraocular Movements: Extraocular movements intact.      Conjunctiva/sclera: Conjunctivae normal.   Cardiovascular:      Rate and Rhythm: Normal rate and regular rhythm.   Pulmonary:      Effort: Pulmonary effort is normal. No respiratory distress.      Breath sounds: No wheezing.   Abdominal:      General: There is no distension.   Musculoskeletal:         General: No deformity.  Cervical back: Normal range of motion.   Skin:     General: Skin is warm and dry.   Neurological:      General: No focal deficit present.      Mental Status: He is alert.         DIAGNOSTIC RESULTS     EKG: All EKG's are interpreted by the Emergency Department Physician listed in the interpretation in the absence of a cardiologist and may also be found below under Reassessment/ED Course.    ED EKG Interpretation:  Time: 5:51 PM  Rhythm: normal sinus rhythm; and regular . Rate (approx.): 80; Axis: normal; P wave: normal; QRS interval: normal ; ST/T wave: normal; Other findings: Normal.  EKG interpreted by  Burnard Carrow, MD.        RADIOLOGY:   Non-plain film images such as CT, Ultrasound and MRI are read by the radiologist. Plain radiographic images are visualized and preliminarily interpreted by the emergency physician with the below findings:    Interpretation per the Radiologist below, if available at the time of this note:    XR CHEST PORTABLE   Final Result      No acute cardiopulmonary findings.      Electronically signed by Gaither Juba           LABS:  Labs Reviewed   COMPREHENSIVE METABOLIC PANEL - Abnormal; Notable for the following components:       Result Value    Chloride 112 (*)     All other components within normal limits   CBC WITH AUTO DIFFERENTIAL   TROPONIN   EXTRA TUBES HOLD   TROPONIN       All other labs were within normal range or not returned as of this dictation.    EMERGENCY DEPARTMENT COURSE and DIFFERENTIAL DIAGNOSIS/MDM:   Vitals:    Vitals:    08/11/23 1710 08/11/23 1715   BP: 121/76 126/79   Pulse: 80 80   Resp: 19 19   Temp: 98.1 F (36.7 C)    TempSrc: Oral    SpO2: 94% 94%   Weight: 94.3 kg (208 lb)    Height: 1.854 m (6\' 1" )            Medical Decision Making  Amount and/or Complexity of Data Reviewed  Labs: ordered.  Radiology: ordered.    Risk  OTC drugs.  Prescription drug management.            REASSESSMENT     ED Course as of 08/11/23 1743   Tue Aug 11, 2023   1714 ED EKG interpretation:  Rhythm: sinus rhythm. Rate (approx.): 80.  Axis: normal.  ST segment:  No concerning ST elevations or depressions. This EKG was interpreted by Miguel Hoist, MD,ED Physician. [JM]      ED Course User Index  [JM] Miguel Fu, MD     8:20 PM  Repeat troponin stable.  Patient complained of increased pain after eating spaghetti and orange juice provided by RN.  Given GI cocktail.  Reviewed workup form [facility] yesterday and CTA chest and ddimer normal.  Do not suspect PE.  Will discharge home.     CRITICAL CARE TIME       CONSULTS:  None    PROCEDURES:  Unless otherwise noted below,  none     Procedures        FINAL IMPRESSION      1. Chest pain, unspecified type          DISPOSITION/PLAN  DISPOSITION        PATIENT REFERRED TO:  [facility]  [address] [place]  [place]   [address]  [phone]    If symptoms worsen      DISCHARGE MEDICATIONS:  Discharge Medication List as of 08/11/2023  8:20 PM            (Please note that portions of this note were completed with a voice recognition program.  Efforts were made to edit the dictations but occasionally words are mis-transcribed.)    Burnard Carrow, MD (electronically signed)  Emergency Attending Physician            Miguel Keep, MD  08/11/23 2111

## 2023-08-11 NOTE — ED Notes (Signed)
 Xray tech at bedside.

## 2023-08-11 NOTE — ED Triage Notes (Addendum)
 Patient to ED via EMS for 8/10 chest pain which started while patient was shopping which started approx ago. Pt left AMA from [facility] this morning, was admitted for A Fib.     Hx MI and  A fib    EMS gave 325 ASA en route.

## 2023-08-12 LAB — EKG 12-LEAD
Atrial Rate: 80 {beats}/min
Diagnosis: NORMAL
P Axis: 49 degrees
P-R Interval: 132 ms
Q-T Interval: 370 ms
QRS Duration: 98 ms
QTc Calculation (Bazett): 426 ms
R Axis: 46 degrees
T Axis: 39 degrees
Ventricular Rate: 80 {beats}/min

## 2023-08-12 LAB — TROPONIN: Troponin, High Sensitivity: 7 ng/L (ref 0–76)

## 2023-08-13 NOTE — Progress Notes (Signed)
 AMBULATORY CARE MANAGEMENT Outreach Call    Hospital patient admitted to:  [facility] Admission/Discharge Dates:   5/22-23/2025     Reason for admission to hospital:  chest pain        Name: [name] [name]    ### Patient Details  Date of Birth: 05-07-1970  MRN: [id number]    ### Encounter Details  Arrival Date: 08/06/2023 04:14 PM EDT  Discharge Date: 08/07/2023 06:19 AM EDT  Encounter ID: [id number] TCM5/23/2025   6:19:00AM    ### Related interaction  [place] - TCM V2 Post Discharge Outreach (Post Discharge TCM V2 Outreach 1) ([email or web address]    ### Required Interventions and Feedback     Call Status         Call Status:     No Attempt (edited by GG on 08/13/2023 10:07 AM EDT)    No Issue:     Yes (edited by GG on 08/13/2023 10:07 AM EDT)    Comments::     Patient received an automated post-discharge outreach call.   1st attempt   08/11/2023 09:12 AM EDT 00:03:41 [employer] [phone] Primary Phone     2nd attempt   08/11/2023 12:10 PM EDT 00:00:45 Voice Mail - No Message Left [phone] Primary Phone     Third attempt   08/11/2023 03:05 PM EDT 00:02:11 Voice Mail - No Message Left [phone] Primary Phone     Unable to reach the patient, no further action/outreach needed at this time. (edited by GG on 08/13/2023 10:09 AM EDT)     General Status - Actions Taken         Other (Add Details in Comments):     Yes (edited by GG on 08/13/2023 10:09 AM EDT)    Comments:     Patient received an automated post-discharge outreach call.   1st attempt   08/11/2023 09:12 AM EDT 00:03:41 [employer] [phone] Primary Phone     2nd attempt   08/11/2023 12:10 PM EDT 00:00:45 Voice Mail - No Message Left [phone] Primary Phone     Third attempt   08/11/2023 03:05 PM EDT 00:02:11 Voice Mail - No Message Left [phone] Primary Phone     Unable to reach the patient, no further action/outreach needed at this time. (edited by GG on 08/13/2023 10:09 AM EDT)      Alisa LITTIE KANDICE EBER,  CCM  Case Manager  [facility]  [address], [place], [place] [address]

## 2023-08-24 LAB — CMP (EXT)
A/G Ratio (EXT): 1.9 (ref 1.1–2.2)
ALT/SGPT (EXT): 17 U/L (ref 7–52)
AST/SGOT (EXT): 19 U/L (ref 12–50)
Albumin (EXT): 4.2 g/dL (ref 3.0–5.0)
Alkaline Phosphatase (EXT): 102 U/L (ref 32–126)
Anion Gap (EXT): 13 (ref 8–16)
BUN (EXT): 13 mg/dL (ref 6–20)
BUN/CREAT Ratio (EXT): 22 — ABNORMAL HIGH (ref 12–20)
Bilirubin, Total (EXT): 0.3 mg/dL (ref 0.2–1.4)
CO2 (EXT): 22 mmol/L (ref 22–32)
CalciumCalcium (EXT): 8.8 mg/dL (ref 8.4–10.2)
Chloride (EXT): 106 mmol/L (ref 98–108)
Creatinine (EXT): 0.58 mg/dL (ref 0.50–1.20)
Glucose (EXT): 100 mg/dL (ref 70–100)
Potassium (EXT): 4 mmol/L (ref 3.3–5.1)
Protein (EXT): 6.4 g/dL (ref 5.9–8.4)
Sodium (EXT): 137 mmol/L (ref 133–145)
eGFR - Creat CKD-EPI (EXT): >60 mL/min/1.73sq m (ref >=60)

## 2023-08-31 LAB — CMP (EXT)
A/G Ratio (EXT): 1.6 (ref 1.1–2.2)
ALT/SGPT (EXT): 25 U/L (ref 0–49)
AST/SGOT (EXT): 31 U/L (ref 17–59)
Albumin (EXT): 4.7 g/dL (ref 3.5–5.0)
Alkaline Phosphatase (EXT): 106 U/L (ref 38–126)
Anion Gap (EXT): 13 (ref 10–20)
BUN (EXT): 15 mg/dL (ref 9–20)
BUN/CREAT Ratio (EXT): 23 — ABNORMAL HIGH (ref 12–20)
Bilirubin, Total (EXT): 0.3 mg/dL (ref 0.2–1.3)
CO2 (EXT): 20 mmol/L — ABNORMAL LOW (ref 22–30)
CalciumCalcium (EXT): 9.2 mg/dL (ref 8.4–10.2)
Chloride (EXT): 110 mmol/L — ABNORMAL HIGH (ref 98–107)
Creatinine (EXT): 0.65 mg/dL — ABNORMAL LOW (ref 0.66–1.25)
Glucose (EXT): 97 mg/dL (ref 74–100)
Potassium (EXT): 4.3 mmol/L (ref 3.5–5.1)
Protein (EXT): 7.6 g/dL (ref 6.3–8.2)
Sodium (EXT): 139 mmol/L (ref 137–145)
eGFR - Creat CKD-EPI (EXT): >60 mL/min/1.73sq m (ref >=60)

## 2023-09-01 LAB — CMP (EXT)
A/G Ratio (EXT): 1.8 (ref 1.1–2.2)
ALT/SGPT (EXT): 28 U/L (ref 0–50)
AST/SGOT (EXT): 34 U/L (ref 17–59)
Albumin (EXT): 4.5 g/dL (ref 3.5–5.0)
Alkaline Phosphatase (EXT): 110 U/L (ref 38–126)
Anion Gap (EXT): 15 (ref 10–20)
BUN (EXT): 13 mg/dL (ref 9–20)
BUN/CREAT Ratio (EXT): 22 — ABNORMAL HIGH (ref 12–20)
Bilirubin, Total (EXT): 0.2 mg/dL (ref 0.2–1.3)
CO2 (EXT): 21 mmol/L — ABNORMAL LOW (ref 22–30)
CalciumCalcium (EXT): 8.8 mg/dL (ref 8.4–10.2)
Chloride (EXT): 106 mmol/L (ref 98–107)
Creatinine (EXT): 0.58 mg/dL — ABNORMAL LOW (ref 0.66–1.25)
Glucose (EXT): 98 mg/dL (ref 70–110)
Potassium (EXT): 4 mmol/L (ref 3.5–5.1)
Protein (EXT): 7 g/dL (ref 6.3–8.2)
Sodium (EXT): 138 mmol/L (ref 137–145)
eGFR - Creat CKD-EPI (EXT): >60 mL/min/1.73sq m (ref >=60)

## 2023-09-03 LAB — CMP (EXT)
A/G Ratio (EXT): 1.7 (ref 1.1–2.2)
ALT/SGPT (EXT): 26 U/L (ref 0–50)
AST/SGOT (EXT): 36 U/L (ref 17–59)
Albumin (EXT): 4.1 g/dL (ref 3.5–5.0)
Alkaline Phosphatase (EXT): 90 U/L (ref 38–126)
Anion Gap (EXT): 14 (ref 10–20)
BUN (EXT): 13 mg/dL (ref 9–20)
BUN/CREAT Ratio (EXT): 19 (ref 12–20)
Bilirubin, Total (EXT): 0.3 mg/dL (ref 0.2–1.3)
CO2 (EXT): 23 mmol/L (ref 22–30)
CalciumCalcium (EXT): 8.9 mg/dL (ref 8.4–10.2)
Chloride (EXT): 108 mmol/L — ABNORMAL HIGH (ref 98–107)
Creatinine (EXT): 0.7 mg/dL (ref 0.66–1.25)
Glucose (EXT): 130 mg/dL — ABNORMAL HIGH (ref 70–110)
Potassium (EXT): 4.1 mmol/L (ref 3.5–5.1)
Protein (EXT): 6.5 g/dL (ref 6.3–8.2)
Sodium (EXT): 141 mmol/L (ref 137–145)
eGFR - Creat CKD-EPI (EXT): >60 mL/min/1.73sq m (ref >=60)

## 2023-09-04 LAB — CMP (EXT)
ALT/SGPT (EXT): 18 IU/L (ref 0–41)
AST/SGOT (EXT)T: 19 IU/L (ref 0–40)
Albumin (EXT): 4.4 g/dL (ref 3.5–5.2)
Alkaline Phosphatase (EXT): 121 IU/L (ref 40–129)
Anion Gap (EXT): 16 (ref 12–20)
BUN (EXT): 13 mg/dL (ref 6–20)
CO2 (EXT): 22 mmol/L (ref 22–29)
CalciumCalcium (EXT): 8.9 mg/dL (ref 8.6–10.0)
Chloride (EXT): 108 mmol/L — ABNORMAL HIGH (ref 98–107)
Creatinine (EXT): 0.71 mg/dL (ref 0.70–1.20)
Globulin (EXT): 2.1 g/dL — ABNORMAL LOW (ref 2.4–4.0)
Glucose (EXT): 109 mg/dL — ABNORMAL HIGH (ref 70–99)
Potassium (EXT): 3.8 mmol/L (ref 3.5–5.1)
Protein (EXT): 0.2 mg/dL (ref 0.0–1.2)
Protein (EXT): 6.5 g/dL (ref 6.4–8.3)
Sodium (EXT): 142 mmol/L (ref 136–145)
eGFR - Creat MDRD (EXT): 111 ml/min/1.73 m2 (ref >59)

## 2023-09-05 LAB — BMP (EXT)
Anion Gap (EXT): 18 (ref 12–20)
BUN (EXT): 11 mg/dL (ref 6–20)
CO2 (EXT): 21 mmol/L — ABNORMAL LOW (ref 22–29)
CalciumCalcium (EXT): 9.4 mg/dL (ref 8.6–10.0)
Chloride (EXT): 106 mmol/L (ref 98–107)
Creatinine (EXT): 0.73 mg/dL (ref 0.70–1.20)
Glucose (EXT): 101 mg/dL — ABNORMAL HIGH (ref 70–99)
Potassium (EXT): 5.1 mmol/L (ref 3.5–5.1)
Sodium (EXT): 140 mmol/L (ref 136–145)
eGFR - Creat MDRD (EXT): 110 ml/min/1.73 m2 (ref >59)

## 2023-09-09 LAB — CMP (EXT)
ALT/SGPT (EXT): 21 IU/L (ref 0–41)
AST/SGOT (EXT)T: 16 IU/L (ref 0–40)
Albumin (EXT): 4 g/dL (ref 3.5–5.2)
Alkaline Phosphatase (EXT): 109 IU/L (ref 40–129)
Anion Gap (EXT): 17 (ref 12–20)
BUN (EXT): 20 mg/dL (ref 6–20)
CO2 (EXT): 21 mmol/L — ABNORMAL LOW (ref 22–29)
CalciumCalcium (EXT): 8.6 mg/dL (ref 8.6–10.0)
Chloride (EXT): 107 mmol/L (ref 98–107)
Creatinine (EXT): 0.63 mg/dL — ABNORMAL LOW (ref 0.70–1.20)
Globulin (EXT): 2.6 g/dL (ref 2.4–4.0)
Glucose (EXT): 103 mg/dL — ABNORMAL HIGH (ref 70–99)
Potassium (EXT): 4.1 mmol/L (ref 3.5–5.1)
Protein (EXT): 6.6 g/dL (ref 6.4–8.3)
Protein (EXT): <0.2 mg/dL (ref 0.0–1.2)
Sodium (EXT): 141 mmol/L (ref 136–145)
eGFR - Creat MDRD (EXT): 115 ml/min/1.73 m2 (ref >59)

## 2023-09-19 LAB — CMP (EXT)
A/G Ratio (EXT): 1.6 (ref 1.1–2.2)
ALT/SGPT (EXT): 25 U/L (ref 21–72)
AST/SGOT (EXT): 28 U/L (ref 17–59)
Albumin (EXT): 4.1 g/dL (ref 3.5–5.0)
Alkaline Phosphatase (EXT): 88 U/L (ref 38–126)
Anion Gap (EXT): 11 (ref 7–16)
BUN (EXT): 16 mg/dL (ref 9–21)
BUN/CREAT Ratio (EXT): 23 (ref 6–25)
Bilirubin, Total (EXT): <0.1 mg/dL — ABNORMAL LOW (ref 0.2–1.3)
CO2 (EXT): 24 mmol/L (ref 22–30)
CalciumCalcium (EXT): 8.9 mg/dL (ref 8.4–10.2)
Chloride (EXT): 108 mmol/L — ABNORMAL HIGH (ref 98–107)
Creatinine (EXT): 0.69 mg/dL (ref 0.66–1.25)
Glucose (EXT): 98 mg/dL (ref 70–100)
Potassium (EXT): 4 mmol/L (ref 3.5–5.1)
Protein (EXT): 6.6 g/dL (ref 6.3–8.2)
Sodium (EXT): 139 mmol/L (ref 137–145)
eGFR - Creat CKD-EPI (EXT): >60 mL/min/1.73sq m (ref >=60)

## 2023-09-21 LAB — CMP (EXT)
A/G Ratio (EXT): 1.6 (ref 1.1–2.2)
ALT/SGPT (EXT): 24 U/L (ref 21–72)
AST/SGOT (EXT): 27 U/L (ref 17–59)
Albumin (EXT): 4.4 g/dL (ref 3.5–5.0)
Alkaline Phosphatase (EXT): 93 U/L (ref 38–126)
Anion Gap (EXT): 13 (ref 7–16)
BUN (EXT): 18 mg/dL (ref 9–21)
BUN/CREAT Ratio (EXT): 26 — ABNORMAL HIGH (ref 6–25)
Bilirubin, Total (EXT): 0.2 mg/dL (ref 0.2–1.3)
CO2 (EXT): 22 mmol/L (ref 22–30)
CalciumCalcium (EXT): 9.2 mg/dL (ref 8.4–10.2)
Chloride (EXT): 110 mmol/L — ABNORMAL HIGH (ref 98–107)
Creatinine (EXT): 0.69 mg/dL (ref 0.66–1.25)
Glucose (EXT): 94 mg/dL (ref 70–100)
Potassium (EXT): 4.5 mmol/L (ref 3.5–5.1)
Protein (EXT): 7.2 g/dL (ref 6.3–8.2)
Sodium (EXT): 140 mmol/L (ref 137–145)
eGFR - Creat CKD-EPI (EXT): >60 mL/min/1.73sq m (ref >=60)

## 2023-09-23 LAB — CMP (EXT)
A/G Ratio (EXT): 2 (ref 1.0–2.0)
ALT/SGPT (EXT): 17 U/L (ref 7–52)
AST/SGOT (EXT): 20 U/L (ref <39)
Albumin (EXT): 4 g/dL (ref 3.5–5.7)
Alkaline Phosphatase (EXT): 97 U/L (ref 35–125)
Anion Gap (EXT): 13 (ref 10–17)
BUN (EXT): 16 mg/dL (ref 7–25)
BUN/CREAT Ratio (EXT): 15 (ref 8–20)
Bilirubin, Total (EXT): 0.3 mg/dL (ref <=1.0)
CO2 (EXT): 21 mmol/L (ref 21–31)
CalciumCalcium (EXT): 8.3 mg/dL — ABNORMAL LOW (ref 8.6–10.3)
Chloride (EXT): 110 mmol/L — ABNORMAL HIGH (ref 98–107)
Creatinine (EXT): 1.06 mg/dL (ref 0.70–1.30)
Glucose (EXT): 106 mg/dL (ref 74–109)
Potassium (EXT): 4 mmol/L (ref 3.5–5.1)
Protein (EXT): 6 g/dL — ABNORMAL LOW (ref 6.4–8.9)
Sodium (EXT): 140 mmol/L (ref 136–145)
eGFR - Creat CKD-EPI (EXT): >60 mL/min/1.73sq m (ref >=60)

## 2024-02-11 LAB — BMP (EXT)
Anion Gap (EXT): 10 mmol/L (ref 4–15)
BUN (EXT): 12 mg/dL (ref 7–20)
CO2 (EXT): 18 mmol/L — ABNORMAL LOW (ref 22–30)
CalciumCalcium (EXT): 9.5 mg/dL (ref 8.4–10.2)
Chloride (EXT): 107 mmol/L (ref 98–109)
Creatinine (EXT): 0.71 mg/dL (ref 0.66–1.25)
Glucose (EXT): 101 mg/dL — ABNORMAL HIGH (ref 74–100)
Potassium (EXT): 4 mmol/L (ref 3.5–5.5)
Sodium (EXT): 135 mmol/L (ref 134–145)
eGFR - Creat MDRD (EXT): >90 mL/min/1.73 "square meters" (ref >=60)

## 2024-02-11 LAB — HEMOGLOBIN A1C
Estimated Average Glucose mg/dL (INT/EXT): 128.4 mg/dL
HEMOGLOBIN A1C % (INT/EXT): 6.1 % — ABNORMAL HIGH (ref 4.0–5.6)

## 2024-02-12 LAB — LIPID PROFILE (EXT)
Cholesterol (EXT): 155 mg/dL (ref 0–200)
HDL Cholesterol (EXT): 54 mg/dL (ref >=40)
LDL Cholesterol, CALC (EXT): 81 mg/dL (ref <=100)
NON HDL Cholesterol (EXT): 101 mg/dL (ref 0–189)
Triglycerides (EXT): 102 mg/dL (ref <=150)

## 2024-02-22 LAB — BMP (EXT)
Anion Gap (EXT): 11 mmol/L (ref 4–15)
BUN (EXT): 21 mg/dL — ABNORMAL HIGH (ref 7–20)
CO2 (EXT): 22 mmol/L (ref 22–30)
CalciumCalcium (EXT): 9 mg/dL (ref 8.4–10.2)
Chloride (EXT): 108 mmol/L (ref 98–109)
Creatinine (EXT): 0.73 mg/dL (ref 0.66–1.25)
Glucose (EXT): 115 mg/dL — ABNORMAL HIGH (ref 74–100)
Potassium (EXT): 4.3 mmol/L (ref 3.5–5.5)
Sodium (EXT): 141 mmol/L (ref 134–145)
eGFR - Creat MDRD (EXT): >90 mL/min/1.73 "square meters" (ref >=60)

## 2024-02-27 LAB — BMP (EXT)
Anion Gap (EXT): 13 mmol/L (ref 4–15)
BUN (EXT): 13 mg/dL (ref 7–20)
CO2 (EXT): 18 mmol/L — ABNORMAL LOW (ref 22–30)
CalciumCalcium (EXT): 8.7 mg/dL (ref 8.4–10.2)
Chloride (EXT): 106 mmol/L (ref 98–109)
Creatinine (EXT): 0.68 mg/dL (ref 0.66–1.25)
Glucose (EXT): 89 mg/dL (ref 74–100)
Potassium (EXT): 3.9 mmol/L (ref 3.5–5.5)
Sodium (EXT): 137 mmol/L (ref 134–145)
eGFR - Creat MDRD (EXT): >90 mL/min/1.73 "square meters" (ref >=60)

## 2024-03-02 LAB — BMP (EXT)
Anion Gap (EXT): 13 mmol/L (ref 4–15)
BUN (EXT): 17 mg/dL (ref 7–20)
CO2 (EXT): 19 mmol/L — ABNORMAL LOW (ref 22–30)
CalciumCalcium (EXT): 9.3 mg/dL (ref 8.4–10.2)
Chloride (EXT): 107 mmol/L (ref 98–109)
Creatinine (EXT): 0.78 mg/dL (ref 0.66–1.25)
Glucose (EXT): 81 mg/dL (ref 74–100)
Potassium (EXT): 4 mmol/L (ref 3.5–5.5)
Sodium (EXT): 139 mmol/L (ref 134–145)
eGFR - Creat MDRD (EXT): >90 mL/min/1.73 "square meters" (ref >=60)

## 2024-03-03 LAB — BMP (EXT)
Anion Gap (EXT): 9 mmol/L (ref 4–15)
BUN (EXT): 16 mg/dL (ref 7–20)
CO2 (EXT): 20 mmol/L — ABNORMAL LOW (ref 22–30)
CalciumCalcium (EXT): 9.4 mg/dL (ref 8.4–10.2)
Chloride (EXT): 109 mmol/L (ref 98–109)
Creatinine (EXT): 0.79 mg/dL (ref 0.66–1.25)
Glucose (EXT): 94 mg/dL (ref 74–100)
Potassium (EXT): 4.6 mmol/L (ref 3.5–5.5)
Sodium (EXT): 138 mmol/L (ref 134–145)
eGFR - Creat MDRD (EXT): >90 mL/min/1.73 "square meters" (ref >=60)

## 2024-03-07 LAB — BMP (EXT)
Anion Gap (EXT): 10 mmol/L (ref 4–15)
BUN (EXT): 19 mg/dL (ref 7–20)
CO2 (EXT): 20 mmol/L — ABNORMAL LOW (ref 22–30)
CalciumCalcium (EXT): 9 mg/dL (ref 8.4–10.2)
Chloride (EXT): 109 mmol/L (ref 98–109)
Creatinine (EXT): 0.85 mg/dL (ref 0.66–1.25)
Glucose (EXT): 114 mg/dL — ABNORMAL HIGH (ref 74–100)
Potassium (EXT): 4.6 mmol/L (ref 3.5–5.5)
Sodium (EXT): 139 mmol/L (ref 134–145)
eGFR - Creat MDRD (EXT): >90 mL/min/1.73 "square meters" (ref >=60)
